# Patient Record
Sex: Female | Born: 1950
Health system: Southern US, Community
[De-identification: ages and names within clinical notes are randomized; demographics above are authoritative.]

## PROBLEM LIST (undated history)

## (undated) DIAGNOSIS — F419 Anxiety disorder, unspecified: Secondary | ICD-10-CM

## (undated) DIAGNOSIS — J309 Allergic rhinitis, unspecified: Secondary | ICD-10-CM

## (undated) DIAGNOSIS — J45909 Unspecified asthma, uncomplicated: Secondary | ICD-10-CM

## (undated) DIAGNOSIS — D219 Benign neoplasm of connective and other soft tissue, unspecified: Secondary | ICD-10-CM

## (undated) DIAGNOSIS — Z86010 Personal history of colonic polyps: Principal | ICD-10-CM

## (undated) DIAGNOSIS — J302 Other seasonal allergic rhinitis: Secondary | ICD-10-CM

## (undated) DIAGNOSIS — M1990 Unspecified osteoarthritis, unspecified site: Secondary | ICD-10-CM

## (undated) DIAGNOSIS — R7303 Prediabetes: Secondary | ICD-10-CM

## (undated) DIAGNOSIS — Z9889 Other specified postprocedural states: Secondary | ICD-10-CM

## (undated) DIAGNOSIS — T7840XA Allergy, unspecified, initial encounter: Secondary | ICD-10-CM

## (undated) DIAGNOSIS — I10 Essential (primary) hypertension: Secondary | ICD-10-CM

## (undated) HISTORY — DX: Other seasonal allergic rhinitis: J30.2

## (undated) HISTORY — DX: Personal history of colonic polyps: Z86.010

## (undated) HISTORY — DX: Allergic rhinitis, unspecified: J30.9

## (undated) HISTORY — DX: Essential (primary) hypertension: I10

## (undated) HISTORY — PX: BREAST SURGERY: SHX581

## (undated) HISTORY — PX: TOE SURGERY: SHX1073

## (undated) HISTORY — PX: JOINT REPLACEMENT: SHX530

## (undated) HISTORY — DX: Allergy, unspecified, initial encounter: T78.40XA

## (undated) HISTORY — DX: Benign neoplasm of connective and other soft tissue, unspecified: D21.9

## (undated) HISTORY — PX: CYSTECTOMY: SUR359

---

## 1981-11-14 HISTORY — PX: BREAST EXCISIONAL BIOPSY: SUR124

## 1985-11-14 HISTORY — PX: BREAST EXCISIONAL BIOPSY: SUR124

## 1994-11-14 HISTORY — PX: TUBAL LIGATION: SHX77

## 1994-11-14 HISTORY — PX: ABDOMINAL HYSTERECTOMY: SHX81

## 1999-01-14 ENCOUNTER — Other Ambulatory Visit: Admission: RE | Admit: 1999-01-14 | Discharge: 1999-01-14 | Payer: Self-pay | Admitting: Obstetrics and Gynecology

## 1999-01-28 ENCOUNTER — Encounter: Payer: Self-pay | Admitting: Obstetrics and Gynecology

## 1999-01-28 ENCOUNTER — Ambulatory Visit (HOSPITAL_COMMUNITY): Admission: RE | Admit: 1999-01-28 | Discharge: 1999-01-28 | Payer: Self-pay | Admitting: Obstetrics and Gynecology

## 1999-02-18 ENCOUNTER — Ambulatory Visit (HOSPITAL_COMMUNITY): Admission: RE | Admit: 1999-02-18 | Discharge: 1999-02-18 | Payer: Self-pay

## 2000-02-28 ENCOUNTER — Other Ambulatory Visit: Admission: RE | Admit: 2000-02-28 | Discharge: 2000-02-28 | Payer: Self-pay | Admitting: Obstetrics and Gynecology

## 2000-02-29 ENCOUNTER — Ambulatory Visit (HOSPITAL_COMMUNITY): Admission: RE | Admit: 2000-02-29 | Discharge: 2000-02-29 | Payer: Self-pay | Admitting: Obstetrics and Gynecology

## 2000-02-29 ENCOUNTER — Encounter: Payer: Self-pay | Admitting: Obstetrics and Gynecology

## 2001-02-28 ENCOUNTER — Other Ambulatory Visit: Admission: RE | Admit: 2001-02-28 | Discharge: 2001-02-28 | Payer: Self-pay | Admitting: Obstetrics and Gynecology

## 2001-03-06 ENCOUNTER — Ambulatory Visit (HOSPITAL_COMMUNITY): Admission: RE | Admit: 2001-03-06 | Discharge: 2001-03-06 | Payer: Self-pay | Admitting: Obstetrics and Gynecology

## 2001-03-06 ENCOUNTER — Encounter: Payer: Self-pay | Admitting: Obstetrics and Gynecology

## 2002-09-09 ENCOUNTER — Other Ambulatory Visit: Admission: RE | Admit: 2002-09-09 | Discharge: 2002-09-09 | Payer: Self-pay | Admitting: Obstetrics and Gynecology

## 2002-09-23 ENCOUNTER — Encounter: Payer: Self-pay | Admitting: Obstetrics and Gynecology

## 2002-09-23 ENCOUNTER — Ambulatory Visit (HOSPITAL_COMMUNITY): Admission: RE | Admit: 2002-09-23 | Discharge: 2002-09-23 | Payer: Self-pay | Admitting: *Deleted

## 2003-12-02 ENCOUNTER — Ambulatory Visit (HOSPITAL_COMMUNITY): Admission: RE | Admit: 2003-12-02 | Discharge: 2003-12-02 | Payer: Self-pay | Admitting: Obstetrics and Gynecology

## 2004-05-28 ENCOUNTER — Other Ambulatory Visit: Admission: RE | Admit: 2004-05-28 | Discharge: 2004-05-28 | Payer: Self-pay | Admitting: Obstetrics and Gynecology

## 2005-02-07 ENCOUNTER — Ambulatory Visit: Payer: Self-pay | Admitting: Internal Medicine

## 2005-02-14 ENCOUNTER — Ambulatory Visit: Payer: Self-pay | Admitting: Internal Medicine

## 2005-02-23 ENCOUNTER — Ambulatory Visit: Payer: Self-pay | Admitting: Internal Medicine

## 2005-03-04 ENCOUNTER — Ambulatory Visit (HOSPITAL_COMMUNITY): Admission: RE | Admit: 2005-03-04 | Discharge: 2005-03-04 | Payer: Self-pay | Admitting: Obstetrics and Gynecology

## 2005-03-16 ENCOUNTER — Ambulatory Visit: Payer: Self-pay | Admitting: Internal Medicine

## 2005-03-23 ENCOUNTER — Ambulatory Visit: Payer: Self-pay | Admitting: Internal Medicine

## 2005-03-30 ENCOUNTER — Encounter (INDEPENDENT_AMBULATORY_CARE_PROVIDER_SITE_OTHER): Payer: Self-pay | Admitting: Specialist

## 2005-03-30 ENCOUNTER — Ambulatory Visit: Payer: Self-pay | Admitting: Internal Medicine

## 2005-06-15 ENCOUNTER — Other Ambulatory Visit: Admission: RE | Admit: 2005-06-15 | Discharge: 2005-06-15 | Payer: Self-pay | Admitting: Addiction Medicine

## 2005-06-20 ENCOUNTER — Ambulatory Visit: Payer: Self-pay | Admitting: Internal Medicine

## 2005-07-14 ENCOUNTER — Ambulatory Visit: Payer: Self-pay | Admitting: Internal Medicine

## 2005-12-13 ENCOUNTER — Ambulatory Visit: Payer: Self-pay | Admitting: Internal Medicine

## 2006-01-26 ENCOUNTER — Ambulatory Visit: Payer: Self-pay | Admitting: Internal Medicine

## 2006-01-29 ENCOUNTER — Encounter: Admission: RE | Admit: 2006-01-29 | Discharge: 2006-01-29 | Payer: Self-pay | Admitting: Internal Medicine

## 2006-02-02 ENCOUNTER — Ambulatory Visit: Payer: Self-pay | Admitting: Internal Medicine

## 2006-02-16 ENCOUNTER — Encounter: Admission: RE | Admit: 2006-02-16 | Discharge: 2006-04-14 | Payer: Self-pay | Admitting: Internal Medicine

## 2006-03-06 ENCOUNTER — Ambulatory Visit (HOSPITAL_COMMUNITY): Admission: RE | Admit: 2006-03-06 | Discharge: 2006-03-06 | Payer: Self-pay | Admitting: Obstetrics and Gynecology

## 2006-03-27 ENCOUNTER — Ambulatory Visit: Payer: Self-pay | Admitting: Internal Medicine

## 2006-06-23 ENCOUNTER — Ambulatory Visit: Payer: Self-pay | Admitting: Internal Medicine

## 2006-07-06 ENCOUNTER — Other Ambulatory Visit: Admission: RE | Admit: 2006-07-06 | Discharge: 2006-07-06 | Payer: Self-pay | Admitting: Obstetrics and Gynecology

## 2006-09-14 ENCOUNTER — Ambulatory Visit: Payer: Self-pay | Admitting: Internal Medicine

## 2006-09-14 DIAGNOSIS — J309 Allergic rhinitis, unspecified: Secondary | ICD-10-CM | POA: Insufficient documentation

## 2006-09-14 HISTORY — DX: Allergic rhinitis, unspecified: J30.9

## 2006-10-20 ENCOUNTER — Ambulatory Visit: Payer: Self-pay | Admitting: Internal Medicine

## 2006-10-20 LAB — CONVERTED CEMR LAB
ALT: 20 units/L (ref 0–40)
AST: 22 units/L (ref 0–37)
Albumin: 3.7 g/dL (ref 3.5–5.2)
Alkaline Phosphatase: 91 units/L (ref 39–117)
BUN: 6 mg/dL (ref 6–23)
Basophils Absolute: 0 10*3/uL (ref 0.0–0.1)
Basophils Relative: 0.3 % (ref 0.0–1.0)
CO2: 27 meq/L (ref 19–32)
Calcium: 10.1 mg/dL (ref 8.4–10.5)
Chloride: 107 meq/L (ref 96–112)
Chol/HDL Ratio, serum: 2.3
Cholesterol: 147 mg/dL (ref 0–200)
Creatinine, Ser: 0.8 mg/dL (ref 0.4–1.2)
Eosinophil percent: 2.6 % (ref 0.0–5.0)
GFR calc non Af Amer: 79 mL/min
Glomerular Filtration Rate, Af Am: 96 mL/min/{1.73_m2}
Glucose, Bld: 113 mg/dL — ABNORMAL HIGH (ref 70–99)
HCT: 41.1 % (ref 36.0–46.0)
HDL: 63.5 mg/dL (ref >39.0)
Hemoglobin: 13.8 g/dL (ref 12.0–15.0)
Hgb A1c MFr Bld: 6.5 % — ABNORMAL HIGH (ref 4.6–6.0)
LDL Cholesterol: 68 mg/dL (ref 0–99)
Lymphocytes Relative: 43.2 % (ref 12.0–46.0)
MCHC: 33.5 g/dL (ref 30.0–36.0)
MCV: 91.2 fL (ref 78.0–100.0)
Monocytes Absolute: 0.5 10*3/uL (ref 0.2–0.7)
Monocytes Relative: 8.4 % (ref 3.0–11.0)
Neutro Abs: 2.6 10*3/uL (ref 1.4–7.7)
Neutrophils Relative %: 45.5 % (ref 43.0–77.0)
Platelets: 330 10*3/uL (ref 150–400)
Potassium: 3.8 meq/L (ref 3.5–5.1)
RBC: 4.51 M/uL (ref 3.87–5.11)
RDW: 12.6 % (ref 11.5–14.6)
Sodium: 142 meq/L (ref 135–145)
TSH: 2.15 microintl units/mL (ref 0.35–5.50)
Total Bilirubin: 0.7 mg/dL (ref 0.3–1.2)
Total Protein: 6.6 g/dL (ref 6.0–8.3)
Triglyceride fasting, serum: 76 mg/dL (ref 0–149)
VLDL: 15 mg/dL (ref 0–40)
WBC: 5.6 10*3/uL (ref 4.5–10.5)

## 2006-11-01 ENCOUNTER — Ambulatory Visit: Payer: Self-pay | Admitting: Internal Medicine

## 2006-11-10 ENCOUNTER — Ambulatory Visit: Payer: Self-pay | Admitting: Internal Medicine

## 2006-11-14 HISTORY — PX: CARPAL TUNNEL RELEASE: SHX101

## 2006-11-22 ENCOUNTER — Encounter: Admission: RE | Admit: 2006-11-22 | Discharge: 2007-02-20 | Payer: Self-pay | Admitting: Internal Medicine

## 2007-01-31 ENCOUNTER — Ambulatory Visit: Payer: Self-pay | Admitting: Internal Medicine

## 2007-01-31 LAB — CONVERTED CEMR LAB
ALT: 13 units/L (ref 0–40)
AST: 18 units/L (ref 0–37)
Albumin: 3.6 g/dL (ref 3.5–5.2)
Alkaline Phosphatase: 87 units/L (ref 39–117)
BUN: 13 mg/dL (ref 6–23)
Bilirubin, Direct: 0.1 mg/dL (ref 0.0–0.3)
CO2: 30 meq/L (ref 19–32)
Calcium: 9.6 mg/dL (ref 8.4–10.5)
Chloride: 114 meq/L — ABNORMAL HIGH (ref 96–112)
Cholesterol: 141 mg/dL (ref 0–200)
Creatinine, Ser: 0.8 mg/dL (ref 0.4–1.2)
GFR calc Af Amer: 96 mL/min
GFR calc non Af Amer: 79 mL/min
Glucose, Bld: 118 mg/dL — ABNORMAL HIGH (ref 70–99)
HDL: 65.2 mg/dL (ref >39.0)
Hgb A1c MFr Bld: 6.4 % — ABNORMAL HIGH (ref 4.6–6.0)
LDL Cholesterol: 58 mg/dL (ref 0–99)
Potassium: 3.9 meq/L (ref 3.5–5.1)
Sodium: 149 meq/L — ABNORMAL HIGH (ref 135–145)
Total Bilirubin: 0.5 mg/dL (ref 0.3–1.2)
Total CHOL/HDL Ratio: 2.2
Total Protein: 6.5 g/dL (ref 6.0–8.3)
Triglycerides: 90 mg/dL (ref 0–149)
VLDL: 18 mg/dL (ref 0–40)

## 2007-03-08 ENCOUNTER — Ambulatory Visit (HOSPITAL_COMMUNITY): Admission: RE | Admit: 2007-03-08 | Discharge: 2007-03-08 | Payer: Self-pay | Admitting: Obstetrics and Gynecology

## 2007-06-15 ENCOUNTER — Telehealth (INDEPENDENT_AMBULATORY_CARE_PROVIDER_SITE_OTHER): Payer: Self-pay | Admitting: *Deleted

## 2007-08-13 ENCOUNTER — Other Ambulatory Visit: Admission: RE | Admit: 2007-08-13 | Discharge: 2007-08-13 | Payer: Self-pay | Admitting: Obstetrics and Gynecology

## 2007-09-07 ENCOUNTER — Ambulatory Visit: Payer: Self-pay | Admitting: Internal Medicine

## 2007-09-10 ENCOUNTER — Ambulatory Visit: Payer: Self-pay | Admitting: Internal Medicine

## 2008-03-10 ENCOUNTER — Ambulatory Visit (HOSPITAL_COMMUNITY): Admission: RE | Admit: 2008-03-10 | Discharge: 2008-03-10 | Payer: Self-pay | Admitting: Obstetrics and Gynecology

## 2008-05-23 ENCOUNTER — Ambulatory Visit: Payer: Self-pay | Admitting: Internal Medicine

## 2008-05-26 ENCOUNTER — Ambulatory Visit: Payer: Self-pay | Admitting: Internal Medicine

## 2008-05-26 DIAGNOSIS — M25569 Pain in unspecified knee: Secondary | ICD-10-CM | POA: Insufficient documentation

## 2008-05-29 ENCOUNTER — Encounter: Payer: Self-pay | Admitting: Internal Medicine

## 2008-06-06 ENCOUNTER — Telehealth (INDEPENDENT_AMBULATORY_CARE_PROVIDER_SITE_OTHER): Payer: Self-pay | Admitting: *Deleted

## 2008-07-18 ENCOUNTER — Ambulatory Visit (HOSPITAL_BASED_OUTPATIENT_CLINIC_OR_DEPARTMENT_OTHER): Admission: RE | Admit: 2008-07-18 | Discharge: 2008-07-18 | Payer: Self-pay | Admitting: Specialist

## 2008-08-20 ENCOUNTER — Ambulatory Visit: Payer: Self-pay | Admitting: Obstetrics and Gynecology

## 2008-08-20 ENCOUNTER — Encounter: Payer: Self-pay | Admitting: Obstetrics and Gynecology

## 2008-08-20 ENCOUNTER — Other Ambulatory Visit: Admission: RE | Admit: 2008-08-20 | Discharge: 2008-08-20 | Payer: Self-pay | Admitting: Obstetrics and Gynecology

## 2008-08-20 ENCOUNTER — Encounter: Payer: Self-pay | Admitting: Internal Medicine

## 2008-08-26 ENCOUNTER — Encounter: Payer: Self-pay | Admitting: Internal Medicine

## 2008-08-27 ENCOUNTER — Ambulatory Visit: Payer: Self-pay | Admitting: Obstetrics and Gynecology

## 2008-09-03 ENCOUNTER — Telehealth: Payer: Self-pay | Admitting: Internal Medicine

## 2008-11-14 HISTORY — PX: KNEE ARTHROSCOPY: SUR90

## 2008-11-14 LAB — HM MAMMOGRAPHY: HM Mammogram: NORMAL

## 2008-12-17 ENCOUNTER — Encounter: Payer: Self-pay | Admitting: Internal Medicine

## 2009-03-11 ENCOUNTER — Ambulatory Visit (HOSPITAL_COMMUNITY): Admission: RE | Admit: 2009-03-11 | Discharge: 2009-03-11 | Payer: Self-pay | Admitting: Obstetrics and Gynecology

## 2009-03-13 ENCOUNTER — Ambulatory Visit: Payer: Self-pay | Admitting: Internal Medicine

## 2009-03-17 ENCOUNTER — Telehealth (INDEPENDENT_AMBULATORY_CARE_PROVIDER_SITE_OTHER): Payer: Self-pay | Admitting: *Deleted

## 2009-08-06 ENCOUNTER — Ambulatory Visit: Payer: Self-pay | Admitting: Obstetrics and Gynecology

## 2009-08-28 ENCOUNTER — Telehealth: Payer: Self-pay | Admitting: Internal Medicine

## 2009-08-28 ENCOUNTER — Other Ambulatory Visit: Admission: RE | Admit: 2009-08-28 | Discharge: 2009-08-28 | Payer: Self-pay | Admitting: Obstetrics and Gynecology

## 2009-08-28 ENCOUNTER — Ambulatory Visit: Payer: Self-pay | Admitting: Obstetrics and Gynecology

## 2009-08-28 ENCOUNTER — Encounter: Payer: Self-pay | Admitting: Obstetrics and Gynecology

## 2009-09-02 ENCOUNTER — Ambulatory Visit: Payer: Self-pay | Admitting: Obstetrics and Gynecology

## 2009-11-03 ENCOUNTER — Ambulatory Visit: Payer: Self-pay | Admitting: Internal Medicine

## 2009-11-14 HISTORY — PX: TOTAL KNEE ARTHROPLASTY: SHX125

## 2010-01-08 ENCOUNTER — Telehealth: Payer: Self-pay | Admitting: Internal Medicine

## 2010-03-12 ENCOUNTER — Ambulatory Visit (HOSPITAL_COMMUNITY): Admission: RE | Admit: 2010-03-12 | Discharge: 2010-03-12 | Payer: Self-pay | Admitting: Obstetrics and Gynecology

## 2010-04-01 ENCOUNTER — Encounter (INDEPENDENT_AMBULATORY_CARE_PROVIDER_SITE_OTHER): Payer: Self-pay | Admitting: *Deleted

## 2010-05-21 ENCOUNTER — Telehealth: Payer: Self-pay | Admitting: Internal Medicine

## 2010-05-22 ENCOUNTER — Ambulatory Visit: Payer: Self-pay | Admitting: Family Medicine

## 2010-06-28 ENCOUNTER — Telehealth (INDEPENDENT_AMBULATORY_CARE_PROVIDER_SITE_OTHER): Payer: Self-pay | Admitting: *Deleted

## 2010-08-06 ENCOUNTER — Ambulatory Visit: Payer: Self-pay | Admitting: Internal Medicine

## 2010-08-06 LAB — CONVERTED CEMR LAB
Albumin: 3.8 g/dL (ref 3.5–5.2)
Alkaline Phosphatase: 65 units/L (ref 39–117)
Basophils Absolute: 0 10*3/uL (ref 0.0–0.1)
Bilirubin Urine: NEGATIVE
CO2: 28 meq/L (ref 19–32)
Calcium: 9.5 mg/dL (ref 8.4–10.5)
Cholesterol: 161 mg/dL (ref 0–200)
Creatinine, Ser: 0.7 mg/dL (ref 0.4–1.2)
Eosinophils Absolute: 0.6 10*3/uL (ref 0.0–0.7)
Glucose, Bld: 83 mg/dL (ref 70–99)
HDL: 71.9 mg/dL (ref >39.00)
Hemoglobin: 13.5 g/dL (ref 12.0–15.0)
Ketones, ur: NEGATIVE mg/dL
Lymphocytes Relative: 37.2 % (ref 12.0–46.0)
MCHC: 34.3 g/dL (ref 30.0–36.0)
Monocytes Absolute: 0.4 10*3/uL (ref 0.1–1.0)
Neutro Abs: 3.2 10*3/uL (ref 1.4–7.7)
RDW: 14 % (ref 11.5–14.6)
TSH: 2.1 microintl units/mL (ref 0.35–5.50)
Total CHOL/HDL Ratio: 2
Total Protein, Urine: NEGATIVE mg/dL
Triglycerides: 45 mg/dL (ref 0.0–149.0)
Urine Glucose: NEGATIVE mg/dL
pH: 5.5 (ref 5.0–8.0)

## 2010-08-16 ENCOUNTER — Encounter: Payer: Self-pay | Admitting: Internal Medicine

## 2010-08-16 ENCOUNTER — Ambulatory Visit: Payer: Self-pay | Admitting: Internal Medicine

## 2010-09-02 ENCOUNTER — Ambulatory Visit: Payer: Self-pay | Admitting: Obstetrics and Gynecology

## 2010-09-02 ENCOUNTER — Other Ambulatory Visit: Admission: RE | Admit: 2010-09-02 | Discharge: 2010-09-02 | Payer: Self-pay | Admitting: Obstetrics and Gynecology

## 2010-09-14 ENCOUNTER — Inpatient Hospital Stay (HOSPITAL_COMMUNITY): Admission: RE | Admit: 2010-09-14 | Discharge: 2010-09-17 | Payer: Self-pay | Admitting: Orthopedic Surgery

## 2010-09-20 ENCOUNTER — Encounter: Payer: Self-pay | Admitting: Internal Medicine

## 2010-12-14 NOTE — Progress Notes (Signed)
  Phone Note Other Incoming   Request: Send information Summary of Call: Request for records received from [facility]. Request forwarded to [facility].     

## 2010-12-14 NOTE — Assessment & Plan Note (Signed)
Summary: CPX/CJR   Vital Signs:  Patient profile:   60 year old female Height:      44.75 inches Weight:      168 pounds BMI:     59.20 Temp:     99.1 degrees F oral BP sitting:   138 / 92  Vitals Entered By: Kathrynn Speed CMA (August 16, 2010 8:11 AM) CC: cpx, src Is Patient Diabetic? No   Primary Care Provider:  [facility]  CC:  cpx and src.  History of Present Illness: CPX  Preventive Screening-Counseling & Management  Alcohol-Tobacco     Smoking Status: current     Packs/Day: <0.25  Current Problems (verified): 1)  Knee Pain, Right  (ICD-719.46) 2)  Well Adult Exam  (ICD-V70.0) 3)  Allergic Rhinitis  (ICD-477.9)  Current Medications (verified): 1)  Colace 100 Mg  Caps (Docusate Sodium) .... Qd 2)  Womens Multivitamin Plus   Tabs (Multiple Vitamins-Minerals) .... Qd 3)  Mobic 15 Mg Tabs (Meloxicam) .... 1/2 -1 By Mouth Once Daily As Needed 4)  Claritin 10 Mg Tabs (Loratadine) .Marland Kitchen.. 1 By Mouth Once Daily 5)  Th Cod Liver Oil  Caps (Cod Liver Oil) .... Once Daily  Allergies (verified): 1)  ! * Refuses Prednisone  Past History:  Past Medical History: Last updated: 09/14/2006 Allergic rhinitis Viral hepatitis 1985 Smoker  Past Surgical History: Last updated: 03/13/2009 Hysterectomy knee arthroscopy breast biopsy Carpal tunnel release toe surgery  Social History: Last updated: 09/07/2007 Current Smoker Regular exercise-no  Risk Factors: Exercise: no (09/07/2007)  Risk Factors: Smoking Status: current (08/16/2010) Packs/Day: <0.25 (08/16/2010)   Orders Added: 1)  EKG w/ Interpretation [93000] 2)  Est. Patient 40-64 years [99396] 3)  Admin 1st Vaccine [90471] 4)  Flu Vaccine 35yrs + [[address]]   Impression & Recommendations:  Problem # 1:  WELL ADULT EXAM (ICD-V70.0)  health maint UTD advised daily exercise and modestweight loss.   Orders: EKG w/ Interpretation (93000)  Problem # 2:  KNEE PAIN, RIGHT (ICD-719.46)  secondary to endstage  OA ok for surgery ekg done today Her updated medication list for this problem includes:    Mobic 15 Mg Tabs (Meloxicam) .Marland Kitchen... 1/2 -1 by mouth once daily as needed  Orders: EKG w/ Interpretation (93000) Flu Vaccine Consent Questions     Do you have a history of severe allergic reactions to this vaccine? no    Any prior history of allergic reactions to egg and/or gelatin? no    Do you have a sensitivity to the preservative Thimersol? no    Do you have a past history of Guillan-Barre Syndrome? no    Do you currently have an acute febrile illness? no    Have you ever had a severe reaction to latex? no    Vaccine information given and explained to patient? yes    Are you currently pregnant? no    Lot Number:[id number]   Exp Date:05/14/2011   Site Given  Left Deltoid IM  Complete Medication List: 1)  Colace 100 Mg Caps (Docusate sodium) .... Qd 2)  Womens Multivitamin Plus Tabs (Multiple vitamins-minerals) .... Qd 3)  Mobic 15 Mg Tabs (Meloxicam) .... 1/2 -1 by mouth once daily as needed 4)  Claritin 10 Mg Tabs (Loratadine) .Marland Kitchen.. 1 by mouth once daily 5)  Th Cod Liver Oil Caps (Cod liver oil) .... Once daily  Other Orders: Admin 1st Vaccine (60454) Flu Vaccine 64yrs + (09811)    Physical Exam General Appearance: well developed, well nourished, no acute  distress Eyes: conjunctiva and lids normal, PERRL, EOMI,  Ears, Nose, Mouth, Throat: TM clear, nares clear, oral exam WNL Neck: supple, no lymphadenopathy, no thyromegaly, no JVD Respiratory: clear to auscultation and percussion, respiratory effort normal Cardiovascular: regular rate and rhythm, S1-S2, no murmur, rub or gallop, no bruits, peripheral pulses normal and symmetric, no cyanosis, clubbing, edema or varicosities Chest: no scars, masses, tenderness; no asymmetry, skin changes, nipple discharge   Gastrointestinal: soft, non-tender; no hepatosplenomegaly, masses; active bowel sounds all quadrants, Lymphatic: no cervical,  axillary or inguinal adenopathy Musculoskeletal: gait normal, muscle tone and strength WNL, no joint swelling, effusions, discoloration, crepitus  Skin: clear, good turgor, color WNL, no rashes, lesions, or ulcerations Neurologic: normal mental status, normal reflexes, normal strength, sensation, and motion Psychiatric: alert; oriented to person, place and time Other Exam:

## 2010-12-14 NOTE — Letter (Signed)
Summary: Colonoscopy Date Change Letter  [facility]  [address] [place], [place] [address]   Phone: [phone]  Fax: [phone]      Apr 01, 2010 MRN: [id number]   Debra Woodward [address] [place], [place]  [address]   Dear Debra Woodward,   Previously you were recommended to have a repeat colonoscopy around this time. Your chart was recently reviewed by Dr. Iva Boop of [facility]. Follow up colonoscopy is now recommended in May 2016. This revised recommendation is based on current, nationally recognized guidelines for colorectal cancer screening and polyp surveillance. These guidelines are endorsed by the American Cancer Society, [employer] on Colorectal Cancer as well as numerous other major medical organizations.  Please understand that our recommendation assumes that you do not have any new symptoms such as bleeding, a change in bowel habits, anemia, or significant abdominal discomfort. If you do have any concerning GI symptoms or want to discuss the guideline recommendations, please call to arrange an office visit at your earliest convenience. Otherwise we will keep you in our reminder system and contact you 1-2 months prior to the date listed above to schedule your next colonoscopy.  Thank you,  Iva Boop, M.D.  [facility] [phone]

## 2010-12-14 NOTE — Assessment & Plan Note (Signed)
Summary: rash/njr   Vital Signs:  Patient profile:   60 year old female Height:      64.5 inches Weight:      167 pounds BMI:     28.32 O2 Sat:      95 % on Room air Pulse rate:   91 / minute BP sitting:   136 / 90  (left arm) Cuff size:   regular  Vitals Entered By: Payton Spark CMA (May 22, 2010 9:15 AM)  O2 Flow:  Room air CC: R knee warm and painful. Also c/o itchy rash.   History of Present Illness: Pt c/o rash for 1 week that started on L arm and L shoulder and to R breast.  It was very itchy  It has almost totally resolved but R breast---is still itchy.  Pt with chronic R Knee pain and sees Dr Shelle Iron for it but it has been bothering her more over the last couple of weeks.  Pt states it did feel warmer to her.   Today is not as bad.     Current Medications (verified): 1)  Colace 100 Mg  Caps (Docusate Sodium) .... Qd 2)  Womens Multivitamin Plus   Tabs (Multiple Vitamins-Minerals) .... Qd  Allergies (verified): 1)  ! * Refuses Prednisone  Physical Exam  General:  Well-developed,well-nourished,in no acute distress; alert,appropriate and cooperative throughout examination Msk:  R knee --- + swelling, ? effusion no pain with palpation no signs infection Skin:  few papules -- healing under R breast other papules resolved per pt Cervical Nodes:  No lymphadenopathy noted Psych:  Oriented X3 and normally interactive.     Impression & Recommendations:  Problem # 1:  SKIN RASH (ICD-782.1) pt did not want cortisone cream she will con't with claritn and corn starch and call if no better Discussed medication use and symptom control.   Problem # 2:  KNEE PAIN, RIGHT (ICD-719.46)  Her updated medication list for this problem includes:    Mobic 15 Mg Tabs (Meloxicam) .Marland Kitchen... 1/2 -1 by mouth once daily as needed  Discussed strengthening exercises, use of ice or heat, and medications.   Complete Medication List: 1)  Colace 100 Mg Caps (Docusate sodium) .... Qd 2)   Womens Multivitamin Plus Tabs (Multiple vitamins-minerals) .... Qd 3)  Mobic 15 Mg Tabs (Meloxicam) .... 1/2 -1 by mouth once daily as needed 4)  Claritin 10 Mg Tabs (Loratadine) .Marland Kitchen.. 1 by mouth once daily  Patient Instructions: 1)  f/u with PCP if no better by Monday 2)  If knee gets red or hot or more painful go to ER or UC 3)  F/u with ortho for your knees                                                                                                                                          Prescriptions: MOBIC 15 MG TABS (  MELOXICAM) 1/2 -1 by mouth once daily as needed  #30 x 2   Entered and Authorized by:   Loreen Freud DO   Signed by:   Loreen Freud DO on 05/22/2010   Method used:   Electronically to        [facility] [address] (retail)       [phone]       [place], [place]  [address]       Ph: [phone]       Fax: [phone]   RxID:   [id number]

## 2010-12-14 NOTE — Progress Notes (Signed)
Summary: LMTCB  Phone Note Call from Patient   Caller: Patient Call For: Birdie Sons MD Summary of Call: Requesting call back. Voice mail.  No message. [phone] Initial call taken by: Lynann Beaver CMA,  May 21, 2010 3:00 PM  Follow-up for Phone Call        [facility]  [facility] again\par Lynann Beaver [facility]  May 24, 2010 3:14 PM  Follow-up by: Lynann Beaver CMA,  May 21, 2010 3:01 PM  Additional Follow-up for Phone Call Additional follow up Details #1::        Pt was seen Saturday, and does not need appt with [place] right now. Additional Follow-up by: Lynann Beaver CMA,  May 24, 2010 4:28 PM

## 2010-12-14 NOTE — Progress Notes (Signed)
Summary: permament sticker  [facility] 01/11/2010  Phone Note Call from Patient   Caller: Patient Call For: Birdie Sons MD Summary of Call: Pt wants to know if she brings in a permanent handicapped sticker if Dr. Cato Mulligan will approve it?  She has an temporary sticker now. Initial call taken by: Lynann Beaver CMA,  January 08, 2010 9:06 AM  Follow-up for Phone Call        what is the indication? Follow-up by: Birdie Sons MD,  January 11, 2010 6:58 AM  Additional Follow-up for Phone Call Additional follow up Details #1::        LMTCB Additional Follow-up by: Lynann Beaver CMA,  January 11, 2010 11:00 AM    Additional Follow-up for Phone Call Additional follow up Details #2::    pt returniong a call.......[phone] Follow-up by: Warnell Forester,  January 12, 2010 12:14 PM  Additional Follow-up for Phone Call Additional follow up Details #3:: Details for Additional Follow-up Action Taken: Left message on machine. Pt to call back. Pt to leave indication for this on my voice mail.Gladis Riffle, RN  January 12, 2010 4:58 PM   Pt states needs due to knee you took fluid off in Dec.  won't qualify Birdie Sons MD  January 13, 2010 2:28 PM  Additional Follow-up by: Gladis Riffle, RN,  January 13, 2010 9:35 AM    Patient notified.

## 2010-12-28 ENCOUNTER — Encounter: Payer: Self-pay | Admitting: Internal Medicine

## 2010-12-28 ENCOUNTER — Ambulatory Visit (INDEPENDENT_AMBULATORY_CARE_PROVIDER_SITE_OTHER): Payer: BC Managed Care – PPO | Admitting: Internal Medicine

## 2010-12-28 VITALS — BP 132/84 | HR 76 | Temp 98.6°F | Ht 64.5 in | Wt 170.0 lb

## 2010-12-28 DIAGNOSIS — R42 Dizziness and giddiness: Secondary | ICD-10-CM

## 2010-12-28 MED ORDER — MECLIZINE HCL 25 MG PO TABS: 25.0000 mg | ORAL_TABLET | Freq: Three times a day (TID) | ORAL | Status: DC | PRN

## 2010-12-28 NOTE — Progress Notes (Signed)
  Subjective:    Patient ID: Debra Woodward, female    DOB: [birth year], 60 y.o.   MRN: [id number]  HPI  Pt complains of dizziness. Only occurs when she changes head position; leaning over or turning head rapidly. Duration 3 days. No fever  Or other constitiutional sxs. No neurologic sxs. sxs can be severe, minimal nausea.  She did take her bp a few times after the dizziness, bp ranged from 150-168/85-104  Review of Systems  patient denies chest pain, shortness of breath, orthopnea. Denies lower extremity edema, abdominal pain, change in appetite, change in bowel movements. Patient denies rashes, musculoskeletal complaints. No other specific complaints in a complete review of systems.    Past Medical History  Diagnosis Date  . ALLERGIC RHINITIS 09/14/2006   Past Surgical History  Procedure Date  . Abdominal hysterectomy   . Knee arthroscopy   . Breast surgery   . Carpal tunnel release   . Toe surgery     reports that she quit smoking about 3 months ago. She does not have any smokeless tobacco history on file. She reports that she drinks about .6 ounces of alcohol per week. Her drug history not on file. family history includes Diabetes in her father; Hypertension in her sister; and Stroke in her mother.        Objective:   Physical Exam   Well-developed well-nourished female in no acute distress. HEENT exam atraumatic, normocephalic, extraocular muscles are intact, no nystagmus Neck is supple. No jugular venous distention no thyromegaly. Chest clear to auscultation without increased work of breathing. Cardiac exam S1 and S2 are regular. Abdominal exam active bowel sounds, soft, nontender. Extremities no edema. Neurologic exam she is alert without any motor sensory deficits. Gait is normal.          Assessment & Plan:  vertigo: she gives a good definition of vertigo.  I discussed this with her. Discussed treatment plans. We'll try meclizine. Side effects discussed. She will call  me if symptoms persist and may need to refer to physical therapy.

## 2011-01-06 ENCOUNTER — Telehealth: Payer: Self-pay | Admitting: *Deleted

## 2011-01-06 NOTE — Telephone Encounter (Signed)
Pt asked Dr Cato Mulligan for a permanent handicapp sticker and he told her to as [employer].  They told her they only do temporary and she needed to go to her PCP for permanent

## 2011-01-25 LAB — CBC
MCH: 32 pg (ref 26.0–34.0)
MCHC: 34.6 g/dL (ref 30.0–36.0)
MCV: 92.7 fL (ref 78.0–100.0)
Platelets: 217 10*3/uL (ref 150–400)
Platelets: 244 10*3/uL (ref 150–400)
RBC: 3.15 MIL/uL — ABNORMAL LOW (ref 3.87–5.11)
RBC: 3.38 MIL/uL — ABNORMAL LOW (ref 3.87–5.11)
RDW: 13.3 % (ref 11.5–15.5)
RDW: 13.4 % (ref 11.5–15.5)
WBC: 11.5 10*3/uL — ABNORMAL HIGH (ref 4.0–10.5)

## 2011-01-25 LAB — BASIC METABOLIC PANEL
BUN: 5 mg/dL — ABNORMAL LOW (ref 6–23)
BUN: 5 mg/dL — ABNORMAL LOW (ref 6–23)
CO2: 24 mEq/L (ref 19–32)
Calcium: 8.9 mg/dL (ref 8.4–10.5)
Calcium: 9 mg/dL (ref 8.4–10.5)
Chloride: 106 mEq/L (ref 96–112)
Creatinine, Ser: 0.66 mg/dL (ref 0.4–1.2)
Creatinine, Ser: 0.72 mg/dL (ref 0.4–1.2)
GFR calc Af Amer: >60 mL/min (ref >60)
GFR calc Af Amer: >60 mL/min (ref >60)
Glucose, Bld: 159 mg/dL — ABNORMAL HIGH (ref 70–99)

## 2011-01-25 LAB — TYPE AND SCREEN
ABO/RH(D): O POS
Antibody Screen: NEGATIVE

## 2011-01-25 LAB — ABO/RH: ABO/RH(D): O POS

## 2011-01-26 LAB — CBC
MCV: 93.7 fL (ref 78.0–100.0)
Platelets: 313 10*3/uL (ref 150–400)
RBC: 4.51 MIL/uL (ref 3.87–5.11)
RDW: 13.3 % (ref 11.5–15.5)
WBC: 6.7 10*3/uL (ref 4.0–10.5)

## 2011-01-26 LAB — BASIC METABOLIC PANEL
BUN: 10 mg/dL (ref 6–23)
Calcium: 9.6 mg/dL (ref 8.4–10.5)
Chloride: 107 mEq/L (ref 96–112)
Creatinine, Ser: 0.8 mg/dL (ref 0.4–1.2)
GFR calc Af Amer: >60 mL/min (ref >60)
GFR calc non Af Amer: >60 mL/min (ref >60)

## 2011-01-26 LAB — DIFFERENTIAL
Basophils Absolute: 0 10*3/uL (ref 0.0–0.1)
Eosinophils Relative: 8 % — ABNORMAL HIGH (ref 0–5)
Lymphocytes Relative: 39 % (ref 12–46)
Lymphs Abs: 2.6 10*3/uL (ref 0.7–4.0)
Neutrophils Relative %: 46 % (ref 43–77)

## 2011-01-26 LAB — URINALYSIS, ROUTINE W REFLEX MICROSCOPIC
Glucose, UA: NEGATIVE mg/dL
Hgb urine dipstick: NEGATIVE
Ketones, ur: NEGATIVE mg/dL
Protein, ur: NEGATIVE mg/dL
Urobilinogen, UA: 1 mg/dL (ref 0.0–1.0)

## 2011-01-26 LAB — SURGICAL PCR SCREEN: Staphylococcus aureus: NEGATIVE

## 2011-01-26 LAB — PROTIME-INR
INR: 0.94 (ref 0.00–1.49)
Prothrombin Time: 12.8 seconds (ref 11.6–15.2)

## 2011-01-26 LAB — URINE MICROSCOPIC-ADD ON

## 2011-02-24 ENCOUNTER — Other Ambulatory Visit: Payer: Self-pay | Admitting: Obstetrics and Gynecology

## 2011-02-24 DIAGNOSIS — Z1231 Encounter for screening mammogram for malignant neoplasm of breast: Secondary | ICD-10-CM

## 2011-03-16 ENCOUNTER — Ambulatory Visit (HOSPITAL_COMMUNITY)
Admission: RE | Admit: 2011-03-16 | Discharge: 2011-03-16 | Disposition: A | Discharge status: Home / Self Care | Payer: BC Managed Care – PPO | Source: Ambulatory Visit | Attending: Obstetrics and Gynecology | Admitting: Obstetrics and Gynecology

## 2011-03-16 DIAGNOSIS — Z1231 Encounter for screening mammogram for malignant neoplasm of breast: Secondary | ICD-10-CM | POA: Insufficient documentation

## 2011-03-17 ENCOUNTER — Other Ambulatory Visit: Payer: Self-pay | Admitting: Obstetrics and Gynecology

## 2011-03-17 DIAGNOSIS — R928 Other abnormal and inconclusive findings on diagnostic imaging of breast: Secondary | ICD-10-CM

## 2011-03-22 ENCOUNTER — Other Ambulatory Visit: Payer: Self-pay | Admitting: Obstetrics and Gynecology

## 2011-03-22 ENCOUNTER — Ambulatory Visit
Admission: RE | Admit: 2011-03-22 | Discharge: 2011-03-22 | Disposition: A | Discharge status: Home / Self Care | Payer: BC Managed Care – PPO | Source: Ambulatory Visit | Attending: Obstetrics and Gynecology | Admitting: Obstetrics and Gynecology

## 2011-03-22 DIAGNOSIS — R921 Mammographic calcification found on diagnostic imaging of breast: Secondary | ICD-10-CM

## 2011-03-22 DIAGNOSIS — R928 Other abnormal and inconclusive findings on diagnostic imaging of breast: Secondary | ICD-10-CM

## 2011-03-29 NOTE — Op Note (Signed)
Debra Woodward, Debra Woodward NO.:  [id number]   MEDICAL RECORD NO.:  [id number]          PATIENT TYPE:  AMB   LOCATION:  [facility]                         FACILITY:  [facility]   PHYSICIAN:  Jene Every, M.D.    DATE OF BIRTH:  17-May-1951   DATE OF PROCEDURE:  07/18/2008  DATE OF DISCHARGE:                               OPERATIVE REPORT   PREOPERATIVE DIAGNOSIS:  Lateral meniscus tear of the right knee.   POSTOPERATIVE DIAGNOSIS:  Lateral meniscus tear of the right knee,  medial meniscus tear, grade III chondromalacia of patella, grade III  chondromalacia of lateral compartment.   PROCEDURE:  Right knee arthroscopy, partial medial and lateral  meniscectomy, chondroplasty of the patella and lateral tibial plateau.   ANESTHESIA:  General.   BRIEF HISTORY AND INDICATION:  A 60 year old with knee pain and lateral  meniscus tear seen on MRI, indicated for arthroscopic debridement and  partial meniscectomy.  Risks and benefits discussed including bleeding,  infection, no change in symptoms, worsening symptoms, need for repeat  debridement, DVT, PE, anesthetic complications, etc.   PROCEDURE IN DETAIL:  With the patient in supine position after the  induction of adequate general anesthesia and 1 gram of Kefzol the right  lower extremity was prepped and draped in the usual sterile fashion.  A  lateral parapatellar portal and a superomedial parapatellar portal was  fashioned with a #11 blade.  The ingress cannula atraumatically placed.  Irrigant was utilized to insufflate the joint.  Under direct  visualization a medial parapatellar portal was fashioned with a #11  blade after localization with an 18 gauge needle sparing the medial  meniscus.   Noted was a posterior third radial tear of the medial meniscus and a  basket rongeurs was utilized to perform a partial medial meniscectomy  and a stable base further contoured with a 4.2 Cuda shaver.  Cartilage  was relatively spared in  that compartment.  Lateral compartment revealed  extensive grade III changes, extensive tearing of the lateral meniscus  from posterior to anterior.  Basket rongeurs was utilized to perform a  partial medial meniscectomy of the inner 50% and it was further  contoured with an ArthroWand and a shaver.  The remnant was then stable  to probe palpation.  There were some grade III-IV changes in the  posterior tibial plateau.  Chondroplasty was performed here.  Next,  patellofemoral joint was examined.  There was normal patellofemoral  tracking.  Grade III chondromalacia patella, chondroplasty performed  here.  The knee was copiously lavaged.  Gutters were examined.  They  were unremarkable.  Reexamined all compartments.  Probed the menisci and  no further pathology amenable to arthroscopic intervention.   Next, all instrumentation was removed.  Portals were closed with 4-0  nylon simple sutures and 0.25% Marcaine with epinephrine was infiltrated  in the joint and the wound was dressed sterilely.  She was awakened  without difficulty and transported to recovery in satisfactory  condition.  The patient tolerated the procedure.  No complications.      Jene Every, M.D.  Electronically Signed  JB/MEDQ  D:  07/18/2008  T:  07/19/2008  Job:  [id number]

## 2011-05-04 ENCOUNTER — Other Ambulatory Visit: Payer: Self-pay | Admitting: Internal Medicine

## 2011-08-15 LAB — HM PAP SMEAR

## 2011-08-17 ENCOUNTER — Other Ambulatory Visit: Payer: Self-pay | Admitting: *Deleted

## 2011-08-17 DIAGNOSIS — R921 Mammographic calcification found on diagnostic imaging of breast: Secondary | ICD-10-CM

## 2011-08-17 LAB — POCT HEMOGLOBIN-HEMACUE: Hemoglobin: 13.4

## 2011-09-28 ENCOUNTER — Ambulatory Visit
Admission: RE | Admit: 2011-09-28 | Discharge: 2011-09-28 | Disposition: A | Discharge status: Home / Self Care | Payer: Self-pay | Source: Ambulatory Visit | Attending: Obstetrics and Gynecology | Admitting: Obstetrics and Gynecology

## 2011-09-28 DIAGNOSIS — R921 Mammographic calcification found on diagnostic imaging of breast: Secondary | ICD-10-CM

## 2011-10-20 ENCOUNTER — Ambulatory Visit: Payer: PRIVATE HEALTH INSURANCE | Admitting: Internal Medicine

## 2011-10-29 ENCOUNTER — Emergency Department (HOSPITAL_COMMUNITY): Payer: PRIVATE HEALTH INSURANCE

## 2011-10-29 ENCOUNTER — Encounter (HOSPITAL_COMMUNITY): Payer: Self-pay | Admitting: *Deleted

## 2011-10-29 ENCOUNTER — Emergency Department (HOSPITAL_COMMUNITY)
Admission: EM | Admit: 2011-10-29 | Discharge: 2011-10-29 | Disposition: A | Discharge status: Home / Self Care | Payer: PRIVATE HEALTH INSURANCE | Attending: Emergency Medicine | Admitting: Emergency Medicine

## 2011-10-29 DIAGNOSIS — Z96659 Presence of unspecified artificial knee joint: Secondary | ICD-10-CM | POA: Insufficient documentation

## 2011-10-29 DIAGNOSIS — X500XXA Overexertion from strenuous movement or load, initial encounter: Secondary | ICD-10-CM | POA: Insufficient documentation

## 2011-10-29 DIAGNOSIS — Y9229 Other specified public building as the place of occurrence of the external cause: Secondary | ICD-10-CM | POA: Insufficient documentation

## 2011-10-29 DIAGNOSIS — S93609A Unspecified sprain of unspecified foot, initial encounter: Secondary | ICD-10-CM | POA: Insufficient documentation

## 2011-10-29 DIAGNOSIS — M25579 Pain in unspecified ankle and joints of unspecified foot: Secondary | ICD-10-CM | POA: Insufficient documentation

## 2011-10-29 DIAGNOSIS — R0609 Other forms of dyspnea: Secondary | ICD-10-CM | POA: Insufficient documentation

## 2011-10-29 DIAGNOSIS — R0989 Other specified symptoms and signs involving the circulatory and respiratory systems: Secondary | ICD-10-CM | POA: Insufficient documentation

## 2011-10-29 NOTE — ED Notes (Signed)
Ortho tech called to apply ankle splint

## 2011-10-29 NOTE — ED Notes (Signed)
Pt stepped wrong and fell today, rt ankle pain and knee pain

## 2011-10-29 NOTE — Progress Notes (Signed)
Orthopedic Tech Progress Note Patient Details:  Debra Woodward [birth year] [id number]             Type of Splint: Post (short) Splint Location: prefab fiberglass posterior splint applied to lower right extremity.       Patient ID: Debra Woodward, female   DOB: Oct 01, 1951, 60 y.o.   MRN: [id number]   Debra Woodward 10/29/2011, 10:36 PM

## 2011-10-29 NOTE — ED Notes (Signed)
Pt reports she was vacuuming around the pulpit tonight and when she stepped backwards there was no landing. Pt reports twisting her right ankle inward and now has pain to top of right foot and lateral right ankle. Pt also reports "rug burn" to right knee. No deformity. Pt able to put weight on right ankle and knee. Pt reports history of right knee replacement.

## 2011-10-29 NOTE — ED Notes (Signed)
Respirations are not 97 but are 20.

## 2011-10-29 NOTE — ED Provider Notes (Signed)
History     CSN: [id number] Arrival date & time: 10/29/2011  6:43 PM   First MD Initiated Contact with Patient 10/29/11 2157      Chief Complaint  Patient presents with  . Ankle Pain    fall    (Consider location/radiation/quality/duration/timing/severity/associated sxs/prior treatment) HPI Comments: Stepped off Pulpit step turning her R foot now with pain to lateral R foot and diatal lateral ankle Also is concerned for R knee as she landed on the knee and has a small superficial abrasion to anterior patella  Hz of R knee replacement able to walk and bend knee without difficulity but having pain in foot   Patient is a 60 y.o. female presenting with ankle pain. The history is provided by the patient.  Ankle Pain  The incident occurred 12 to 24 hours ago. The injury mechanism was a fall. The pain is present in the left ankle and left knee. The quality of the pain is described as aching. The pain is at a severity of 3/10. The pain is mild. The pain has been constant since onset. Pertinent negatives include no numbness, no inability to bear weight, no loss of motion and no muscle weakness. The symptoms are aggravated by bearing weight. She has tried ice for the symptoms.    Past Medical History  Diagnosis Date  . ALLERGIC RHINITIS 09/14/2006    Past Surgical History  Procedure Date  . Abdominal hysterectomy   . Knee arthroscopy   . Breast surgery   . Carpal tunnel release   . Toe surgery     Family History  Problem Relation Age of Onset  . Stroke Mother   . Diabetes Father   . Hypertension Sister     History  Substance Use Topics  . Smoking status: Former Smoker    Quit date: 09/27/2010  . Smokeless tobacco: Not on file  . Alcohol Use: 0.6 oz/week    1 Glasses of wine per week    OB History    Grav Para Term Preterm Abortions TAB SAB Ect Mult Living                  Review of Systems  Constitutional: Negative.   HENT: Negative.   Eyes: Negative.     Respiratory: Negative.   Cardiovascular: Negative.   Gastrointestinal: Negative.   Genitourinary: Negative.   Musculoskeletal: Negative.   Skin: Negative.   Neurological: Negative.  Negative for numbness.  Hematological: Negative.   Psychiatric/Behavioral: Negative.     Allergies  Prednisone  Home Medications   Current Outpatient Rx  Name Route Sig Dispense Refill  . DOCUSATE SODIUM 100 MG PO CAPS Oral Take 100 mg by mouth 2 (two) times daily.      Marland Kitchen LORATADINE 10 MG PO TABS Oral Take 10 mg by mouth daily.      . MECLIZINE HCL 25 MG PO TABS  TAKE ONE TABLET BY MOUTH THREE TIMES DAILY AS NEEDED FOR  DIZZINESS  OR  NAUSEA 30 tablet 1  . MELOXICAM 15 MG PO TABS Oral Take 15 mg by mouth daily as needed. PAIN/INFLAMMATION    . ONE-DAILY MULTI VITAMINS PO TABS Oral Take 1 tablet by mouth daily.      Marland Kitchen ECHINACEA ACZ PO Oral Take 1 tablet by mouth daily.        BP 143/90  Pulse 83  Temp(Src) 98.3 F (36.8 C) (Oral)  Resp 18  SpO2 98%  Physical Exam  Constitutional: She is oriented to  person, place, and time. She appears well-developed and well-nourished.  HENT:  Head: Normocephalic.  Eyes: Pupils are equal, round, and reactive to light.  Neck: Normal range of motion.  Cardiovascular: Normal rate.   Pulmonary/Chest: She is in respiratory distress.  Musculoskeletal:       Slight errhythmia lateral proximal foot minimal swelling  full ROM ankle toes   Neurological: She is oriented to person, place, and time.  Skin: Skin is warm.  Psychiatric: She has a normal mood and affect.    ED Course  Procedures (including critical care time)  Labs Reviewed - No data to display Dg Knee 2 Views Right  10/29/2011  *RADIOLOGY REPORT*  Clinical Data: Fall  RIGHT KNEE - 1-2 VIEW  Comparison: 09/17/2010  Findings: Right knee replacement in satisfactory position and alignment.  Negative for fracture or loosening.  IMPRESSION: No acute abnormality.  Original Report Authenticated By: Camelia Phenes, M.D.   Dg Ankle Complete Right  10/29/2011  *RADIOLOGY REPORT*  Clinical Data: Fall.  RIGHT ANKLE - COMPLETE 3+ VIEW  Comparison: None.  Findings: Negative for fracture.  Normal alignment.  Mild spurring of the medial malleolus compatible with prior injury.  Surgical fixation screw in the distal fifth metatarsal.  Calcification in the Achilles tendon.  IMPRESSION: Negative for acute fracture.  Original Report Authenticated By: Camelia Phenes, M.D.     1. Foot sprain      Discuss x-ray results, indicating a fracture.  Patient states she is very relieved.  She has states she has meloxicam naproxen Motrin and Vicodin at home and would prefer to stick with nonsteroidals versus the Vicodin as she doesn't like the side effects.  Will place a splint and ASO for comfort.  Have patient followup with her primary care doctor MDM  Will xray to evaluate for fracture vs sprain         Arman Filter, NP 10/29/11 2215  Arman Filter, NP 10/29/11 4540  Arman Filter, NP 10/29/11 2220

## 2011-10-30 NOTE — ED Provider Notes (Signed)
Medical screening examination/treatment/procedure(s) were performed by non-physician practitioner and as supervising physician I was immediately available for consultation/collaboration.  Reinhard Schack, MD 10/30/11 0220 

## 2011-12-19 ENCOUNTER — Other Ambulatory Visit (INDEPENDENT_AMBULATORY_CARE_PROVIDER_SITE_OTHER): Payer: PRIVATE HEALTH INSURANCE

## 2011-12-19 DIAGNOSIS — Z0000 Encounter for general adult medical examination without abnormal findings: Secondary | ICD-10-CM

## 2011-12-19 LAB — POCT URINALYSIS DIPSTICK
Protein, UA: NEGATIVE
Spec Grav, UA: 1.02
Urobilinogen, UA: 0.2
pH, UA: 6

## 2011-12-19 LAB — LIPID PANEL
Cholesterol: 158 mg/dL (ref 0–200)
HDL: 85.7 mg/dL (ref >39.00)
LDL Cholesterol: 63 mg/dL (ref 0–99)
VLDL: 9.4 mg/dL (ref 0.0–40.0)

## 2011-12-19 LAB — CBC WITH DIFFERENTIAL/PLATELET
Basophils Absolute: 0 10*3/uL (ref 0.0–0.1)
Basophils Relative: 0.4 % (ref 0.0–3.0)
Eosinophils Relative: 3.7 % (ref 0.0–5.0)
HCT: 41 % (ref 36.0–46.0)
Hemoglobin: 13.7 g/dL (ref 12.0–15.0)
Lymphocytes Relative: 33.6 % (ref 12.0–46.0)
Monocytes Relative: 9.1 % (ref 3.0–12.0)
Neutro Abs: 3.3 10*3/uL (ref 1.4–7.7)
RBC: 4.36 Mil/uL (ref 3.87–5.11)
WBC: 6.1 10*3/uL (ref 4.5–10.5)

## 2011-12-19 LAB — BASIC METABOLIC PANEL
Calcium: 9.7 mg/dL (ref 8.4–10.5)
GFR: 96.81 mL/min (ref >60.00)
Potassium: 4 mEq/L (ref 3.5–5.1)
Sodium: 142 mEq/L (ref 135–145)

## 2011-12-19 LAB — HEPATIC FUNCTION PANEL
ALT: 18 U/L (ref 0–35)
AST: 21 U/L (ref 0–37)
Albumin: 4.2 g/dL (ref 3.5–5.2)
Alkaline Phosphatase: 88 U/L (ref 39–117)
Total Protein: 6.8 g/dL (ref 6.0–8.3)

## 2011-12-28 ENCOUNTER — Encounter: Payer: Self-pay | Admitting: Internal Medicine

## 2011-12-28 ENCOUNTER — Ambulatory Visit (INDEPENDENT_AMBULATORY_CARE_PROVIDER_SITE_OTHER): Payer: PRIVATE HEALTH INSURANCE | Admitting: Internal Medicine

## 2011-12-28 VITALS — BP 130/88 | HR 92 | Temp 98.7°F | Ht 65.0 in | Wt 186.0 lb

## 2011-12-28 DIAGNOSIS — Z0000 Encounter for general adult medical examination without abnormal findings: Secondary | ICD-10-CM

## 2011-12-28 NOTE — Progress Notes (Signed)
Patient ID: Debra Woodward, female   DOB: 1951-07-09, 61 y.o.   MRN: [id number]  cpx  Past Medical History  Diagnosis Date  . ALLERGIC RHINITIS 09/14/2006    History   Social History  . Marital Status: Single    Spouse Name: N/A    Number of Children: N/A  . Years of Education: N/A   Occupational History  . Not on file.   Social History Main Topics  . Smoking status: Former Smoker    Quit date: 09/27/2010  . Smokeless tobacco: Not on file  . Alcohol Use: 0.6 oz/week    1 Glasses of wine per week  . Drug Use: No  . Sexually Active: Not on file   Other Topics Concern  . Not on file   Social History Narrative  . No narrative on file    Past Surgical History  Procedure Date  . Abdominal hysterectomy   . Knee arthroscopy   . Breast surgery   . Carpal tunnel release   . Toe surgery     Family History  Problem Relation Age of Onset  . Stroke Mother   . Diabetes Father   . Hypertension Sister     Allergies  Allergen Reactions  . Prednisone Other (See Comments)    hallucinations    Current Outpatient Prescriptions on File Prior to Visit  Medication Sig Dispense Refill  . docusate sodium (COLACE) 100 MG capsule Take 100 mg by mouth 3 (three) times a week.       . loratadine (CLARITIN) 10 MG tablet Take 10 mg by mouth daily.        . Multiple Vitamin (MULTIVITAMIN) tablet Take 1 tablet by mouth daily.           patient denies chest pain, shortness of breath, orthopnea. Denies lower extremity edema, abdominal pain, change in appetite, change in bowel movements. Patient denies rashes, musculoskeletal complaints. No other specific complaints in a complete review of systems.   BP 146/88  Pulse 92  Temp(Src) 98.7 F (37.1 C) (Oral)  Ht 5\' 5"  (1.651 m)  Wt 186 lb (84.369 kg)  BMI 30.95 kg/m2  Well-developed well-nourished female in no acute distress. HEENT exam atraumatic, normocephalic, extraocular muscles are intact. Neck is supple. No jugular venous distention  no thyromegaly. Chest clear to auscultation without increased work of breathing. Cardiac exam S1 and S2 are regular. Abdominal exam active bowel sounds, soft, nontender. Extremities no edema. Neurologic exam she is alert without any motor sensory deficits. Gait is normal.   Well Visit : health maint utd

## 2012-01-05 ENCOUNTER — Telehealth: Payer: Self-pay | Admitting: *Deleted

## 2012-01-05 NOTE — Telephone Encounter (Signed)
Pt needs pres for shingles faxed to [facility] at [phone]

## 2012-01-06 MED ORDER — ZOSTER VACCINE LIVE 19400 UNT/0.65ML ~~LOC~~ SOLR: 0.6500 mL | Freq: Once | SUBCUTANEOUS | Status: AC

## 2012-01-06 NOTE — Telephone Encounter (Signed)
Ok per Dr. Cato Mulligan to send rx to [facility].

## 2012-03-19 ENCOUNTER — Other Ambulatory Visit: Payer: Self-pay | Admitting: Obstetrics and Gynecology

## 2012-03-19 DIAGNOSIS — R921 Mammographic calcification found on diagnostic imaging of breast: Secondary | ICD-10-CM

## 2012-03-21 ENCOUNTER — Other Ambulatory Visit: Payer: Self-pay | Admitting: *Deleted

## 2012-03-21 DIAGNOSIS — R921 Mammographic calcification found on diagnostic imaging of breast: Secondary | ICD-10-CM

## 2012-03-30 ENCOUNTER — Ambulatory Visit
Admission: RE | Admit: 2012-03-30 | Discharge: 2012-03-30 | Disposition: A | Discharge status: Home / Self Care | Payer: PRIVATE HEALTH INSURANCE | Source: Ambulatory Visit | Attending: Obstetrics and Gynecology | Admitting: Obstetrics and Gynecology

## 2012-03-30 DIAGNOSIS — R921 Mammographic calcification found on diagnostic imaging of breast: Secondary | ICD-10-CM

## 2012-08-07 ENCOUNTER — Ambulatory Visit (INDEPENDENT_AMBULATORY_CARE_PROVIDER_SITE_OTHER): Payer: PRIVATE HEALTH INSURANCE

## 2012-08-07 DIAGNOSIS — Z23 Encounter for immunization: Secondary | ICD-10-CM

## 2012-08-21 ENCOUNTER — Encounter: Payer: Self-pay | Admitting: Gynecology

## 2012-08-21 DIAGNOSIS — D219 Benign neoplasm of connective and other soft tissue, unspecified: Secondary | ICD-10-CM | POA: Insufficient documentation

## 2012-08-22 ENCOUNTER — Ambulatory Visit (INDEPENDENT_AMBULATORY_CARE_PROVIDER_SITE_OTHER): Payer: PRIVATE HEALTH INSURANCE | Admitting: Obstetrics and Gynecology

## 2012-08-22 DIAGNOSIS — N6009 Solitary cyst of unspecified breast: Secondary | ICD-10-CM

## 2012-08-22 DIAGNOSIS — N898 Other specified noninflammatory disorders of vagina: Secondary | ICD-10-CM

## 2012-08-22 LAB — WET PREP FOR TRICH, YEAST, CLUE: Trich, Wet Prep: NONE SEEN

## 2012-08-22 MED ORDER — TERCONAZOLE 0.8 % VA CREA: 1.0000 | TOPICAL_CREAM | Freq: Every day | VAGINAL | Status: DC

## 2012-08-22 MED ORDER — SULFAMETHOXAZOLE-TRIMETHOPRIM 800-160 MG PO TABS: 1.0000 | ORAL_TABLET | Freq: Two times a day (BID) | ORAL | Status: DC

## 2012-08-22 NOTE — Progress Notes (Signed)
Patient came in today because she had noticed a small growth on the outside of her right nipple. I think she picked at it and it bled. She had her last mammogram in May, 2013 and was normal except for a sclerosing papilloma of left breast in addition she's had a vaginal discharge. She was on antibiotics. She took Diflucan without results. She is also having itching vaginally.  Exam: Kennon Portela present. Right breast: There is a small lesion on top of the nipple but it looks like it could be just a sebaceous cyst and that is bruised and is still oozing a little blood. There are no masses in the right breast. There are no skin changes. Left breast: No skin changes or masses.  External genitalia: Within normal limits. BUS: Within normal limits. Vaginal exam: Atrophic changes with yeast like discharge. Wet prep negative except for clue cells.  Assessment: #1. Growth of right nipple-probably benign #2. Yeast vaginitis  Plan: Warm soaks to nipple. Septra DS twice a day for 7 days. Call me in a week. If still present we'll referred to a surgeon. Terconazole 3 cream.

## 2012-08-22 NOTE — Patient Instructions (Addendum)
Call me in a week and report on breast lesion.

## 2012-08-24 ENCOUNTER — Telehealth: Payer: Self-pay | Admitting: Internal Medicine

## 2012-08-24 NOTE — Telephone Encounter (Signed)
Caller: [name]/Patient; Patient Name: Debra Woodward; PCP: ; Janith Lima Phone Number: [phone]; Reason for call: Other Patient is calling concerning her the inside of her  right ankle. Onset 08/10/12.  She denies any injury. Right ankle swollen worse on inside of the ankle.+ pain and discomfort along with   Numbness in the heel area and tingling of the foot. Emergent s/sx ruled out per Numbness and Tingling protocol  with the exception to Gradual Onset of numbness or tingling of an extremity". See provider in 72 hours. Appointment scheduled 08/25/12 at 09:45 with Dr. Thornell Mule MD at [facility]. Patient expressed undestanding. Home care advice reviewed. She will call back for questions, changes or concerns.

## 2012-08-25 ENCOUNTER — Other Ambulatory Visit: Payer: Self-pay | Admitting: Family Medicine

## 2012-08-25 ENCOUNTER — Ambulatory Visit (HOSPITAL_COMMUNITY)
Admission: RE | Admit: 2012-08-25 | Discharge: 2012-08-25 | Disposition: A | Discharge status: Home / Self Care | Payer: PRIVATE HEALTH INSURANCE | Source: Ambulatory Visit | Attending: Family Medicine | Admitting: Family Medicine

## 2012-08-25 ENCOUNTER — Encounter: Payer: Self-pay | Admitting: Family Medicine

## 2012-08-25 ENCOUNTER — Ambulatory Visit (HOSPITAL_COMMUNITY): Admission: RE | Admit: 2012-08-25 | Payer: PRIVATE HEALTH INSURANCE | Source: Ambulatory Visit

## 2012-08-25 ENCOUNTER — Ambulatory Visit (INDEPENDENT_AMBULATORY_CARE_PROVIDER_SITE_OTHER): Payer: PRIVATE HEALTH INSURANCE | Admitting: Family Medicine

## 2012-08-25 VITALS — BP 130/88 | Temp 98.3°F | Wt 177.0 lb

## 2012-08-25 DIAGNOSIS — M25579 Pain in unspecified ankle and joints of unspecified foot: Secondary | ICD-10-CM

## 2012-08-25 DIAGNOSIS — M25571 Pain in right ankle and joints of right foot: Secondary | ICD-10-CM | POA: Insufficient documentation

## 2012-08-25 DIAGNOSIS — M25473 Effusion, unspecified ankle: Secondary | ICD-10-CM | POA: Insufficient documentation

## 2012-08-25 DIAGNOSIS — M25476 Effusion, unspecified foot: Secondary | ICD-10-CM | POA: Insufficient documentation

## 2012-08-25 MED ORDER — TRAMADOL HCL 50 MG PO TABS: 50.0000 mg | ORAL_TABLET | Freq: Two times a day (BID) | ORAL | Status: DC | PRN

## 2012-08-25 NOTE — Patient Instructions (Signed)
I'd like to get xrays today of right foot.  They will tell you up front how to do this. I wonder if this is a tendonitis of the lateral foot - Treat this with naproxen scheduled twice daily with food for 3-5 days, and use tramadol for breaktrhough pain. Rest and elevate foot when you get home. Use orthopedic boot you have at home. Ice/heat to foot as well. Keep scheduled appointment with podiatry next week.

## 2012-08-25 NOTE — Assessment & Plan Note (Signed)
Marked swelling medial>lateral ankle on right.  No known trauma/injury. I would like to obtain xrays today to eval posterior foot anatomy (talus and calcaneus). Treat with ice/heat, naprosyn scheduled for 5 days while sees podiatrist, and tramadol for breakthrough pain. Keep appt with podiatry. Update [place] if worsening. Recommended go home and wear ortho boot - has at home. Rest foot.

## 2012-08-25 NOTE — Progress Notes (Signed)
  Subjective:    Patient ID: Debra Woodward, female    DOB: 19-Jun-1951, 61 y.o.   MRN: [id number]  HPI CC: right ankle pain/swelling  2-3wk h/o ankle pain that started medially, now some pain laterally as well.  Using heating pad.    No fevers/chills, rashes.  Never had anything like this in past.  H/o osteophyte surgery 1981 at same area.  Able to stretch foot, but dorsiflexion causes pain.  Only possible injury is when charlie horse woke her up from sleep and she ran to kitchen to get vinegar, but no specific trauma/injury.  Tried naprosyn x1 but didn't help.  Also tried advils.  Has appt with podiatry this coming week.  In last few days started on bactrim for nipple discharge per GYN Dianne Dun).  Past Medical History  Diagnosis Date  . ALLERGIC RHINITIS 09/14/2006  . Fibroid     Review of Systems Per HPI    Objective:   Physical Exam  Vitals reviewed. Constitutional: She appears well-developed and well-nourished. No distress.  Musculoskeletal:       L ankle FROM R ankle - limited ROM 2/2 pain.  + evident swelling medial > lateral ankles posterior to malleoli, and tenderness to palpation with point tenderness at lateral posterior talus  Pain with strength testing of dorsiflexion and inversion/eversion.  No ligament laxity appreciated. No pain with squeeze test  Skin: Skin is warm. No rash noted. No erythema.       Assessment & Plan:

## 2012-08-25 NOTE — Addendum Note (Signed)
Addended by: Eustaquio Boyden on: 08/25/2012 10:27 AM   Modules accepted: Orders

## 2012-09-04 ENCOUNTER — Telehealth: Payer: Self-pay | Admitting: *Deleted

## 2012-09-04 NOTE — Telephone Encounter (Signed)
Pt called to follow up OV 08/22/12 pt given Septra DS twice a day for 7 days, took medication for breast leaking and it has now cleared up. Pt is not having any problems with breast at this time

## 2012-10-24 ENCOUNTER — Encounter: Payer: Self-pay | Admitting: Family

## 2012-10-24 ENCOUNTER — Ambulatory Visit (INDEPENDENT_AMBULATORY_CARE_PROVIDER_SITE_OTHER): Payer: PRIVATE HEALTH INSURANCE | Admitting: Family

## 2012-10-24 ENCOUNTER — Ambulatory Visit (INDEPENDENT_AMBULATORY_CARE_PROVIDER_SITE_OTHER)
Admission: RE | Admit: 2012-10-24 | Discharge: 2012-10-24 | Disposition: A | Discharge status: Home / Self Care | Payer: PRIVATE HEALTH INSURANCE | Source: Ambulatory Visit | Attending: Family | Admitting: Family

## 2012-10-24 VITALS — BP 162/90 | HR 100 | Temp 98.3°F | Wt 181.0 lb

## 2012-10-24 DIAGNOSIS — M545 Low back pain, unspecified: Secondary | ICD-10-CM

## 2012-10-24 DIAGNOSIS — IMO0002 Reserved for concepts with insufficient information to code with codable children: Secondary | ICD-10-CM

## 2012-10-24 DIAGNOSIS — M5416 Radiculopathy, lumbar region: Secondary | ICD-10-CM

## 2012-10-24 MED ORDER — PREDNISONE 20 MG PO TABS: ORAL_TABLET | ORAL | Status: AC

## 2012-10-24 MED ORDER — METHYLPREDNISOLONE ACETATE 40 MG/ML IJ SUSP
80.0000 mg | Freq: Once | INTRAMUSCULAR | Status: AC
  Administered 2012-10-24: 80 mg via INTRAMUSCULAR

## 2012-10-24 NOTE — Progress Notes (Signed)
Subjective:    Patient ID: Debra Woodward, female    DOB: 10/17/1951, 61 y.o.   MRN: [id number]  HPI 61 year old African American female, nonsmoker, patient of Dr. Cato Mulligan is in today with complaints of low back pain and pain radiating down her right leg x2 months, that waxes and wanes. At its worse, to 10 minutes and, described as a dull, throbbing pain. She's taken Advil, naproxen, and Mobic in the past. The combination of Mobic and naproxen seemed to help temporarily. She has a history of a ruptured disc in 1994. In 2 years ago she had a fall reinjuring her lower back. Has not had any images of her back recently.   Review of Systems  HENT: Negative.   Eyes: Negative.   Respiratory: Negative.   Cardiovascular: Negative.   Gastrointestinal: Negative.   Musculoskeletal: Positive for back pain.  Skin: Negative.   Neurological: Negative.   Hematological: Negative.   Psychiatric/Behavioral: Negative.    Past Medical History  Diagnosis Date  . ALLERGIC RHINITIS 09/14/2006  . Fibroid     History   Social History  . Marital Status: Single    Spouse Name: N/A    Number of Children: N/A  . Years of Education: N/A   Occupational History  . Not on file.   Social History Main Topics  . Smoking status: Current Every Day Smoker -- 0.5 packs/day    Types: Cigarettes    Last Attempt to Quit: 09/27/2010  . Smokeless tobacco: Not on file  . Alcohol Use: 0.6 oz/week    1 Glasses of wine per week  . Drug Use: No  . Sexually Active: Not on file   Other Topics Concern  . Not on file   Social History Narrative  . No narrative on file    Past Surgical History  Procedure Date  . Abdominal hysterectomy   . Knee arthroscopy   . Carpal tunnel release   . Toe surgery   . Breast surgery     Biopsy    Family History  Problem Relation Age of Onset  . Stroke Mother   . Diabetes Mother   . Diabetes Father   . Hypertension Sister     Allergies  Allergen Reactions  . Prednisone  Other (See Comments)    hallucinations    Current Outpatient Prescriptions on File Prior to Visit  Medication Sig Dispense Refill  . docusate sodium (COLACE) 100 MG capsule Take 100 mg by mouth 3 (three) times a week.       . loratadine (CLARITIN) 10 MG tablet Take 10 mg by mouth daily.        . Multiple Vitamin (MULTIVITAMIN) tablet Take 1 tablet by mouth daily.        . naproxen (NAPROSYN) 500 MG tablet Take 500 mg by mouth 2 (two) times daily with a meal.      . NONFORMULARY OR COMPOUNDED ITEM Boric acid      . terconazole (TERAZOL 3) 0.8 % vaginal cream Place 1 applicator vaginally at bedtime.  20 g  2  . traMADol (ULTRAM) 50 MG tablet Take 1 tablet (50 mg total) by mouth 2 (two) times daily as needed for pain.  30 tablet  0  . sulfamethoxazole-trimethoprim (BACTRIM DS,SEPTRA DS) 800-160 MG per tablet Take 1 tablet by mouth 2 (two) times daily.  14 tablet  0   No current facility-administered medications on file prior to visit.    BP 162/90  Pulse 100  Temp 98.3 F (  36.8 C) (Oral)  Wt 181 lb (82.101 kg)  SpO2 96%chart    Objective:   Physical Exam  Constitutional: She appears well-developed and well-nourished.  Neck: Normal range of motion.  Cardiovascular: Normal rate, regular rhythm and normal heart sounds.   Pulmonary/Chest: Effort normal and breath sounds normal.  Abdominal: Bowel sounds are normal.  Musculoskeletal: Normal range of motion. She exhibits no edema and no tenderness.  Neurological: She is alert. She has normal reflexes. She displays normal reflexes. No cranial nerve deficit. She exhibits normal muscle tone. Coordination normal.  Skin: Skin is dry.  Psychiatric: She has a normal mood and affect.          Assessment & Plan:  Assessment: Low back pain, lumbar radiculopathy  Plan: Depo-Medrol 80 mg IM x1 given in the office today. Prednisone 60x3, 40x3, 20x3 given. X-ray of the L-spine. Patient call the office if symptoms worsen or persist. Recheck a  schedule, and when necessary.

## 2012-10-24 NOTE — Patient Instructions (Addendum)
Sciatica Sciatica is pain, weakness, numbness, or tingling along the path of the sciatic nerve. The nerve starts in the lower back and runs down the back of each leg. The nerve controls the muscles in the lower leg and in the back of the knee, while also providing sensation to the back of the thigh, lower leg, and the sole of your foot. Sciatica is a symptom of another medical condition. For instance, nerve damage or certain conditions, such as a herniated disk or bone spur on the spine, pinch or put pressure on the sciatic nerve. This causes the pain, weakness, or other sensations normally associated with sciatica. Generally, sciatica only affects one side of the body. CAUSES   Herniated or slipped disc.  Degenerative disk disease.  A pain disorder involving the narrow muscle in the buttocks (piriformis syndrome).  Pelvic injury or fracture.  Pregnancy.  Tumor (rare). SYMPTOMS  Symptoms can vary from mild to very severe. The symptoms usually travel from the low back to the buttocks and down the back of the leg. Symptoms can include:  Mild tingling or dull aches in the lower back, leg, or hip.  Numbness in the back of the calf or sole of the foot.  Burning sensations in the lower back, leg, or hip.  Sharp pains in the lower back, leg, or hip.  Leg weakness.  Severe back pain inhibiting movement. These symptoms may get worse with coughing, sneezing, laughing, or prolonged sitting or standing. Also, being overweight may worsen symptoms. DIAGNOSIS  Your caregiver will perform a physical exam to look for common symptoms of sciatica. He or she may ask you to do certain movements or activities that would trigger sciatic nerve pain. Other tests may be performed to find the cause of the sciatica. These may include:  Blood tests.  X-rays.  Imaging tests, such as an MRI or CT scan. TREATMENT  Treatment is directed at the cause of the sciatic pain. Sometimes, treatment is not necessary  and the pain and discomfort goes away on its own. If treatment is needed, your caregiver may suggest:  Over-the-counter medicines to relieve pain.  Prescription medicines, such as anti-inflammatory medicine, muscle relaxants, or narcotics.  Applying heat or ice to the painful area.  Steroid injections to lessen pain, irritation, and inflammation around the nerve.  Reducing activity during periods of pain.  Exercising and stretching to strengthen your abdomen and improve flexibility of your spine. Your caregiver may suggest losing weight if the extra weight makes the back pain worse.  Physical therapy.  Surgery to eliminate what is pressing or pinching the nerve, such as a bone spur or part of a herniated disk. HOME CARE INSTRUCTIONS   Only take over-the-counter or prescription medicines for pain or discomfort as directed by your caregiver.  Apply ice to the affected area for 20 minutes, 3 4 times a day for the first 48 72 hours. Then try heat in the same way.  Exercise, stretch, or perform your usual activities if these do not aggravate your pain.  Attend physical therapy sessions as directed by your caregiver.  Keep all follow-up appointments as directed by your caregiver.  Do not wear high heels or shoes that do not provide proper support.  Check your mattress to see if it is too soft. A firm mattress may lessen your pain and discomfort. SEEK IMMEDIATE MEDICAL CARE IF:   You lose control of your bowel or bladder (incontinence).  You have increasing weakness in the lower back,   pelvis, buttocks, or legs.  You have redness or swelling of your back.  You have a burning sensation when you urinate.  You have pain that gets worse when you lie down or awakens you at night.  Your pain is worse than you have experienced in the past.  Your pain is lasting longer than 4 weeks.  You are suddenly losing weight without reason. MAKE SURE YOU:  Understand these  instructions.  Will watch your condition.  Will get help right away if you are not doing well or get worse. Document Released: 10/25/2001 Document Revised: 05/01/2012 Document Reviewed: 03/11/2012 Exit[employer] Patient Information 2013 [employer].  

## 2012-11-13 ENCOUNTER — Encounter: Payer: Self-pay | Admitting: Internal Medicine

## 2012-11-13 ENCOUNTER — Ambulatory Visit (INDEPENDENT_AMBULATORY_CARE_PROVIDER_SITE_OTHER): Payer: PRIVATE HEALTH INSURANCE | Admitting: Internal Medicine

## 2012-11-13 ENCOUNTER — Telehealth: Payer: Self-pay | Admitting: Internal Medicine

## 2012-11-13 VITALS — BP 172/90 | HR 60 | Temp 98.9°F | Wt 180.0 lb

## 2012-11-13 DIAGNOSIS — M5136 Other intervertebral disc degeneration, lumbar region: Secondary | ICD-10-CM

## 2012-11-13 DIAGNOSIS — M51369 Other intervertebral disc degeneration, lumbar region without mention of lumbar back pain or lower extremity pain: Secondary | ICD-10-CM | POA: Insufficient documentation

## 2012-11-13 DIAGNOSIS — R030 Elevated blood-pressure reading, without diagnosis of hypertension: Secondary | ICD-10-CM

## 2012-11-13 DIAGNOSIS — M5137 Other intervertebral disc degeneration, lumbosacral region: Secondary | ICD-10-CM

## 2012-11-13 DIAGNOSIS — R609 Edema, unspecified: Secondary | ICD-10-CM

## 2012-11-13 DIAGNOSIS — R220 Localized swelling, mass and lump, head: Secondary | ICD-10-CM

## 2012-11-13 DIAGNOSIS — IMO0001 Reserved for inherently not codable concepts without codable children: Secondary | ICD-10-CM

## 2012-11-13 DIAGNOSIS — M7918 Myalgia, other site: Secondary | ICD-10-CM

## 2012-11-13 MED ORDER — CELECOXIB 200 MG PO CAPS: 200.0000 mg | ORAL_CAPSULE | Freq: Every day | ORAL | Status: DC

## 2012-11-13 NOTE — Progress Notes (Deleted)
  Subjective:    Patient ID: Debra Woodward, female    DOB: 07-26-1951, 61 y.o.   MRN: [id number]  HPI    Review of Systems     Objective:   Physical Exam        Assessment & Plan:

## 2012-11-13 NOTE — Patient Instructions (Signed)
Take the antibiotic as  Directed  .  Per Dr Ezzard Standing  I am uncertain why you have the bad buttock pain.      Advise referral   To orhto for back spine evaluation and you may need MRI.   Can try celebrex in the meantime one a day as an antiinflammatory .

## 2012-11-13 NOTE — Telephone Encounter (Signed)
Patient Information:  Caller Name: [name]  Phone: [phone]  Patient: Debra Woodward, Debra Woodward  Gender: Female  DOB: 02/10/1951  Age: 61 Years  PCP: Birdie Sons (Adults only)  Office Follow Up:  Does the office need to follow up with this patient?: No  Instructions For The Office: N/A   Symptoms  Reason For Call & Symptoms: L side of face and ear swollen, ear pain, still having pain in buttocks after taking prednisone, was dx with arthritis recently, afebrile, taking fluids, no diff breathing,  Reviewed Health History In EMR: Yes  Reviewed Medications In EMR: Yes  Reviewed Allergies In EMR: Yes  Reviewed Surgeries / Procedures: Yes  Date of Onset of Symptoms: 11/12/2012  Treatments Tried: heating pad  Treatments Tried Worked: No  Guideline(s) Used:  Face Swelling  Disposition Per Guideline:   See Today in Office  Reason For Disposition Reached:   Patient wants to be seen  Advice Given:  N/A  Appointment Scheduled:  11/13/2012 15:45:00 Appointment Scheduled Provider:  Berniece Andreas (Family Practice)

## 2012-11-13 NOTE — Progress Notes (Signed)
Chief Complaint  Patient presents with  . Facial Swelling    Started this morning.  Very painful to touch.    HPI: Patient comes in today for SDA for   problems evaluation. Patient has one day of the large swelling of her left face parotid gland she actually saw Dr. Ezzard Standing ear nose and throat today and was felt to have an obstructed parotid gland and given not only Keflex but Augmentin to begin along with salivary flow methods. She has no fever but it is painful. She may have had something like this long time ago. She is to followup after the holidays it is not getting better.  She also states that she is still having severe problems with buttocks upper thigh pain. This is been going on for about 2-3 months. She was seen by a Adline Mango a few weeks ago and was felt to have degenerative disease in her back causing this pain. She was given its high dose steroid injection and oral prednisone and she states it hasn't touched her pain. She continues to have difficulty sitting she bends over when she walks but no leg weakness bowel or bladder changes. She's tried every medicine she can think of that she has been nothing seems to work she is going back to school at [employer] when this semester starts and loss-of-resistance. Needs help or suggestion. Asks about ibuprofen. She denies any specific trauma and no history of the same. ROS: See pertinent positives and negatives per HPI.  Past Medical History  Diagnosis Date  . ALLERGIC RHINITIS 09/14/2006  . Fibroid     Family History  Problem Relation Age of Onset  . Stroke Mother   . Diabetes Mother   . Diabetes Father   . Hypertension Sister     History   Social History  . Marital Status: Single    Spouse Name: N/A    Number of Children: N/A  . Years of Education: N/A   Social History Main Topics  . Smoking status: Current Every Day Smoker -- 0.5 packs/day    Types: Cigarettes    Last Attempt to Quit: 09/27/2010  . Smokeless tobacco:  None  . Alcohol Use: 0.6 oz/week    1 Glasses of wine per week  . Drug Use: No  . Sexually Active: None   Other Topics Concern  . None   Social History Narrative  . None    Outpatient Encounter Prescriptions as of 11/13/2012  Medication Sig Dispense Refill  . docusate sodium (COLACE) 100 MG capsule Take 100 mg by mouth 3 (three) times a week.       . loratadine (CLARITIN) 10 MG tablet Take 10 mg by mouth daily.        . Multiple Vitamin (MULTIVITAMIN) tablet Take 1 tablet by mouth daily.        . traMADol (ULTRAM) 50 MG tablet Take 1 tablet (50 mg total) by mouth 2 (two) times daily as needed for pain.  30 tablet  0  . naproxen (NAPROSYN) 500 MG tablet Take 500 mg by mouth 2 (two) times daily with a meal.      . NONFORMULARY OR COMPOUNDED ITEM Boric acid      . [DISCONTINUED] sulfamethoxazole-trimethoprim (BACTRIM DS,SEPTRA DS) 800-160 MG per tablet Take 1 tablet by mouth 2 (two) times daily.  14 tablet  0  . [DISCONTINUED] terconazole (TERAZOL 3) 0.8 % vaginal cream Place 1 applicator vaginally at bedtime.  20 g  2  EXAM:  BP 172/90  Pulse 60  Temp 98.9 F (37.2 C) (Oral)  Wt 180 lb (81.647 kg)  There is no height on file to calculate BMI.  GENERAL: vitals reviewed and listed above, alert, oriented, appears well hydrated  with obvious large swelling on her left face parotid urea  HEENT: atraumatic, conjunctiva  clear, no obvious abnormalities on inspection of external nose and ears OP : no lesion edema or exudate  tenderness but no bogginess or redness to the left parotid swelling. No significant adenopathy.  NECK: no obvious masses on inspection palpation other than above CV: HRRR, no clubbing cyanosis or  MS: moves all extremities without noticeable focal  Abnormality  she's gets up bent over and walks somewhat that way localizes the pain to the buttocks area bilaterally without midline tenderness. No rashes noted no focal atrophy or weakness  PSYCH: Distress but  cooperative, no obvious depression or anxiety  ASSESSMENT AND PLAN:  Discussed the following assessment and plan:  1. Parotid swelling     felt to be  infection and obstuction per ent.   2. Buttock pain  Ambulatory referral to Orthopedic Surgery  3. Degenerative disc disease, lumbar  Ambulatory referral to Orthopedic Surgery   I agree with adding her Augmentin and follow up with ear nose and throat Dr. Her swelling is impressive. Regard to her buttocks pain is a bit atypical for lumbar disease but I suspect does is low sacral with radiation. She has no bowel or bladder dysfunction by history. Samples given of Celebrex one a day #9 given she can try this for pain in the meantime is an anti-inflammatory. We'll put in a referral. Hopefully this can be evaluated before she goes back to school. Would rather have the specialist order an MRI as appropriate because of this atypical pain that is quite severe  -Patient advised to return or notify health care team  immediately if symptoms worsen or persist or new concerns arise.  Patient Instructions  Take the antibiotic as  Directed  .  Per Dr Ezzard Standing  I am uncertain why you have the bad buttock pain.      Advise referral   To orhto for back spine evaluation and you may need MRI.   Can try celebrex in the meantime one a day as an antiinflammatory .    Neta Mends. Eufelia Veno M.D.

## 2012-11-16 ENCOUNTER — Telehealth: Payer: Self-pay | Admitting: Internal Medicine

## 2012-11-16 NOTE — Telephone Encounter (Signed)
Did you call the pt?

## 2012-11-16 NOTE — Telephone Encounter (Signed)
Caller: Debra Woodward/Patient; Phone: [phone]; Reason for Call: Patient returning a call from the office.  Unsure what the call was concerning.  Calling back to find out why she received call back.  Her Last office visit was 11/12/12 with Dr.  Fabian Sharp , diagnosed with Arthritis and was going to be given a referral to orthopedics  (1)  She states she was called by Tomasita Crumble today, given an appt 11/21/12 at 15:00 (2)  She states the Celebrex she was given by Dr. Fabian Sharp is worth  "A million bucks". It is working . (3)  She does not know what the office was calling her about today. Unable to locate note from the office. Unable to view referral tab

## 2012-11-16 NOTE — Telephone Encounter (Signed)
Seen Dr Fabian Sharp 11/13/12.  I did not call pt so forwarded note to [facility]

## 2012-11-17 NOTE — Progress Notes (Signed)
Have her come in for bp check with you

## 2012-11-19 NOTE — Telephone Encounter (Signed)
No, It was most likely Krystin from [facility].

## 2012-11-19 NOTE — Telephone Encounter (Signed)
Patient notified that we have not tried to contact her.  [facility] did make an appt with her.

## 2013-02-13 ENCOUNTER — Telehealth: Payer: Self-pay | Admitting: *Deleted

## 2013-02-13 MED ORDER — ESTRADIOL 10 MCG VA TABS: 10.0000 ug | ORAL_TABLET | VAGINAL | Status: DC

## 2013-02-13 NOTE — Telephone Encounter (Signed)
(  Dr. Reece Agar patient) pt will see you for annual on 03/06/13 she is requesting a Vagifem 25 mcg. I will place chart on your desk to review if needed.  Please advise

## 2013-02-13 NOTE — Telephone Encounter (Signed)
Has she used in the past and has just run out of Rx? If so you can prescribe Vagifem 10 mcg per vagina twice weekly.

## 2013-02-13 NOTE — Telephone Encounter (Signed)
Pt has used before, the Rx has expired because it was very old. rx will be sent, pt informed.

## 2013-02-20 ENCOUNTER — Other Ambulatory Visit: Payer: Self-pay | Admitting: *Deleted

## 2013-02-20 DIAGNOSIS — R921 Mammographic calcification found on diagnostic imaging of breast: Secondary | ICD-10-CM

## 2013-03-06 ENCOUNTER — Ambulatory Visit (INDEPENDENT_AMBULATORY_CARE_PROVIDER_SITE_OTHER): Payer: BC Managed Care – PPO | Admitting: Women's Health

## 2013-03-06 ENCOUNTER — Encounter: Payer: Self-pay | Admitting: Women's Health

## 2013-03-06 VITALS — BP 130/82 | Ht 65.0 in | Wt 170.0 lb

## 2013-03-06 DIAGNOSIS — D219 Benign neoplasm of connective and other soft tissue, unspecified: Secondary | ICD-10-CM

## 2013-03-06 DIAGNOSIS — Z113 Encounter for screening for infections with a predominantly sexual mode of transmission: Secondary | ICD-10-CM

## 2013-03-06 DIAGNOSIS — Z01419 Encounter for gynecological examination (general) (routine) without abnormal findings: Secondary | ICD-10-CM

## 2013-03-06 DIAGNOSIS — L293 Anogenital pruritus, unspecified: Secondary | ICD-10-CM

## 2013-03-06 DIAGNOSIS — D259 Leiomyoma of uterus, unspecified: Secondary | ICD-10-CM

## 2013-03-06 DIAGNOSIS — N898 Other specified noninflammatory disorders of vagina: Secondary | ICD-10-CM

## 2013-03-06 LAB — WET PREP FOR TRICH, YEAST, CLUE
Clue Cells Wet Prep HPF POC: NONE SEEN
Trich, Wet Prep: NONE SEEN
Yeast Wet Prep HPF POC: NONE SEEN

## 2013-03-06 MED ORDER — FLUCONAZOLE 150 MG PO TABS: 150.0000 mg | ORAL_TABLET | Freq: Once | ORAL | Status: DC

## 2013-03-06 NOTE — Progress Notes (Signed)
Debra Woodward [birth year] [id number]    History:    The patient presents for annual exam.  Postmenopausal on no HRT/TAH/fibroids-1996. Had quit smoking in 2011 and is now smoking 3-5 cigarettes daily. Negative colonoscopy 2006. Has fibrocystic breast, mammogram with ultrasound is scheduled for 04/01/2013. Has had both Zostavac and Pneumovax vaccines at primary care. Normal Pap history. New partner. Normal DEXA 08/2009, T score bilateral hip average -0.2.   Past medical history, past surgical history, family history and social history were all reviewed and documented in the EPIC chart. 2 grown children doing well. 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 Numerous family members with diabetes and hypertension.   ROS:  A  ROS was performed and pertinent positives and negatives are included in the history.  Exam:  Filed Vitals:   03/06/13 1446  BP: 130/82    General appearance:  Normal Head/Neck:  Normal, without cervical or supraclavicular adenopathy. Thyroid:  Symmetrical, normal in size, without palpable masses or nodularity. Respiratory  Effort:  Normal  Auscultation:  Clear without wheezing or rhonchi Cardiovascular  Auscultation:  Regular rate, without rubs, murmurs or gallops  Edema/varicosities:  Not grossly evident Abdominal  Soft,nontender, without masses, guarding or rebound.  Liver/spleen:  No organomegaly noted  Hernia:  None appreciated  Skin  Inspection:  Grossly normal  Palpation:  Grossly normal Neurologic/psychiatric  Orientation:  Normal with appropriate conversation.  Mood/affect:  Normal  Genitourinary    Breasts: Examined lying and sitting.     Right: Without masses, retractions, discharge or axillary adenopathy.     Left: Without masses, retractions, discharge or axillary adenopathy.   Inguinal/mons:  Normal without inguinal adenopathy  External genitalia:  Normal  BUS/Urethra/Skene's glands:  Normal  Bladder:  Normal  Vagina:   Normal  Cervix:  absent Uterus: absent  Adnexa/parametria:     Rt: Without masses or tenderness.   Lt: Without masses or tenderness.  Anus and perineum: Normal  Digital rectal exam: Normal sphincter tone without palpated masses or tenderness  Assessment/Plan:  62 y.o. SBF G3P2 for annual exam with complaint of occasional vaginal itching..    Labs primary care- history of normal metabolic panel, CBC, hepatic function, lipid panel  STD screen/vaginal dryness  Plan: Encouraged vaginal lubricants with intercourse. SBE's, continue annual mammogram, calcium rich diet, vitamin D 2000 daily. Repeat DEXA next year. Reviewed importance of regular exercise, home safety and fall prevention. HIV, hep B., C., RPR. Home Hemoccult card given with instructions. Condoms encouraged. Wet prep negative, prescription for Diflucan 150 to use if needed after antibiotics for scheduled dental work.   Harrington Challenger WHNP, 5:22 PM 03/06/2013

## 2013-03-06 NOTE — Patient Instructions (Signed)

## 2013-03-07 ENCOUNTER — Encounter: Payer: Self-pay | Admitting: Obstetrics and Gynecology

## 2013-03-07 LAB — URINALYSIS W MICROSCOPIC + REFLEX CULTURE
Bacteria, UA: NONE SEEN
Bilirubin Urine: NEGATIVE
Ketones, ur: NEGATIVE mg/dL
Nitrite: NEGATIVE
Protein, ur: NEGATIVE mg/dL
Urobilinogen, UA: 0.2 mg/dL (ref 0.0–1.0)

## 2013-03-18 ENCOUNTER — Other Ambulatory Visit (INDEPENDENT_AMBULATORY_CARE_PROVIDER_SITE_OTHER): Payer: BC Managed Care – PPO

## 2013-03-18 DIAGNOSIS — Z0000 Encounter for general adult medical examination without abnormal findings: Secondary | ICD-10-CM

## 2013-03-18 LAB — POCT URINALYSIS DIPSTICK
Glucose, UA: NEGATIVE
Nitrite, UA: NEGATIVE
Spec Grav, UA: 1.02
Urobilinogen, UA: 0.2

## 2013-03-18 LAB — CBC WITH DIFFERENTIAL/PLATELET
Basophils Relative: 0.3 % (ref 0.0–3.0)
Eosinophils Absolute: 0.1 10*3/uL (ref 0.0–0.7)
HCT: 40.1 % (ref 36.0–46.0)
Hemoglobin: 13.7 g/dL (ref 12.0–15.0)
Lymphocytes Relative: 33.8 % (ref 12.0–46.0)
Lymphs Abs: 2.2 10*3/uL (ref 0.7–4.0)
MCHC: 34.3 g/dL (ref 30.0–36.0)
MCV: 91.3 fl (ref 78.0–100.0)
Monocytes Absolute: 0.5 10*3/uL (ref 0.1–1.0)
Neutro Abs: 3.7 10*3/uL (ref 1.4–7.7)
RBC: 4.39 Mil/uL (ref 3.87–5.11)

## 2013-03-19 LAB — BASIC METABOLIC PANEL
BUN: 13 mg/dL (ref 6–23)
Calcium: 9.4 mg/dL (ref 8.4–10.5)
Chloride: 109 mEq/L (ref 96–112)
Creatinine, Ser: 0.8 mg/dL (ref 0.4–1.2)

## 2013-03-19 LAB — LIPID PANEL
Cholesterol: 156 mg/dL (ref 0–200)
LDL Cholesterol: 66 mg/dL (ref 0–99)

## 2013-03-19 LAB — HEPATIC FUNCTION PANEL
AST: 22 U/L (ref 0–37)
Total Bilirubin: 0.5 mg/dL (ref 0.3–1.2)

## 2013-03-19 LAB — TSH: TSH: 1.77 u[IU]/mL (ref 0.35–5.50)

## 2013-03-21 ENCOUNTER — Other Ambulatory Visit: Payer: Self-pay | Admitting: Anesthesiology

## 2013-03-21 DIAGNOSIS — Z1211 Encounter for screening for malignant neoplasm of colon: Secondary | ICD-10-CM

## 2013-03-25 ENCOUNTER — Ambulatory Visit (INDEPENDENT_AMBULATORY_CARE_PROVIDER_SITE_OTHER): Payer: BC Managed Care – PPO | Admitting: Internal Medicine

## 2013-03-25 ENCOUNTER — Encounter: Payer: Self-pay | Admitting: Internal Medicine

## 2013-03-25 VITALS — BP 126/84 | HR 76 | Temp 98.1°F | Ht 65.5 in | Wt 178.0 lb

## 2013-03-25 DIAGNOSIS — Z0000 Encounter for general adult medical examination without abnormal findings: Secondary | ICD-10-CM

## 2013-03-25 NOTE — Progress Notes (Signed)
Patient ID: Debra Woodward, female   DOB: 04-19-1951, 62 y.o.   MRN: [id number]  cpx  Past Medical History  Diagnosis Date  . ALLERGIC RHINITIS 09/14/2006  . Fibroid     History   Social History  . Marital Status: Single    Spouse Name: N/A    Number of Children: N/A  . Years of Education: N/A   Occupational History  . Not on file.   Social History Main Topics  . Smoking status: Current Every Day Smoker -- 0.25 packs/day    Types: Cigarettes    Last Attempt to Quit: 09/27/2010  . Smokeless tobacco: Not on file  . Alcohol Use: 0.0 oz/week     Comment: Rare  . Drug Use: No  . Sexually Active: Yes    Birth Control/ Protection: Surgical     Comment: HYST   Other Topics Concern  . Not on file   Social History Narrative   Going to [employer]     Past Surgical History  Procedure Laterality Date  . Abdominal hysterectomy    . Knee arthroscopy    . Carpal tunnel release    . Toe surgery    . Breast surgery      Biopsy    Family History  Problem Relation Age of Onset  . Stroke Mother   . Diabetes Mother   . Diabetes Father   . Hypertension Sister     Allergies  Allergen Reactions  . Prednisone Other (See Comments)    hallucinations    Current Outpatient Prescriptions on File Prior to Visit  Medication Sig Dispense Refill  . Cetirizine HCl (ZYRTEC ALLERGY PO) Take by mouth.      . docusate sodium (COLACE) 100 MG capsule Take 100 mg by mouth 3 (three) times a week.       . fluconazole (DIFLUCAN) 150 MG tablet Take 1 tablet (150 mg total) by mouth once.  1 tablet  1  . Multiple Vitamin (MULTIVITAMIN) tablet Take 1 tablet by mouth daily.        . NONFORMULARY OR COMPOUNDED ITEM Boric acid       No current facility-administered medications on file prior to visit.     patient denies chest pain, shortness of breath, orthopnea. Denies lower extremity edema, abdominal pain, change in appetite, change in bowel movements. Patient denies rashes, musculoskeletal complaints.  No other specific complaints in a complete review of systems.   There were no vitals taken for this visit.  Well-developed well-nourished female in no acute distress. HEENT exam atraumatic, normocephalic, extraocular muscles are intact. Neck is supple. No jugular venous distention no thyromegaly. Chest clear to auscultation without increased work of breathing. Cardiac exam S1 and S2 are regular. Abdominal exam active bowel sounds, soft, nontender. Extremities no edema. Neurologic exam she is alert without any motor sensory deficits. Gait is normal.  A/p- Well visit- health maint UTD

## 2013-04-01 ENCOUNTER — Ambulatory Visit
Admission: RE | Admit: 2013-04-01 | Discharge: 2013-04-01 | Disposition: A | Discharge status: Home / Self Care | Payer: BC Managed Care – PPO | Source: Ambulatory Visit | Attending: Women's Health | Admitting: Women's Health

## 2013-04-01 ENCOUNTER — Other Ambulatory Visit: Payer: Self-pay

## 2013-04-01 ENCOUNTER — Ambulatory Visit
Admission: RE | Admit: 2013-04-01 | Discharge: 2013-04-01 | Disposition: A | Discharge status: Home / Self Care | Payer: BC Managed Care – PPO | Source: Ambulatory Visit

## 2013-04-01 DIAGNOSIS — R921 Mammographic calcification found on diagnostic imaging of breast: Secondary | ICD-10-CM

## 2013-05-28 ENCOUNTER — Telehealth: Payer: Self-pay | Admitting: Internal Medicine

## 2013-05-28 MED ORDER — CELECOXIB 200 MG PO CAPS: 200.0000 mg | ORAL_CAPSULE | Freq: Two times a day (BID) | ORAL | Status: DC

## 2013-05-28 NOTE — Telephone Encounter (Signed)
Caller: Debra Woodward/Patient; Phone: [phone]; Reason for Call: Pt was given Celebrex 200mg  samples in December.  Pt takes 1 q 6-8 weeks .  She is out of her samples now and wants to know if MD would give her more samples.  She does not want a script/just samples.  Could office please call her back

## 2013-05-28 NOTE — Telephone Encounter (Signed)
Samples up front for p/u, pt aware 

## 2013-09-03 ENCOUNTER — Encounter: Payer: Self-pay | Admitting: Family

## 2013-09-03 ENCOUNTER — Ambulatory Visit (INDEPENDENT_AMBULATORY_CARE_PROVIDER_SITE_OTHER): Payer: BC Managed Care – PPO | Admitting: Family

## 2013-09-03 VITALS — BP 140/80 | HR 85 | Wt 185.0 lb

## 2013-09-03 DIAGNOSIS — R635 Abnormal weight gain: Secondary | ICD-10-CM

## 2013-09-03 DIAGNOSIS — F430 Acute stress reaction: Secondary | ICD-10-CM

## 2013-09-03 LAB — COMPREHENSIVE METABOLIC PANEL
ALT: 19 U/L (ref 0–35)
CO2: 27 mEq/L (ref 19–32)
Calcium: 9.9 mg/dL (ref 8.4–10.5)
Chloride: 106 mEq/L (ref 96–112)
GFR: 90.87 mL/min (ref >60.00)
Sodium: 142 mEq/L (ref 135–145)
Total Protein: 7 g/dL (ref 6.0–8.3)

## 2013-09-03 LAB — CBC WITH DIFFERENTIAL/PLATELET
Basophils Absolute: 0 10*3/uL (ref 0.0–0.1)
Basophils Relative: 0.3 % (ref 0.0–3.0)
Eosinophils Relative: 2.4 % (ref 0.0–5.0)
Lymphocytes Relative: 31.7 % (ref 12.0–46.0)
Lymphs Abs: 2 10*3/uL (ref 0.7–4.0)
Neutrophils Relative %: 57.8 % (ref 43.0–77.0)
RBC: 4.62 Mil/uL (ref 3.87–5.11)
RDW: 14.5 % (ref 11.5–14.6)
WBC: 6.3 10*3/uL (ref 4.5–10.5)

## 2013-09-03 LAB — TSH: TSH: 1.57 u[IU]/mL (ref 0.35–5.50)

## 2013-09-03 NOTE — Progress Notes (Signed)
Subjective:    Patient ID: Debra Woodward, female    DOB: [birth year], 62 y.o.   MRN: [id number]  HPI 62 year old female, patient of Dr. Abelina Bachelor is in today complaining of a pulsating sensation to the left parietal area of her head has been ongoing x1 year but worsening over the past 3-4 months. She reports the sensation lasting about 5-10 seconds occurring about 2-3 times a week at this point. Reports increased stress with school. Has a history of GERD ago and had some dizziness at times but denies any blurred vision, double vision, chest pain or palpitations. Patient also has concerns that she is gaining weight recently. Reports she is exercising but is not walking due to having a heel spur.  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 Reports not getting much sleep.  Review of Systems  Constitutional: Negative.   HENT: Negative.   Respiratory: Negative.   Cardiovascular: Negative.   Gastrointestinal: Negative.   Endocrine: Negative.   Genitourinary: Negative.   Musculoskeletal: Negative.   Skin: Negative.   Neurological: Negative.        Pulsations in her head  Psychiatric/Behavioral: Negative.    Past Medical History  Diagnosis Date  . ALLERGIC RHINITIS 09/14/2006  . Fibroid     History   Social History  . Marital Status: Single    Spouse Name: N/A    Number of Children: N/A  . Years of Education: N/A   Occupational History  . Not on file.   Social History Main Topics  . Smoking status: Current Every Day Smoker -- 0.25 packs/day    Types: Cigarettes  . Smokeless tobacco: Not on file  . Alcohol Use: 0.0 oz/week     Comment: Rare  . Drug Use: No  . Sexual Activity: Yes    Birth Control/ Protection: Surgical     Comment: HYST   Other Topics Concern  . Not on file   Social History Narrative   Going to [facility]     Past Surgical History  Procedure Laterality Date  . Abdominal hysterectomy    . Knee arthroscopy    . Carpal tunnel release    .  Toe surgery    . Breast surgery      Biopsy    Family History  Problem Relation Age of Onset  . Stroke Mother   . Diabetes Mother   . Diabetes Father   . Hypertension Sister     Allergies  Allergen Reactions  . Prednisone Other (See Comments)    hallucinations    Current Outpatient Prescriptions on File Prior to Visit  Medication Sig Dispense Refill  . carboxymethylcellulose 1 % ophthalmic solution Apply 1 drop to eye daily.      . celecoxib (CELEBREX) 200 MG capsule Take 1 capsule (200 mg total) by mouth 2 (two) times daily.  30 capsule  0  . Cetirizine HCl (ZYRTEC ALLERGY PO) Take by mouth.      Marland Kitchen Cod Liver Oil CAPS Take 1 capsule by mouth daily.      . diclofenac sodium (VOLTAREN) 1 % GEL Apply 4 g topically 2 (two) times daily.      Marland Kitchen docusate sodium (COLACE) 100 MG capsule Take 100 mg by mouth 3 (three) times a week.       . fexofenadine (ALLEGRA) 180 MG tablet Take 180 mg by mouth daily.      . Glucosamine-Chondroitin 1500-1200 MG/30ML LIQD Take 1 Bottle by mouth daily.      Marland Kitchen  loteprednol (LOTEMAX) 0.2 % SUSP Place 1 drop into both eyes daily.      . Multiple Vitamin (MULTIVITAMIN) tablet Take 1 tablet by mouth daily.        . Olopatadine HCl (PATADAY) 0.2 % SOLN Apply 1 drop to eye daily.       No current facility-administered medications on file prior to visit.    BP 140/80  Pulse 85  Wt 185 lb (83.915 kg)  BMI 30.31 kg/m2chart    Objective:   Physical Exam  Constitutional: She is oriented to person, place, and time. She appears well-developed and well-nourished.  HENT:  Right Ear: External ear normal.  Left Ear: External ear normal.  Nose: Nose normal.  Mouth/Throat: Oropharynx is clear and moist.  Neck: Normal range of motion. Neck supple. No thyromegaly present.  Cardiovascular: Normal rate, regular rhythm and normal heart sounds.   Pulmonary/Chest: Effort normal and breath sounds normal.  Abdominal: Soft. Bowel sounds are normal.  Musculoskeletal:  Normal range of motion.  Neurological: She is alert and oriented to person, place, and time.  Skin: Skin is warm and dry.  Psychiatric: She has a normal mood and affect.          Assessment & Plan:  Assessment: 1. Acute stress reaction 2. Numbness to the scalp 3. Weight gain  Plan: Labs and to include CBC, CMP and TSH we'll notify patient of results. Encouraged exercise to help reduce stress levels. Considering her anxiety medication if not better.

## 2013-09-03 NOTE — Patient Instructions (Signed)
1. BLACK GIRLS RUN, CHECK INTO THIS TO STAY PHYSICALLY ACTIVE.  Exercise to Lose Weight Exercise and a healthy diet may help you lose weight. Your doctor may suggest specific exercises. EXERCISE IDEAS AND TIPS  Choose low-cost things you enjoy doing, such as walking, bicycling, or exercising to workout videos.  Take stairs instead of the elevator.  Walk during your lunch break.  Park your car further away from work or school.  Go to a gym or an exercise class.  Start with 5 to 10 minutes of exercise each day. Build up to 30 minutes of exercise 4 to 6 days a week.  Wear shoes with good support and comfortable clothes.  Stretch before and after working out.  Work out until you breathe harder and your heart beats faster.  Drink extra water when you exercise.  Do not do so much that you hurt yourself, feel dizzy, or get very short of breath. Exercises that burn about 150 calories:  Running 1  miles in 15 minutes.  Playing volleyball for 45 to 60 minutes.  Washing and waxing a car for 45 to 60 minutes.  Playing touch football for 45 minutes.  Walking 1  miles in 35 minutes.  Pushing a stroller 1  miles in 30 minutes.  Playing basketball for 30 minutes.  Raking leaves for 30 minutes.  Bicycling 5 miles in 30 minutes.  Walking 2 miles in 30 minutes.  Dancing for 30 minutes.  Shoveling snow for 15 minutes.  Swimming laps for 20 minutes.  Walking up stairs for 15 minutes.  Bicycling 4 miles in 15 minutes.  Gardening for 30 to 45 minutes.  Jumping rope for 15 minutes.  Washing windows or floors for 45 to 60 minutes. Document Released: 12/03/2010 Document Revised: 01/23/2012 Document Reviewed: 12/03/2010 [facility] Patient Information 2014 [place], [place].

## 2013-09-16 ENCOUNTER — Encounter: Payer: Self-pay | Admitting: Gynecology

## 2013-09-16 ENCOUNTER — Ambulatory Visit (INDEPENDENT_AMBULATORY_CARE_PROVIDER_SITE_OTHER): Payer: BC Managed Care – PPO | Admitting: Gynecology

## 2013-09-16 ENCOUNTER — Other Ambulatory Visit (HOSPITAL_COMMUNITY)
Admission: RE | Admit: 2013-09-16 | Discharge: 2013-09-16 | Disposition: A | Discharge status: Home / Self Care | Payer: BC Managed Care – PPO | Source: Ambulatory Visit | Attending: Gynecology | Admitting: Gynecology

## 2013-09-16 VITALS — BP 140/90

## 2013-09-16 DIAGNOSIS — N6459 Other signs and symptoms in breast: Secondary | ICD-10-CM | POA: Insufficient documentation

## 2013-09-16 DIAGNOSIS — R350 Frequency of micturition: Secondary | ICD-10-CM

## 2013-09-16 DIAGNOSIS — N643 Galactorrhea not associated with childbirth: Secondary | ICD-10-CM

## 2013-09-16 LAB — URINALYSIS W MICROSCOPIC + REFLEX CULTURE
Bilirubin Urine: NEGATIVE
Glucose, UA: NEGATIVE mg/dL
Hgb urine dipstick: NEGATIVE
Specific Gravity, Urine: 1.025 (ref 1.005–1.030)

## 2013-09-17 ENCOUNTER — Telehealth: Payer: Self-pay | Admitting: *Deleted

## 2013-09-17 DIAGNOSIS — N643 Galactorrhea not associated with childbirth: Secondary | ICD-10-CM

## 2013-09-17 LAB — URINE CULTURE: Colony Count: NO GROWTH

## 2013-09-17 LAB — PROLACTIN: Prolactin: 4.4 ng/mL

## 2013-09-17 NOTE — Telephone Encounter (Signed)
Orders placed for the below note, breast center will contact pt with time and date.

## 2013-09-17 NOTE — Progress Notes (Signed)
The patient is a 62 year old who presented to the office complaining of a yellowish 2 red left nipple discharge. She stated that she noted her brought up blood stained. The patient denies any recent trauma or injury. She did not palpating masses per se. She had a normal mammogram in May of this year. Patient does have family history of breast cancer. Patient is also complaining that sometimes she doesn't feel like she empties completely when she voids although she denied any dysuria, frequency, back pain, fever, chills, nausea, or vomiting.  Review of patient's records indicated that she has had a history of sclerosing papilloma of the left breast. Patient has never been on hormone replacement therapy. Patient has a history of total, hysterectomy for fibroids in 1996. She does smoke approximately 5 cigarettes per day.  Exam: Both breasts are examined sitting supine position there was no skin discoloration or nipple inversion no palpable masses or tenderness no supraclavicular axillary lymphadenopathy. On pressure on the areolar region note she like material extruded from the left nipple. A slide was made and submitted for cytological evaluation.  Assessment/plan: Unilateral left breast bloody galactorrhea. A slide for cytology submitted result pending. Patient will be referred to the radiology service for diagnostic mammogram and ultrasound and possibly a ductogram. Will await the results of the recommendations. Her urinalysis was negative and was submitted for culture. A prolactin blood test was ordered.

## 2013-09-17 NOTE — Telephone Encounter (Signed)
Message copied by Aura Camps on Tue Sep 17, 2013  9:33 AM ------      Message from: Keenan Bachelor      Created: Mon Sep 16, 2013  4:02 PM                   ----- Message -----         From: Ok Edwards, MD         Sent: 09/16/2013   3:59 PM           To: Keenan Bachelor            Please schedule diagnostic mammogram/ultrasound of left breast with possible ductogram on this patient with unilateral/bloody galactorrhea. Last mammogram in May 2014. ------

## 2013-09-18 NOTE — Telephone Encounter (Signed)
appt 10/07/13 @ 1;00 pm

## 2013-10-01 ENCOUNTER — Ambulatory Visit (INDEPENDENT_AMBULATORY_CARE_PROVIDER_SITE_OTHER): Payer: BC Managed Care – PPO

## 2013-10-01 ENCOUNTER — Encounter: Payer: Self-pay | Admitting: Podiatry

## 2013-10-01 ENCOUNTER — Ambulatory Visit (INDEPENDENT_AMBULATORY_CARE_PROVIDER_SITE_OTHER): Payer: BC Managed Care – PPO | Admitting: Podiatry

## 2013-10-01 VITALS — BP 140/86 | HR 89 | Resp 16 | Ht 65.0 in | Wt 177.0 lb

## 2013-10-01 DIAGNOSIS — M79609 Pain in unspecified limb: Secondary | ICD-10-CM

## 2013-10-01 DIAGNOSIS — M79672 Pain in left foot: Secondary | ICD-10-CM

## 2013-10-01 DIAGNOSIS — M7660 Achilles tendinitis, unspecified leg: Secondary | ICD-10-CM

## 2013-10-01 NOTE — Progress Notes (Signed)
Raja presents today as a 62 year old black female with a chief complaint of pain with an osteophyte to the posterior medial aspect of her left heel. States has been present for quite some time is becoming more more painful as time goes on. She relates it to shoe gear. States that she's unable to wear regular shoes that rub the back of her by mouth. Her heel cups are wearing out.  Objective: I have reviewed her past medical history medications and allergies. Vital signs are stable and she is alert and oriented x3. Pulses are strongly palpable bilateral. Neurologic sensorium is intact bilateral. Deep tendon reflexes are intact bilateral. Muscle strength is normal bilateral. Orthopedic evaluation demonstrates pain on palpation to the posterior medial aspect of the left heel. There is no pain on medial lateral compression of the calcaneus. Radiographic evaluation does demonstrate a posterior proximally oriented calcaneal heel spur with tendo Achilles thickening of the left heel.  Assessment: Insertional tendo Achilles tendinitis with heel spur left.  Plan: Injection to the point of maximal tenderness of the left heel. I will followup with her in a few weeks. She was also dispensed a silicone sleeve.

## 2013-10-01 NOTE — Patient Instructions (Signed)

## 2013-10-07 ENCOUNTER — Ambulatory Visit
Admission: RE | Admit: 2013-10-07 | Discharge: 2013-10-07 | Disposition: A | Discharge status: Home / Self Care | Payer: BC Managed Care – PPO | Source: Ambulatory Visit | Attending: Gynecology | Admitting: Gynecology

## 2013-10-07 DIAGNOSIS — N643 Galactorrhea not associated with childbirth: Secondary | ICD-10-CM

## 2013-10-31 ENCOUNTER — Ambulatory Visit: Payer: BC Managed Care – PPO | Admitting: Podiatry

## 2013-12-23 ENCOUNTER — Other Ambulatory Visit: Payer: BC Managed Care – PPO

## 2013-12-30 ENCOUNTER — Encounter: Payer: BC Managed Care – PPO | Admitting: Internal Medicine

## 2014-01-29 ENCOUNTER — Other Ambulatory Visit (INDEPENDENT_AMBULATORY_CARE_PROVIDER_SITE_OTHER): Payer: BC Managed Care – PPO

## 2014-01-29 DIAGNOSIS — Z0000 Encounter for general adult medical examination without abnormal findings: Secondary | ICD-10-CM

## 2014-01-29 LAB — LIPID PANEL
CHOLESTEROL: 144 mg/dL (ref 0–200)
HDL: 70.7 mg/dL (ref >39.00)
LDL Cholesterol: 63 mg/dL (ref 0–99)
Total CHOL/HDL Ratio: 2
Triglycerides: 53 mg/dL (ref 0.0–149.0)
VLDL: 10.6 mg/dL (ref 0.0–40.0)

## 2014-01-29 LAB — CBC WITH DIFFERENTIAL/PLATELET
BASOS PCT: 0.3 % (ref 0.0–3.0)
Basophils Absolute: 0 10*3/uL (ref 0.0–0.1)
EOS ABS: 0.2 10*3/uL (ref 0.0–0.7)
EOS PCT: 3.4 % (ref 0.0–5.0)
HEMATOCRIT: 41.5 % (ref 36.0–46.0)
HEMOGLOBIN: 13.8 g/dL (ref 12.0–15.0)
LYMPHS ABS: 2.5 10*3/uL (ref 0.7–4.0)
Lymphocytes Relative: 39.7 % (ref 12.0–46.0)
MCHC: 33.3 g/dL (ref 30.0–36.0)
MCV: 92.8 fl (ref 78.0–100.0)
MONO ABS: 0.5 10*3/uL (ref 0.1–1.0)
Monocytes Relative: 7.2 % (ref 3.0–12.0)
NEUTROS ABS: 3.1 10*3/uL (ref 1.4–7.7)
Neutrophils Relative %: 49.4 % (ref 43.0–77.0)
Platelets: 321 10*3/uL (ref 150.0–400.0)
RBC: 4.47 Mil/uL (ref 3.87–5.11)
RDW: 14.1 % (ref 11.5–14.6)
WBC: 6.3 10*3/uL (ref 4.5–10.5)

## 2014-01-29 LAB — BASIC METABOLIC PANEL
BUN: 11 mg/dL (ref 6–23)
CALCIUM: 9.5 mg/dL (ref 8.4–10.5)
CO2: 28 mEq/L (ref 19–32)
CREATININE: 0.7 mg/dL (ref 0.4–1.2)
Chloride: 107 mEq/L (ref 96–112)
GFR: 103.78 mL/min (ref >60.00)
GLUCOSE: 89 mg/dL (ref 70–99)
Potassium: 3.9 mEq/L (ref 3.5–5.1)
Sodium: 141 mEq/L (ref 135–145)

## 2014-01-29 LAB — POCT URINALYSIS DIPSTICK
Bilirubin, UA: NEGATIVE
Blood, UA: NEGATIVE
GLUCOSE UA: NEGATIVE
KETONES UA: NEGATIVE
Nitrite, UA: NEGATIVE
SPEC GRAV UA: 1.025
Urobilinogen, UA: 0.2
pH, UA: 6

## 2014-01-29 LAB — HEPATIC FUNCTION PANEL
ALBUMIN: 4 g/dL (ref 3.5–5.2)
ALK PHOS: 81 U/L (ref 39–117)
ALT: 15 U/L (ref 0–35)
AST: 16 U/L (ref 0–37)
Bilirubin, Direct: 0 mg/dL (ref 0.0–0.3)
Total Bilirubin: 0.4 mg/dL (ref 0.3–1.2)
Total Protein: 6.5 g/dL (ref 6.0–8.3)

## 2014-01-29 LAB — TSH: TSH: 1.68 u[IU]/mL (ref 0.35–5.50)

## 2014-02-05 ENCOUNTER — Encounter: Payer: Self-pay | Admitting: Internal Medicine

## 2014-02-05 ENCOUNTER — Ambulatory Visit (INDEPENDENT_AMBULATORY_CARE_PROVIDER_SITE_OTHER): Payer: BC Managed Care – PPO | Admitting: Internal Medicine

## 2014-02-05 ENCOUNTER — Telehealth: Payer: Self-pay | Admitting: Internal Medicine

## 2014-02-05 VITALS — BP 122/80 | HR 80 | Temp 98.1°F | Ht 65.0 in | Wt 185.0 lb

## 2014-02-05 DIAGNOSIS — Z0000 Encounter for general adult medical examination without abnormal findings: Secondary | ICD-10-CM

## 2014-02-05 DIAGNOSIS — R030 Elevated blood-pressure reading, without diagnosis of hypertension: Secondary | ICD-10-CM

## 2014-02-05 NOTE — Progress Notes (Signed)
cpx Past Medical History  Diagnosis Date  . ALLERGIC RHINITIS 09/14/2006  . Fibroid     History   Social History  . Marital Status: Single    Spouse Name: N/A    Number of Children: N/A  . Years of Education: N/A   Occupational History  . Not on file.   Social History Main Topics  . Smoking status: Current Every Day Smoker -- 0.25 packs/day    Types: Cigarettes  . Smokeless tobacco: Not on file  . Alcohol Use: 0.0 oz/week     Comment: Rare  . Drug Use: No  . Sexual Activity: Yes    Birth Control/ Protection: Surgical     Comment: HYST   Other Topics Concern  . Not on file   Social History Narrative   Going to [employer]     Past Surgical History  Procedure Laterality Date  . Abdominal hysterectomy    . Knee arthroscopy    . Carpal tunnel release    . Toe surgery    . Breast surgery      Biopsy    Family History  Problem Relation Age of Onset  . Stroke Mother   . Diabetes Mother   . Diabetes Father   . Hypertension Sister     Allergies  Allergen Reactions  . Prednisone Other (See Comments)    hallucinations    Current Outpatient Prescriptions on File Prior to Visit  Medication Sig Dispense Refill  . amoxicillin-clavulanate (AUGMENTIN) 875-125 MG per tablet Take 1 tablet by mouth 2 (two) times daily.      . carboxymethylcellulose 1 % ophthalmic solution Apply 1 drop to eye daily.      . celecoxib (CELEBREX) 200 MG capsule Take 1 capsule (200 mg total) by mouth 2 (two) times daily.  30 capsule  0  . cephALEXin (KEFLEX) 500 MG capsule Take 500 mg by mouth 4 (four) times daily.      . Cetirizine HCl (ZYRTEC ALLERGY PO) Take by mouth.      Marland Kitchen Cod Liver Oil CAPS Take 1 capsule by mouth daily.      Marland Kitchen docusate sodium (COLACE) 100 MG capsule Take 100 mg by mouth 3 (three) times a week.       . fexofenadine (ALLEGRA) 180 MG tablet Take 180 mg by mouth daily.      Marland Kitchen loteprednol (LOTEMAX) 0.2 % SUSP Place 1 drop into both eyes daily.      . Multiple Vitamin  (MULTIVITAMIN) tablet Take 1 tablet by mouth daily.        . Olopatadine HCl (PATADAY) 0.2 % SOLN Apply 1 drop to eye daily.       No current facility-administered medications on file prior to visit.     patient denies chest pain, shortness of breath, orthopnea. Denies lower extremity edema, abdominal pain, change in appetite, change in bowel movements. Patient denies rashes, musculoskeletal complaints. No other specific complaints in a complete review of systems.   BP 122/80  Pulse 80  Temp(Src) 98.1 F (36.7 C) (Oral)  Ht 5\' 5"  (1.651 m)  Wt 185 lb (83.915 kg)  BMI 30.79 kg/m2  Well-developed well-nourished female in no acute distress. HEENT exam atraumatic, normocephalic, extraocular muscles are intact. Neck is supple. No jugular venous distention no thyromegaly. Chest clear to auscultation without increased work of breathing. Cardiac exam S1 and S2 are regular. Abdominal exam active bowel sounds, soft, nontender. Extremities no edema. Neurologic exam she is alert without any motor sensory deficits.  Gait is normal.  Well visit- health maint utd

## 2014-02-05 NOTE — Telephone Encounter (Signed)
Relevant patient education mailed to patient.  

## 2014-02-05 NOTE — Progress Notes (Signed)
Pre visit review using our clinic review tool, if applicable. No additional management support is needed unless otherwise documented below in the visit note. 

## 2014-03-03 ENCOUNTER — Other Ambulatory Visit: Payer: Self-pay

## 2014-03-03 DIAGNOSIS — Z1231 Encounter for screening mammogram for malignant neoplasm of breast: Secondary | ICD-10-CM

## 2014-03-07 ENCOUNTER — Ambulatory Visit (INDEPENDENT_AMBULATORY_CARE_PROVIDER_SITE_OTHER): Payer: BC Managed Care – PPO | Admitting: Women's Health

## 2014-03-07 ENCOUNTER — Encounter: Payer: Self-pay | Admitting: Women's Health

## 2014-03-07 VITALS — BP 120/78 | Ht 64.0 in | Wt 190.0 lb

## 2014-03-07 DIAGNOSIS — N898 Other specified noninflammatory disorders of vagina: Secondary | ICD-10-CM

## 2014-03-07 DIAGNOSIS — Z01419 Encounter for gynecological examination (general) (routine) without abnormal findings: Secondary | ICD-10-CM

## 2014-03-07 LAB — URINALYSIS W MICROSCOPIC + REFLEX CULTURE
Bilirubin Urine: NEGATIVE
CRYSTALS: NONE SEEN
Casts: NONE SEEN
Glucose, UA: NEGATIVE mg/dL
Hgb urine dipstick: NEGATIVE
Ketones, ur: NEGATIVE mg/dL
LEUKOCYTES UA: NEGATIVE
NITRITE: NEGATIVE
Protein, ur: NEGATIVE mg/dL
SPECIFIC GRAVITY, URINE: 1.022 (ref 1.005–1.030)
Urobilinogen, UA: 0.2 mg/dL (ref 0.0–1.0)
pH: 6.5 (ref 5.0–8.0)

## 2014-03-07 LAB — WET PREP FOR TRICH, YEAST, CLUE
Clue Cells Wet Prep HPF POC: NONE SEEN
Trich, Wet Prep: NONE SEEN
WBC WET PREP: NONE SEEN
Yeast Wet Prep HPF POC: NONE SEEN

## 2014-03-07 NOTE — Progress Notes (Signed)
Debra Woodward [birth year] [id number]    History:    Presents for annual exam. Postmenopausal on no HRT/TAH/fibroids-1996. Quit smoking in 2011 and is now smoking 3-5 cigarettes daily. Negative colonoscopy 2006. Has fibrocystic breast, mammogram with ultrasound 03/2013 for galactorrhea showed calcifications of milk ducts. Has had both Zostavac and Pneumovax vaccines at primary care. Normal Pap history. Normal DEXA 08/2009, T score bilateral hip average -0.2. Has had 20 lb weight gain in past year likely secondary to sedentary lifestyle.  Past medical history, past surgical history, family history and social history were all reviewed and documented in the EPIC chart. 2 grown children doing well. 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 Numerous family members with diabetes and hypertension.  ROS:  A  ROS was performed and pertinent positives and negatives are included.  Exam:  Filed Vitals:   03/07/14 0842  BP: 120/78    General appearance:  Normal Thyroid:  Symmetrical, normal in size, without palpable masses or nodularity. Respiratory  Auscultation:  Clear without wheezing or rhonchi Cardiovascular  Auscultation:  Regular rate, without rubs, murmurs or gallops  Edema/varicosities:  Not grossly evident Abdominal  Soft,nontender, without masses, guarding or rebound.  Liver/spleen:  No organomegaly noted  Hernia:  None appreciated  Skin  Inspection:  Grossly normal   Breasts: Examined lying and sitting.     Right: Without masses, retractions, discharge or axillary adenopathy. Biopsy scar on nipple.     Left: Without masses, retractions, discharge or axillary adenopathy. Gentitourinary   Inguinal/mons:  Normal without inguinal adenopathy  External genitalia:  Normal  BUS/Urethra/Skene's glands:  Normal  Vagina:  Atrophic. Wet prep negative.  Cervix:  absent  Uterus:  absent  Adnexa/parametria:     Rt: Without masses or tenderness.   Lt: Without masses or  tenderness.  Anus and perineum: Normal  Digital rectal exam: Normal sphincter tone without palpated masses or tenderness  Assessment/Plan:  63 y.o. SBF G3P2 for annual exam with complaint of occasional vaginal itching.  Labs primary care- history of normal metabolic panel, CBC, hepatic function, lipid panel, TSH  Vaginal dryness  TAH for fibroid/no HRT  Plan: Encouraged vaginal lubricants with intercourse. SBE's, continue annual mammogram, calcium rich diet, vitamin D 2000 daily.  DEXA scheduled. Reviewed importance of regular exercise and decreased calorie intake for weight loss, home safety and fall prevention. Colonoscopy next year. Counseled on importance of smoking cessation. UA pending.  [place], 9:21 AM 03/07/2014

## 2014-03-07 NOTE — Patient Instructions (Signed)
Health Recommendations for Postmenopausal Women Respected and ongoing research has looked at the most common causes of death, disability, and poor quality of life in postmenopausal women. The causes include heart disease, diseases of blood vessels, diabetes, depression, cancer, and bone loss (osteoporosis). Many things can be done to help lower the chances of developing these and other common problems: CARDIOVASCULAR DISEASE Heart Disease: A heart attack is a medical emergency. Know the signs and symptoms of a heart attack. Below are things women can do to reduce their risk for heart disease.   Do not smoke. If you smoke, quit.  Aim for a healthy weight. Being overweight causes many preventable deaths. Eat a healthy and balanced diet and drink an adequate amount of liquids.  Get moving. Make a commitment to be more physically active. Aim for 30 minutes of activity on most, if not all days of the week.  Eat for heart health. Choose a diet that is low in saturated fat and cholesterol and eliminate trans fat. Include whole grains, vegetables, and fruits. Read and understand the labels on food containers before buying.  Know your numbers. Ask your caregiver to check your blood pressure, cholesterol (total, HDL, LDL, triglycerides) and blood glucose. Work with your caregiver on improving your entire clinical picture.  High blood pressure. Limit or stop your table salt intake (try salt substitute and food seasonings). Avoid salty foods and drinks. Read labels on food containers before buying. Eating well and exercising can help control high blood pressure. STROKE  Stroke is a medical emergency. Stroke may be the result of a blood clot in a blood vessel in the brain or by a brain hemorrhage (bleeding). Know the signs and symptoms of a stroke. To lower the risk of developing a stroke:  Avoid fatty foods.  Quit smoking.  Control your diabetes, blood pressure, and irregular heart rate. THROMBOPHLEBITIS  (BLOOD CLOT) OF THE LEG  Becoming overweight and leading a stationary lifestyle may also contribute to developing blood clots. Controlling your diet and exercising will help lower the risk of developing blood clots. CANCER SCREENING  Breast Cancer: Take steps to reduce your risk of breast cancer.  You should practice "breast self-awareness." This means understanding the normal appearance and feel of your breasts and should include breast self-examination. Any changes detected, no matter how small, should be reported to your caregiver.  After age 40, you should have a clinical breast exam (CBE) every year.  Starting at age 40, you should consider having a mammogram (breast X-ray) every year.  If you have a family history of breast cancer, talk to your caregiver about genetic screening.  If you are at high risk for breast cancer, talk to your caregiver about having an MRI and a mammogram every year.  Intestinal or Stomach Cancer: Tests to consider are a rectal exam, fecal occult blood, sigmoidoscopy, and colonoscopy. Women who are high risk may need to be screened at an earlier age and more often.  Cervical Cancer:  Beginning at age 30, you should have a Pap test every 3 years as long as the past 3 Pap tests have been normal.  If you have had past treatment for cervical cancer or a condition that could lead to cancer, you need Pap tests and screening for cancer for at least 20 years after your treatment.  If you had a hysterectomy for a problem that was not cancer or a condition that could lead to cancer, then you no longer need Pap tests.    If you are between ages 65 and 70, and you have had normal Pap tests going back 10 years, you no longer need Pap tests.  If Pap tests have been discontinued, risk factors (such as a new sexual partner) need to be reassessed to determine if screening should be resumed.  Some medical problems can increase the chance of getting cervical cancer. In these  cases, your caregiver may recommend more frequent screening and Pap tests.  Uterine Cancer: If you have vaginal bleeding after reaching menopause, you should notify your caregiver.  Ovarian cancer: Other than yearly pelvic exams, there are no reliable tests available to screen for ovarian cancer at this time except for yearly pelvic exams.  Lung Cancer: Yearly chest X-rays can detect lung cancer and should be done on high risk women, such as cigarette smokers and women with chronic lung disease (emphysema).  Skin Cancer: A complete body skin exam should be done at your yearly examination. Avoid overexposure to the sun and ultraviolet light lamps. Use a strong sun block cream when in the sun. All of these things are important in lowering the risk of skin cancer. MENOPAUSE Menopause Symptoms: Hormone therapy products are effective for treating symptoms associated with menopause:  Moderate to severe hot flashes.  Night sweats.  Mood swings.  Headaches.  Tiredness.  Loss of sex drive.  Insomnia.  Other symptoms. Hormone replacement carries certain risks, especially in older women. Women who use or are thinking about using estrogen or estrogen with progestin treatments should discuss that with their caregiver. Your caregiver will help you understand the benefits and risks. The ideal dose of hormone replacement therapy is not known. The Food and Drug Administration (FDA) has concluded that hormone therapy should be used only at the lowest doses and for the shortest amount of time to reach treatment goals.  OSTEOPOROSIS Protecting Against Bone Loss and Preventing Fracture: If you use hormone therapy for prevention of bone loss (osteoporosis), the risks for bone loss must outweigh the risk of the therapy. Ask your caregiver about other medications known to be safe and effective for preventing bone loss and fractures. To guard against bone loss or fractures, the following is recommended:  If  you are less than age 50, take 1000 mg of calcium and at least 600 mg of Vitamin D per day.  If you are greater than age 50 but less than age 70, take 1200 mg of calcium and at least 600 mg of Vitamin D per day.  If you are greater than age 70, take 1200 mg of calcium and at least 800 mg of Vitamin D per day. Smoking and excessive alcohol intake increases the risk of osteoporosis. Eat foods rich in calcium and vitamin D and do weight bearing exercises several times a week as your caregiver suggests. DIABETES Diabetes Melitus: If you have Type I or Type 2 diabetes, you should keep your blood sugar under control with diet, exercise and recommended medication. Avoid too many sweets, starchy and fatty foods. Being overweight can make control more difficult. COGNITION AND MEMORY Cognition and Memory: Menopausal hormone therapy is not recommended for the prevention of cognitive disorders such as Alzheimer's disease or memory loss.  DEPRESSION  Depression may occur at any age, but is common in elderly women. The reasons may be because of physical, medical, social (loneliness), or financial problems and needs. If you are experiencing depression because of medical problems and control of symptoms, talk to your caregiver about this. Physical activity and   exercise may help with mood and sleep. Community and volunteer involvement may help your sense of value and worth. If you have depression and you feel that the problem is getting worse or becoming severe, talk to your caregiver about treatment options that are best for you. ACCIDENTS  Accidents are common and can be serious in the elderly woman. Prepare your house to prevent accidents. Eliminate throw rugs, place hand bars in the bath, shower and toilet areas. Avoid wearing high heeled shoes or walking on wet, snowy, and icy areas. Limit or stop driving if you have vision or hearing problems, or you feel you are unsteady with you movements and  reflexes. HEPATITIS C Hepatitis C is a type of viral infection affecting the liver. It is spread mainly through contact with blood from an infected person. It can be treated, but if left untreated, it can lead to severe liver damage over years. Many people who are infected do not know that the virus is in their blood. If you are a "baby-boomer", it is recommended that you have one screening test for Hepatitis C. IMMUNIZATIONS  Several immunizations are important to consider having during your senior years, including:   Tetanus, diptheria, and pertussis booster shot.  Influenza every year before the flu season begins.  Pneumonia vaccine.  Shingles vaccine.  Others as indicated based on your specific needs. Talk to your caregiver about these. Document Released: 12/23/2005 Document Revised: 10/17/2012 Document Reviewed: 08/18/2008 [employer] Patient Information 2014 [employer].  

## 2014-04-02 ENCOUNTER — Ambulatory Visit
Admission: RE | Admit: 2014-04-02 | Discharge: 2014-04-02 | Disposition: A | Discharge status: Home / Self Care | Payer: BC Managed Care – PPO | Source: Ambulatory Visit

## 2014-04-02 DIAGNOSIS — Z1231 Encounter for screening mammogram for malignant neoplasm of breast: Secondary | ICD-10-CM

## 2014-09-15 ENCOUNTER — Encounter: Payer: Self-pay | Admitting: Women's Health

## 2014-10-07 ENCOUNTER — Ambulatory Visit (INDEPENDENT_AMBULATORY_CARE_PROVIDER_SITE_OTHER): Payer: BC Managed Care – PPO | Admitting: Internal Medicine

## 2014-10-07 ENCOUNTER — Telehealth: Payer: Self-pay | Admitting: Internal Medicine

## 2014-10-07 ENCOUNTER — Encounter: Payer: Self-pay | Admitting: Internal Medicine

## 2014-10-07 ENCOUNTER — Ambulatory Visit: Payer: BC Managed Care – PPO | Admitting: Nurse Practitioner

## 2014-10-07 VITALS — BP 144/88 | Temp 98.0°F | Wt 192.9 lb

## 2014-10-07 DIAGNOSIS — R21 Rash and other nonspecific skin eruption: Secondary | ICD-10-CM

## 2014-10-07 DIAGNOSIS — H02846 Edema of left eye, unspecified eyelid: Secondary | ICD-10-CM

## 2014-10-07 MED ORDER — VALACYCLOVIR HCL 1 G PO TABS: 1000.0000 mg | ORAL_TABLET | Freq: Three times a day (TID) | ORAL | Status: DC

## 2014-10-07 MED ORDER — DESONIDE 0.05 % EX CREA: TOPICAL_CREAM | Freq: Two times a day (BID) | CUTANEOUS | Status: DC

## 2014-10-07 NOTE — Telephone Encounter (Signed)
Patient Information:  Caller Name: Shatia  Phone: [phone]  Patient: Debra Woodward, Debra Woodward  Gender: Female  DOB: [birth year]  Age: 63 Years  PCP: Phoebe Sharps (Adults only)  Office Follow Up:  Does the office need to follow up with this patient?: No  Instructions For The Office: N/A  RN Note:  Pt. was scheduled an appt. by the office staff at [facility] at 13:30 today(10/07/14).  Symptoms  Reason For Call & Symptoms: Has a row of bumps from the hairline on the forehead down to the Left eye.The eyelid is starting to swell. Pt. is using a cold compress. Has not felt ill. No fever. No other signs of illness. The bumps itch. Pt. had the Shingles vaccine 2 yrs. ago.  Reviewed Health History In EMR: Yes  Reviewed Medications In EMR: Yes  Reviewed Allergies In EMR: Yes  Reviewed Surgeries / Procedures: Yes  Date of Onset of Symptoms: 10/07/2014  Guideline(s) Used:  Rash or Redness - Localized  Disposition Per Guideline:   See Today in Office  Reason For Disposition Reached:   Painful rash and has multiple small blisters grouped together in one area of body (i.e., dermatomal distribution or "band" or "stripe")  Advice Given:  Call Back If:  You become worse.  Patient Will Follow Care Advice:  YES

## 2014-10-07 NOTE — Progress Notes (Signed)
Pre visit review using our clinic review tool, if applicable. No additional management support is needed unless otherwise documented below in the visit note.   Chief Complaint  Patient presents with  . Left Eyelid Swelling    HPI: Debra Woodward 63 y.o. comes in for an SDA acute appointment today she has a history of allergies but was not having any acute problems fever or upper respiratory infection and then awoke this am with very itchy and tight eye lid l on the left eft and itching and redness Onto forehead left only, no vision change or dc ? Crusty no fever  New meds  She called her eye doctor who fit her in at about 3:00 her eye was dilated retinal exam in IOP done but she left to calm to this visit as a work in. She is to return in 9:45 in the morning to finish the exam. No diagnosis although some question about using antibiotics. Has hx of allergies  Had itchy patch o nneck resolved  In recent past  ROS: See pertinent positives and negatives per HPI. No fever or chills eye pain change in any kind of topical products wonders about a food sensitivity no specific treatment otherwise has never had this before. Felt that she had a bump in the area wonder if something bit her. No systemic symptoms. Had the shingles vaccine no pain and no history of shingles  Past Medical History  Diagnosis Date  . ALLERGIC RHINITIS 09/14/2006  . Fibroid     Family History  Problem Relation Age of Onset  . Stroke Mother   . Diabetes Mother   . Diabetes Father   . Hypertension Sister     History   Social History  . Marital Status: Single    Spouse Name: N/A    Number of Children: N/A  . Years of Education: N/A   Social History Main Topics  . Smoking status: Current Every Day Smoker -- 0.25 packs/day    Types: Cigarettes  . Smokeless tobacco: Not on file  . Alcohol Use: 0.0 oz/week     Comment: Rare  . Drug Use: No  . Sexual Activity: Yes    Birth Control/ Protection: Surgical     Comment:  HYST   Other Topics Concern  . Not on file   Social History Narrative   Going to [employer]     Outpatient Encounter Prescriptions as of 10/07/2014  Medication Sig  . celecoxib (CELEBREX) 200 MG capsule Take 1 capsule (200 mg total) by mouth 2 (two) times daily.  . Cetirizine HCl (ZYRTEC ALLERGY PO) Take by mouth.  Marland Kitchen Cod Liver Oil CAPS Take 1 capsule by mouth daily.  Marland Kitchen docusate sodium (COLACE) 100 MG capsule Take 100 mg by mouth 3 (three) times a week.   . fexofenadine (ALLEGRA) 180 MG tablet Take 180 mg by mouth daily.  . Multiple Vitamin (MULTIVITAMIN) tablet Take 1 tablet by mouth daily.    . Olopatadine HCl (PATADAY) 0.2 % SOLN Apply 1 drop to eye daily.  . carboxymethylcellulose 1 % ophthalmic solution Apply 1 drop to eye daily.  Marland Kitchen desonide (DESOWEN) 0.05 % cream Apply topically 2 (two) times daily. To itchy area   On face not  In eye  . loteprednol (LOTEMAX) 0.2 % SUSP Place 1 drop into both eyes daily.  . valACYclovir (VALTREX) 1000 MG tablet Take 1 tablet (1,000 mg total) by mouth 3 (three) times daily.    EXAM:  BP 144/88 mmHg  Temp(Src)  98 F (36.7 C) (Oral)  Wt 192 lb 14.4 oz (87.499 kg)  Body mass index is 33.09 kg/(m^2).  GENERAL: vitals reviewed and listed above, alert, oriented, appears well hydrated and in no acute distress she has some obvious redness and swelling somewhat translucent upper left lid that radiates up into the forehead almost to the scalp line in a triangular fashion or linear fashion there are no pustules or vesicles but there is 1-2 tiny bumps on the forehead conjunctiva are not inflamed bulbar EOMs appear full HEENT: atraumatic, conjunctiva  clear, no obvious abnormalities on inspection of external nose and ears OP : no lesion edema or exudate  NECK: no obvious masses on inspection palpation  LPSYCH: pleasant and cooperative, no obvious depression or anxiety Skin no other acute rashes or hives.  ASSESSMENT AND PLAN:  Discussed the following  assessment and plan:  Swelling of left eyelid  Rash of face itchy - Left-sided associated with upper eyelid swelling and itchiness. Uncertain cause of the above but it sounds mostly allergic be on the outlook for early shingles although I would've expected to have more pain although she did get the immunization. She had a partial eye exam and will return tomorrow for the rest. It doesn't seem like eye infection to me without any tenderness or cellulitis or systemic symptoms. No URI. Her bulbar conjunctiva seems clear however agree with follow-up with eye doctor  Doesn't like to take prednisone because of side effects and I agree at this point with unknown diagnoses wouldn't use cool compresses antihistamine topical steroid but not around the eye a prescription for Valtrex was given in case it acts more like shingles tomorrow she will take a picture and follow if getting worse other intervention. -Patient advised to return or notify health care team  if symptoms worsen ,persist or new concerns arise.  Patient Instructions  Concern that this could  Be   Allergic  reaction left eye area  Contact  .   unkown cause  For  the Reaction.  Cool compresses.   Can use mild steroid  Not on the eye to see if this also helps  Take benadryl and or zyrtec .  For the itching   It is possible it could be  Early shingles   But usually this hurts more  But if begins in blisters and pain then can add  Valtrex to take .  Fu contact if  Increasing and or fever other concerns        [name]. Panosh M.D.

## 2014-10-07 NOTE — Patient Instructions (Addendum)
Concern that this could  Be   Allergic  reaction left eye area  Contact  .   unkown cause  For  the Reaction.  Cool compresses.   Can use mild steroid  Not on the eye to see if this also helps  Take benadryl and or zyrtec .  For the itching   It is possible it could be  Early shingles   But usually this hurts more  But if begins in blisters and pain then can add  Valtrex to take .  Fu contact if  Increasing and or fever other concerns

## 2014-10-07 NOTE — Telephone Encounter (Signed)
Patient scheduled with Dr Regis Bill today at 3:45pm.

## 2015-01-27 ENCOUNTER — Telehealth: Payer: Self-pay | Admitting: Internal Medicine

## 2015-01-27 NOTE — Telephone Encounter (Signed)
Pt was previous swords pt, saw dr Regis Bill in 11/15 for an acute issue one time.  Pt claims dr Regis Bill is now her pcp. Do you recall accepting this pt? If not , will you accept her?

## 2015-01-28 ENCOUNTER — Encounter: Payer: Self-pay | Admitting: Family Medicine

## 2015-01-28 ENCOUNTER — Ambulatory Visit: Payer: Self-pay | Admitting: Family Medicine

## 2015-01-28 ENCOUNTER — Ambulatory Visit (INDEPENDENT_AMBULATORY_CARE_PROVIDER_SITE_OTHER): Payer: 59 | Admitting: Family Medicine

## 2015-01-28 VITALS — BP 132/80 | HR 77 | Temp 98.6°F | Wt 186.0 lb

## 2015-01-28 DIAGNOSIS — R42 Dizziness and giddiness: Secondary | ICD-10-CM

## 2015-01-28 MED ORDER — MECLIZINE HCL 12.5 MG PO TABS: 12.5000 mg | ORAL_TABLET | Freq: Three times a day (TID) | ORAL | Status: DC | PRN

## 2015-01-28 NOTE — Progress Notes (Signed)
Pre visit review using our clinic review tool, if applicable. No additional management support is needed unless otherwise documented below in the visit note. 

## 2015-01-28 NOTE — Patient Instructions (Signed)
Vertigo Vertigo means you feel like you or your surroundings are moving when they are not. Vertigo can be dangerous if it occurs when you are at work, driving, or performing difficult activities.  CAUSES  Vertigo occurs when there is a conflict of signals sent to your brain from the visual and sensory systems in your body. There are many different causes of vertigo, including:  Infections, especially in the inner ear.  A bad reaction to a drug or misuse of alcohol and medicines.  Withdrawal from drugs or alcohol.  Rapidly changing positions, such as lying down or rolling over in bed.  A migraine headache.  Decreased blood flow to the brain.  Increased pressure in the brain from a head injury, infection, tumor, or bleeding. SYMPTOMS  You may feel as though the world is spinning around or you are falling to the ground. Because your balance is upset, vertigo can cause nausea and vomiting. You may have involuntary eye movements (nystagmus). DIAGNOSIS  Vertigo is usually diagnosed by physical exam. If the cause of your vertigo is unknown, your caregiver may perform imaging tests, such as an MRI scan (magnetic resonance imaging). TREATMENT  Most cases of vertigo resolve on their own, without treatment. Depending on the cause, your caregiver may prescribe certain medicines. If your vertigo is related to body position issues, your caregiver may recommend movements or procedures to correct the problem. In rare cases, if your vertigo is caused by certain inner ear problems, you may need surgery. HOME CARE INSTRUCTIONS   Follow your caregiver's instructions.  Avoid driving.  Avoid operating heavy machinery.  Avoid performing any tasks that would be dangerous to you or others during a vertigo episode.  Tell your caregiver if you notice that certain medicines seem to be causing your vertigo. Some of the medicines used to treat vertigo episodes can actually make them worse in some people. SEEK  IMMEDIATE MEDICAL CARE IF:   Your medicines do not relieve your vertigo or are making it worse.  You develop problems with talking, walking, weakness, or using your arms, hands, or legs.  You develop severe headaches.  Your nausea or vomiting continues or gets worse.  You develop visual changes.  A family member notices behavioral changes.  Your condition gets worse. MAKE SURE YOU:  Understand these instructions.  Will watch your condition.  Will get help right away if you are not doing well or get worse. Document Released: 08/10/2005 Document Revised: 01/23/2012 Document Reviewed: 05/19/2011 [facility] Patient Information 2015 [place], [place]. This information is not intended to replace advice given to you by your health care provider. Make sure you discuss any questions you have with your health care provider.  Let me know if willing to consider vestibular rehab for further treatment.

## 2015-01-28 NOTE — Progress Notes (Signed)
   Subjective:    Patient ID: Debra Woodward, female    DOB: 07-Feb-1951, 64 y.o.   MRN: [id number]  HPI Acute visit. Patient seen with vertigo about 8 days duration. She's had similar episodes in the past. She has some frequent nasal congestion and actually has appointment to see ENT later today. She describes vertigo which comes and goes. She is somewhat improved compared onset. She does not have any ringing in the ears. Possibly some chronic bilateral hearing loss. She has occasional mild nausea. No vomiting. No severe headache. No fevers or chills. Denies any swallowing difficulties or speech changes. No focal weakness. No ataxia. Previously has taken meclizine with mild improvement. Symptoms are triggered by head movement. She does not currently take any regular prescription medications  Past Medical History  Diagnosis Date  . ALLERGIC RHINITIS 09/14/2006  . Fibroid    Past Surgical History  Procedure Laterality Date  . Abdominal hysterectomy    . Knee arthroscopy    . Carpal tunnel release    . Toe surgery    . Breast surgery      Biopsy    reports that she has been smoking Cigarettes.  She has been smoking about 0.25 packs per day. She does not have any smokeless tobacco history on file. She reports that she drinks alcohol. She reports that she does not use illicit drugs. family history includes Diabetes in her father and mother; Hypertension in her sister; Stroke in her mother. Allergies  Allergen Reactions  . Prednisone Other (See Comments)    hallucinations      Review of Systems  Constitutional: Negative for fever, chills, appetite change and unexpected weight change.  Respiratory: Negative for shortness of breath.   Cardiovascular: Negative for chest pain.  Neurological: Positive for dizziness. Negative for seizures, syncope, weakness and headaches.  Hematological: Negative for adenopathy.  Psychiatric/Behavioral: Negative for confusion.       Objective:   Physical  Exam  Constitutional: She is oriented to person, place, and time. She appears well-developed and well-nourished.  Eyes: Pupils are equal, round, and reactive to light.  Neck: Neck supple.  Cardiovascular: Normal rate and regular rhythm.  Exam reveals no gallop.   Pulmonary/Chest: Effort normal and breath sounds normal. No respiratory distress. She has no wheezes. She has no rales.  Neurological: She is alert and oriented to person, place, and time. No cranial nerve deficit.  No focal weakness. Patient has vertigo which is reproduced with head turned to the right and lying back and sitting forward. Symptoms were not produced with head to the left side Normal finger to nose testing. No ataxia.  Psychiatric: She has a normal mood and affect. Her behavior is normal. Judgment and thought content normal.          Assessment & Plan:  Vertigo. Suspect benign positional vertigo. Nonfocal exam. Symptoms triggered by turning head to the right. We explained vestibular rehabilitation is most effective treatment. She declines at this time secondary to cost issues. She will try low-dose meclizine 12.5 mg every 8 hours as needed. Avoid driving is much as possible.

## 2015-02-02 NOTE — Telephone Encounter (Signed)
Dont remember  What I said .guess its possible    So if she said i accepted her  Ok to do this  Time .

## 2015-02-05 NOTE — Telephone Encounter (Signed)
Pt has been sch to come in as new pt

## 2015-02-23 ENCOUNTER — Ambulatory Visit (INDEPENDENT_AMBULATORY_CARE_PROVIDER_SITE_OTHER): Payer: 59 | Admitting: Adult Health

## 2015-02-23 ENCOUNTER — Encounter: Payer: Self-pay | Admitting: Adult Health

## 2015-02-23 VITALS — BP 120/84 | Temp 98.2°F | Wt 187.4 lb

## 2015-02-23 DIAGNOSIS — K090 Developmental odontogenic cysts: Secondary | ICD-10-CM

## 2015-02-23 NOTE — Progress Notes (Signed)
Pre visit review using our clinic review tool, if applicable. No additional management support is needed unless otherwise documented below in the visit note. 

## 2015-02-23 NOTE — Patient Instructions (Addendum)
Your Pilar cysts on the back of your neck appears to have erupted. The matter that came out does not appear to be infected, nor does the skin on the outside of your eruption. I would advise you to keep your appointment with dermatology in case that want to open it up more. In the mean time, if you notice any odorous drainage, warmth or redness around the site. Please follow-up as this is a sign of infection.

## 2015-02-23 NOTE — Progress Notes (Signed)
   Subjective:    Patient ID: Debra Woodward, female    DOB: Jun 21, 1951, 64 y.o.   MRN: [id number]  HPI  Debra Woodward presents to the office today to have her pilar cyst on the back of her neck inspected. On Thursday while at the hairdresser the cyst erupted with about a tablespoon of fibrous material ( which she saved in a plastic bag). She would like to know if she should keep her appointment with dermatology on Thursday. Complains of no pain or signs of infection.   Review of Systems  All other systems reviewed and are negative.      Objective:   Physical Exam  Constitutional: She appears well-developed and well-nourished.  Skin: Skin is warm and dry. No rash noted. No erythema.  Psychiatric: She has a normal mood and affect. Her behavior is normal.  Vitals reviewed.      Assessment & Plan:  Pilar Cyst - Keep appointment with dermatology on Thursday.  - Monitor for signs and symptoms of infection - Follow up in office if she notices any signs or symptoms of infection or if she has any other concerns.

## 2015-02-26 ENCOUNTER — Other Ambulatory Visit: Payer: Self-pay

## 2015-02-26 ENCOUNTER — Other Ambulatory Visit: Payer: Self-pay | Admitting: Dermatology

## 2015-02-26 DIAGNOSIS — Z1231 Encounter for screening mammogram for malignant neoplasm of breast: Secondary | ICD-10-CM

## 2015-03-10 ENCOUNTER — Encounter: Payer: Self-pay | Admitting: Women's Health

## 2015-03-10 ENCOUNTER — Ambulatory Visit (INDEPENDENT_AMBULATORY_CARE_PROVIDER_SITE_OTHER): Payer: 59 | Admitting: Women's Health

## 2015-03-10 VITALS — BP 140/84 | Ht 64.0 in | Wt 187.0 lb

## 2015-03-10 DIAGNOSIS — Z01419 Encounter for gynecological examination (general) (routine) without abnormal findings: Secondary | ICD-10-CM | POA: Diagnosis not present

## 2015-03-10 DIAGNOSIS — Z13820 Encounter for screening for osteoporosis: Secondary | ICD-10-CM | POA: Diagnosis not present

## 2015-03-10 DIAGNOSIS — Z13220 Encounter for screening for lipoid disorders: Secondary | ICD-10-CM | POA: Diagnosis not present

## 2015-03-10 LAB — COMPREHENSIVE METABOLIC PANEL
ALBUMIN: 4 g/dL (ref 3.5–5.2)
ALK PHOS: 79 U/L (ref 39–117)
ALT: 15 U/L (ref 0–35)
AST: 18 U/L (ref 0–37)
BILIRUBIN TOTAL: 0.3 mg/dL (ref 0.2–1.2)
BUN: 16 mg/dL (ref 6–23)
CALCIUM: 9.9 mg/dL (ref 8.4–10.5)
CO2: 28 meq/L (ref 19–32)
CREATININE: 0.88 mg/dL (ref 0.50–1.10)
Chloride: 107 mEq/L (ref 96–112)
Glucose, Bld: 94 mg/dL (ref 70–99)
POTASSIUM: 4.3 meq/L (ref 3.5–5.3)
SODIUM: 143 meq/L (ref 135–145)
TOTAL PROTEIN: 6.8 g/dL (ref 6.0–8.3)

## 2015-03-10 LAB — LIPID PANEL
CHOLESTEROL: 145 mg/dL (ref 0–200)
HDL: 72 mg/dL (ref >=46)
LDL Cholesterol: 53 mg/dL (ref 0–99)
TRIGLYCERIDES: 101 mg/dL (ref <150)
Total CHOL/HDL Ratio: 2 Ratio
VLDL: 20 mg/dL (ref 0–40)

## 2015-03-10 LAB — CBC WITH DIFFERENTIAL/PLATELET
BASOS ABS: 0 10*3/uL (ref 0.0–0.1)
Basophils Relative: 0 % (ref 0–1)
EOS PCT: 3 % (ref 0–5)
Eosinophils Absolute: 0.2 10*3/uL (ref 0.0–0.7)
HEMATOCRIT: 40.9 % (ref 36.0–46.0)
Hemoglobin: 13.8 g/dL (ref 12.0–15.0)
LYMPHS PCT: 31 % (ref 12–46)
Lymphs Abs: 2.1 10*3/uL (ref 0.7–4.0)
MCH: 30.8 pg (ref 26.0–34.0)
MCHC: 33.7 g/dL (ref 30.0–36.0)
MCV: 91.3 fL (ref 78.0–100.0)
MONO ABS: 0.5 10*3/uL (ref 0.1–1.0)
MPV: 9.7 fL (ref 8.6–12.4)
Monocytes Relative: 7 % (ref 3–12)
NEUTROS ABS: 4.1 10*3/uL (ref 1.7–7.7)
Neutrophils Relative %: 59 % (ref 43–77)
Platelets: 419 10*3/uL — ABNORMAL HIGH (ref 150–400)
RBC: 4.48 MIL/uL (ref 3.87–5.11)
RDW: 14 % (ref 11.5–15.5)
WBC: 6.9 10*3/uL (ref 4.0–10.5)

## 2015-03-10 NOTE — Patient Instructions (Signed)

## 2015-03-10 NOTE — Progress Notes (Signed)
Jermiya Reichl [birth year] [id number]    History:    Presents for annual exam.  1996 TAH for fibroids on no HRT. Normal Pap and mammogram history. 2010 T score -0.2 at hip. Had quit smoking 2011 but has gained weight, now smoking several cigarettes daily. 2006 negative colonoscopy. Had Zostavax and Pneumovax at primary care. Not sexually active.  Past medical history, past surgical history, family history and social history were all reviewed and documented in the EPIC chart. Substitute elementary school teacher. 2 children both doing well. Parents diabetes both deceased.  ROS:  A ROS was performed and pertinent positives and negatives are included.  Exam:  Filed Vitals:   03/10/15 1528  BP: 140/84    General appearance:  Normal Thyroid:  Symmetrical, normal in size, without palpable masses or nodularity. Respiratory  Auscultation:  Clear without wheezing or rhonchi Cardiovascular  Auscultation:  Regular rate, without rubs, murmurs or gallops  Edema/varicosities:  Not grossly evident Abdominal  Soft,nontender, without masses, guarding or rebound.  Liver/spleen:  No organomegaly noted  Hernia:  None appreciated  Skin  Inspection:  Grossly normal   Breasts: Examined lying and sitting.     Right: Without masses, retractions, discharge or axillary adenopathy.     Left: Without masses, retractions, discharge or axillary adenopathy. Gentitourinary   Inguinal/mons:  Normal without inguinal adenopathy  External genitalia:  Normal  BUS/Urethra/Skene's glands:  Normal  Vagina:  Normal  Cervix:  Absent  Uterus:  Absent  Adnexa/parametria:     Rt: Without masses or tenderness.   Lt: Without masses or tenderness.  Anus and perineum: Normal  Digital rectal exam: Normal sphincter tone without palpated masses or tenderness  Assessment/Plan:  64 y.o. SBF G3P2 for annual exam with no complaints other than bad news of needing to replace heating air conditioning system at her home, which she  attributes to elevated blood pressure.  TAH for fibroids no HRT Smoker Borderline blood pressure today  Plan: Reviewed at pressure elevated 140/862. Reviewed importance of follow-up if continues greater than 130/80. Reviewed hazards of hypertension, smoking, aware, mother had a stroke. SBE's, continue annual screening mammogram has scheduled. Increase regular exercise, decrease calories for weight loss, calcium rich foods, vitamin D 2000 daily encouraged. Repeat DEXA, instructed to schedule home safety, fall prevention discussed. Repeat colonoscopy this year, instructed to schedule had first one at [place]. CBC, lipid panel, CMP, vitamin [facility], 4:37 PM 03/10/2015

## 2015-03-11 LAB — URINALYSIS W MICROSCOPIC + REFLEX CULTURE
BILIRUBIN URINE: NEGATIVE
Bacteria, UA: NONE SEEN
CRYSTALS: NONE SEEN
Casts: NONE SEEN
GLUCOSE, UA: NEGATIVE mg/dL
Hgb urine dipstick: NEGATIVE
KETONES UR: NEGATIVE mg/dL
Leukocytes, UA: NEGATIVE
Nitrite: NEGATIVE
Protein, ur: NEGATIVE mg/dL
SPECIFIC GRAVITY, URINE: 1.026 (ref 1.005–1.030)
Squamous Epithelial / LPF: NONE SEEN
UROBILINOGEN UA: 0.2 mg/dL (ref 0.0–1.0)
pH: 5.5 (ref 5.0–8.0)

## 2015-03-11 LAB — VITAMIN D 25 HYDROXY (VIT D DEFICIENCY, FRACTURES): Vit D, 25-Hydroxy: 23 ng/mL — ABNORMAL LOW (ref 30–100)

## 2015-03-23 ENCOUNTER — Other Ambulatory Visit: Payer: Self-pay | Admitting: Women's Health

## 2015-03-23 ENCOUNTER — Other Ambulatory Visit: Payer: Self-pay | Admitting: Gynecology

## 2015-03-23 MED ORDER — VITAMIN D (ERGOCALCIFEROL) 1.25 MG (50000 UNIT) PO CAPS: 50000.0000 [IU] | ORAL_CAPSULE | ORAL | Status: DC

## 2015-04-06 ENCOUNTER — Ambulatory Visit
Admission: RE | Admit: 2015-04-06 | Discharge: 2015-04-06 | Disposition: A | Discharge status: Home / Self Care | Payer: 59 | Source: Ambulatory Visit

## 2015-04-06 DIAGNOSIS — Z1231 Encounter for screening mammogram for malignant neoplasm of breast: Secondary | ICD-10-CM

## 2015-05-11 ENCOUNTER — Telehealth: Payer: Self-pay | Admitting: Internal Medicine

## 2015-05-11 NOTE — Telephone Encounter (Signed)
Home Care Advise given by [facility].

## 2015-05-11 NOTE — Telephone Encounter (Signed)
Pt is a transfer from Dr. Leanne Chang.

## 2015-05-11 NOTE — Telephone Encounter (Signed)
Patient Name: Debra Woodward  DOB: [birth year]    Initial Comment Caller states she got attacked by yellow jackets late Saturday. She is still having some swelling, heat, and swelling.    Nurse Assessment  Nurse: Raphael Gibney, RN, Vanita Ingles Date/Time (Eastern Time): 05/11/2015 9:16:02 AM  Confirm and document reason for call. If symptomatic, describe symptoms. ---Caller states she was attacked by yellow jackets on Saturday on her ankle, at the crease of her buttocks; calf; back of hand and underneath finger. She got the stingers out. She has used calamine and hydrocortisone cream. She is using cold compresses. Areas are swollen. Areas feel warm where she got stung.  Has the patient traveled out of the country within the last 30 days? ---Not Applicable  Does the patient require triage? ---Yes  Related visit to physician within the last 2 weeks? ---No  Does the PT have any chronic conditions? (i.e. diabetes, asthma, etc.) ---No     Guidelines    Guideline Title Affirmed Question Affirmed Notes  Bee or Yellow Jacket Sting Normal local reaction to bee, wasp, or yellow jacket sting (all triage questions negative)    Final Disposition User   [place], RN, Vanita Ingles

## 2015-05-20 ENCOUNTER — Ambulatory Visit (INDEPENDENT_AMBULATORY_CARE_PROVIDER_SITE_OTHER): Payer: 59 | Admitting: Internal Medicine

## 2015-05-20 ENCOUNTER — Encounter: Payer: Self-pay | Admitting: Internal Medicine

## 2015-05-20 VITALS — BP 138/84 | Temp 98.9°F | Ht 63.75 in | Wt 187.5 lb

## 2015-05-20 DIAGNOSIS — R635 Abnormal weight gain: Secondary | ICD-10-CM | POA: Diagnosis not present

## 2015-05-20 DIAGNOSIS — J309 Allergic rhinitis, unspecified: Secondary | ICD-10-CM

## 2015-05-20 DIAGNOSIS — Z1211 Encounter for screening for malignant neoplasm of colon: Secondary | ICD-10-CM

## 2015-05-20 DIAGNOSIS — Z720 Tobacco use: Secondary | ICD-10-CM

## 2015-05-20 DIAGNOSIS — F17200 Nicotine dependence, unspecified, uncomplicated: Secondary | ICD-10-CM

## 2015-05-20 DIAGNOSIS — T148 Other injury of unspecified body region: Secondary | ICD-10-CM

## 2015-05-20 DIAGNOSIS — W57XXXA Bitten or stung by nonvenomous insect and other nonvenomous arthropods, initial encounter: Secondary | ICD-10-CM

## 2015-05-20 NOTE — Patient Instructions (Addendum)
We'll do a colonoscopy referral as discussed. In use cool compresses or Benadryl for the [place]queda bites. Contact us if needed for referrals for I and feet. Tracking intake and activity is the best way to control weight. Her laboratory tests from April were fine Plan check up CPX next May or thereabouts         Why follow it? Research shows. . Those who follow the Mediterranean diet have a reduced risk of heart disease  . The diet is associated with a reduced incidence of Parkinson's and Alzheimer's diseases . People following the diet may have longer life expectancies and lower rates of chronic diseases  . The Dietary Guidelines for Americans recommends the Mediterranean diet as an eating plan to promote health and prevent disease  What Is the Mediterranean Diet?  . Healthy eating plan based on typical foods and recipes of Mediterranean-style cooking . The diet is primarily a plant based diet; these foods should make up a majority of meals   Starches - Plant based foods should make up a majority of meals - They are an important sources of vitamins, minerals, energy, antioxidants, and fiber - Choose whole grains, foods high in fiber and minimally processed items  - Typical grain sources include wheat, oats, barley, corn, brown rice, bulgar, farro, millet, polenta, couscous  - Various types of beans include chickpeas, lentils, fava beans, black beans, white beans   Fruits  Veggies - Large quantities of antioxidant rich fruits & veggies; 6 or more servings  - Vegetables can be eaten raw or lightly drizzled with oil and cooked  - Vegetables common to the traditional Mediterranean Diet include: artichokes, arugula, beets, broccoli, brussel sprouts, cabbage, carrots, celery, collard greens, cucumbers, eggplant, kale, leeks, lemons, lettuce, mushrooms, okra, onions, peas, peppers, potatoes, pumpkin, radishes, rutabaga, shallots, spinach, sweet potatoes, turnips, zucchini - Fruits common to the  Mediterranean Diet include: apples, apricots, avocados, cherries, clementines, dates, figs, grapefruits, grapes, melons, nectarines, oranges, peaches, pears, pomegranates, strawberries, tangerines  Fats - Replace butter and margarine with healthy oils, such as olive oil, canola oil, and tahini  - Limit nuts to no more than a handful a day  - Nuts include walnuts, almonds, pecans, pistachios, pine nuts  - Limit or avoid candied, honey roasted or heavily salted nuts - Olives are central to the [employer] - can be eaten whole or used in a variety of dishes   Meats Protein - Limiting red meat: no more than a few times a month - When eating red meat: choose lean cuts and keep the portion to the size of deck of cards - Eggs: approx. 0 to 4 times a week  - Fish and lean poultry: at least 2 a week  - Healthy protein sources include, chicken, [place], lean beef, lamb - Increase intake of seafood such as tuna, salmon, trout, mackerel, shrimp, scallops - Avoid or limit high fat processed meats such as sausage and bacon  Dairy - Include moderate amounts of low fat dairy products  - Focus on healthy dairy such as fat free yogurt, skim milk, low or reduced fat cheese - Limit dairy products higher in fat such as whole or 2% milk, cheese, ice cream  Alcohol - Moderate amounts of red wine is ok  - No more than 5 oz daily for women (all ages) and men older than age 69  - No more than 10 oz of wine daily for men younger than 32  Other - Limit sweets and other  desserts  - Use herbs and spices instead of salt to flavor foods  - Herbs and spices common to the traditional Mediterranean Diet include: basil, bay leaves, chives, cloves, cumin, fennel, garlic, lavender, marjoram, mint, oregano, parsley, pepper, rosemary, sage, savory, sumac, tarragon, thyme   It's not just a diet, it's a lifestyle:  . The Mediterranean diet includes lifestyle factors typical of those in the region  . Foods, drinks and meals are  best eaten with others and savored . Daily physical activity is important for overall good health . This could be strenuous exercise like running and aerobics . This could also be more leisurely activities such as walking, housework, yard-work, or taking the stairs . Moderation is the key; a balanced and healthy diet accommodates most foods and drinks . Consider portion sizes and frequency of consumption of certain foods   Meal Ideas & Options:  . Breakfast:  o Whole wheat toast or whole wheat English muffins with peanut butter & hard boiled egg o Steel cut oats topped with apples & cinnamon and skim milk  o Fresh fruit: banana, strawberries, melon, berries, peaches  o Smoothies: strawberries, bananas, greek yogurt, peanut butter o Low fat greek yogurt with blueberries and granola  o Egg white omelet with spinach and mushrooms o Breakfast couscous: whole wheat couscous, apricots, skim milk, cranberries  . Sandwiches:  o Hummus and grilled vegetables (peppers, zucchini, squash) on whole wheat bread   o Grilled chicken on whole wheat pita with lettuce, tomatoes, cucumbers or tzatziki  o Tuna salad on whole wheat bread: tuna salad made with greek yogurt, olives, red peppers, capers, green onions o Garlic rosemary lamb pita: lamb sauted with garlic, rosemary, salt & pepper; add lettuce, cucumber, greek yogurt to pita - flavor with lemon juice and black pepper  . Seafood:  o Mediterranean grilled salmon, seasoned with garlic, basil, parsley, lemon juice and black pepper o Shrimp, lemon, and spinach whole-grain pasta salad made with low fat greek yogurt  o Seared scallops with lemon orzo  o Seared tuna steaks seasoned salt, pepper, coriander topped with tomato mixture of olives, tomatoes, olive oil, minced garlic, parsley, green onions and cappers  . Meats:  o Herbed greek chicken salad with kalamata olives, cucumber, feta  o Red bell peppers stuffed with spinach, bulgur, lean ground beef (or  lentils) & topped with feta   o Kebabs: skewers of chicken, tomatoes, onions, zucchini, squash  o [place] burgers: made with red onions, mint, dill, lemon juice, feta cheese topped with roasted red peppers . Vegetarian o Cucumber salad: cucumbers, artichoke hearts, celery, red onion, feta cheese, tossed in olive oil & lemon juice  o Hummus and whole grain pita points with a greek salad (lettuce, tomato, feta, olives, cucumbers, red onion) o Lentil soup with celery, carrots made with vegetable broth, garlic, salt and pepper  o Tabouli salad: parsley, bulgur, mint, scallions, cucumbers, tomato, radishes, lemon juice, olive oil, salt and pepper.

## 2015-05-20 NOTE — Progress Notes (Signed)
Pre visit review using our clinic review tool, if applicable. No additional management support is needed unless otherwise documented below in the visit note.  Chief Complaint  Patient presents with  . Establish Care    HPI: Patient  Debra Woodward  64 y.o. comes in today to further establish  Transfer dr Dennie Bible  To have a bone denisty on july14th  Had pap  And due for colonscopy   Concern about weight gain  Had labs in April  What to do   Stress  In    Broth erl in law ung transplant   icu at [facility] .  Tobacco still trying . Has toenail fungus decreasing  And may need referral to podiatry in future as well as eye check .  allergies no asthma. Had multiple bee stings jun 24 and had at least 5 stingers removed no fever resp sx  But feels tired now. No cp sob  Now has itchy bites? On right forearm . No pain   Health Maintenance  Topic Date Due  . PAP SMEAR  08/14/2014  . COLONOSCOPY  03/31/2015  . INFLUENZA VACCINE  06/15/2015  . MAMMOGRAM  04/05/2017  . TETANUS/TDAP  03/14/2019  . ZOSTAVAX  Completed  . HIV Screening  Completed   Health Maintenance Review LIFESTYLE:  Exercise:   Tobacco/ETS: still working  1pp 8 days . Alcohol:  One in a blue moon Sugar beverages: not reg  Sleep: 4 hours   caretaking   . Drug use: no Bone density: getting this  Colonoscopy:  Due  PAP: utd  MAMMO:utd    ROS:  GEN/ HEENT: No fever, significant weight changes sweats headaches vision problems hearing changes, CV/ PULM; No chest pain shortness of breath cough, syncope,edema  change in exercise tolerance. GI /GU: No adominal pain, vomiting, change in bowel habits. No blood in the stool. No significant GU symptoms. SKIN/HEME: ,no acute skin rashes suspicious lesions or bleeding. No lymphadenopathy, nodules, masses.  NEURO/ PSYCH:  No neurologic signs such as weakness numbness. No depression anxiety. IMM/ Allergy: No unusual infections.  Allergy .   REST of 12 system review negative  except as per HPI   Past Medical History  Diagnosis Date  . ALLERGIC RHINITIS 09/14/2006  . Fibroid     Past Surgical History  Procedure Laterality Date  . Abdominal hysterectomy    . Knee arthroscopy    . Carpal tunnel release    . Toe surgery    . Breast surgery      Biopsy  . Cystectomy      Family History  Problem Relation Age of Onset  . Stroke Mother   . Diabetes Mother   . Diabetes Father   . Hypertension Sister     History   Social History  . Marital Status: Single    Spouse Name: N/A  . Number of Children: N/A  . Years of Education: N/A   Social History Main Topics  . Smoking status: Current Every Day Smoker -- 0.25 packs/day    Types: Cigarettes  . Smokeless tobacco: Not on file  . Alcohol Use: 0.0 oz/week     Comment: Rare  . Drug Use: No  . Sexual Activity: Yes    Birth Control/ Protection: Surgical     Comment: HYST   Other Topics Concern  . 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     Outpatient Prescriptions Prior to Visit  Medication Sig Dispense Refill  . carboxymethylcellulose 1 % ophthalmic solution Apply 1 drop to eye daily.    . celecoxib (CELEBREX) 200 MG capsule Take 1 capsule (200 mg total) by mouth 2 (two) times daily. (Patient taking differently: Take 200 mg by mouth as needed. ) 30 capsule 0  . Cetirizine HCl (ZYRTEC ALLERGY PO) Take by mouth as needed.     Marland Kitchen Cod Liver Oil CAPS Take 1 capsule by mouth daily.    Marland Kitchen desonide (DESOWEN) 0.05 % cream Apply topically 2 (two) times daily. To itchy area   On face not  In eye 30 g 0  . docusate sodium (COLACE) 100 MG capsule Take 100 mg by mouth 3 (three) times a week.     . fexofenadine (ALLEGRA) 180 MG tablet Take 180 mg by mouth daily.    Marland Kitchen loteprednol (LOTEMAX) 0.2 % SUSP Place 1 drop into both eyes daily.    . meclizine (ANTIVERT) 12.5 MG tablet Take 1 tablet (12.5 mg total) by mouth 3 (three) times  daily as needed for dizziness. 30 tablet 0  . Multiple Vitamin (MULTIVITAMIN) tablet Take 1 tablet by mouth daily.      . Olopatadine HCl (PATADAY) 0.2 % SOLN Apply 1 drop to eye daily.    . Vitamin D, Ergocalciferol, (DRISDOL) 50000 UNITS CAPS capsule Take 1 capsule (50,000 Units total) by mouth every 7 (seven) days. 12 capsule 0   No facility-administered medications prior to visit.     EXAM:  BP 138/84 mmHg  Temp(Src) 98.9 F (37.2 C) (Oral)  Ht 5' 3.75" (1.619 m)  Wt 187 lb 8 oz (85.049 kg)  BMI 32.45 kg/m2  Body mass index is 32.45 kg/(m^2).  Physical Exam: Vital signs reviewed IRS:WNIO is a well-developed well-nourished alert cooperative    who appearsr stated age in no acute distress.  HEENT: normocephalic atraumatic , Eyes: PERRL EOM's full, conjunctiva clear, Nares: paten,t no deformity discharge or tenderness., Ears: no deformity EAC's clear TMs with normal landmarks. Mouth: clear OP, no lesions, edema.  Moist mucous membranes.  NECK: supple without masses, thyromegaly or bruits. CHEST/PULM:  Clear to auscultation and percussion breath sounds equal no wheeze , rales or rhonchi. No chest wall deformities or tenderness. CV: PMI is nondisplaced, S1 S2 no gallops, murmurs, rubs. Peripheral pulses are full without delay.No JVD .  ABDOMEN: Bowel sounds normal nontender  No guard or rebound, no hepato splenomegal no CVA tenderness.   Extremtities:  No clubbing cyanosis or edema, no acute joint swelling or redness no focal atrophy NEURO:  Oriented x3, cranial nerves 3-12 appear to be intact, no obvious focal weakness,gait within normal limits no abnormal reflexes or asymmetrical SKIN: normal turgor, color, no bruising or petechiae. Right arm with large whelpy bug bites? In a row forearm  No v esicles .  Faded area of bee stings leg and hands  Toenails thickened  No weeping min redness  PSYCH: Oriented, good eye contact, no obvious depression anxiety, cognition and judgment appear  normal. LN: no cervical adenopathy  Lab Results  Component Value Date   WBC 6.9 03/10/2015   HGB 13.8 03/10/2015   HCT 40.9 03/10/2015   PLT 419* 03/10/2015   GLUCOSE 94 03/10/2015   CHOL 145 03/10/2015   TRIG 101 03/10/2015   HDL 72 03/10/2015   LDLCALC 53 03/10/2015   ALT 15 03/10/2015   AST 18  03/10/2015   NA 143 03/10/2015   K 4.3 03/10/2015   CL 107 03/10/2015   CREATININE 0.88 03/10/2015   BUN 16 03/10/2015   CO2 28 03/10/2015   TSH 1.68 01/29/2014   INR 0.94 09/07/2010   HGBA1C 6.4* 01/31/2007   BP Readings from Last 3 Encounters:  05/20/15 138/84  03/10/15 140/84  02/23/15 120/84   Wt Readings from Last 3 Encounters:  05/20/15 187 lb 8 oz (85.049 kg)  03/10/15 187 lb (84.823 kg)  02/23/15 187 lb 6.4 oz (85.004 kg)    ASSESSMENT AND PLAN:  Discussed the following assessment and plan:  Weight gain  Insect bite - bee stings better  arm look like mosquito bites   Colon cancer screening - refer - Plan: Ambulatory referral to Gastroenterology  Tobacco use disorder - counseled and encourgaged  warning about weight gain potential   Allergic rhinitis, unspecified allergic rhinitis type - stable   Patient Care Team: Burnis Medin, MD as PCP - General (Internal Medicine) Huel Cote, NP as Nurse Practitioner (Obstetrics and Gynecology) Patient Instructions    We'll do a colonoscopy referral as discussed. In use cool compresses or Benadryl for the Esqueda bites. Contact us if needed for referrals for I and feet. Tracking intake and activity is the best way to control weight. Her laboratory tests from April were fine Plan check up CPX next May or thereabouts         Why follow it? Research shows. . Those who follow the Mediterranean diet have a reduced risk of heart disease  . The diet is associated with a reduced incidence of Parkinson's and Alzheimer's diseases . People following the diet may have longer life expectancies and lower rates of  chronic diseases  . The Dietary Guidelines for Americans recommends the Mediterranean diet as an eating plan to promote health and prevent disease  What Is the Mediterranean Diet?  . Healthy eating plan based on typical foods and recipes of Mediterranean-style cooking . The diet is primarily a plant based diet; these foods should make up a majority of meals   Starches - Plant based foods should make up a majority of meals - They are an important sources of vitamins, minerals, energy, antioxidants, and fiber - Choose whole grains, foods high in fiber and minimally processed items  - Typical grain sources include wheat, oats, barley, corn, brown rice, bulgar, farro, millet, polenta, couscous  - Various types of beans include chickpeas, lentils, fava beans, black beans, white beans   Fruits  Veggies - Large quantities of antioxidant rich fruits & veggies; 6 or more servings  - Vegetables can be eaten raw or lightly drizzled with oil and cooked  - Vegetables common to the traditional Mediterranean Diet include: artichokes, arugula, beets, broccoli, brussel sprouts, cabbage, carrots, celery, collard greens, cucumbers, eggplant, kale, leeks, lemons, lettuce, mushrooms, okra, onions, peas, peppers, potatoes, pumpkin, radishes, rutabaga, shallots, spinach, sweet potatoes, turnips, zucchini - Fruits common to the Mediterranean Diet include: apples, apricots, avocados, cherries, clementines, dates, figs, grapefruits, grapes, melons, nectarines, oranges, peaches, pears, pomegranates, strawberries, tangerines  Fats - Replace butter and margarine with healthy oils, such as olive oil, canola oil, and tahini  - Limit nuts to no more than a handful a day  - Nuts include walnuts, almonds, pecans, pistachios, pine nuts  - Limit or avoid candied, honey roasted or heavily salted nuts - Olives are central to the Mediterranean diet - can be eaten whole or used in a variety  of dishes   Meats Protein - Limiting red  meat: no more than a few times a month - When eating red meat: choose lean cuts and keep the portion to the size of deck of cards - Eggs: approx. 0 to 4 times a week  - Fish and lean poultry: at least 2 a week  - Healthy protein sources include, chicken, [place], lean beef, lamb - Increase intake of seafood such as tuna, salmon, trout, mackerel, shrimp, scallops - Avoid or limit high fat processed meats such as sausage and bacon  Dairy - Include moderate amounts of low fat dairy products  - Focus on healthy dairy such as fat free yogurt, skim milk, low or reduced fat cheese - Limit dairy products higher in fat such as whole or 2% milk, cheese, ice cream  Alcohol - Moderate amounts of red wine is ok  - No more than 5 oz daily for women (all ages) and men older than age 21  - No more than 10 oz of wine daily for men younger than 53  Other - Limit sweets and other desserts  - Use herbs and spices instead of salt to flavor foods  - Herbs and spices common to the traditional Mediterranean Diet include: basil, bay leaves, chives, cloves, cumin, fennel, garlic, lavender, marjoram, mint, oregano, parsley, pepper, rosemary, sage, savory, sumac, tarragon, thyme   It's not just a diet, it's a lifestyle:  . The Mediterranean diet includes lifestyle factors typical of those in the region  . Foods, drinks and meals are best eaten with others and savored . Daily physical activity is important for overall good health . This could be strenuous exercise like running and aerobics . This could also be more leisurely activities such as walking, housework, yard-work, or taking the stairs . Moderation is the key; a balanced and healthy diet accommodates most foods and drinks . Consider portion sizes and frequency of consumption of certain foods   Meal Ideas & Options:  . Breakfast:  o Whole wheat toast or whole wheat English muffins with peanut butter & hard boiled egg o Steel cut oats topped with apples &  cinnamon and skim milk  o Fresh fruit: banana, strawberries, melon, berries, peaches  o Smoothies: strawberries, bananas, greek yogurt, peanut butter o Low fat greek yogurt with blueberries and granola  o Egg white omelet with spinach and mushrooms o Breakfast couscous: whole wheat couscous, apricots, skim milk, cranberries  . Sandwiches:  o Hummus and grilled vegetables (peppers, zucchini, squash) on whole wheat bread   o Grilled chicken on whole wheat pita with lettuce, tomatoes, cucumbers or tzatziki  o Tuna salad on whole wheat bread: tuna salad made with greek yogurt, olives, red peppers, capers, green onions o Garlic rosemary lamb pita: lamb sauted with garlic, rosemary, salt & pepper; add lettuce, cucumber, greek yogurt to pita - flavor with lemon juice and black pepper  . Seafood:  o Mediterranean grilled salmon, seasoned with garlic, basil, parsley, lemon juice and black pepper o Shrimp, lemon, and spinach whole-grain pasta salad made with low fat greek yogurt  o Seared scallops with lemon orzo  o Seared tuna steaks seasoned salt, pepper, coriander topped with tomato mixture of olives, tomatoes, olive oil, minced garlic, parsley, green onions and cappers  . Meats:  o Herbed greek chicken salad with kalamata olives, cucumber, feta  o Red bell peppers stuffed with spinach, bulgur, lean ground beef (or lentils) & topped with feta   o Kebabs: skewers  of chicken, tomatoes, onions, zucchini, squash  o [place] burgers: made with red onions, mint, dill, lemon juice, feta cheese topped with roasted red peppers . Vegetarian o Cucumber salad: cucumbers, artichoke hearts, celery, red onion, feta cheese, tossed in olive oil & lemon juice  o Hummus and whole grain pita points with a greek salad (lettuce, tomato, feta, olives, cucumbers, red onion) o Lentil soup with celery, carrots made with vegetable broth, garlic, salt and pepper  o Tabouli salad: parsley, bulgur, mint, scallions, cucumbers,  tomato, radishes, lemon juice, olive oil, salt and pepper.         [name]. Aubria Vanecek M.D.

## 2015-05-21 ENCOUNTER — Encounter: Payer: Self-pay | Admitting: Internal Medicine

## 2015-05-25 ENCOUNTER — Encounter: Payer: Self-pay | Admitting: Internal Medicine

## 2015-05-28 ENCOUNTER — Other Ambulatory Visit: Payer: Self-pay | Admitting: Women's Health

## 2015-05-28 ENCOUNTER — Other Ambulatory Visit: Payer: Self-pay | Admitting: Gynecology

## 2015-05-28 ENCOUNTER — Ambulatory Visit (INDEPENDENT_AMBULATORY_CARE_PROVIDER_SITE_OTHER): Payer: 59

## 2015-05-28 DIAGNOSIS — Z13820 Encounter for screening for osteoporosis: Secondary | ICD-10-CM

## 2015-05-28 DIAGNOSIS — Z780 Asymptomatic menopausal state: Secondary | ICD-10-CM

## 2015-07-09 ENCOUNTER — Ambulatory Visit (AMBULATORY_SURGERY_CENTER): Payer: Self-pay | Admitting: *Deleted

## 2015-07-09 VITALS — Ht 65.0 in | Wt 188.4 lb

## 2015-07-09 DIAGNOSIS — Z1211 Encounter for screening for malignant neoplasm of colon: Secondary | ICD-10-CM

## 2015-07-09 NOTE — Progress Notes (Signed)
No allergies to eggs or soy. No problems with anesthesia.  Pt given Emmi instructions for colonoscopy  No oxygen use  No diet drug use  

## 2015-07-23 ENCOUNTER — Encounter: Payer: Self-pay | Admitting: Internal Medicine

## 2015-07-23 ENCOUNTER — Ambulatory Visit (AMBULATORY_SURGERY_CENTER): Payer: 59 | Admitting: Internal Medicine

## 2015-07-23 VITALS — BP 132/83 | HR 70 | Temp 96.6°F | Resp 26 | Ht 65.0 in | Wt 188.0 lb

## 2015-07-23 DIAGNOSIS — Z1211 Encounter for screening for malignant neoplasm of colon: Secondary | ICD-10-CM | POA: Diagnosis present

## 2015-07-23 DIAGNOSIS — D124 Benign neoplasm of descending colon: Secondary | ICD-10-CM | POA: Diagnosis not present

## 2015-07-23 DIAGNOSIS — D122 Benign neoplasm of ascending colon: Secondary | ICD-10-CM

## 2015-07-23 DIAGNOSIS — D123 Benign neoplasm of transverse colon: Secondary | ICD-10-CM

## 2015-07-23 HISTORY — PX: COLONOSCOPY: SHX174

## 2015-07-23 MED ORDER — SODIUM CHLORIDE 0.9 % IV SOLN: 500.0000 mL | INTRAVENOUS | Status: DC

## 2015-07-23 NOTE — Progress Notes (Signed)
Called to room to assist during endoscopic procedure.  Patient ID and intended procedure confirmed with present staff. Received instructions for my participation in the procedure from the performing physician.  

## 2015-07-23 NOTE — Progress Notes (Signed)
  Ault Anesthesia Post-op Note  Patient: Debra Woodward  Procedure(s) Performed: colonoscopy  Patient Location: [facility] - Recovery Area  Anesthesia Type: Deep Sedation/Propofol  Level of Consciousness: awake, oriented and patient cooperative  Airway and Oxygen Therapy: Patient Spontanous Breathing  Post-op Pain: none  Post-op Assessment:  Post-op Vital signs reviewed, Patient's Cardiovascular Status Stable, Respiratory Function Stable, Patent Airway, No signs of Nausea or vomiting and Pain level controlled  Post-op Vital Signs: Reviewed and stable  Complications: No apparent anesthesia complications  Dejan Angert E 11:20 AM

## 2015-07-23 NOTE — Op Note (Signed)
[place]  [address] [place], [address]   COLONOSCOPY PROCEDURE REPORT  PATIENT: Debra Woodward, Debra Woodward  MR#: [id number] BIRTHDATE: 1951-02-02 , 7  yrs. old GENDER: female ENDOSCOPIST: Gatha Mayer, MD, [facility] PROCEDURE DATE:  07/23/2015 PROCEDURE:   Colonoscopy, screening, Colonoscopy with biopsy, and Colonoscopy with snare polypectomy First Screening Colonoscopy - Avg.  risk and is 50 yrs.  old or older - No.  Prior Negative Screening - Now for repeat screening. 10 or more years since last screening  History of Adenoma - Now for follow-up colonoscopy & has been > or = to 3 yrs.  N/A  Polyps removed today? Yes ASA CLASS:   Class II INDICATIONS:Screening for colonic neoplasia and Colorectal Neoplasm Risk Assessment for this procedure is average risk. MEDICATIONS: Propofol 200 mg IV and Monitored anesthesia care  DESCRIPTION OF PROCEDURE:   After the risks benefits and alternatives of the procedure were thoroughly explained, informed consent was obtained.  The digital rectal exam revealed no abnormalities of the rectum.   The LB [id number]  endoscope was introduced through the anus and advanced to the cecum, which was identified by both the appendix and ileocecal valve. No adverse events experienced.   The quality of the prep was excellent. (MiraLax was used)  The instrument was then slowly withdrawn as the colon was fully examined. Estimated blood loss is zero unless otherwise noted in this procedure report.      COLON FINDINGS: Four sessile polyps ranging from 2 to 62mm in size were found in the ascending colon (2), transverse colon, and descending colon.  Polypectomies were performed with cold forceps (2) and with a cold snare. (2)  The resection was complete, the polyp tissue was completely retrieved and sent to histology.   The examination was otherwise normal.  Retroflexed views revealed no abnormalities. The time to cecum = 3.1 Withdrawal time =  14.3   The scope was withdrawn and the procedure completed. COMPLICATIONS: There were no immediate complications.  ENDOSCOPIC IMPRESSION: 1.   Four sessile polyps ranging from 2 to 70mm in size were found in the ascending colon, transverse colon, and descending colon; polypectomies were performed with cold forceps and with a cold snare 2.   The examination was otherwise normal - excellent prep - second screening  RECOMMENDATIONS: Timing of repeat colonoscopy will be determined by pathology findings.  eSigned:  Gatha Mayer, MD, [facility] 07/23/2015 11:21 AM   cc: The Patient

## 2015-07-23 NOTE — Patient Instructions (Signed)
YOU HAD AN ENDOSCOPIC PROCEDURE TODAY AT THE [facility]:   Refer to the procedure report that was given to you for any specific questions about what was found during the examination.  If the procedure report does not answer your questions, please call your gastroenterologist to clarify.  If you requested that your care partner not be given the details of your procedure findings, then the procedure report has been included in a sealed envelope for you to review at your convenience later.  YOU SHOULD EXPECT: Some feelings of bloating in the abdomen. Passage of more gas than usual.  Walking can help get rid of the air that was put into your GI tract during the procedure and reduce the bloating. If you had a lower endoscopy (such as a colonoscopy or flexible sigmoidoscopy) you may notice spotting of blood in your stool or on the toilet paper. If you underwent a bowel prep for your procedure, you may not have a normal bowel movement for a few days.  Please Note:  You might notice some irritation and congestion in your nose or some drainage.  This is from the oxygen used during your procedure.  There is no need for concern and it should clear up in a day or so.  SYMPTOMS TO REPORT IMMEDIATELY:   Following lower endoscopy (colonoscopy or flexible sigmoidoscopy):  Excessive amounts of blood in the stool  Significant tenderness or worsening of abdominal pains  Swelling of the abdomen that is new, acute  Fever of 100F or higher   For urgent or emergent issues, a gastroenterologist can be reached at any hour by calling [phone].   DIET: Your first meal following the procedure should be a small meal and then it is ok to progress to your normal diet. Heavy or fried foods are harder to digest and may make you feel nauseous or bloated.  Likewise, meals heavy in dairy and vegetables can increase bloating.  Drink plenty of fluids but you should avoid alcoholic beverages for 24  hours.  ACTIVITY:  You should plan to take it easy for the rest of today and you should NOT DRIVE or use heavy machinery until tomorrow (because of the sedation medicines used during the test).    FOLLOW UP: Our staff will call the number listed on your records the next business day following your procedure to check on you and address any questions or concerns that you may have regarding the information given to you following your procedure. If we do not reach you, we will leave a message.  However, if you are feeling well and you are not experiencing any problems, there is no need to return our call.  We will assume that you have returned to your regular daily activities without incident.  If any biopsies were taken you will be contacted by phone or by letter within the next 1-3 weeks.  Please call us at [phone] if you have not heard about the biopsies in 3 weeks.    SIGNATURES/CONFIDENTIALITY: You and/or your care partner have signed paperwork which will be entered into your electronic medical record.  These signatures attest to the fact that that the information above on your After Visit Summary has been reviewed and is understood.  Full responsibility of the confidentiality of this discharge information lies with you and/or your care-partner. 

## 2015-07-24 ENCOUNTER — Telehealth: Payer: Self-pay | Admitting: *Deleted

## 2015-07-24 NOTE — Telephone Encounter (Signed)
  Follow up Call-  Call back number 07/23/2015  Post procedure Call Back phone  # [phone]  Permission to leave phone message Yes     Patient questions:  Do you have a fever, pain , or abdominal swelling? No. Pain Score  0 *  Have you tolerated food without any problems? Yes.    Have you been able to return to your normal activities? Yes.    Do you have any questions about your discharge instructions: Diet   No. Medications  No. Follow up visit  No.  Do you have questions or concerns about your Care? No.  Actions: * If pain score is 4 or above: No action needed, pain <4.

## 2015-07-28 ENCOUNTER — Encounter: Payer: Self-pay | Admitting: Internal Medicine

## 2015-07-28 DIAGNOSIS — Z86010 Personal history of colonic polyps: Secondary | ICD-10-CM

## 2015-07-28 DIAGNOSIS — Z860101 Personal history of adenomatous and serrated colon polyps: Secondary | ICD-10-CM

## 2015-07-28 HISTORY — DX: Personal history of colonic polyps: Z86.010

## 2015-07-28 HISTORY — DX: Personal history of adenomatous and serrated colon polyps: Z86.0101

## 2015-07-28 NOTE — Progress Notes (Signed)
Quick Note:  4 diminutive adenomas - repeat colonoscopy 2019 ______ 

## 2015-09-01 ENCOUNTER — Encounter: Payer: Self-pay | Admitting: Women's Health

## 2015-09-01 ENCOUNTER — Ambulatory Visit (INDEPENDENT_AMBULATORY_CARE_PROVIDER_SITE_OTHER): Payer: 59 | Admitting: Women's Health

## 2015-09-01 VITALS — BP 124/80 | Ht 63.0 in | Wt 187.0 lb

## 2015-09-01 DIAGNOSIS — N898 Other specified noninflammatory disorders of vagina: Secondary | ICD-10-CM | POA: Diagnosis not present

## 2015-09-01 DIAGNOSIS — A499 Bacterial infection, unspecified: Secondary | ICD-10-CM | POA: Diagnosis not present

## 2015-09-01 DIAGNOSIS — N760 Acute vaginitis: Secondary | ICD-10-CM | POA: Diagnosis not present

## 2015-09-01 DIAGNOSIS — R350 Frequency of micturition: Secondary | ICD-10-CM

## 2015-09-01 DIAGNOSIS — B9689 Other specified bacterial agents as the cause of diseases classified elsewhere: Secondary | ICD-10-CM

## 2015-09-01 LAB — URINALYSIS W MICROSCOPIC + REFLEX CULTURE
Bilirubin Urine: NEGATIVE
Casts: NONE SEEN [LPF]
Crystals: NONE SEEN [HPF]
GLUCOSE, UA: NEGATIVE
HGB URINE DIPSTICK: NEGATIVE
Ketones, ur: NEGATIVE
LEUKOCYTES UA: NEGATIVE
NITRITE: NEGATIVE
PH: 7 (ref 5.0–8.0)
Protein, ur: NEGATIVE
RBC / HPF: NONE SEEN RBC/HPF (ref <=2)
SPECIFIC GRAVITY, URINE: 1.02 (ref 1.001–1.035)
WBC UA: NONE SEEN WBC/HPF (ref <=5)
YEAST: NONE SEEN [HPF]

## 2015-09-01 LAB — WET PREP FOR TRICH, YEAST, CLUE
TRICH WET PREP: NONE SEEN
WBC WET PREP: NONE SEEN
YEAST WET PREP: NONE SEEN

## 2015-09-01 MED ORDER — METRONIDAZOLE 0.75 % VA GEL
VAGINAL | Status: DC
Start: 2015-09-01 — End: 2015-12-14

## 2015-09-01 MED ORDER — FLUCONAZOLE 100 MG PO TABS: 100.0000 mg | ORAL_TABLET | Freq: Every day | ORAL | Status: DC

## 2015-09-01 NOTE — Progress Notes (Addendum)
Patient ID: Debra Woodward, female   DOB: July 02, 1951, 64 y.o.   MRN: [id number] Presents with complaint of questionable vaginal infection. Has noted a increased odor, vaginal itching, mild urinary frequency without pain or burning. Denies abdominal pain or fever. TAH for fibroids on no HRT. Completed antibiotic 1 week ago.  Exam: Appears well. External genitalia within normal limits, speculum exam scant discharge with odor noted, wet prep positive for amines, clues, TNTC bacteria. Bimanual no adnexal tenderness. UA: Negative.  Bacteria vaginosis  Plan: MetroGel vaginal cream 1 applicator at bedtime 5, alcohol precautions reviewed. Diflucan 150 by mouth 1 dose if vaginal itching. Instructed to call if no relief of symptoms.

## 2015-09-01 NOTE — Patient Instructions (Signed)

## 2015-09-02 ENCOUNTER — Telehealth: Payer: Self-pay

## 2015-09-02 LAB — URINE CULTURE
COLONY COUNT: NO GROWTH
Organism ID, Bacteria: NO GROWTH

## 2015-09-02 NOTE — Telephone Encounter (Signed)
Patient called to review instructions for 2 Rx's NY gave her at visit yesterday. Instructions reviewed per NY's note in the chart.

## 2015-12-14 ENCOUNTER — Ambulatory Visit (INDEPENDENT_AMBULATORY_CARE_PROVIDER_SITE_OTHER): Payer: BLUE CROSS/BLUE SHIELD | Admitting: Internal Medicine

## 2015-12-14 ENCOUNTER — Encounter: Payer: Self-pay | Admitting: Internal Medicine

## 2015-12-14 VITALS — BP 140/90 | Temp 98.4°F | Wt 193.0 lb

## 2015-12-14 DIAGNOSIS — R51 Headache: Secondary | ICD-10-CM | POA: Diagnosis not present

## 2015-12-14 DIAGNOSIS — H9192 Unspecified hearing loss, left ear: Secondary | ICD-10-CM

## 2015-12-14 DIAGNOSIS — R519 Headache, unspecified: Secondary | ICD-10-CM

## 2015-12-14 DIAGNOSIS — R42 Dizziness and giddiness: Secondary | ICD-10-CM

## 2015-12-14 DIAGNOSIS — R030 Elevated blood-pressure reading, without diagnosis of hypertension: Secondary | ICD-10-CM

## 2015-12-14 DIAGNOSIS — IMO0001 Reserved for inherently not codable concepts without codable children: Secondary | ICD-10-CM

## 2015-12-14 NOTE — Patient Instructions (Signed)
Will send in referral for 10 am  Wednesday  Dr Lucia Gaskins .   For vertigo with hx of headache and left ear clogged fleeing.  Caution with decongestants  Can increase blood pressure.  As up some today .   Contact us or dr Lucia Gaskins if getting worse.  Repeat bp 140/90  Not causing the  Sx today .  Repeat when  Resting   And better .    Vertigo Vertigo means you feel like you or your surroundings are moving when they are not. Vertigo can be dangerous if it occurs when you are at work, driving, or performing difficult activities.  CAUSES  Vertigo occurs when there is a conflict of signals sent to your brain from the visual and sensory systems in your body. There are many different causes of vertigo, including:  Infections, especially in the inner ear.  A bad reaction to a drug or misuse of alcohol and medicines.  Withdrawal from drugs or alcohol.  Rapidly changing positions, such as lying down or rolling over in bed.  A migraine headache.  Decreased blood flow to the brain.  Increased pressure in the brain from a head injury, infection, tumor, or bleeding. SYMPTOMS  You may feel as though the world is spinning around or you are falling to the ground. Because your balance is upset, vertigo can cause nausea and vomiting. You may have involuntary eye movements (nystagmus). DIAGNOSIS  Vertigo is usually diagnosed by physical exam. If the cause of your vertigo is unknown, your caregiver may perform imaging tests, such as an MRI scan (magnetic resonance imaging). TREATMENT  Most cases of vertigo resolve on their own, without treatment. Depending on the cause, your caregiver may prescribe certain medicines. If your vertigo is related to body position issues, your caregiver may recommend movements or procedures to correct the problem. In rare cases, if your vertigo is caused by certain inner ear problems, you may need surgery. HOME CARE INSTRUCTIONS   Follow your caregiver's instructions.  Avoid  driving.  Avoid operating heavy machinery.  Avoid performing any tasks that would be dangerous to you or others during a vertigo episode.  Tell your caregiver if you notice that certain medicines seem to be causing your vertigo. Some of the medicines used to treat vertigo episodes can actually make them worse in some people. SEEK IMMEDIATE MEDICAL CARE IF:   Your medicines do not relieve your vertigo or are making it worse.  You develop problems with talking, walking, weakness, or using your arms, hands, or legs.  You develop severe headaches.  Your nausea or vomiting continues or gets worse.  You develop visual changes.  A family member notices behavioral changes.  Your condition gets worse. MAKE SURE YOU:  Understand these instructions.  Will watch your condition.  Will get help right away if you are not doing well or get worse.   This information is not intended to replace advice given to you by your health care provider. Make sure you discuss any questions you have with your health care provider.   Document Released: 08/10/2005 Document Revised: 01/23/2012 Document Reviewed: 02/23/2015 Elsevier Interactive Patient Education [employer].

## 2015-12-14 NOTE — Progress Notes (Signed)
Pre visit review using our clinic review tool, if applicable. No additional management support is needed unless otherwise documented below in the visit note.  Chief Complaint  Patient presents with  . Dizziness    Started on Jan 17th.  Has made an appt to see ENT on Wednesday morning.    HPI: Patient Debra Woodward  comes in today for SDA for  new problem evaluation.  Awoke  Am jan 17th  And  Had  Severe dizziness and  Left sided headache with clogged feeling left ear  Smidgen better .currently   Can drive now but   Has ppt  On Wednesday.  Took sinus pill at onset  ( sudafed )And   Nasal spray  flonase  The took the keflex she was prescribed for her hx of parotitis  And took for 5 days  If moves  Certain way then off balance  Looking down  Laying down  Had ha and dizzy left side throbbing  .  This was scarier  Than past  Episodes  .   antivert and other maneurvers didn't help in past  Seen for positional vertigo  March 2016 and  Given concservative therapy   Has appt dr Lucia Gaskins  Wed am 10 am  Needs referral  ROS: See pertinent positives and negatives per HPI.  Past Medical History  Diagnosis Date  . ALLERGIC RHINITIS 09/14/2006  . Fibroid   . Seasonal allergies   . Hx of adenomatous colonic polyps 07/28/2015    Family History  Problem Relation Age of Onset  . Stroke Mother   . Diabetes Mother   . Diabetes Father   . Hypertension Sister   . Esophageal cancer Paternal Grandfather 76  . Colon cancer Neg Hx   . Stomach cancer Neg Hx   . Rectal cancer Neg Hx     Social History   Social History  . Marital Status: Single    Spouse Name: N/A  . Number of Children: N/A  . Years of Education: N/A   Social History Main Topics  . Smoking status: Current Every Day Smoker -- 0.25 packs/day    Types: Cigarettes  . Smokeless tobacco: Never Used  . Alcohol Use: 0.0 oz/week    0 Standard drinks or equivalent per week     Comment: Rare  . Drug Use: No  . Sexual Activity: Yes   Birth Control/ Protection: Surgical     Comment: HYST   Other Topics Concern  . 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     Outpatient Prescriptions Prior to Visit  Medication Sig Dispense Refill  . carboxymethylcellulose 1 % ophthalmic solution Apply 1 drop to eye daily.    . celecoxib (CELEBREX) 200 MG capsule Take 1 capsule (200 mg total) by mouth 2 (two) times daily. 30 capsule 0  . Cholecalciferol (VITAMIN D3) 2000 UNITS capsule Take 2,000 Units by mouth daily.    Marland Kitchen Cod Liver Oil CAPS Take 1 capsule by mouth daily.    Marland Kitchen docusate sodium (COLACE) 100 MG capsule Take 100 mg by mouth 3 (three) times a week.     Marland Kitchen GINKGO BILOBA PO Take 2 capsules by mouth daily.    Marland Kitchen loteprednol (LOTEMAX) 0.2 % SUSP Place 1 drop into both eyes daily.    . Multiple Vitamin (MULTIVITAMIN)  tablet Take 1 tablet by mouth daily.      . Olopatadine HCl (PATADAY) 0.2 % SOLN Apply 1 drop to eye daily.    . TURMERIC PO Take 2 tablets by mouth daily.    . Cetirizine HCl (ZYRTEC ALLERGY PO) Take by mouth as needed. Reported on 12/14/2015    . fexofenadine (ALLEGRA) 180 MG tablet Take 180 mg by mouth daily. Reported on 12/14/2015    . meclizine (ANTIVERT) 12.5 MG tablet Take 1 tablet (12.5 mg total) by mouth 3 (three) times daily as needed for dizziness. (Patient not taking: Reported on 12/14/2015) 30 tablet 0  . fluconazole (DIFLUCAN) 100 MG tablet Take 1 tablet (100 mg total) by mouth daily. 30 tablet 0  . metroNIDAZOLE (METROGEL VAGINAL) 0.75 % vaginal gel 1 applicator per vagina at HS x 5 70 g 0   No facility-administered medications prior to visit.     EXAM:  BP 140/90 mmHg  Temp(Src) 98.4 F (36.9 C) (Oral)  Wt 193 lb (87.544 kg)  Body mass index is 34.2 kg/(m^2).  GENERAL: vitals reviewed and listed above, alert, oriented, appears well hydrated and in no acute distress  Keeping head still HEENT: atraumatic,  conjunctiva  clear, no obvious abnormalities on inspection of external nose and ears no faxce tendernss eoms nl  Tm intact min wax left ear  OP : no lesion edema or exudate  Tongue midline  NECK: no obvious masses on inspection palpation  No bruits  LUNGS: clear to auscultation bilaterally, no wheezes, rales or rhonchi,  CV: HRRR, no clubbing cyanosis or  peripheral edema nl cap refill  MS: moves all extremities without noticeable focal  abnormality PSYCH: pleasant and cooperative, no obvious depression or anxiety Neuro non focal  Cr n3-12 hearing not tested   Gait steady but gingerly  Slow   Slight drift back on rhomonberg no asymmetrical drift .   ASSESSMENT AND PLAN:  Discussed the following assessment and plan:  Vertigo - [persistent with hx of ha and esar sx  exam reassuing otherwise  agree with ent eval in wednesday Feb 1 reviewed alarm sx stay in flonase   Left-sided headache - at [facility] resolved but left ear fells clogged took 4 days of keflex  on own better now atypical for vestibular vertigo?  Elevated BP - borderline    -Patient advised to return or notify health care team  if symptoms worsen ,persist or new concerns arise.  Patient Instructions  Will send in referral for 10 am  Wednesday  Dr Lucia Gaskins .   For vertigo with hx of headache and left ear clogged fleeing.  Caution with decongestants  Can increase blood pressure.  As up some today .   Contact us or dr Lucia Gaskins if getting worse.  Repeat bp 140/90  Not causing the  Sx today .  Repeat when  Resting   And better .    Vertigo Vertigo means you feel like you or your surroundings are moving when they are not. Vertigo can be dangerous if it occurs when you are at work, driving, or performing difficult activities.  CAUSES  Vertigo occurs when there is a conflict of signals sent to your brain from the visual and sensory systems in your body. There are many different causes of vertigo, including:  Infections, especially in  the inner ear.  A bad reaction to a drug or misuse of alcohol and medicines.  Withdrawal from drugs or alcohol.  Rapidly changing positions, such as lying down  or rolling over in bed.  A migraine headache.  Decreased blood flow to the brain.  Increased pressure in the brain from a head injury, infection, tumor, or bleeding. SYMPTOMS  You may feel as though the world is spinning around or you are falling to the ground. Because your balance is upset, vertigo can cause nausea and vomiting. You may have involuntary eye movements (nystagmus). DIAGNOSIS  Vertigo is usually diagnosed by physical exam. If the cause of your vertigo is unknown, your caregiver may perform imaging tests, such as an MRI scan (magnetic resonance imaging). TREATMENT  Most cases of vertigo resolve on their own, without treatment. Depending on the cause, your caregiver may prescribe certain medicines. If your vertigo is related to body position issues, your caregiver may recommend movements or procedures to correct the problem. In rare cases, if your vertigo is caused by certain inner ear problems, you may need surgery. HOME CARE INSTRUCTIONS   Follow your caregiver's instructions.  Avoid driving.  Avoid operating heavy machinery.  Avoid performing any tasks that would be dangerous to you or others during a vertigo episode.  Tell your caregiver if you notice that certain medicines seem to be causing your vertigo. Some of the medicines used to treat vertigo episodes can actually make them worse in some people. SEEK IMMEDIATE MEDICAL CARE IF:   Your medicines do not relieve your vertigo or are making it worse.  You develop problems with talking, walking, weakness, or using your arms, hands, or legs.  You develop severe headaches.  Your nausea or vomiting continues or gets worse.  You develop visual changes.  A family member notices behavioral changes.  Your condition gets worse. MAKE SURE YOU:  Understand  these instructions.  Will watch your condition.  Will get help right away if you are not doing well or get worse.   This information is not intended to replace advice given to you by your health care provider. Make sure you discuss any questions you have with your health care provider.   Document Released: 08/10/2005 Document Revised: 01/23/2012 Document Reviewed: 02/23/2015 Elsevier Interactive Patient Education 2016 [place]. Panosh M.D.

## 2016-02-29 ENCOUNTER — Other Ambulatory Visit: Payer: Self-pay

## 2016-02-29 DIAGNOSIS — Z1231 Encounter for screening mammogram for malignant neoplasm of breast: Secondary | ICD-10-CM

## 2016-03-10 ENCOUNTER — Ambulatory Visit (INDEPENDENT_AMBULATORY_CARE_PROVIDER_SITE_OTHER): Payer: BLUE CROSS/BLUE SHIELD | Admitting: Women's Health

## 2016-03-10 ENCOUNTER — Encounter: Payer: Self-pay | Admitting: Women's Health

## 2016-03-10 VITALS — BP 130/80 | Ht 63.0 in | Wt 191.0 lb

## 2016-03-10 DIAGNOSIS — Z01419 Encounter for gynecological examination (general) (routine) without abnormal findings: Secondary | ICD-10-CM

## 2016-03-10 NOTE — Progress Notes (Signed)
Debra Woodward [birth year] [id number]    History:    Presents for annual exam.  1996 TAH for fibroids on no HRT. Normal Pap and mammogram history. 2016 T score -0.5 at hip, stable. Minimal smoker. Has had both Zostavax and Pneumovax at primary care. Hypertension managed primary care. 2006 negative colonoscopy, 2016 benign colon polyps follow-up 3 years.   Past medical history, past surgical history, family history and social history were all reviewed and documented in the EPIC chart. From [place]. Substitute elementary school teacher. Parents diabetes. 2 children, 2 grandchildren.  ROS:  A ROS was performed and pertinent positives and negatives are included.  Exam:  Filed Vitals:   03/10/16 0824  BP: 130/80    General appearance:  Normal Thyroid:  Symmetrical, normal in size, without palpable masses or nodularity. Respiratory  Auscultation:  Clear without wheezing or rhonchi Cardiovascular  Auscultation:  Regular rate, without rubs, murmurs or gallops  Edema/varicosities:  Not grossly evident Abdominal  Soft,nontender, without masses, guarding or rebound.  Liver/spleen:  No organomegaly noted  Hernia:  None appreciated  Skin  Inspection:  Grossly normal   Breasts: Examined lying and sitting.     Right: Without masses, retractions, discharge or axillary adenopathy.     Left: Without masses, retractions, discharge or axillary adenopathy. Gentitourinary   Inguinal/mons:  Normal without inguinal adenopathy  External genitalia:  Normal  BUS/Urethra/Skene's glands:  Normal  Vagina:  Normal  Cervix:  Aud uterus absent Adnexa/parametria:     Rt: Without masses or tenderness.   Lt: Without masses or tenderness.  Anus and perineum: Normal  Digital rectal exam: Normal sphincter tone without palpated masses or tenderness  Assessment/Plan:  65 y.o. SBF G3 P2 for annual exam with no complaints.  29 TAH for fibroids on no HRT Smoker-less than half pack daily Hypertension-primary  care manages labs and meds  Plan: SBE's, continue annual screening mammogram, calcium rich diet, vitamin D 2000 daily encouraged. Condoms encouraged if sexually active. Home safety, fall prevention and importance of weightbearing exercise reviewed. Aware of hazards of smoking process of quitting again. Encouraged decrease carbs and simple sugars in diet for weight loss. UAHuel Cote WHNP, 9:06 AM 03/10/2016

## 2016-03-10 NOTE — Patient Instructions (Addendum)
Will notify you  of labs when available. Continue to try to be tobacco free .  BP goal is below 140/90 .     Suggest  Take blood pressure readings twice a day for 7- 10 days and then periodically .To ensure below 140/90   .    Health Maintenance, Female Adopting a healthy lifestyle and getting preventive care can go a long way to promote health and wellness. Talk with your health care provider about what schedule of regular examinations is right for you. This is a good chance for you to check in with your provider about disease prevention and staying healthy. In between checkups, there are plenty of things you can do on your own. Experts have done a lot of research about which lifestyle changes and preventive measures are most likely to keep you healthy. Ask your health care provider for more information. WEIGHT AND DIET  Eat a healthy diet  Be sure to include plenty of vegetables, fruits, low-fat dairy products, and lean protein.  Do not eat a lot of foods high in solid fats, added sugars, or salt.  Get regular exercise. This is one of the most important things you can do for your health.  Most adults should exercise for at least 150 minutes each week. The exercise should increase your heart rate and make you sweat (moderate-intensity exercise).  Most adults should also do strengthening exercises at least twice a week. This is in addition to the moderate-intensity exercise.  Maintain a healthy weight  Body mass index (BMI) is a measurement that can be used to identify possible weight problems. It estimates body fat based on height and weight. Your health care provider can help determine your BMI and help you achieve or maintain a healthy weight.  For females 71 years of age and older:   A BMI below 18.5 is considered underweight.  A BMI of 18.5 to 24.9 is normal.  A BMI of 25 to 29.9 is considered overweight.  A BMI of 30 and above is considered obese.  Watch levels of  cholesterol and blood lipids  You should start having your blood tested for lipids and cholesterol at 65 years of age, then have this test every 5 years.  You may need to have your cholesterol levels checked more often if:  Your lipid or cholesterol levels are high.  You are older than 65 years of age.  You are at high risk for heart disease.  CANCER SCREENING   Lung Cancer  Lung cancer screening is recommended for adults 10-68 years old who are at high risk for lung cancer because of a history of smoking.  A yearly low-dose CT scan of the lungs is recommended for people who:  Currently smoke.  Have quit within the past 15 years.  Have at least a 30-pack-year history of smoking. A pack year is smoking an average of one pack of cigarettes a day for 1 year.  Yearly screening should continue until it has been 15 years since you quit.  Yearly screening should stop if you develop a health problem that would prevent you from having lung cancer treatment.  Breast Cancer  Practice breast self-awareness. This means understanding how your breasts normally appear and feel.  It also means doing regular breast self-exams. Let your health care provider know about any changes, no matter how small.  If you are in your 20s or 30s, you should have a clinical breast exam (CBE) by a health care provider  every 1-3 years as part of a regular health exam.  If you are 25 or older, have a CBE every year. Also consider having a breast X-ray (mammogram) every year.  If you have a family history of breast cancer, talk to your health care provider about genetic screening.  If you are at high risk for breast cancer, talk to your health care provider about having an MRI and a mammogram every year.  Breast cancer gene (BRCA) assessment is recommended for women who have family members with BRCA-related cancers. BRCA-related cancers include:  Breast.  Ovarian.  Tubal.  Peritoneal  cancers.  Results of the assessment will determine the need for genetic counseling and BRCA1 and BRCA2 testing. Cervical Cancer Your health care provider may recommend that you be screened regularly for cancer of the pelvic organs (ovaries, uterus, and vagina). This screening involves a pelvic examination, including checking for microscopic changes to the surface of your cervix (Pap test). You may be encouraged to have this screening done every 3 years, beginning at age 74.  For women ages 54-65, health care providers may recommend pelvic exams and Pap testing every 3 years, or they may recommend the Pap and pelvic exam, combined with testing for human papilloma virus (HPV), every 5 years. Some types of HPV increase your risk of cervical cancer. Testing for HPV may also be done on women of any age with unclear Pap test results.  Other health care providers may not recommend any screening for nonpregnant women who are considered low risk for pelvic cancer and who do not have symptoms. Ask your health care provider if a screening pelvic exam is right for you.  If you have had past treatment for cervical cancer or a condition that could lead to cancer, you need Pap tests and screening for cancer for at least 20 years after your treatment. If Pap tests have been discontinued, your risk factors (such as having a new sexual partner) need to be reassessed to determine if screening should resume. Some women have medical problems that increase the chance of getting cervical cancer. In these cases, your health care provider may recommend more frequent screening and Pap tests. Colorectal Cancer  This type of cancer can be detected and often prevented.  Routine colorectal cancer screening usually begins at 65 years of age and continues through 66 years of age.  Your health care provider may recommend screening at an earlier age if you have risk factors for colon cancer.  Your health care provider may also  recommend using home test kits to check for hidden blood in the stool.  A small camera at the end of a tube can be used to examine your colon directly (sigmoidoscopy or colonoscopy). This is done to check for the earliest forms of colorectal cancer.  Routine screening usually begins at age 48.  Direct examination of the colon should be repeated every 5-10 years through 65 years of age. However, you may need to be screened more often if early forms of precancerous polyps or small growths are found. Skin Cancer  Check your skin from head to toe regularly.  Tell your health care provider about any new moles or changes in moles, especially if there is a change in a mole's shape or color.  Also tell your health care provider if you have a mole that is larger than the size of a pencil eraser.  Always use sunscreen. Apply sunscreen liberally and repeatedly throughout the day.  Protect yourself by  wearing long sleeves, pants, a wide-brimmed hat, and sunglasses whenever you are outside. HEART DISEASE, DIABETES, AND HIGH BLOOD PRESSURE   High blood pressure causes heart disease and increases the risk of stroke. High blood pressure is more likely to develop in:  People who have blood pressure in the high end of the normal range (130-139/85-89 mm Hg).  People who are overweight or obese.  People who are African American.  If you are 24-43 years of age, have your blood pressure checked every 3-5 years. If you are 12 years of age or older, have your blood pressure checked every year. You should have your blood pressure measured twice--once when you are at a hospital or clinic, and once when you are not at a hospital or clinic. Record the average of the two measurements. To check your blood pressure when you are not at a hospital or clinic, you can use:  An automated blood pressure machine at a pharmacy.  A home blood pressure monitor.  If you are between 64 years and 9 years old, ask your health  care provider if you should take aspirin to prevent strokes.  Have regular diabetes screenings. This involves taking a blood sample to check your fasting blood sugar level.  If you are at a normal weight and have a low risk for diabetes, have this test once every three years after 65 years of age.  If you are overweight and have a high risk for diabetes, consider being tested at a younger age or more often. PREVENTING INFECTION  Hepatitis B  If you have a higher risk for hepatitis B, you should be screened for this virus. You are considered at high risk for hepatitis B if:  You were born in a country where hepatitis B is common. Ask your health care provider which countries are considered high risk.  Your parents were born in a high-risk country, and you have not been immunized against hepatitis B (hepatitis B vaccine).  You have HIV or AIDS.  You use needles to inject street drugs.  You live with someone who has hepatitis B.  You have had sex with someone who has hepatitis B.  You get hemodialysis treatment.  You take certain medicines for conditions, including cancer, organ transplantation, and autoimmune conditions. Hepatitis C  Blood testing is recommended for:  Everyone born from [birth year] through 1965.  Anyone with known risk factors for hepatitis C. Sexually transmitted infections (STIs)  You should be screened for sexually transmitted infections (STIs) including gonorrhea and chlamydia if:  You are sexually active and are younger than 65 years of age.  You are older than 65 years of age and your health care provider tells you that you are at risk for this type of infection.  Your sexual activity has changed since you were last screened and you are at an increased risk for chlamydia or gonorrhea. Ask your health care provider if you are at risk.  If you do not have HIV, but are at risk, it may be recommended that you take a prescription medicine daily to prevent HIV  infection. This is called pre-exposure prophylaxis (PrEP). You are considered at risk if:  You are sexually active and do not regularly use condoms or know the HIV status of your partner(s).  You take drugs by injection.  You are sexually active with a partner who has HIV. Talk with your health care provider about whether you are at high risk of being infected with HIV. If  you choose to begin PrEP, you should first be tested for HIV. You should then be tested every 3 months for as long as you are taking PrEP.  PREGNANCY   If you are premenopausal and you may become pregnant, ask your health care provider about preconception counseling.  If you may become pregnant, take 400 to 800 micrograms (mcg) of folic acid every day.  If you want to prevent pregnancy, talk to your health care provider about birth control (contraception). OSTEOPOROSIS AND MENOPAUSE   Osteoporosis is a disease in which the bones lose minerals and strength with aging. This can result in serious bone fractures. Your risk for osteoporosis can be identified using a bone density scan.  If you are 76 years of age or older, or if you are at risk for osteoporosis and fractures, ask your health care provider if you should be screened.  Ask your health care provider whether you should take a calcium or vitamin D supplement to lower your risk for osteoporosis.  Menopause may have certain physical symptoms and risks.  Hormone replacement therapy may reduce some of these symptoms and risks. Talk to your health care provider about whether hormone replacement therapy is right for you.  HOME CARE INSTRUCTIONS   Schedule regular health, dental, and eye exams.  Stay current with your immunizations.   Do not use any tobacco products including cigarettes, chewing tobacco, or electronic cigarettes.  If you are pregnant, do not drink alcohol.  If you are breastfeeding, limit how much and how often you drink alcohol.  Limit  alcohol intake to no more than 1 drink per day for nonpregnant women. One drink equals 12 ounces of beer, 5 ounces of wine, or 1 ounces of hard liquor.  Do not use street drugs.  Do not share needles.  Ask your health care provider for help if you need support or information about quitting drugs.  Tell your health care provider if you often feel depressed.  Tell your health care provider if you have ever been abused or do not feel safe at home.   This information is not intended to replace advice given to you by your health care provider. Make sure you discuss any questions you have with your health care provider.   Document Released: 05/16/2011 Document Revised: 11/21/2014 Document Reviewed: 10/02/2013 Elsevier Interactive Patient Education 2016 [employer]   Benign Positional Vertigo Vertigo is the feeling that you or your surroundings are moving when they are not. Benign positional vertigo is the most common form of vertigo. The cause of this condition is not serious (is benign). This condition is triggered by certain movements and positions (is positional). This condition can be dangerous if it occurs while you are doing something that could endanger you or others, such as driving.  CAUSES In many cases, the cause of this condition is not known. It may be caused by a disturbance in an area of the inner ear that helps your brain to sense movement and balance. This disturbance can be caused by a viral infection (labyrinthitis), head injury, or repetitive motion. RISK FACTORS This condition is more likely to develop in:  Women.  People who are 73 years of age or older. SYMPTOMS Symptoms of this condition usually happen when you move your head or your eyes in different directions. Symptoms may start suddenly, and they usually last for less than a minute. Symptoms may include:  Loss of balance and falling.  Feeling like you are spinning or moving.  Feeling like your surroundings  are spinning or moving.  Nausea and vomiting.  Blurred vision.  Dizziness.  Involuntary eye movement (nystagmus). Symptoms can be mild and cause only slight annoyance, or they can be severe and interfere with daily life. Episodes of benign positional vertigo may return (recur) over time, and they may be triggered by certain movements. Symptoms may improve over time. DIAGNOSIS This condition is usually diagnosed by medical history and a physical exam of the head, neck, and ears. You may be referred to a health care provider who specializes in ear, nose, and throat (ENT) problems (otolaryngologist) or a provider who specializes in disorders of the nervous system (neurologist). You may have additional testing, including:  MRI.  A CT scan.  Eye movement tests. Your health care provider may ask you to change positions quickly while he or she watches you for symptoms of benign positional vertigo, such as nystagmus. Eye movement may be tested with an electronystagmogram (ENG), caloric stimulation, the Dix-Hallpike test, or the roll test.  An electroencephalogram (EEG). This records electrical activity in your brain.  Hearing tests. TREATMENT Usually, your health care provider will treat this by moving your head in specific positions to adjust your inner ear back to normal. Surgery may be needed in severe cases, but this is rare. In some cases, benign positional vertigo may resolve on its own in 2-4 weeks. HOME CARE INSTRUCTIONS Safety  Move slowly.Avoid sudden body or head movements.  Avoid driving.  Avoid operating heavy machinery.  Avoid doing any tasks that would be dangerous to you or others if a vertigo episode would occur.  If you have trouble walking or keeping your balance, try using a cane for stability. If you feel dizzy or unstable, sit down right away.  Return to your normal activities as told by your health care provider. Ask your health care provider what activities are  safe for you. General Instructions  Take over-the-counter and prescription medicines only as told by your health care provider.  Avoid certain positions or movements as told by your health care provider.  Drink enough fluid to keep your urine clear or pale yellow.  Keep all follow-up visits as told by your health care provider. This is important. SEEK MEDICAL CARE IF:  You have a fever.  Your condition gets worse or you develop new symptoms.  Your family or friends notice any behavioral changes.  Your nausea or vomiting gets worse.  You have numbness or a "pins and needles" sensation. SEEK IMMEDIATE MEDICAL CARE IF:  You have difficulty speaking or moving.  You are always dizzy.  You faint.  You develop severe headaches.  You have weakness in your legs or arms.  You have changes in your hearing or vision.  You develop a stiff neck.  You develop sensitivity to light.   This information is not intended to replace advice given to you by your health care provider. Make sure you discuss any questions you have with your health care provider.   Document Released: 08/08/2006 Document Revised: 07/22/2015 Document Reviewed: 02/23/2015 [employer] Interactive Patient Education [employer].

## 2016-03-10 NOTE — Progress Notes (Signed)
Pre visit review using our clinic review tool, if applicable. No additional management support is needed unless otherwise documented below in the visit note.  Chief Complaint  Patient presents with  . Annual Exam    HPI: Patient  Debra Woodward  64 y.o. comes in today for Preventive Health Care visit  Colon every 5  Years for polyp.   tryin tto stop tobacco  Feels pretty healthy except recurrent vertigo like sx when lays back  intermittentey not all the time  Poss inc in thea llergy season  Has see dr Lucia Gaskins for this and to gp back if  persistent or progressive  For more evaluation.  To get mammo in may Just had gyne check   Health Maintenance  Topic Date Due  . PAP SMEAR  08/14/2014  . INFLUENZA VACCINE  06/14/2016  . MAMMOGRAM  04/05/2017  . COLONOSCOPY  07/22/2018  . TETANUS/TDAP  03/14/2019  . ZOSTAVAX  Completed  . Hepatitis C Screening  Completed  . HIV Screening  Completed   Health Maintenance Review LIFESTYLE:  Exercise:   Walk tries daily  Tobacco/ETS: in a blu moon  Carton lasts  3 weeks a month .  Alcohol:     Wine cooler every 2 weeks  Sugar beverages:  No sodas. Tea once a week.  Sleep:  6 +  Drug use: no hh of 1     ROS:  GEN/ HEENT: No fever, significant weight changes sweats headaches vision problems hearing changes, CV/ PULM; No chest pain shortness of breath cough, syncope,edema  change in exercise tolerance. GI /GU: No adominal pain, vomiting, change in bowel habits. No blood in the stool. No significant GU symptoms. SKIN/HEME: ,no acute skin rashes suspicious lesions or bleeding. No lymphadenopathy, nodules, masses.  NEURO/ PSYCH:  No neurologic signs such as weakness numbness. No depression anxiety. IMM/ Allergy: No unusual infections.  Allergy .   REST of 12 system review negative except as per HPI   Past Medical History  Diagnosis Date  . ALLERGIC RHINITIS 09/14/2006  . Fibroid   . Seasonal allergies   . Hx of adenomatous colonic polyps  07/28/2015    Past Surgical History  Procedure Laterality Date  . Abdominal hysterectomy  1996  . Knee arthroscopy Right 2010  . Carpal tunnel release Right 2008  . Toe surgery Bilateral   . Breast surgery Bilateral 1983, 1996    benign  . Cystectomy Bilateral 1983, 1988    breast; benign  . Total knee arthroplasty Right 2011  . Colonoscopy      Family History  Problem Relation Age of Onset  . Stroke Mother   . Diabetes Mother   . Diabetes Father   . Hypertension Sister   . Esophageal cancer Paternal Grandfather 40  . Colon cancer Neg Hx   . Stomach cancer Neg Hx   . Rectal cancer Neg Hx     Social History   Social History  . Marital Status: Single    Spouse Name: N/A  . Number of Children: N/A  . Years of Education: N/A   Social History Main Topics  . Smoking status: Current Every Day Smoker -- 0.25 packs/day    Types: Cigarettes  . Smokeless tobacco: Never Used  . Alcohol Use: 0.0 oz/week    0 Standard drinks or equivalent per week     Comment: Rare  . Drug Use: No  . Sexual Activity: Yes    Birth Control/ Protection: Surgical     Comment:  HYST   Other Topics Concern  . 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     Outpatient Prescriptions Prior to Visit  Medication Sig Dispense Refill  . carboxymethylcellulose 1 % ophthalmic solution Apply 1 drop to eye daily.    . celecoxib (CELEBREX) 200 MG capsule Take 1 capsule (200 mg total) by mouth 2 (two) times daily. 30 capsule 0  . Cetirizine HCl (ZYRTEC ALLERGY PO) Take by mouth as needed. Reported on 12/14/2015    . Cholecalciferol (VITAMIN D3) 2000 UNITS capsule Take 2,000 Units by mouth daily.    Marland Kitchen Cod Liver Oil CAPS Take 1 capsule by mouth daily.    Marland Kitchen docusate sodium (COLACE) 100 MG capsule Take 100 mg by mouth 3 (three) times a week.     . fexofenadine (ALLEGRA) 180 MG tablet Take 180 mg by mouth daily. Reported on  12/14/2015    . GINKGO BILOBA PO Take 2 capsules by mouth daily.    Marland Kitchen loteprednol (LOTEMAX) 0.2 % SUSP Place 1 drop into both eyes daily.    . meclizine (ANTIVERT) 12.5 MG tablet Take 1 tablet (12.5 mg total) by mouth 3 (three) times daily as needed for dizziness. 30 tablet 0  . Multiple Vitamin (MULTIVITAMIN) tablet Take 1 tablet by mouth daily.      . Olopatadine HCl (PATADAY) 0.2 % SOLN Apply 1 drop to eye daily.    . TURMERIC PO Take 2 tablets by mouth daily.     No facility-administered medications prior to visit.     EXAM:  BP 138/90 mmHg  Temp(Src) 98.2 F (36.8 C) (Oral)  Ht _0  (1.626 m)  Wt 192 lb 3.2 oz (87.181 kg)  BMI 32.97 kg/m2  Body mass index is 32.97 kg/(m^2).  Physical Exam: Vital signs reviewed KKO:ECXF is a well-developed well-nourished alert cooperative    who appearsr stated age in no acute distress.  HEENT: normocephalic atraumatic , Eyes: PERRL EOM's full, conjunctiva clear, Nares: paten,t no deformity discharge or tenderness., Ears: no deformity EAC's clear TMs with normal landmarks. Mouth: clear OP, no lesions, edema.  Moist mucous membranes. Dentition in adequate repair. NECK: supple without masses, thyromegaly or bruits. CHEST/PULM:  Clear to auscultation and percussion breath sounds equal no wheeze , rales or rhonchi. No chest wall deformities or tenderness.Breast: normal by inspection . No dimpling, discharge, masses, tenderness or discharge . CV: PMI is nondisplaced, S1 S2 no gallops, murmurs, rubs. Peripheral pulses are full without delay.No JVD .  ABDOMEN: Bowel sounds normal nontender  No guard or rebound, no hepato splenomegal no CVA tenderness.  No hernia. Extremtities:  No clubbing cyanosis or edema, no acute joint swelling or redness no focal atrophy NEURO:  Oriented x3, cranial nerves 3-12 appear to be intact, no obvious focal weakness,gait within normal limits no abnormal reflexes or asymmetrical SKIN: No acute rashes normal turgor, color, no  bruising or petechiae. PSYCH: Oriented, good eye contact, no obvious depression anxiety, cognition and judgment appear normal. LN: no cervical axillary inguinal adenopathy    ASSESSMENT AND PLAN:  Discussed the following assessment and plan:  Visit for preventive health examination - Plan: Basic metabolic panel, CBC with Differential/Platelet, Lipid panel, TSH, Hepatic function panel, VITAMIN D 25 Hydroxy (Vit-D Deficiency, Fractures)  Hx of adenomatous colonic polyps - on q 5 year recall  Vitamin D deficiency - Plan: VITAMIN D 25 Hydroxy (Vit-D Deficiency, Fractures)  Elevated BP - recent caertaker for dying person will check and fu if elevated persiste  Allergic rhinitis, unspecified allergic rhinitis type  Vertigo - sounds bpvv intermittent sx  can fu ent expectant managment  Reviewed monitoring BP  and dash diet and mediterranean diet  .   To help heart healthy  Advised  Continue to stop tobacco ( doesn't smoke some days) cont exercise  Patient Care Team: Burnis Medin, MD as PCP - General (Internal Medicine) Huel Cote, NP as Nurse Practitioner (Obstetrics and Gynecology) Patient Instructions  Will notify you  of labs when available. Continue to try to be tobacco free .  BP goal is below 140/90 .     Suggest  Take blood pressure readings twice a day for 7- 10 days and then periodically .To ensure below 140/90   .    Health Maintenance, Female Adopting a healthy lifestyle and getting preventive care can go a long way to promote health and wellness. Talk with your health care provider about what schedule of regular examinations is right for you. This is a good chance for you to check in with your provider about disease prevention and staying healthy. In between checkups, there are plenty of things you can do on your own. Experts have done a lot of research about which lifestyle changes and preventive measures are most likely to keep you healthy. Ask your health care provider  for more information. WEIGHT AND DIET  Eat a healthy diet  Be sure to include plenty of vegetables, fruits, low-fat dairy products, and lean protein.  Do not eat a lot of foods high in solid fats, added sugars, or salt.  Get regular exercise. This is one of the most important things you can do for your health.  Most adults should exercise for at least 150 minutes each week. The exercise should increase your heart rate and make you sweat (moderate-intensity exercise).  Most adults should also do strengthening exercises at least twice a week. This is in addition to the moderate-intensity exercise.  Maintain a healthy weight  Body mass index (BMI) is a measurement that can be used to identify possible weight problems. It estimates body fat based on height and weight. Your health care provider can help determine your BMI and help you achieve or maintain a healthy weight.  For females 32 years of age and older:   A BMI below 18.5 is considered underweight.  A BMI of 18.5 to 24.9 is normal.  A BMI of 25 to 29.9 is considered overweight.  A BMI of 30 and above is considered obese.  Watch levels of cholesterol and blood lipids  You should start having your blood tested for lipids and cholesterol at 65 years of age, then have this test every 5 years.  You may need to have your cholesterol levels checked more often if:  Your lipid or cholesterol levels are high.  You are older than 65 years of age.  You are at high risk for heart disease.  CANCER SCREENING   Lung Cancer  Lung cancer screening is recommended for adults 25-31 years old who are at high risk for lung cancer because of a history of smoking.  A yearly low-dose CT scan of the lungs is recommended for people who:  Currently smoke.  Have quit within the past 15 years.  Have at least a 30-pack-year history of smoking. A pack year  is smoking an average of one pack of cigarettes a day for 1 year.  Yearly screening  should continue until it has been 15 years since you quit.  Yearly screening should stop if you develop a health problem that would prevent you from having lung cancer treatment.  Breast Cancer  Practice breast self-awareness. This means understanding how your breasts normally appear and feel.  It also means doing regular breast self-exams. Let your health care provider know about any changes, no matter how small.  If you are in your 20s or 30s, you should have a clinical breast exam (CBE) by a health care provider every 1-3 years as part of a regular health exam.  If you are 15 or older, have a CBE every year. Also consider having a breast X-ray (mammogram) every year.  If you have a family history of breast cancer, talk to your health care provider about genetic screening.  If you are at high risk for breast cancer, talk to your health care provider about having an MRI and a mammogram every year.  Breast cancer gene (BRCA) assessment is recommended for women who have family members with BRCA-related cancers. BRCA-related cancers include:  Breast.  Ovarian.  Tubal.  Peritoneal cancers.  Results of the assessment will determine the need for genetic counseling and BRCA1 and BRCA2 testing. Cervical Cancer Your health care provider may recommend that you be screened regularly for cancer of the pelvic organs (ovaries, uterus, and vagina). This screening involves a pelvic examination, including checking for microscopic changes to the surface of your cervix (Pap test). You may be encouraged to have this screening done every 3 years, beginning at age 67.  For women ages 30-65, health care providers may recommend pelvic exams and Pap testing every 3 years, or they may recommend the Pap and pelvic exam, combined with testing for human papilloma virus (HPV), every 5 years. Some types of HPV increase your risk of cervical cancer. Testing for HPV may also be done on women of any age with unclear  Pap test results.  Other health care providers may not recommend any screening for nonpregnant women who are considered low risk for pelvic cancer and who do not have symptoms. Ask your health care provider if a screening pelvic exam is right for you.  If you have had past treatment for cervical cancer or a condition that could lead to cancer, you need Pap tests and screening for cancer for at least 20 years after your treatment. If Pap tests have been discontinued, your risk factors (such as having a new sexual partner) need to be reassessed to determine if screening should resume. Some women have medical problems that increase the chance of getting cervical cancer. In these cases, your health care provider may recommend more frequent screening and Pap tests. Colorectal Cancer  This type of cancer can be detected and often prevented.  Routine colorectal cancer screening usually begins at 65 years of age and continues through 65 years of age.  Your health care provider may recommend screening at an earlier age if you have risk factors for colon cancer.  Your health care provider may also recommend using home test kits to check for hidden blood in the stool.  A small camera at the end of a tube can be used to examine your colon directly (sigmoidoscopy or colonoscopy). This is done to check for the earliest forms of colorectal cancer.  Routine screening usually begins at age 68.  Direct examination of  the colon should be repeated every 5-10 years through 65 years of age. However, you may need to be screened more often if early forms of precancerous polyps or small growths are found. Skin Cancer  Check your skin from head to toe regularly.  Tell your health care provider about any new moles or changes in moles, especially if there is a change in a mole's shape or color.  Also tell your health care provider if you have a mole that is larger than the size of a pencil eraser.  Always use  sunscreen. Apply sunscreen liberally and repeatedly throughout the day.  Protect yourself by wearing long sleeves, pants, a wide-brimmed hat, and sunglasses whenever you are outside. HEART DISEASE, DIABETES, AND HIGH BLOOD PRESSURE   High blood pressure causes heart disease and increases the risk of stroke. High blood pressure is more likely to develop in:  People who have blood pressure in the high end of the normal range (130-139/85-89 mm Hg).  People who are overweight or obese.  People who are African American.  If you are 44-68 years of age, have your blood pressure checked every 3-5 years. If you are 73 years of age or older, have your blood pressure checked every year. You should have your blood pressure measured twice--once when you are at a hospital or clinic, and once when you are not at a hospital or clinic. Record the average of the two measurements. To check your blood pressure when you are not at a hospital or clinic, you can use:  An automated blood pressure machine at a pharmacy.  A home blood pressure monitor.  If you are between 68 years and 62 years old, ask your health care provider if you should take aspirin to prevent strokes.  Have regular diabetes screenings. This involves taking a blood sample to check your fasting blood sugar level.  If you are at a normal weight and have a low risk for diabetes, have this test once every three years after 65 years of age.  If you are overweight and have a high risk for diabetes, consider being tested at a younger age or more often. PREVENTING INFECTION  Hepatitis B  If you have a higher risk for hepatitis B, you should be screened for this virus. You are considered at high risk for hepatitis B if:  You were born in a country where hepatitis B is common. Ask your health care provider which countries are considered high risk.  Your parents were born in a high-risk country, and you have not been immunized against hepatitis B  (hepatitis B vaccine).  You have HIV or AIDS.  You use needles to inject street drugs.  You live with someone who has hepatitis B.  You have had sex with someone who has hepatitis B.  You get hemodialysis treatment.  You take certain medicines for conditions, including cancer, organ transplantation, and autoimmune conditions. Hepatitis C  Blood testing is recommended for:  Everyone born from [birth year] through 1965.  Anyone with known risk factors for hepatitis C. Sexually transmitted infections (STIs)  You should be screened for sexually transmitted infections (STIs) including gonorrhea and chlamydia if:  You are sexually active and are younger than 65 years of age.  You are older than 65 years of age and your health care provider tells you that you are at risk for this type of infection.  Your sexual activity has changed since you were last screened and you are at an increased  risk for chlamydia or gonorrhea. Ask your health care provider if you are at risk.  If you do not have HIV, but are at risk, it may be recommended that you take a prescription medicine daily to prevent HIV infection. This is called pre-exposure prophylaxis (PrEP). You are considered at risk if:  You are sexually active and do not regularly use condoms or know the HIV status of your partner(s).  You take drugs by injection.  You are sexually active with a partner who has HIV. Talk with your health care provider about whether you are at high risk of being infected with HIV. If you choose to begin PrEP, you should first be tested for HIV. You should then be tested every 3 months for as long as you are taking PrEP.  PREGNANCY   If you are premenopausal and you may become pregnant, ask your health care provider about preconception counseling.  If you may become pregnant, take 400 to 800 micrograms (mcg) of folic acid every day.  If you want to prevent pregnancy, talk to your health care provider about birth  control (contraception). OSTEOPOROSIS AND MENOPAUSE   Osteoporosis is a disease in which the bones lose minerals and strength with aging. This can result in serious bone fractures. Your risk for osteoporosis can be identified using a bone density scan.  If you are 32 years of age or older, or if you are at risk for osteoporosis and fractures, ask your health care provider if you should be screened.  Ask your health care provider whether you should take a calcium or vitamin D supplement to lower your risk for osteoporosis.  Menopause may have certain physical symptoms and risks.  Hormone replacement therapy may reduce some of these symptoms and risks. Talk to your health care provider about whether hormone replacement therapy is right for you.  HOME CARE INSTRUCTIONS   Schedule regular health, dental, and eye exams.  Stay current with your immunizations.   Do not use any tobacco products including cigarettes, chewing tobacco, or electronic cigarettes.  If you are pregnant, do not drink alcohol.  If you are breastfeeding, limit how much and how often you drink alcohol.  Limit alcohol intake to no more than 1 drink per day for nonpregnant women. One drink equals 12 ounces of beer, 5 ounces of wine, or 1 ounces of hard liquor.  Do not use street drugs.  Do not share needles.  Ask your health care provider for help if you need support or information about quitting drugs.  Tell your health care provider if you often feel depressed.  Tell your health care provider if you have ever been abused or do not feel safe at home.   This information is not intended to replace advice given to you by your health care provider. Make sure you discuss any questions you have with your health care provider.   Document Released: 05/16/2011 Document Revised: 11/21/2014 Document Reviewed: 10/02/2013 Elsevier Interactive Patient Education 2016 Else[employer] Inc[employer]   Benign Positional Vertigo Vertigo is  the feeling that you or your surroundings are moving when they are not. Benign positional vertigo is the most common form of vertigo. The cause of this condition is not serious (is benign). This condition is triggered by certain movements and positions (is positional). This condition can be dangerous if it occurs while you are doing something that could endanger you or others, such as driving.  CAUSES In many cases, the cause of this condition is not  known. It may be caused by a disturbance in an area of the inner ear that helps your brain to sense movement and balance. This disturbance can be caused by a viral infection (labyrinthitis), head injury, or repetitive motion. RISK FACTORS This condition is more likely to develop in:  Women.  People who are 50 years of age or older. SYMPTOMS Symptoms of this condition usually happen when you move your head or your eyes in different directions. Symptoms may start suddenly, and they usually last for less than a minute. Symptoms may include:  Loss of balance and falling.  Feeling like you are spinning or moving.  Feeling like your surroundings are spinning or moving.  Nausea and vomiting.  Blurred vision.  Dizziness.  Involuntary eye movement (nystagmus). Symptoms can be mild and cause only slight annoyance, or they can be severe and interfere with daily life. Episodes of benign positional vertigo may return (recur) over time, and they may be triggered by certain movements. Symptoms may improve over time. DIAGNOSIS This condition is usually diagnosed by medical history and a physical exam of the head, neck, and ears. You may be referred to a health care provider who specializes in ear, nose, and throat (ENT) problems (otolaryngologist) or a provider who specializes in disorders of the nervous system (neurologist). You may have additional testing, including:  MRI.  A CT scan.  Eye movement tests. Your health care provider may ask you to  change positions quickly while he or she watches you for symptoms of benign positional vertigo, such as nystagmus. Eye movement may be tested with an electronystagmogram (ENG), caloric stimulation, the Dix-Hallpike test, or the roll test.  An electroencephalogram (EEG). This records electrical activity in your brain.  Hearing tests. TREATMENT Usually, your health care provider will treat this by moving your head in specific positions to adjust your inner ear back to normal. Surgery may be needed in severe cases, but this is rare. In some cases, benign positional vertigo may resolve on its own in 2-4 weeks. HOME CARE INSTRUCTIONS Safety  Move slowly.Avoid sudden body or head movements.  Avoid driving.  Avoid operating heavy machinery.  Avoid doing any tasks that would be dangerous to you or others if a vertigo episode would occur.  If you have trouble walking or keeping your balance, try using a cane for stability. If you feel dizzy or unstable, sit down right away.  Return to your normal activities as told by your health care provider. Ask your health care provider what activities are safe for you. General Instructions  Take over-the-counter and prescription medicines only as told by your health care provider.  Avoid certain positions or movements as told by your health care provider.  Drink enough fluid to keep your urine clear or pale yellow.  Keep all follow-up visits as told by your health care provider. This is important. SEEK MEDICAL CARE IF:  You have a fever.  Your condition gets worse or you develop new symptoms.  Your family or friends notice any behavioral changes.  Your nausea or vomiting gets worse.  You have numbness or a "pins and needles" sensation. SEEK IMMEDIATE MEDICAL CARE IF:  You have difficulty speaking or moving.  You are always dizzy.  You faint.  You develop severe headaches.  You have weakness in your legs or arms.  You have changes in  your hearing or vision.  You develop a stiff neck.  You develop sensitivity to light.   This information is not  intended to replace advice given to you by your health care provider. Make sure you discuss any questions you have with your health care provider.   Document Released: 08/08/2006 Document Revised: 07/22/2015 Document Reviewed: 02/23/2015 Elsevier Interactive Patient Education 2016 [place]. Glorie Dowlen M.D.     Chief Complaint  Patient presents with  . Annual Exam    HPI: Patient  Debra Woodward  65 y.o. comes in today for Preventive Health Care visit   Health Maintenance  Topic Date Due  . PAP SMEAR  08/14/2014  . INFLUENZA VACCINE  06/14/2016  . MAMMOGRAM  04/05/2017  . COLONOSCOPY  07/22/2018  . TETANUS/TDAP  03/14/2019  . ZOSTAVAX  Completed  . Hepatitis C Screening  Completed  . HIV Screening  Completed   Health Maintenance Review LIFESTYLE:  Exercise:   Tobacco/ETS: Alcohol: per day  Sugar beverages: Sleep: Drug use: no    ROS:  GEN/ HEENT: No fever, significant weight changes sweats headaches vision problems hearing changes, CV/ PULM; No chest pain shortness of breath cough, syncope,edema  change in exercise tolerance. GI /GU: No adominal pain, vomiting, change in bowel habits. No blood in the stool. No significant GU symptoms. SKIN/HEME: ,no acute skin rashes suspicious lesions or bleeding. No lymphadenopathy, nodules, masses.  NEURO/ PSYCH:  No neurologic signs such as weakness numbness. No depression anxiety. IMM/ Allergy: No unusual infections.  Allergy .   REST of 12 system review negative except as per HPI   Past Medical History  Diagnosis Date  . ALLERGIC RHINITIS 09/14/2006  . Fibroid   . Seasonal allergies   . Hx of adenomatous colonic polyps 07/28/2015    Past Surgical History  Procedure Laterality Date  . Abdominal hysterectomy  1996  . Knee arthroscopy Right 2010  . Carpal tunnel release Right 2008  . Toe  surgery Bilateral   . Breast surgery Bilateral 1983, 1996    benign  . Cystectomy Bilateral 1983, 1988    breast; benign  . Total knee arthroplasty Right 2011  . Colonoscopy      Family History  Problem Relation Age of Onset  . Stroke Mother   . Diabetes Mother   . Diabetes Father   . Hypertension Sister   . Esophageal cancer Paternal Grandfather 39  . Colon cancer Neg Hx   . Stomach cancer Neg Hx   . Rectal cancer Neg Hx     Social History   Social History  . Marital Status: Single    Spouse Name: N/A  . Number of Children: N/A  . Years of Education: N/A   Social History Main Topics  . Smoking status: Current Every Day Smoker -- 0.25 packs/day    Types: Cigarettes  . Smokeless tobacco: Never Used  . Alcohol Use: 0.0 oz/week    0 Standard drinks or equivalent per week     Comment: Rare  . Drug Use: No  . Sexual Activity: Yes    Birth Control/ Protection: Surgical     Comment: HYST   Other Topics Concern  . 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     Outpatient Prescriptions Prior to Visit  Medication Sig Dispense Refill  . carboxymethylcellulose 1 % ophthalmic solution Apply 1 drop to eye daily.    . celecoxib (CELEBREX) 200  MG capsule Take 1 capsule (200 mg total) by mouth 2 (two) times daily. 30 capsule 0  . Cetirizine HCl (ZYRTEC ALLERGY PO) Take by mouth as needed. Reported on 12/14/2015    . Cholecalciferol (VITAMIN D3) 2000 UNITS capsule Take 2,000 Units by mouth daily.    Marland Kitchen Cod Liver Oil CAPS Take 1 capsule by mouth daily.    Marland Kitchen docusate sodium (COLACE) 100 MG capsule Take 100 mg by mouth 3 (three) times a week.     . fexofenadine (ALLEGRA) 180 MG tablet Take 180 mg by mouth daily. Reported on 12/14/2015    . GINKGO BILOBA PO Take 2 capsules by mouth daily.    Marland Kitchen loteprednol (LOTEMAX) 0.2 % SUSP Place 1 drop into both eyes daily.    . meclizine (ANTIVERT) 12.5 MG tablet  Take 1 tablet (12.5 mg total) by mouth 3 (three) times daily as needed for dizziness. 30 tablet 0  . Multiple Vitamin (MULTIVITAMIN) tablet Take 1 tablet by mouth daily.      . Olopatadine HCl (PATADAY) 0.2 % SOLN Apply 1 drop to eye daily.    . TURMERIC PO Take 2 tablets by mouth daily.     No facility-administered medications prior to visit.     EXAM:  BP 138/90 mmHg  Temp(Src) 98.2 F (36.8 C) (Oral)  Ht _0  (1.626 m)  Wt 192 lb 3.2 oz (87.181 kg)  BMI 32.97 kg/m2  Body mass index is 32.97 kg/(m^2).  Physical Exam: Vital signs reviewed YVO:PFYT is a well-developed well-nourished alert cooperative    who appearsr stated age in no acute distress.  HEENT: normocephalic atraumatic , Eyes: PERRL EOM's full, conjunctiva clear, Nares: paten,t no deformity discharge or tenderness., Ears: no deformity EAC's clear TMs with normal landmarks. Mouth: clear OP, no lesions, edema.  Moist mucous membranes. Dentition in adequate repair. NECK: supple without masses, thyromegaly or bruits. CHEST/PULM:  Clear to auscultation and percussion breath sounds equal no wheeze , rales or rhonchi. No chest wall deformities or tenderness.Breast: normal by inspection . No dimpling, discharge, masses, tenderness or discharge . CV: PMI is nondisplaced, S1 S2 no gallops, murmurs, rubs. Peripheral pulses are full without delay.No JVD .  ABDOMEN: Bowel sounds normal nontender  No guard or rebound, no hepato splenomegal no CVA tenderness.  No hernia. Extremtities:  No clubbing cyanosis or edema, no acute joint swelling or redness no focal atrophy NEURO:  Oriented x3, cranial nerves 3-12 appear to be intact, no obvious focal weakness,gait within normal limits no abnormal reflexes or asymmetrical SKIN: No acute rashes normal turgor, color, no bruising or petechiae. PSYCH: Oriented, good eye contact, no obvious depression anxiety, cognition and judgment appear normal. LN: no cervical axillary inguinal adenopathy  Lab  Results  Component Value Date   WBC 6.9 03/10/2015   HGB 13.8 03/10/2015   HCT 40.9 03/10/2015   PLT 419* 03/10/2015   GLUCOSE 94 03/10/2015   CHOL 145 03/10/2015   TRIG 101 03/10/2015   HDL 72 03/10/2015   LDLCALC 53 03/10/2015   ALT 15 03/10/2015   AST 18 03/10/2015   NA 143 03/10/2015   K 4.3 03/10/2015   CL 107 03/10/2015   CREATININE 0.88 03/10/2015   BUN 16 03/10/2015   CO2 28 03/10/2015   TSH 1.68 01/29/2014   INR 0.94 09/07/2010   HGBA1C 6.4* 01/31/2007   BP Readings from Last 3 Encounters:  03/11/16 138/90  03/10/16 130/80  12/14/15 140/90    ASSESSMENT AND PLAN:  Discussed the  following assessment and plan:  Visit for preventive health examination - Plan: Basic metabolic panel, CBC with Differential/Platelet, Lipid panel, TSH, Hepatic function panel, VITAMIN D 25 Hydroxy (Vit-D Deficiency, Fractures)  Hx of adenomatous colonic polyps - on q 5 year recall  Vitamin D deficiency - Plan: VITAMIN D 25 Hydroxy (Vit-D Deficiency, Fractures)  Elevated BP - recent caertaker for dying person will check and fu if elevated persiste  Allergic rhinitis, unspecified allergic rhinitis type  Vertigo - sounds bpvv intermittent sx  can fu ent expectant managment   Patient Care Team: Burnis Medin, MD as PCP - General (Internal Medicine) Huel Cote, NP as Nurse Practitioner (Obstetrics and Gynecology) Patient Instructions  Will notify you  of labs when available. Continue to try to be tobacco free .  BP goal is below 140/90 .     Suggest  Take blood pressure readings twice a day for 7- 10 days and then periodically .To ensure below 140/90   .    Health Maintenance, Female Adopting a healthy lifestyle and getting preventive care can go a long way to promote health and wellness. Talk with your health care provider about what schedule of regular examinations is right for you. This is a good chance for you to check in with your provider about disease prevention and  staying healthy. In between checkups, there are plenty of things you can do on your own. Experts have done a lot of research about which lifestyle changes and preventive measures are most likely to keep you healthy. Ask your health care provider for more information. WEIGHT AND DIET  Eat a healthy diet  Be sure to include plenty of vegetables, fruits, low-fat dairy products, and lean protein.  Do not eat a lot of foods high in solid fats, added sugars, or salt.  Get regular exercise. This is one of the most important things you can do for your health.  Most adults should exercise for at least 150 minutes each week. The exercise should increase your heart rate and make you sweat (moderate-intensity exercise).  Most adults should also do strengthening exercises at least twice a week. This is in addition to the moderate-intensity exercise.  Maintain a healthy weight  Body mass index (BMI) is a measurement that can be used to identify possible weight problems. It estimates body fat based on height and weight. Your health care provider can help determine your BMI and help you achieve or maintain a healthy weight.  For females 108 years of age and older:   A BMI below 18.5 is considered underweight.  A BMI of 18.5 to 24.9 is normal.  A BMI of 25 to 29.9 is considered overweight.  A BMI of 30 and above is considered obese.  Watch levels of cholesterol and blood lipids  You should start having your blood tested for lipids and cholesterol at 65 years of age, then have this test every 5 years.  You may need to have your cholesterol levels checked more often if:  Your lipid or cholesterol levels are high.  You are older than 65 years of age.  You are at high risk for heart disease.  CANCER SCREENING   Lung Cancer  Lung cancer screening is recommended for adults 30-39 years old who are at high risk for lung cancer because of a history of smoking.  A yearly low-dose CT scan of the  lungs is recommended for people who:  Currently smoke.  Have quit within the past 15  years.  Have at least a 30-pack-year history of smoking. A pack year is smoking an average of one pack of cigarettes a day for 1 year.  Yearly screening should continue until it has been 15 years since you quit.  Yearly screening should stop if you develop a health problem that would prevent you from having lung cancer treatment.  Breast Cancer  Practice breast self-awareness. This means understanding how your breasts normally appear and feel.  It also means doing regular breast self-exams. Let your health care provider know about any changes, no matter how small.  If you are in your 20s or 30s, you should have a clinical breast exam (CBE) by a health care provider every 1-3 years as part of a regular health exam.  If you are 10 or older, have a CBE every year. Also consider having a breast X-ray (mammogram) every year.  If you have a family history of breast cancer, talk to your health care provider about genetic screening.  If you are at high risk for breast cancer, talk to your health care provider about having an MRI and a mammogram every year.  Breast cancer gene (BRCA) assessment is recommended for women who have family members with BRCA-related cancers. BRCA-related cancers include:  Breast.  Ovarian.  Tubal.  Peritoneal cancers.  Results of the assessment will determine the need for genetic counseling and BRCA1 and BRCA2 testing. Cervical Cancer Your health care provider may recommend that you be screened regularly for cancer of the pelvic organs (ovaries, uterus, and vagina). This screening involves a pelvic examination, including checking for microscopic changes to the surface of your cervix (Pap test). You may be encouraged to have this screening done every 3 years, beginning at age 73.  For women ages 56-65, health care providers may recommend pelvic exams and Pap testing every 3  years, or they may recommend the Pap and pelvic exam, combined with testing for human papilloma virus (HPV), every 5 years. Some types of HPV increase your risk of cervical cancer. Testing for HPV may also be done on women of any age with unclear Pap test results.  Other health care providers may not recommend any screening for nonpregnant women who are considered low risk for pelvic cancer and who do not have symptoms. Ask your health care provider if a screening pelvic exam is right for you.  If you have had past treatment for cervical cancer or a condition that could lead to cancer, you need Pap tests and screening for cancer for at least 20 years after your treatment. If Pap tests have been discontinued, your risk factors (such as having a new sexual partner) need to be reassessed to determine if screening should resume. Some women have medical problems that increase the chance of getting cervical cancer. In these cases, your health care provider may recommend more frequent screening and Pap tests. Colorectal Cancer  This type of cancer can be detected and often prevented.  Routine colorectal cancer screening usually begins at 65 years of age and continues through 65 years of age.  Your health care provider may recommend screening at an earlier age if you have risk factors for colon cancer.  Your health care provider may also recommend using home test kits to check for hidden blood in the stool.  A small camera at the end of a tube can be used to examine your colon directly (sigmoidoscopy or colonoscopy). This is done to check for the earliest forms of colorectal cancer.  Routine screening usually begins at age 20.  Direct examination of the colon should be repeated every 5-10 years through 65 years of age. However, you may need to be screened more often if early forms of precancerous polyps or small growths are found. Skin Cancer  Check your skin from head to toe regularly.  Tell your  health care provider about any new moles or changes in moles, especially if there is a change in a mole's shape or color.  Also tell your health care provider if you have a mole that is larger than the size of a pencil eraser.  Always use sunscreen. Apply sunscreen liberally and repeatedly throughout the day.  Protect yourself by wearing long sleeves, pants, a wide-brimmed hat, and sunglasses whenever you are outside. HEART DISEASE, DIABETES, AND HIGH BLOOD PRESSURE   High blood pressure causes heart disease and increases the risk of stroke. High blood pressure is more likely to develop in:  People who have blood pressure in the high end of the normal range (130-139/85-89 mm Hg).  People who are overweight or obese.  People who are African American.  If you are 26-29 years of age, have your blood pressure checked every 3-5 years. If you are 10 years of age or older, have your blood pressure checked every year. You should have your blood pressure measured twice--once when you are at a hospital or clinic, and once when you are not at a hospital or clinic. Record the average of the two measurements. To check your blood pressure when you are not at a hospital or clinic, you can use:  An automated blood pressure machine at a pharmacy.  A home blood pressure monitor.  If you are between 22 years and 70 years old, ask your health care provider if you should take aspirin to prevent strokes.  Have regular diabetes screenings. This involves taking a blood sample to check your fasting blood sugar level.  If you are at a normal weight and have a low risk for diabetes, have this test once every three years after 65 years of age.  If you are overweight and have a high risk for diabetes, consider being tested at a younger age or more often. PREVENTING INFECTION  Hepatitis B  If you have a higher risk for hepatitis B, you should be screened for this virus. You are considered at high risk for  hepatitis B if:  You were born in a country where hepatitis B is common. Ask your health care provider which countries are considered high risk.  Your parents were born in a high-risk country, and you have not been immunized against hepatitis B (hepatitis B vaccine).  You have HIV or AIDS.  You use needles to inject street drugs.  You live with someone who has hepatitis B.  You have had sex with someone who has hepatitis B.  You get hemodialysis treatment.  You take certain medicines for conditions, including cancer, organ transplantation, and autoimmune conditions. Hepatitis C  Blood testing is recommended for:  Everyone born from [birth year] through 1965.  Anyone with known risk factors for hepatitis C. Sexually transmitted infections (STIs)  You should be screened for sexually transmitted infections (STIs) including gonorrhea and chlamydia if:  You are sexually active and are younger than 65 years of age.  You are older than 65 years of age and your health care provider tells you that you are at risk for this type of infection.  Your sexual activity has changed  since you were last screened and you are at an increased risk for chlamydia or gonorrhea. Ask your health care provider if you are at risk.  If you do not have HIV, but are at risk, it may be recommended that you take a prescription medicine daily to prevent HIV infection. This is called pre-exposure prophylaxis (PrEP). You are considered at risk if:  You are sexually active and do not regularly use condoms or know the HIV status of your partner(s).  You take drugs by injection.  You are sexually active with a partner who has HIV. Talk with your health care provider about whether you are at high risk of being infected with HIV. If you choose to begin PrEP, you should first be tested for HIV. You should then be tested every 3 months for as long as you are taking PrEP.  PREGNANCY   If you are premenopausal and you may  become pregnant, ask your health care provider about preconception counseling.  If you may become pregnant, take 400 to 800 micrograms (mcg) of folic acid every day.  If you want to prevent pregnancy, talk to your health care provider about birth control (contraception). OSTEOPOROSIS AND MENOPAUSE   Osteoporosis is a disease in which the bones lose minerals and strength with aging. This can result in serious bone fractures. Your risk for osteoporosis can be identified using a bone density scan.  If you are 72 years of age or older, or if you are at risk for osteoporosis and fractures, ask your health care provider if you should be screened.  Ask your health care provider whether you should take a calcium or vitamin D supplement to lower your risk for osteoporosis.  Menopause may have certain physical symptoms and risks.  Hormone replacement therapy may reduce some of these symptoms and risks. Talk to your health care provider about whether hormone replacement therapy is right for you.  HOME CARE INSTRUCTIONS   Schedule regular health, dental, and eye exams.  Stay current with your immunizations.   Do not use any tobacco products including cigarettes, chewing tobacco, or electronic cigarettes.  If you are pregnant, do not drink alcohol.  If you are breastfeeding, limit how much and how often you drink alcohol.  Limit alcohol intake to no more than 1 drink per day for nonpregnant women. One drink equals 12 ounces of beer, 5 ounces of wine, or 1 ounces of hard liquor.  Do not use street drugs.  Do not share needles.  Ask your health care provider for help if you need support or information about quitting drugs.  Tell your health care provider if you often feel depressed.  Tell your health care provider if you have ever been abused or do not feel safe at home.   This information is not intended to replace advice given to you by your health care provider. Make sure you discuss  any questions you have with your health care provider.   Document Released: 05/16/2011 Document Revised: 11/21/2014 Document Reviewed: 10/02/2013 Elsevier Interactive Patient Education 2016 [employer]   Benign Positional Vertigo Vertigo is the feeling that you or your surroundings are moving when they are not. Benign positional vertigo is the most common form of vertigo. The cause of this condition is not serious (is benign). This condition is triggered by certain movements and positions (is positional). This condition can be dangerous if it occurs while you are doing something that could endanger you or others, such as driving.  CAUSES In many cases, the cause of this condition is not known. It may be caused by a disturbance in an area of the inner ear that helps your brain to sense movement and balance. This disturbance can be caused by a viral infection (labyrinthitis), head injury, or repetitive motion. RISK FACTORS This condition is more likely to develop in:  Women.  People who are 74 years of age or older. SYMPTOMS Symptoms of this condition usually happen when you move your head or your eyes in different directions. Symptoms may start suddenly, and they usually last for less than a minute. Symptoms may include:  Loss of balance and falling.  Feeling like you are spinning or moving.  Feeling like your surroundings are spinning or moving.  Nausea and vomiting.  Blurred vision.  Dizziness.  Involuntary eye movement (nystagmus). Symptoms can be mild and cause only slight annoyance, or they can be severe and interfere with daily life. Episodes of benign positional vertigo may return (recur) over time, and they may be triggered by certain movements. Symptoms may improve over time. DIAGNOSIS This condition is usually diagnosed by medical history and a physical exam of the head, neck, and ears. You may be referred to a health care provider who specializes in ear, nose, and  throat (ENT) problems (otolaryngologist) or a provider who specializes in disorders of the nervous system (neurologist). You may have additional testing, including:  MRI.  A CT scan.  Eye movement tests. Your health care provider may ask you to change positions quickly while he or she watches you for symptoms of benign positional vertigo, such as nystagmus. Eye movement may be tested with an electronystagmogram (ENG), caloric stimulation, the Dix-Hallpike test, or the roll test.  An electroencephalogram (EEG). This records electrical activity in your brain.  Hearing tests. TREATMENT Usually, your health care provider will treat this by moving your head in specific positions to adjust your inner ear back to normal. Surgery may be needed in severe cases, but this is rare. In some cases, benign positional vertigo may resolve on its own in 2-4 weeks. HOME CARE INSTRUCTIONS Safety  Move slowly.Avoid sudden body or head movements.  Avoid driving.  Avoid operating heavy machinery.  Avoid doing any tasks that would be dangerous to you or others if a vertigo episode would occur.  If you have trouble walking or keeping your balance, try using a cane for stability. If you feel dizzy or unstable, sit down right away.  Return to your normal activities as told by your health care provider. Ask your health care provider what activities are safe for you. General Instructions  Take over-the-counter and prescription medicines only as told by your health care provider.  Avoid certain positions or movements as told by your health care provider.  Drink enough fluid to keep your urine clear or pale yellow.  Keep all follow-up visits as told by your health care provider. This is important. SEEK MEDICAL CARE IF:  You have a fever.  Your condition gets worse or you develop new symptoms.  Your family or friends notice any behavioral changes.  Your nausea or vomiting gets worse.  You have  numbness or a "pins and needles" sensation. SEEK IMMEDIATE MEDICAL CARE IF:  You have difficulty speaking or moving.  You are always dizzy.  You faint.  You develop severe headaches.  You have weakness in your legs or arms.  You have changes in your hearing or vision.  You develop a stiff neck.  You develop sensitivity to light.   This information is not intended to replace advice given to you by your health care provider. Make sure you discuss any questions you have with your health care provider.   Document Released: 08/08/2006 Document Revised: 07/22/2015 Document Reviewed: 02/23/2015 Elsevier Interactive Patient Education 2016 [place]. Gwyndolyn Guilford M.D.

## 2016-03-10 NOTE — Patient Instructions (Signed)
Basic Carbohydrate Counting for Diabetes Mellitus Carbohydrate counting is a method for keeping track of the amount of carbohydrates you eat. Eating carbohydrates naturally increases the level of sugar (glucose) in your blood, so it is important for you to know the amount that is okay for you to have in every meal. Carbohydrate counting helps keep the level of glucose in your blood within normal limits. The amount of carbohydrates allowed is different for every person. A dietitian can help you calculate the amount that is right for you. Once you know the amount of carbohydrates you can have, you can count the carbohydrates in the foods you want to eat. Carbohydrates are found in the following foods:  Grains, such as breads and cereals.  Dried beans and soy products.  Starchy vegetables, such as potatoes, peas, and corn.  Fruit and fruit juices.  Milk and yogurt.  Sweets and snack foods, such as cake, cookies, candy, chips, soft drinks, and fruit drinks. CARBOHYDRATE COUNTING There are two ways to count the carbohydrates in your food. You can use either of the methods or a combination of both. Reading the "Nutrition Facts" on [place] The "Nutrition Facts" is an area that is included on the labels of almost all packaged food and beverages in the [place]. It includes the serving size of that food or beverage and information about the nutrients in each serving of the food, including the grams (g) of carbohydrate per serving.  Decide the number of servings of this food or beverage that you will be able to eat or drink. Multiply that number of servings by the number of grams of carbohydrate that is listed on the label for that serving. The total will be the amount of carbohydrates you will be having when you eat or drink this food or beverage. Learning Standard Serving Sizes of Food When you eat food that is not packaged or does not include "Nutrition Facts" on the label, you need to  measure the servings in order to count the amount of carbohydrates.A serving of most carbohydrate-rich foods contains about 15 g of carbohydrates. The following list includes serving sizes of carbohydrate-rich foods that provide 15 g ofcarbohydrate per serving:   1 slice of bread (1 oz) or 1 six-inch tortilla.    of a hamburger bun or English muffin.  4-6 crackers.   cup unsweetened dry cereal.    cup hot cereal.   cup rice or pasta.    cup mashed potatoes or  of a large baked potato.  1 cup fresh fruit or one small piece of fruit.    cup canned or frozen fruit or fruit juice.  1 cup milk.   cup plain fat-free yogurt or yogurt sweetened with artificial sweeteners.   cup cooked dried beans or starchy vegetable, such as peas, corn, or potatoes.  Decide the number of standard-size servings that you will eat. Multiply that number of servings by 15 (the grams of carbohydrates in that serving). For example, if you eat 2 cups of strawberries, you will have eaten 2 servings and 30 g of carbohydrates (2 servings x 15 g = 30 g). For foods such as soups and casseroles, in which more than one food is mixed in, you will need to count the carbohydrates in each food that is included. EXAMPLE OF CARBOHYDRATE COUNTING Sample Dinner  3 oz chicken breast.   cup of brown rice.   cup of corn.  1 cup milk.   1 cup strawberries with  sugar-free whipped topping.  Carbohydrate Calculation Step 1: Identify the foods that contain carbohydrates:   Rice.   Corn.   Milk.   Strawberries. Step 2:Calculate the number of servings eaten of each:   2 servings of rice.   1 serving of corn.   1 serving of milk.   1 serving of strawberries. Step 3: Multiply each of those number of servings by 15 g:   2 servings of rice x 15 g = 30 g.   1 serving of corn x 15 g = 15 g.   1 serving of milk x 15 g = 15 g.   1 serving of strawberries x 15 g = 15 g. Step 4: Add  together all of the amounts to find the total grams of carbohydrates eaten: 30 g + 15 g + 15 g + 15 g = 75 g.   This information is not intended to replace advice given to you by your health care provider. Make sure you discuss any questions you have with your health care provider.   Document Released: 10/31/2005 Document Revised: 11/21/2014 Document Reviewed: 09/27/2013 Elsevier Interactive Patient Education [employer]. Menopause is a normal process in which your reproductive ability comes to an end. This process happens gradually over a span of months to years, usually between the ages of 64 and 37. Menopause is complete when you have missed 12 consecutive menstrual periods. It is important to talk with your health care provider about some of the most common conditions that affect postmenopausal women, such as heart disease, cancer, and bone loss (osteoporosis). Adopting a healthy lifestyle and getting preventive care can help to promote your health and wellness. Those actions can also lower your chances of developing some of these common conditions. WHAT SHOULD I KNOW ABOUT MENOPAUSE? During menopause, you may experience a number of symptoms, such as:  Moderate-to-severe hot flashes.  Night sweats.  Decrease in sex drive.  Mood swings.  Headaches.  Tiredness.  Irritability.  Memory problems.  Insomnia. Choosing to treat or not to treat menopausal changes is an individual decision that you make with your health care provider. WHAT SHOULD I KNOW ABOUT HORMONE REPLACEMENT THERAPY AND SUPPLEMENTS? Hormone therapy products are effective for treating symptoms that are associated with menopause, such as hot flashes and night sweats. Hormone replacement carries certain risks, especially as you become older. If you are thinking about using estrogen or estrogen with progestin treatments, discuss the benefits and risks with your health care provider. WHAT SHOULD I KNOW ABOUT HEART  DISEASE AND STROKE? Heart disease, heart attack, and stroke become more likely as you age. This may be due, in part, to the hormonal changes that your body experiences during menopause. These can affect how your body processes dietary fats, triglycerides, and cholesterol. Heart attack and stroke are both medical emergencies. There are many things that you can do to help prevent heart disease and stroke:  Have your blood pressure checked at least every 1-2 years. High blood pressure causes heart disease and increases the risk of stroke.  If you are 18-23 years old, ask your health care provider if you should take aspirin to prevent a heart attack or a stroke.  Do not use any tobacco products, including cigarettes, chewing tobacco, or electronic cigarettes. If you need help quitting, ask your health care provider.  It is important to eat a healthy diet and maintain a healthy weight.  Be sure to include plenty of vegetables, fruits, low-fat dairy products,  and lean protein.  Avoid eating foods that are high in solid fats, added sugars, or salt (sodium).  Get regular exercise. This is one of the most important things that you can do for your health.  Try to exercise for at least 150 minutes each week. The type of exercise that you do should increase your heart rate and make you sweat. This is known as moderate-intensity exercise.  Try to do strengthening exercises at least twice each week. Do these in addition to the moderate-intensity exercise.  Know your numbers.Ask your health care provider to check your cholesterol and your blood glucose. Continue to have your blood tested as directed by your health care provider. WHAT SHOULD I KNOW ABOUT CANCER SCREENING? There are several types of cancer. Take the following steps to reduce your risk and to catch any cancer development as early as possible. Breast Cancer  Practice breast self-awareness.  This means understanding how your breasts  normally appear and feel.  It also means doing regular breast self-exams. Let your health care provider know about any changes, no matter how small.  If you are 6 or older, have a clinician do a breast exam (clinical breast exam or CBE) every year. Depending on your age, family history, and medical history, it may be recommended that you also have a yearly breast X-ray (mammogram).  If you have a family history of breast cancer, talk with your health care provider about genetic screening.  If you are at high risk for breast cancer, talk with your health care provider about having an MRI and a mammogram every year.  Breast cancer (BRCA) gene test is recommended for women who have family members with BRCA-related cancers. Results of the assessment will determine the need for genetic counseling and BRCA1 and for BRCA2 testing. BRCA-related cancers include these types:  Breast. This occurs in males or females.  Ovarian.  Tubal. This may also be called fallopian tube cancer.  Cancer of the abdominal or pelvic lining (peritoneal cancer).  Prostate.  Pancreatic. Cervical, Uterine, and Ovarian Cancer Your health care provider may recommend that you be screened regularly for cancer of the pelvic organs. These include your ovaries, uterus, and vagina. This screening involves a pelvic exam, which includes checking for microscopic changes to the surface of your cervix (Pap test).  For women ages 21-65, health care providers may recommend a pelvic exam and a Pap test every three years. For women ages 23-65, they may recommend the Pap test and pelvic exam, combined with testing for human papilloma virus (HPV), every five years. Some types of HPV increase your risk of cervical cancer. Testing for HPV may also be done on women of any age who have unclear Pap test results.  Other health care providers may not recommend any screening for nonpregnant women who are considered low risk for pelvic cancer and  have no symptoms. Ask your health care provider if a screening pelvic exam is right for you.  If you have had past treatment for cervical cancer or a condition that could lead to cancer, you need Pap tests and screening for cancer for at least 20 years after your treatment. If Pap tests have been discontinued for you, your risk factors (such as having a new sexual partner) need to be reassessed to determine if you should start having screenings again. Some women have medical problems that increase the chance of getting cervical cancer. In these cases, your health care provider may recommend that you have screening  and Pap tests more often.  If you have a family history of uterine cancer or ovarian cancer, talk with your health care provider about genetic screening.  If you have vaginal bleeding after reaching menopause, tell your health care provider.  There are currently no reliable tests available to screen for ovarian cancer. Lung Cancer Lung cancer screening is recommended for adults 16-68 years old who are at high risk for lung cancer because of a history of smoking. A yearly low-dose CT scan of the lungs is recommended if you:  Currently smoke.  Have a history of at least 30 pack-years of smoking and you currently smoke or have quit within the past 15 years. A pack-year is smoking an average of one pack of cigarettes per day for one year. Yearly screening should:  Continue until it has been 15 years since you quit.  Stop if you develop a health problem that would prevent you from having lung cancer treatment. Colorectal Cancer  This type of cancer can be detected and can often be prevented.  Routine colorectal cancer screening usually begins at age 39 and continues through age 67.  If you have risk factors for colon cancer, your health care provider may recommend that you be screened at an earlier age.  If you have a family history of colorectal cancer, talk with your health care  provider about genetic screening.  Your health care provider may also recommend using home test kits to check for hidden blood in your stool.  A small camera at the end of a tube can be used to examine your colon directly (sigmoidoscopy or colonoscopy). This is done to check for the earliest forms of colorectal cancer.  Direct examination of the colon should be repeated every 5-10 years until age 25. However, if early forms of precancerous polyps or small growths are found or if you have a family history or genetic risk for colorectal cancer, you may need to be screened more often. Skin Cancer  Check your skin from head to toe regularly.  Monitor any moles. Be sure to tell your health care provider:  About any new moles or changes in moles, especially if there is a change in a mole's shape or color.  If you have a mole that is larger than the size of a pencil eraser.  If any of your family members has a history of skin cancer, especially at a young age, talk with your health care provider about genetic screening.  Always use sunscreen. Apply sunscreen liberally and repeatedly throughout the day.  Whenever you are outside, protect yourself by wearing long sleeves, pants, a wide-brimmed hat, and sunglasses. WHAT SHOULD I KNOW ABOUT OSTEOPOROSIS? Osteoporosis is a condition in which bone destruction happens more quickly than new bone creation. After menopause, you may be at an increased risk for osteoporosis. To help prevent osteoporosis or the bone fractures that can happen because of osteoporosis, the following is recommended:  If you are 82-28 years old, get at least 1,000 mg of calcium and at least 600 mg of vitamin D per day.  If you are older than age 4 but younger than age 64, get at least 1,200 mg of calcium and at least 600 mg of vitamin D per day.  If you are older than age 73, get at least 1,200 mg of calcium and at least 800 mg of vitamin D per day. Smoking and excessive  alcohol intake increase the risk of osteoporosis. Eat foods that are rich  in calcium and vitamin D, and do weight-bearing exercises several times each week as directed by your health care provider. WHAT SHOULD I KNOW ABOUT HOW MENOPAUSE AFFECTS [place]? Depression may occur at any age, but it is more common as you become older. Common symptoms of depression include:  Low or sad mood.  Changes in sleep patterns.  Changes in appetite or eating patterns.  Feeling an overall lack of motivation or enjoyment of activities that you previously enjoyed.  Frequent crying spells. Talk with your health care provider if you think that you are experiencing depression. WHAT SHOULD I KNOW ABOUT IMMUNIZATIONS? It is important that you get and maintain your immunizations. These include:  Tetanus, diphtheria, and pertussis (Tdap) booster vaccine.  Influenza every year before the flu season begins.  Pneumonia vaccine.  Shingles vaccine. Your health care provider may also recommend other immunizations.   This information is not intended to replace advice given to you by your health care provider. Make sure you discuss any questions you have with your health care provider.   Document Released: 12/23/2005 Document Revised: 11/21/2014 Document Reviewed: 07/03/2014 Elsevier Interactive Patient Education [employer].

## 2016-03-11 ENCOUNTER — Ambulatory Visit (INDEPENDENT_AMBULATORY_CARE_PROVIDER_SITE_OTHER): Payer: BLUE CROSS/BLUE SHIELD | Admitting: Internal Medicine

## 2016-03-11 ENCOUNTER — Encounter: Payer: Self-pay | Admitting: Internal Medicine

## 2016-03-11 VITALS — BP 138/90 | Temp 98.2°F | Ht 64.0 in | Wt 192.2 lb

## 2016-03-11 DIAGNOSIS — Z0001 Encounter for general adult medical examination with abnormal findings: Secondary | ICD-10-CM

## 2016-03-11 DIAGNOSIS — Z86010 Personal history of colonic polyps: Secondary | ICD-10-CM

## 2016-03-11 DIAGNOSIS — R030 Elevated blood-pressure reading, without diagnosis of hypertension: Secondary | ICD-10-CM | POA: Diagnosis not present

## 2016-03-11 DIAGNOSIS — E559 Vitamin D deficiency, unspecified: Secondary | ICD-10-CM | POA: Diagnosis not present

## 2016-03-11 DIAGNOSIS — R42 Dizziness and giddiness: Secondary | ICD-10-CM

## 2016-03-11 DIAGNOSIS — Z0000 Encounter for general adult medical examination without abnormal findings: Secondary | ICD-10-CM | POA: Diagnosis not present

## 2016-03-11 DIAGNOSIS — IMO0001 Reserved for inherently not codable concepts without codable children: Secondary | ICD-10-CM

## 2016-03-11 DIAGNOSIS — J309 Allergic rhinitis, unspecified: Secondary | ICD-10-CM

## 2016-03-11 LAB — CBC WITH DIFFERENTIAL/PLATELET
BASOS PCT: 0.3 % (ref 0.0–3.0)
Basophils Absolute: 0 10*3/uL (ref 0.0–0.1)
EOS PCT: 5 % (ref 0.0–5.0)
Eosinophils Absolute: 0.3 10*3/uL (ref 0.0–0.7)
HCT: 42.2 % (ref 36.0–46.0)
HEMOGLOBIN: 14.3 g/dL (ref 12.0–15.0)
Lymphocytes Relative: 34.4 % (ref 12.0–46.0)
Lymphs Abs: 2.4 10*3/uL (ref 0.7–4.0)
MCHC: 33.8 g/dL (ref 30.0–36.0)
MCV: 92.5 fl (ref 78.0–100.0)
MONOS PCT: 7.8 % (ref 3.0–12.0)
Monocytes Absolute: 0.5 10*3/uL (ref 0.1–1.0)
Neutro Abs: 3.7 10*3/uL (ref 1.4–7.7)
Neutrophils Relative %: 52.5 % (ref 43.0–77.0)
Platelets: 303 10*3/uL (ref 150.0–400.0)
RBC: 4.56 Mil/uL (ref 3.87–5.11)
RDW: 14.1 % (ref 11.5–15.5)
WBC: 7 10*3/uL (ref 4.0–10.5)

## 2016-03-11 LAB — BASIC METABOLIC PANEL
BUN: 15 mg/dL (ref 6–23)
CHLORIDE: 110 meq/L (ref 96–112)
CO2: 27 mEq/L (ref 19–32)
Calcium: 9.7 mg/dL (ref 8.4–10.5)
Creatinine, Ser: 0.75 mg/dL (ref 0.40–1.20)
GFR: 99.91 mL/min (ref >60.00)
GLUCOSE: 107 mg/dL — AB (ref 70–99)
POTASSIUM: 3.8 meq/L (ref 3.5–5.1)
SODIUM: 143 meq/L (ref 135–145)

## 2016-03-11 LAB — HEPATIC FUNCTION PANEL
ALT: 18 U/L (ref 0–35)
AST: 19 U/L (ref 0–37)
Albumin: 4 g/dL (ref 3.5–5.2)
Alkaline Phosphatase: 73 U/L (ref 39–117)
Bilirubin, Direct: 0 mg/dL (ref 0.0–0.3)
Total Bilirubin: 0.2 mg/dL (ref 0.2–1.2)
Total Protein: 6.6 g/dL (ref 6.0–8.3)

## 2016-03-11 LAB — URINALYSIS W MICROSCOPIC + REFLEX CULTURE
Bacteria, UA: NONE SEEN [HPF]
Bilirubin Urine: NEGATIVE
CASTS: NONE SEEN [LPF]
CRYSTALS: NONE SEEN [HPF]
Glucose, UA: NEGATIVE
HGB URINE DIPSTICK: NEGATIVE
KETONES UR: NEGATIVE
Leukocytes, UA: NEGATIVE
NITRITE: NEGATIVE
PH: 5.5 (ref 5.0–8.0)
Protein, ur: NEGATIVE
SPECIFIC GRAVITY, URINE: 1.023 (ref 1.001–1.035)
Squamous Epithelial / LPF: NONE SEEN [HPF] (ref <=5)
YEAST: NONE SEEN [HPF]

## 2016-03-11 LAB — LIPID PANEL
CHOL/HDL RATIO: 2
Cholesterol: 161 mg/dL (ref 0–200)
HDL: 69.6 mg/dL (ref >39.00)
LDL CALC: 77 mg/dL (ref 0–99)
NONHDL: 90.99
Triglycerides: 70 mg/dL (ref 0.0–149.0)
VLDL: 14 mg/dL (ref 0.0–40.0)

## 2016-03-11 LAB — TSH: TSH: 1.43 u[IU]/mL (ref 0.35–4.50)

## 2016-03-11 LAB — VITAMIN D 25 HYDROXY (VIT D DEFICIENCY, FRACTURES): VITD: 22.44 ng/mL — ABNORMAL LOW (ref 30.00–100.00)

## 2016-03-14 LAB — URINE CULTURE: Colony Count: 80000

## 2016-03-17 ENCOUNTER — Other Ambulatory Visit: Payer: Self-pay | Admitting: Gynecology

## 2016-03-17 MED ORDER — SULFAMETHOXAZOLE-TRIMETHOPRIM 800-160 MG PO TABS: 1.0000 | ORAL_TABLET | Freq: Two times a day (BID) | ORAL | Status: DC

## 2016-03-22 ENCOUNTER — Telehealth: Payer: Self-pay | Admitting: Family Medicine

## 2016-03-22 MED ORDER — VITAMIN D (ERGOCALCIFEROL) 1.25 MG (50000 UNIT) PO CAPS: 50000.0000 [IU] | ORAL_CAPSULE | ORAL | Status: DC

## 2016-03-22 NOTE — Telephone Encounter (Signed)
Spoke to the pt and informed her that I sent in rx.  Directed her to take 1 weekly for 8 weeks.  Not to take her regular vitamin d during that time.  Start regular (3000 iu daily) after completion of prescription. Pt agrees with plan and will pick up new rx.

## 2016-03-22 NOTE — Telephone Encounter (Signed)
-----   Message from Burnis Medin, MD sent at 03/21/2016  4:55 PM EDT ----- Level i s22 and not that low    Can rx  Instead 50000 iu once per week for 8 weeksdisp 8 no refills  and then go back down to 3000Iu per day   Please document this in the record   Not staff message as wont be in patient record.   WP ----- Message -----    From: Hulda Humphrey, CMA    Sent: 03/21/2016   3:13 PM      To: Burnis Medin, MD  Pt informed of lab results.  She is concerned about her vitamin d.  She is taking 2000 ui of VitD3 and a multiple vitamin that has 1000 iu.  3000 iu total daily.  Does she need to do anything different?

## 2016-04-06 ENCOUNTER — Ambulatory Visit
Admission: RE | Admit: 2016-04-06 | Discharge: 2016-04-06 | Disposition: A | Discharge status: Home / Self Care | Payer: BLUE CROSS/BLUE SHIELD | Source: Ambulatory Visit

## 2016-04-06 DIAGNOSIS — Z1231 Encounter for screening mammogram for malignant neoplasm of breast: Secondary | ICD-10-CM

## 2016-04-14 ENCOUNTER — Telehealth: Payer: Self-pay | Admitting: Family Medicine

## 2016-04-14 NOTE — Telephone Encounter (Signed)
-----   Message from Burnis Medin, MD sent at 04/14/2016  4:25 AM EDT ----- Please make this into a phone note for the record. WP ----- Message -----    From: Hulda Humphrey, CMA    Sent: 03/21/2016   3:13 PM      To: Burnis Medin, MD  Pt informed of lab results.  She is concerned about her vitamin d.  She is taking 2000 ui of VitD3 and a multiple vitamin that has 1000 iu.  3000 iu total daily.  Does she need to do anything different?

## 2016-04-15 NOTE — Telephone Encounter (Signed)
She could go up to 4000 iu per day   (Would not go over 5000 iu per day)

## 2016-08-15 ENCOUNTER — Ambulatory Visit (INDEPENDENT_AMBULATORY_CARE_PROVIDER_SITE_OTHER): Payer: BLUE CROSS/BLUE SHIELD | Admitting: Gynecology

## 2016-08-15 ENCOUNTER — Encounter: Payer: Self-pay | Admitting: Gynecology

## 2016-08-15 VITALS — BP 138/76

## 2016-08-15 DIAGNOSIS — B9689 Other specified bacterial agents as the cause of diseases classified elsewhere: Secondary | ICD-10-CM

## 2016-08-15 DIAGNOSIS — N898 Other specified noninflammatory disorders of vagina: Secondary | ICD-10-CM

## 2016-08-15 DIAGNOSIS — Z113 Encounter for screening for infections with a predominantly sexual mode of transmission: Secondary | ICD-10-CM

## 2016-08-15 DIAGNOSIS — N760 Acute vaginitis: Secondary | ICD-10-CM

## 2016-08-15 LAB — WET PREP FOR TRICH, YEAST, CLUE
Trich, Wet Prep: NONE SEEN
WBC, Wet Prep HPF POC: NONE SEEN
Yeast Wet Prep HPF POC: NONE SEEN

## 2016-08-15 MED ORDER — METRONIDAZOLE 500 MG PO TABS: 500.0000 mg | ORAL_TABLET | Freq: Two times a day (BID) | ORAL | 0 refills | Status: DC

## 2016-08-15 MED ORDER — FLUCONAZOLE 150 MG PO TABS: 150.0000 mg | ORAL_TABLET | Freq: Once | ORAL | 0 refills | Status: AC

## 2016-08-15 NOTE — Progress Notes (Signed)
   Patient is a 65 year old that presented to the office today complaining of a vaginal discharge with odor. Patient with history of enlarged left parotid gland and is in the process of having surgery in the next few weeks. She has been placed on cephalexin and she believes she Collene Mares developed a yeast infection as a result of that. Patient denies any dysuria, frequency, fever, chills, nausea, vomiting or any back pain. Patient with past history of total abdominal hysterectomy. Patient with no change in sexual partners.  Pelvic exam: Bartholin urethra Skene was within normal limits Vagina: No gross lesions or discharge noted vaginal cuff intact Bimanual exam not done Rectal exam: Not done  Wet prep few clue cells and too numerous to count bacteria  GC Chlamydia culture obtained today result pending at time of this dictation  Assessment/plan: Patient with clinical evidence of bacterial vaginosis will be treated with Flagyl 500 mg twice a day for 7 days. I've also given her a prescription for Diflucan 150 mg to take in the near future she begins having any vulvar pruritus especially near the time of her surgery since she is on antibiotic.

## 2016-08-15 NOTE — Patient Instructions (Signed)
Fluconazole tablets What is this medicine? FLUCONAZOLE (floo KON na zole) is an antifungal medicine. It is used to treat certain kinds of fungal or yeast infections. This medicine may be used for other purposes; ask your health care provider or pharmacist if you have questions. What should I tell my health care provider before I take this medicine? They need to know if you have any of these conditions: -electrolyte abnormalities -history of irregular heart beat -kidney disease -an unusual or allergic reaction to fluconazole, other azole antifungals, medicines, foods, dyes, or preservatives -pregnant or trying to get pregnant -breast-feeding How should I use this medicine? Take this medicine by mouth. Follow the directions on the prescription label. Do not take your medicine more often than directed. Talk to your pediatrician regarding the use of this medicine in children. Special care may be needed. This medicine has been used in children as young as 58 months of age. Overdosage: If you think you have taken too much of this medicine contact a poison control center or emergency room at once. NOTE: This medicine is only for you. Do not share this medicine with others. What if I miss a dose? If you miss a dose, take it as soon as you can. If it is almost time for your next dose, take only that dose. Do not take double or extra doses. What may interact with this medicine? Do not take this medicine with any of the following medications: -astemizole -certain medicines for irregular heart beat like dofetilide, dronedarone, quinidine -cisapride -erythromycin -lomitapide -other medicines that prolong the QT interval (cause an abnormal heart rhythm) -pimozide -terfenadine -thioridazine -tolvaptan -ziprasidone This medicine may also interact with the following medications: -antiviral medicines for HIV or AIDS -birth control pills -certain antibiotics like rifabutin, rifampin -certain  medicines for blood pressure like amlodipine, isradipine, felodipine, hydrochlorothiazide, losartan, nifedipine -certain medicines for cancer like cyclophosphamide, vinblastine, vincristine -certain medicines for cholesterol like atorvastatin, lovastatin, fluvastatin, simvastatin -certain medicines for depression, anxiety, or psychotic disturbances like amitriptyline, midazolam, nortriptyline, triazolam -certain medicines for diabetes like glipizide, glyburide, tolbutamide -certain medicines for pain like alfentanil, fentanyl, methadone -certain medicines for seizures like carbamazepine, phenytoin -certain medicines that treat or prevent blood clots like warfarin -halofantrine -medicines that lower your chance of fighting infection like cyclosporine, prednisone, tacrolimus -NSAIDS, medicines for pain and inflammation, like celecoxib, diclofenac, flurbiprofen, ibuprofen, meloxicam, naproxen -other medicines for fungal infections -sirolimus -theophylline -tofacitinib This list may not describe all possible interactions. Give your health care provider a list of all the medicines, herbs, non-prescription drugs, or dietary supplements you use. Also tell them if you smoke, drink alcohol, or use illegal drugs. Some items may interact with your medicine. What should I watch for while using this medicine? Visit your doctor or health care professional for regular checkups. If you are taking this medicine for a long time you may need blood work. Tell your doctor if your symptoms do not improve. Some fungal infections need many weeks or months of treatment to cure. Alcohol can increase possible damage to your liver. Avoid alcoholic drinks. If you have a vaginal infection, do not have sex until you have finished your treatment. You can wear a sanitary napkin. Do not use tampons. Wear freshly washed cotton, not synthetic, panties. What side effects may I notice from receiving this medicine? Side effects that  you should report to your doctor or health care professional as soon as possible: -allergic reactions like skin rash or itching, hives, swelling of the lips, mouth,  tongue, or throat -dark urine -feeling dizzy or faint -irregular heartbeat or chest pain -redness, blistering, peeling or loosening of the skin, including inside the mouth -trouble breathing -unusual bruising or bleeding -vomiting -yellowing of the eyes or skin Side effects that usually do not require medical attention (report to your doctor or health care professional if they continue or are bothersome): -changes in how food tastes -diarrhea -headache -stomach upset or nausea This list may not describe all possible side effects. Call your doctor for medical advice about side effects. You may report side effects to FDA at 1-800-FDA-1088. Where should I keep my medicine? Keep out of the reach of children. Store at room temperature below 30 degrees C (86 degrees F). Throw away any medicine after the expiration date. NOTE: This sheet is a summary. It may not cover all possible information. If you have questions about this medicine, talk to your doctor, pharmacist, or health care provider.    2016, Elsevier/Gold Standard. (2013-06-08 16:13:04) Metronidazole tablets or capsules What is this medicine? METRONIDAZOLE (me troe NI da zole) is an antiinfective. It is used to treat certain kinds of bacterial and protozoal infections. It will not work for colds, flu, or other viral infections. This medicine may be used for other purposes; ask your health care provider or pharmacist if you have questions. What should I tell my health care provider before I take this medicine? They need to know if you have any of these conditions: -anemia or other blood disorders -disease of the nervous system -fungal or yeast infection -if you drink alcohol containing drinks -liver disease -seizures -an unusual or allergic reaction to metronidazole,  or other medicines, foods, dyes, or preservatives -pregnant or trying to get pregnant -breast-feeding How should I use this medicine? Take this medicine by mouth with a full glass of water. Follow the directions on the prescription label. Take your medicine at regular intervals. Do not take your medicine more often than directed. Take all of your medicine as directed even if you think you are better. Do not skip doses or stop your medicine early. Talk to your pediatrician regarding the use of this medicine in children. Special care may be needed. Overdosage: If you think you have taken too much of this medicine contact a poison control center or emergency room at once. NOTE: This medicine is only for you. Do not share this medicine with others. What if I miss a dose? If you miss a dose, take it as soon as you can. If it is almost time for your next dose, take only that dose. Do not take double or extra doses. What may interact with this medicine? Do not take this medicine with any of the following medications: -alcohol or any product that contains alcohol -amprenavir oral solution -cisapride -disulfiram -dofetilide -dronedarone -paclitaxel injection -pimozide -ritonavir oral solution -sertraline oral solution -sulfamethoxazole-trimethoprim injection -thioridazine -ziprasidone This medicine may also interact with the following medications: -birth control pills -cimetidine -lithium -other medicines that prolong the QT interval (cause an abnormal heart rhythm) -phenobarbital -phenytoin -warfarin This list may not describe all possible interactions. Give your health care provider a list of all the medicines, herbs, non-prescription drugs, or dietary supplements you use. Also tell them if you smoke, drink alcohol, or use illegal drugs. Some items may interact with your medicine. What should I watch for while using this medicine? Tell your doctor or health care professional if your  symptoms do not improve or if they get worse. You may get  drowsy or dizzy. Do not drive, use machinery, or do anything that needs mental alertness until you know how this medicine affects you. Do not stand or sit up quickly, especially if you are an older patient. This reduces the risk of dizzy or fainting spells. Avoid alcoholic drinks while you are taking this medicine and for three days afterward. Alcohol may make you feel dizzy, sick, or flushed. If you are being treated for a sexually transmitted disease, avoid sexual contact until you have finished your treatment. Your sexual partner may also need treatment. What side effects may I notice from receiving this medicine? Side effects that you should report to your doctor or health care professional as soon as possible: -allergic reactions like skin rash or hives, swelling of the face, lips, or tongue -confusion, clumsiness -difficulty speaking -discolored or sore mouth -dizziness -fever, infection -numbness, tingling, pain or weakness in the hands or feet -trouble passing urine or change in the amount of urine -redness, blistering, peeling or loosening of the skin, including inside the mouth -seizures -unusually weak or tired -vaginal irritation, dryness, or discharge Side effects that usually do not require medical attention (report to your doctor or health care professional if they continue or are bothersome): -diarrhea -headache -irritability -metallic taste -nausea -stomach pain or cramps -trouble sleeping This list may not describe all possible side effects. Call your doctor for medical advice about side effects. You may report side effects to FDA at 1-800-FDA-1088. Where should I keep my medicine? Keep out of the reach of children. Store at room temperature below 25 degrees C (77 degrees F). Protect from light. Keep container tightly closed. Throw away any unused medicine after the expiration date. NOTE: This sheet is a  summary. It may not cover all possible information. If you have questions about this medicine, talk to your doctor, pharmacist, or health care provider.    2016, Elsevier/Gold Standard. (2013-06-07 14:08:39) Bacterial Vaginosis Bacterial vaginosis is a vaginal infection that occurs when the normal balance of bacteria in the vagina is disrupted. It results from an overgrowth of certain bacteria. This is the most common vaginal infection in women of childbearing age. Treatment is important to prevent complications, especially in pregnant women, as it can cause a premature delivery. CAUSES  Bacterial vaginosis is caused by an increase in harmful bacteria that are normally present in smaller amounts in the vagina. Several different kinds of bacteria can cause bacterial vaginosis. However, the reason that the condition develops is not fully understood. RISK FACTORS Certain activities or behaviors can put you at an increased risk of developing bacterial vaginosis, including:  Having a new sex partner or multiple sex partners.  Douching.  Using an intrauterine device (IUD) for contraception. Women do not get bacterial vaginosis from toilet seats, bedding, swimming pools, or contact with objects around them. SIGNS AND SYMPTOMS  Some women with bacterial vaginosis have no signs or symptoms. Common symptoms include:  Grey vaginal discharge.  A fishlike odor with discharge, especially after sexual intercourse.  Itching or burning of the vagina and vulva.  Burning or pain with urination. DIAGNOSIS  Your health care provider will take a medical history and examine the vagina for signs of bacterial vaginosis. A sample of vaginal fluid may be taken. Your health care provider will look at this sample under a microscope to check for bacteria and abnormal cells. A vaginal pH test may also be done.  TREATMENT  Bacterial vaginosis may be treated with antibiotic medicines. These may be  given in the form of  a pill or a vaginal cream. A second round of antibiotics may be prescribed if the condition comes back after treatment. Because bacterial vaginosis increases your risk for sexually transmitted diseases, getting treated can help reduce your risk for chlamydia, gonorrhea, HIV, and herpes. HOME CARE INSTRUCTIONS   Only take over-the-counter or prescription medicines as directed by your health care provider.  If antibiotic medicine was prescribed, take it as directed. Make sure you finish it even if you start to feel better.  Tell all sexual partners that you have a vaginal infection. They should see their health care provider and be treated if they have problems, such as a mild rash or itching.  During treatment, it is important that you follow these instructions:  Avoid sexual activity or use condoms correctly.  Do not douche.  Avoid alcohol as directed by your health care provider.  Avoid breastfeeding as directed by your health care provider. SEEK MEDICAL CARE IF:   Your symptoms are not improving after 3 days of treatment.  You have increased discharge or pain.  You have a fever. MAKE SURE YOU:   Understand these instructions.  Will watch your condition.  Will get help right away if you are not doing well or get worse. FOR MORE INFORMATION  Centers for Disease Control and Prevention, Division of STD Prevention: AppraiserFraud.fi American Sexual Health Association (ASHA): www.ashastd.org    This information is not intended to replace advice given to you by your health care provider. Make sure you discuss any questions you have with your health care provider.   Document Released: 10/31/2005 Document Revised: 11/21/2014 Document Reviewed: 06/12/2013 Elsevier Interactive Patient Education [employer].

## 2016-08-15 NOTE — Addendum Note (Signed)
Addended by: Thurnell Garbe A on: 08/15/2016 02:59 PM   Modules accepted: Orders

## 2016-08-16 ENCOUNTER — Ambulatory Visit (INDEPENDENT_AMBULATORY_CARE_PROVIDER_SITE_OTHER): Payer: BLUE CROSS/BLUE SHIELD | Admitting: Family Medicine

## 2016-08-16 ENCOUNTER — Telehealth: Payer: Self-pay | Admitting: Family Medicine

## 2016-08-16 DIAGNOSIS — Z23 Encounter for immunization: Secondary | ICD-10-CM | POA: Diagnosis not present

## 2016-08-16 LAB — GC/CHLAMYDIA PROBE AMP
CT Probe RNA: NOT DETECTED
GC PROBE AMP APTIMA: NOT DETECTED

## 2016-08-16 NOTE — Telephone Encounter (Signed)
Spoke to the pt and informed her that [facility] does not need to see her for medical clearance.

## 2016-08-16 NOTE — Telephone Encounter (Signed)
No need for  Ov  Saw her in April and no  Contraindications about surgery at that time

## 2016-08-16 NOTE — Telephone Encounter (Signed)
Pt is having surgery on 09/15/16 for a removal of salivary gland through ENT.  ENT has left it up to the pt as to whether she wants a surgical clearance visit.  Pt would like to know if [facility] would like to see her.  Please advise.

## 2016-09-09 ENCOUNTER — Ambulatory Visit (INDEPENDENT_AMBULATORY_CARE_PROVIDER_SITE_OTHER): Payer: BLUE CROSS/BLUE SHIELD | Admitting: Urgent Care

## 2016-09-09 ENCOUNTER — Telehealth: Payer: Self-pay | Admitting: Internal Medicine

## 2016-09-09 VITALS — BP 100/70 | HR 97 | Temp 97.6°F

## 2016-09-09 DIAGNOSIS — T7840XA Allergy, unspecified, initial encounter: Secondary | ICD-10-CM | POA: Diagnosis not present

## 2016-09-09 DIAGNOSIS — L500 Allergic urticaria: Secondary | ICD-10-CM

## 2016-09-09 DIAGNOSIS — T63481A Toxic effect of venom of other arthropod, accidental (unintentional), initial encounter: Secondary | ICD-10-CM | POA: Diagnosis not present

## 2016-09-09 MED ORDER — CETIRIZINE HCL 10 MG PO TABS: 10.0000 mg | ORAL_TABLET | Freq: Every day | ORAL | 11 refills | Status: DC

## 2016-09-09 MED ORDER — RANITIDINE HCL 150 MG PO TABS: 150.0000 mg | ORAL_TABLET | Freq: Two times a day (BID) | ORAL | 0 refills | Status: DC

## 2016-09-09 MED ORDER — EPINEPHRINE 0.3 MG/0.3ML IJ SOAJ: 0.3000 mg | Freq: Once | INTRAMUSCULAR | 1 refills | Status: AC

## 2016-09-09 NOTE — Addendum Note (Signed)
Addended by: Jaynee Eagles on: 09/09/2016 05:24 PM   Modules accepted: Orders

## 2016-09-09 NOTE — Telephone Encounter (Signed)
[facility] - Client [facility] Patient Name: Debra Woodward DOB: 14-Aug-1951 Initial Comment Caller states was cutting some shrubbery. Thinks was stung by a yellow jacket. Stung in the throat area. Area is red. Wants to know where to go to have it looked at. Nurse Assessment Nurse: Dimas Chyle, RN, Dellis Filbert Date/Time Eilene Ghazi Time): 09/09/2016 3:38:39 PM Confirm and document reason for call. If symptomatic, describe symptoms. You must click the next button to save text entered. ---Caller states was cutting some shrubbery. Thinks was stung by a yellow jacket. Stung in the throat area. Area is red. Wants to know where to go to have it looked at. No difficulty breathing. Has the patient traveled out of the country within the last 30 days? ---No Does the patient have any new or worsening symptoms? ---Yes Will a triage be completed? ---Yes Related visit to physician within the last 2 weeks? ---No Does the PT have any chronic conditions? (i.e. diabetes, asthma, etc.) ---No Is this a behavioral health or substance abuse call? ---No Guidelines Guideline Title Affirmed Question Affirmed Notes Bee or Yellow Jacket Sting [1] Red streak or red line AND [2] length > 2 inches (5 cm) Final Disposition User See Physician within 4 Hours (or PCP triage) Dimas Chyle, RN, Dellis Filbert Referrals [facility] Disagree/Comply: Comply

## 2016-09-09 NOTE — Addendum Note (Signed)
Addended by: Jaynee Eagles on: 09/09/2016 06:00 PM   Modules accepted: Orders

## 2016-09-09 NOTE — Patient Instructions (Addendum)
Please take benadryl 25m tonight and tomorrow at least 6 hours apart. If your hives resolve, then switch to Zyrtec and Zantac as written in your visit summary.   Anaphylactic Reaction An anaphylactic reaction is a sudden, severe allergic reaction that involves the whole body. It can be life threatening. A hospital stay is often required. People with asthma, eczema, or hay fever are slightly more likely to have an anaphylactic reaction. CAUSES  An anaphylactic reaction may be caused by anything to which you are allergic. After being exposed to the allergic substance, your immune system becomes sensitized to it. When you are exposed to that allergic substance again, an allergic reaction can occur. Common causes of an anaphylactic reaction include:  Medicines.  Foods, especially peanuts, wheat, shellfish, milk, and eggs.  Insect bites or stings.  Blood products.  Chemicals, such as dyes, latex, and contrast material used for imaging tests. SYMPTOMS  When an allergic reaction occurs, the body releases histamine and other substances. These substances cause symptoms such as tightening of the airway. Symptoms often develop within seconds or minutes of exposure. Symptoms may include:  Skin rash or hives.  Itching.  Chest tightness.  Swelling of the eyes, tongue, or lips.  Trouble breathing or swallowing.  Lightheadedness or fainting.  Anxiety or confusion.  Stomach pains, vomiting, or diarrhea.  Nasal congestion.  A fast or irregular heartbeat (palpitations). DIAGNOSIS  Diagnosis is based on your history of recent exposure to allergic substances, your symptoms, and a physical exam. Your caregiver may also perform blood or urine tests to confirm the diagnosis. TREATMENT  Epinephrine medicine is the main treatment for an anaphylactic reaction. Other medicines that may be used for treatment include antihistamines, steroids, and albuterol. In severe cases, fluids and medicine to  support blood pressure may be given through an intravenous line (IV). Even if you improve after treatment, you need to be observed to make sure your condition does not get worse. This may require a stay in the hospital. [place] medical alert bracelet or necklace stating your allergy.  You and your family must learn how to use an anaphylaxis kit or give an epinephrine injection to temporarily treat an emergency allergic reaction. Always carry your epinephrine injection or anaphylaxis kit with you. This can be lifesaving if you have a severe reaction.  Do not drive or perform tasks after treatment until the medicines used to treat your reaction have worn off, or until your caregiver says it is okay.  If you have hives or a rash:  Take medicines as directed by your caregiver.  You may use an over-the-counter antihistamine (diphenhydramine) as needed.  Apply cold compresses to the skin or take baths in cool water. Avoid hot baths or showers. SEEK MEDICAL CARE IF:   You develop symptoms of an allergic reaction to a new substance. Symptoms may start right away or minutes later.  You develop a rash, hives, or itching.  You develop new symptoms. SEEK IMMEDIATE MEDICAL CARE IF:   You have swelling of the mouth, difficulty breathing, or wheezing.  You have a tight feeling in your chest or throat.  You develop hives, swelling, or itching all over your body.  You develop severe vomiting or diarrhea.  You feel faint or pass out. This is an emergency. Use your epinephrine injection or anaphylaxis kit as you have been instructed. Call your local emergency services (911 in [place] Even if you improve after the injection, you need to  be examined at a hospital emergency department. MAKE SURE YOU:   Understand these instructions.  Will watch your condition.  Will get help right away if you are not doing well or get worse.   This information is not intended to replace  advice given to you by your health care provider. Make sure you discuss any questions you have with your health care provider.   Document Released: 10/31/2005 Document Revised: 11/05/2013 Document Reviewed: 05/13/2015 Elsevier Interactive Patient Education 2016 Elsevier Inc.     IF you received an x-ray today, you will receive an invoice from [facility]. Please contact [facility] at [phone] with questions or concerns regarding your invoice.   IF you received labwork today, you will receive an invoice from [facility]. Please contact [facility] at [phone] with questions or concerns regarding your invoice.   Our billing staff will not be able to assist you with questions regarding bills from these companies.  You will be contacted with the lab results as soon as they are available. The fastest way to get your results is to activate your My Chart account. Instructions are located on the last page of this paperwork. If you have not heard from us regarding the results in 2 weeks, please contact this office.      

## 2016-09-09 NOTE — Telephone Encounter (Signed)
Spoke with pt and she has taken a Benadryl. She denies any facial or throat swelling or difficulty breathing. She was walking in to [facility] [place]. Nothing further needed.

## 2016-09-09 NOTE — Progress Notes (Addendum)
By signing my name below, I, Mesha Guinyard, attest that this documentation has been prepared under the direction and in the presence of Jaynee Eagles, [place].  Electronically Signed: Verlee Monte, Medical Scribe. 09/09/16. 4:10 PM.  MRN: [id number] DOB: [birth year]  Subjective:   Debra Woodward is a 64 y.o. female presenting for chief complaint of bee sting onset 30 mins ago. Reports current nausea, emesis as she's being triaged, diaphoretic, some throat swelling, burning sensation in her hands. Took 2 benadryl 30 mins ago without relief to her symptoms. Concerned about the surgery she's scheduled to have in 6 days and takes 500mg  cephalexin for her surgery. Had bee sting in her hands last year and caused her hands to swell with hives and felt burning sensation. Regularly takes womens 50+ daily vitamin. Denies chest pain, chest tightness, SOB, and abdominal pain, tongue swelling.  Current Meds  Medication Sig   celecoxib (CELEBREX) 200 MG capsule Take 1 capsule (200 mg total) by mouth 2 (two) times daily.   cephALEXin (KEFLEX) 500 MG capsule Take 500 mg by mouth 4 (four) times daily.   Cetirizine HCl (ZYRTEC ALLERGY PO) Take by mouth as needed. Reported on 12/14/2015   Cholecalciferol (VITAMIN D3) 2000 UNITS capsule Take 2,000 Units by mouth daily.   Cod Liver Oil CAPS Take 1 capsule by mouth daily.   docusate sodium (COLACE) 100 MG capsule Take 100 mg by mouth 3 (three) times a week.    fexofenadine (ALLEGRA) 180 MG tablet Take 180 mg by mouth daily. Reported on 12/14/2015   loteprednol (LOTEMAX) 0.2 % SUSP Place 1 drop into both eyes daily.   metroNIDAZOLE (FLAGYL) 500 MG tablet Take 1 tablet (500 mg total) by mouth 2 (two) times daily.   Multiple Vitamin (MULTIVITAMIN) tablet Take 1 tablet by mouth daily.     Olopatadine HCl (PATADAY) 0.2 % SOLN Apply 1 drop to eye daily.   Debra Woodward is allergic to prednisone.  Debra Woodward  has a past medical history of ALLERGIC RHINITIS (09/14/2006);  Fibroid; adenomatous colonic polyps (07/28/2015); and Seasonal allergies. Also  has a past surgical history that includes Abdominal hysterectomy (1996); Knee arthroscopy (Right, 2010); Carpal tunnel release (Right, 2008); Toe Surgery (Bilateral); Breast surgery (Bilateral, 1983, 1996); Cystectomy (Bilateral, 1983, 1988); Total knee arthroplasty (Right, 2011); and Colonoscopy.  Objective:   Vitals: BP 100/70 (BP Location: Right Arm, Patient Position: Sitting, Cuff Size: Large)    Pulse 97    Temp 97.6 F (36.4 C) (Oral)   Physical Exam  Constitutional: She is oriented to person, place, and time. She appears well-developed and well-nourished.  HENT:  Mouth/Throat: Oropharynx is clear and moist.  Cardiovascular: Normal rate, regular rhythm and intact distal pulses.  Exam reveals no gallop and no friction rub.   No murmur heard. Pulmonary/Chest: Effort normal and breath sounds normal. No respiratory distress. She has no wheezes. She has no rales.  Musculoskeletal: She exhibits edema (right hand).  Neurological: She is alert and oriented to person, place, and time.  Skin: Rash (scattered across her back) noted. Rash is urticarial (upper arm and rt chest). She is diaphoretic. There is erythema (large patch over right inner thigh).   Assessment and Plan :   1. Allergic reaction, initial encounter 2. Insect stings, accidental or unintentional, initial encounter 3. Allergic urticaria - Unwilling to take steroids because of adverse reactions. Patient took benadryl at home, also took Zyrtec and Zantac in clinic. Due to her history of previous reaction to bee sting, current symptoms and presentation,  unwillingness to take prednisone, I decided to call EMS for transport to ED for further evaluation, management.   Jaynee Eagles, PA-C [facility] [phone] 09/09/2016 4:28 PM    UPDATE: Patient refused EMS transport to ED. I discussed potential for serious  allergic reaction given second exposure to at least 2 bee stings today. Patient removed and demonstrated the stingers. She has improved significantly with her urticaria s/p benadryl, zyrtec and zantac. Patient verbalized understanding and agreed to antihistamine regimen since she is refusing steroid course or injection. She reviewed precautions and will report to ED via EMS if she worsens.

## 2016-09-15 ENCOUNTER — Other Ambulatory Visit: Payer: Self-pay

## 2016-09-15 ENCOUNTER — Other Ambulatory Visit: Payer: Self-pay | Admitting: Otolaryngology

## 2016-09-15 DIAGNOSIS — K1123 Chronic sialoadenitis: Secondary | ICD-10-CM | POA: Diagnosis not present

## 2016-09-15 DIAGNOSIS — K116 Mucocele of salivary gland: Secondary | ICD-10-CM | POA: Diagnosis not present

## 2016-09-16 ENCOUNTER — Telehealth: Payer: Self-pay | Admitting: *Deleted

## 2016-09-16 NOTE — Telephone Encounter (Signed)
Pt called c/o unable to empty bladder completely x 2 days, pt said she would urinate some, no burning or pain with urination. Pt asked what to do, I advised patient to schedule office visit with provider. Pt said she will call back to schedule office visit.

## 2016-09-28 ENCOUNTER — Ambulatory Visit: Payer: PPO

## 2016-09-30 ENCOUNTER — Ambulatory Visit (INDEPENDENT_AMBULATORY_CARE_PROVIDER_SITE_OTHER): Payer: PPO

## 2016-09-30 DIAGNOSIS — Z23 Encounter for immunization: Secondary | ICD-10-CM | POA: Diagnosis not present

## 2016-10-17 DIAGNOSIS — H40023 Open angle with borderline findings, high risk, bilateral: Secondary | ICD-10-CM | POA: Diagnosis not present

## 2016-10-17 DIAGNOSIS — H2513 Age-related nuclear cataract, bilateral: Secondary | ICD-10-CM | POA: Diagnosis not present

## 2016-10-17 DIAGNOSIS — H40033 Anatomical narrow angle, bilateral: Secondary | ICD-10-CM | POA: Diagnosis not present

## 2016-10-17 DIAGNOSIS — H1013 Acute atopic conjunctivitis, bilateral: Secondary | ICD-10-CM | POA: Diagnosis not present

## 2016-10-18 ENCOUNTER — Encounter: Payer: Self-pay | Admitting: Women's Health

## 2016-10-18 ENCOUNTER — Ambulatory Visit (INDEPENDENT_AMBULATORY_CARE_PROVIDER_SITE_OTHER): Payer: PPO | Admitting: Women's Health

## 2016-10-18 VITALS — BP 130/78 | Ht 64.0 in | Wt 192.0 lb

## 2016-10-18 DIAGNOSIS — R350 Frequency of micturition: Secondary | ICD-10-CM | POA: Diagnosis not present

## 2016-10-18 DIAGNOSIS — B9689 Other specified bacterial agents as the cause of diseases classified elsewhere: Secondary | ICD-10-CM | POA: Diagnosis not present

## 2016-10-18 DIAGNOSIS — N760 Acute vaginitis: Secondary | ICD-10-CM

## 2016-10-18 LAB — URINALYSIS W MICROSCOPIC + REFLEX CULTURE
BACTERIA UA: NONE SEEN [HPF]
BILIRUBIN URINE: NEGATIVE
CASTS: NONE SEEN [LPF]
CRYSTALS: NONE SEEN [HPF]
Glucose, UA: NEGATIVE
Hgb urine dipstick: NEGATIVE
KETONES UR: NEGATIVE
Leukocytes, UA: NEGATIVE
Nitrite: NEGATIVE
PROTEIN: NEGATIVE
RBC / HPF: NONE SEEN RBC/HPF (ref <=2)
Specific Gravity, Urine: 1.02 (ref 1.001–1.035)
WBC UA: NONE SEEN WBC/HPF (ref <=5)
Yeast: NONE SEEN [HPF]
pH: 6.5 (ref 5.0–8.0)

## 2016-10-18 LAB — WET PREP FOR TRICH, YEAST, CLUE
TRICH WET PREP: NONE SEEN
YEAST WET PREP: NONE SEEN

## 2016-10-18 MED ORDER — METRONIDAZOLE 500 MG PO TABS: 500.0000 mg | ORAL_TABLET | Freq: Two times a day (BID) | ORAL | 0 refills | Status: DC

## 2016-10-18 MED ORDER — FLUCONAZOLE 150 MG PO TABS: 150.0000 mg | ORAL_TABLET | Freq: Once | ORAL | 1 refills | Status: AC

## 2016-10-18 NOTE — Patient Instructions (Signed)

## 2016-10-18 NOTE — Progress Notes (Signed)
Presents with complaint of hesitancy with urination but able to empty bladder after sitting without pain or burning. Scant vaginal discharge, mild vaginal itching with occassional odor. Denies abdominal pain or fever. TAH for fibroids on no HRT. Was treated for BV 08/2016. Recent parotid gland surgery has follow-up scheduled this week. Was on antibiotic for 2 weeks.  Exam: Appears well. External genitalia mild erythema, speculum exam scant discharge without odor noted, wet prep positive for moderate clues, TNTC bacteria. UA: Negative, 6-10 squamous epithelials  Bacteria vaginosis  Plan: Urine culture pending. Flagyl 500 twice daily for 7 days, alcohol precautions reviewed. Diflucan 150 by mouth 1 dose. Instructed to call if no relief of symptoms.

## 2016-10-20 DIAGNOSIS — K1123 Chronic sialoadenitis: Secondary | ICD-10-CM | POA: Diagnosis not present

## 2017-01-23 DIAGNOSIS — H1013 Acute atopic conjunctivitis, bilateral: Secondary | ICD-10-CM | POA: Diagnosis not present

## 2017-01-23 DIAGNOSIS — H04203 Unspecified epiphora, bilateral lacrimal glands: Secondary | ICD-10-CM | POA: Diagnosis not present

## 2017-01-23 DIAGNOSIS — H04123 Dry eye syndrome of bilateral lacrimal glands: Secondary | ICD-10-CM | POA: Diagnosis not present

## 2017-01-23 DIAGNOSIS — H0289 Other specified disorders of eyelid: Secondary | ICD-10-CM | POA: Diagnosis not present

## 2017-02-24 ENCOUNTER — Other Ambulatory Visit: Payer: Self-pay | Admitting: Internal Medicine

## 2017-02-24 DIAGNOSIS — Z1231 Encounter for screening mammogram for malignant neoplasm of breast: Secondary | ICD-10-CM

## 2017-03-07 ENCOUNTER — Other Ambulatory Visit: Payer: PPO

## 2017-03-08 ENCOUNTER — Other Ambulatory Visit (INDEPENDENT_AMBULATORY_CARE_PROVIDER_SITE_OTHER): Payer: PPO

## 2017-03-08 DIAGNOSIS — Z0000 Encounter for general adult medical examination without abnormal findings: Secondary | ICD-10-CM

## 2017-03-08 LAB — CBC WITH DIFFERENTIAL/PLATELET
BASOS ABS: 0 10*3/uL (ref 0.0–0.1)
Basophils Relative: 0.5 % (ref 0.0–3.0)
Eosinophils Absolute: 0.2 10*3/uL (ref 0.0–0.7)
Eosinophils Relative: 3.4 % (ref 0.0–5.0)
HCT: 42.1 % (ref 36.0–46.0)
HEMOGLOBIN: 14.3 g/dL (ref 12.0–15.0)
LYMPHS ABS: 2.1 10*3/uL (ref 0.7–4.0)
Lymphocytes Relative: 38.4 % (ref 12.0–46.0)
MCHC: 33.9 g/dL (ref 30.0–36.0)
MCV: 94.7 fl (ref 78.0–100.0)
MONO ABS: 0.5 10*3/uL (ref 0.1–1.0)
MONOS PCT: 9.4 % (ref 3.0–12.0)
NEUTROS PCT: 48.3 % (ref 43.0–77.0)
Neutro Abs: 2.7 10*3/uL (ref 1.4–7.7)
Platelets: 369 10*3/uL (ref 150.0–400.0)
RBC: 4.45 Mil/uL (ref 3.87–5.11)
RDW: 14.1 % (ref 11.5–15.5)
WBC: 5.6 10*3/uL (ref 4.0–10.5)

## 2017-03-08 LAB — HEPATIC FUNCTION PANEL
ALK PHOS: 78 U/L (ref 39–117)
ALT: 13 U/L (ref 0–35)
AST: 14 U/L (ref 0–37)
Albumin: 4.1 g/dL (ref 3.5–5.2)
BILIRUBIN DIRECT: 0.1 mg/dL (ref 0.0–0.3)
Total Bilirubin: 0.5 mg/dL (ref 0.2–1.2)
Total Protein: 6.5 g/dL (ref 6.0–8.3)

## 2017-03-08 LAB — BASIC METABOLIC PANEL
BUN: 12 mg/dL (ref 6–23)
CALCIUM: 9.8 mg/dL (ref 8.4–10.5)
CO2: 30 mEq/L (ref 19–32)
Chloride: 108 mEq/L (ref 96–112)
Creatinine, Ser: 0.79 mg/dL (ref 0.40–1.20)
GFR: 93.8 mL/min (ref >60.00)
Glucose, Bld: 120 mg/dL — ABNORMAL HIGH (ref 70–99)
POTASSIUM: 4.1 meq/L (ref 3.5–5.1)
SODIUM: 144 meq/L (ref 135–145)

## 2017-03-08 LAB — LIPID PANEL
CHOL/HDL RATIO: 2
Cholesterol: 162 mg/dL (ref 0–200)
HDL: 70 mg/dL (ref >39.00)
LDL CALC: 67 mg/dL (ref 0–99)
NONHDL: 91.84
Triglycerides: 124 mg/dL (ref 0.0–149.0)
VLDL: 24.8 mg/dL (ref 0.0–40.0)

## 2017-03-08 LAB — TSH: TSH: 1.74 u[IU]/mL (ref 0.35–4.50)

## 2017-03-14 ENCOUNTER — Encounter: Payer: BLUE CROSS/BLUE SHIELD | Admitting: Internal Medicine

## 2017-03-14 ENCOUNTER — Ambulatory Visit (INDEPENDENT_AMBULATORY_CARE_PROVIDER_SITE_OTHER): Payer: PPO | Admitting: Women's Health

## 2017-03-14 ENCOUNTER — Encounter: Payer: Self-pay | Admitting: Women's Health

## 2017-03-14 VITALS — BP 134/80 | Ht 64.0 in | Wt 194.0 lb

## 2017-03-14 DIAGNOSIS — N898 Other specified noninflammatory disorders of vagina: Secondary | ICD-10-CM | POA: Diagnosis not present

## 2017-03-14 DIAGNOSIS — N760 Acute vaginitis: Secondary | ICD-10-CM

## 2017-03-14 DIAGNOSIS — Z01419 Encounter for gynecological examination (general) (routine) without abnormal findings: Secondary | ICD-10-CM

## 2017-03-14 DIAGNOSIS — B9689 Other specified bacterial agents as the cause of diseases classified elsewhere: Secondary | ICD-10-CM

## 2017-03-14 LAB — WET PREP FOR TRICH, YEAST, CLUE
TRICH WET PREP: NONE SEEN
YEAST WET PREP: NONE SEEN

## 2017-03-14 MED ORDER — METRONIDAZOLE 500 MG PO TABS: 500.0000 mg | ORAL_TABLET | Freq: Two times a day (BID) | ORAL | 0 refills | Status: DC

## 2017-03-14 NOTE — Progress Notes (Signed)
Debra Woodward 17-Jan-1951 [id number]    History:    Presents for breast and pelvic exam with complaint of increased vaginal discharge with an odor. Not sexually active in years. 1996 TAH for fibroids on no HRT. Normal Pap and mammogram history. 05/2015 T score -0.5 at hips stable. Has had both Zostavax and Pneumovax. Hypertension managed by primary care. 2016 benign colon polyp 3 year follow-up.   Past medical history, past surgical history, family history and social history were all reviewed and documented in the EPIC chart. Originally from [place]. Substitute teacher. Parents diabetes. 2 children and 2 grands all doing well.  ROS:  A ROS was performed and pertinent positives and negatives are included.  Exam:  Vitals:   03/14/17 0758  BP: 134/80  Weight: 194 lb (88 kg)  Height: 5\' 4"  (1.626 m)   Body mass index is 33.3 kg/m.   General appearance:  Normal Thyroid:  Symmetrical, normal in size, without palpable masses or nodularity. Respiratory  Auscultation:  Clear without wheezing or rhonchi Cardiovascular  Auscultation:  Regular rate, without rubs, murmurs or gallops  Edema/varicosities:  Not grossly evident Abdominal  Soft,nontender, without masses, guarding or rebound.  Liver/spleen:  No organomegaly noted  Hernia:  None appreciated  Skin  Inspection:  Grossly normal   Breasts: Examined lying and sitting.     Right: Without masses, retractions, discharge or axillary adenopathy.     Left: Without masses, retractions, discharge or axillary adenopathy. Gentitourinary   Inguinal/mons:  Normal without inguinal adenopathy  External genitalia:  Normal  BUS/Urethra/Skene's glands:  Normal  Vagina:  Scant discharge with odor, wet prep positive for moderate clues, TNTC bacteria  Cervix:  And uterus absent  Adnexa/parametria:     Rt: Without masses or tenderness.   Lt: Without masses or tenderness.  Anus and perineum: Normal  Digital rectal exam: Normal sphincter tone without  palpated masses or tenderness  Assessment/Plan:  66 y.o. SBF G2 3 P2 for breast and pelvic exam with complaint of vaginal discharge.  1996 TAH for fibroids on no HRT Bacteria vaginosis Hypertension-primary care manages labs and meds Smoker less than 5 cigarettes daily  Plan: Flagyl 500 twice daily for 7 days alcohol precautions reviewed. Instructed to call if no relief of discharge. Aware of hazards of smoking is trying to cut down. SBE's, continue annual screening mammogram has scheduled. Continue regular daily exercise of walking, calcium rich diet, vitamin D 2000 daily encouraged. Continue active lifestyle, home safety reviewed. Pap screenings reviewed.    [place], 8:47 AM 03/14/2017

## 2017-03-14 NOTE — Patient Instructions (Signed)
Carbohydrate Counting for Diabetes Mellitus, Adult Carbohydrate counting is a method for keeping track of how many carbohydrates you eat. Eating carbohydrates naturally increases the amount of sugar (glucose) in the blood. Counting how many carbohydrates you eat helps keep your blood glucose within normal limits, which helps you manage your diabetes (diabetes mellitus). It is important to know how many carbohydrates you can safely have in each meal. This is different for every person. A diet and nutrition specialist (registered dietitian) can help you make a meal plan and calculate how many carbohydrates you should have at each meal and snack. Carbohydrates are found in the following foods:  Grains, such as breads and cereals.  Dried beans and soy products.  Starchy vegetables, such as potatoes, peas, and corn.  Fruit and fruit juices.  Milk and yogurt.  Sweets and snack foods, such as cake, cookies, candy, chips, and soft drinks. How do I count carbohydrates? There are two ways to count carbohydrates in food. You can use either of the methods or a combination of both. Reading "Nutrition Facts" on packaged food  The "Nutrition Facts" list is included on the labels of almost all packaged foods and beverages in the [place] It includes:  The serving size.  Information about nutrients in each serving, including the grams (g) of carbohydrate per serving. To use the "Nutrition Facts":  Decide how many servings you will have.  Multiply the number of servings by the number of carbohydrates per serving.  The resulting number is the total amount of carbohydrates that you will be having. Learning standard serving sizes of other foods  When you eat foods containing carbohydrates that are not packaged or do not include "Nutrition Facts" on the label, you need to measure the servings in order to count the amount of carbohydrates:  Measure the foods that you will eat with a food scale or measuring  cup, if needed.  Decide how many standard-size servings you will eat.  Multiply the number of servings by 15. Most carbohydrate-rich foods have about 15 g of carbohydrates per serving.  For example, if you eat 8 oz (170 g) of strawberries, you will have eaten 2 servings and 30 g of carbohydrates (2 servings x 15 g = 30 g).  For foods that have more than one food mixed, such as soups and casseroles, you must count the carbohydrates in each food that is included. The following list contains standard serving sizes of common carbohydrate-rich foods. Each of these servings has about 15 g of carbohydrates:   hamburger bun or  English muffin.   oz (15 mL) syrup.   oz (14 g) jelly.  1 slice of bread.  1 six-inch tortilla.  3 oz (85 g) cooked rice or pasta.  4 oz (113 g) cooked dried beans.  4 oz (113 g) starchy vegetable, such as peas, corn, or potatoes.  4 oz (113 g) hot cereal.  4 oz (113 g) mashed potatoes or  of a large baked potato.  4 oz (113 g) canned or frozen fruit.  4 oz (120 mL) fruit juice.  4-6 crackers.  6 chicken nuggets.  6 oz (170 g) unsweetened dry cereal.  6 oz (170 g) plain fat-free yogurt or yogurt sweetened with artificial sweeteners.  8 oz (240 mL) milk.  8 oz (170 g) fresh fruit or one small piece of fruit.  24 oz (680 g) popped popcorn. Example of carbohydrate counting Sample meal  3 oz (85 g) chicken breast.  6 oz (  170 g) brown rice.  4 oz (113 g) corn.  8 oz (240 mL) milk.  8 oz (170 g) strawberries with sugar-free whipped topping. Carbohydrate calculation 1. Identify the foods that contain carbohydrates:  Rice.  Corn.  Milk.  Strawberries. 2. Calculate how many servings you have of each food:  2 servings rice.  1 serving corn.  1 serving milk.  1 serving strawberries. 3. Multiply each number of servings by 15 g:  2 servings rice x 15 g = 30 g.  1 serving corn x 15 g = 15 g.  1 serving milk x 15 g = 15  g.  1 serving strawberries x 15 g = 15 g. 4. Add together all of the amounts to find the total grams of carbohydrates eaten:  30 g + 15 g + 15 g + 15 g = 75 g of carbohydrates total. This information is not intended to replace advice given to you by your health care provider. Make sure you discuss any questions you have with your health care provider. Document Released: 10/31/2005 Document Revised: 05/20/2016 Document Reviewed: 04/13/2016 Elsevier Interactive Patient Education  2017 [employer]  

## 2017-03-20 NOTE — Progress Notes (Signed)
Chief Complaint  Patient presents with  . Medicare Wellness    HPI: Debra Woodward 66 y.o. comes in today for Preventive Medicare welcome cpx visit .Since last visit. Had  Removal of  Parotid gland   To be seen  Dr Lucia Gaskins .  In 6 mos fu.  mammof and gyne check  Feels that bp is up cause of  Flagyl    Trying to eat right and exercise .  Still tobacco  A few a day  working on it .  Very skeptical of bp meds cause  She feels they  Are mostly se and dangerous    Health Maintenance  Topic Date Due  . PAP SMEAR  08/14/2014  . INFLUENZA VACCINE  06/14/2017  . PNA vac Low Risk Adult (2 of 2 - PPSV23) 09/30/2017  . MAMMOGRAM  04/06/2018  . COLONOSCOPY  07/22/2018  . TETANUS/TDAP  03/14/2019  . DEXA SCAN  Completed  . Hepatitis C Screening  Completed  . HIV Screening  Completed   Health Maintenance Review LIFESTYLE:  TAD few cig  No d etoh  Sugar beverages: Sleep:ok 7    Hearing: ok   Vision:  No limitations at present . Last eye check UTD   Dr Herbert Deaner .    Safety:  Has smoke detector and wears seat belts.  No firearms. No excess sun exposure. Sees dentist regularly.  Falls: no  Advance directive :  Reviewed  Has one.  Memory: Felt to be good  , no concern from her or her family.  Depression: No anhedonia unusual crying or depressive symptoms  Nutrition: Eats well balanced diet; adequate calcium and vitamin D. No swallowing chewing problems.  Injury: no major injuries in the last six months.  Other healthcare providers:  Reviewed today . [employer]   Social:  Lives alone  . No pets.   Preventive parameters: up-to-date  Reviewed   ADLS:   There are no problems or need for assistance  driving, feeding, obtaining food, dressing, toileting and bathing, managing money using phone. She is independent.  EXERCISE/ HABITS  Per week   No tobacco 1 pack per week    etoh no blu moon.  d   suger [employer]   SLEEP: about 7    ROS:  GEN/ HEENT: No fever, significant weight  changes sweats headaches vision problems hearing changes, CV/ PULM; No chest pain shortness of breath cough, syncope,edema  change in exercise tolerance. GI /GU: No adominal pain, vomiting, change in bowel habits. No blood in the stool. No significant GU symptoms. SKIN/HEME: ,no acute skin rashes suspicious lesions or bleeding. No lymphadenopathy, nodules, masses.  NEURO/ PSYCH:  No neurologic signs such as weakness numbness. No depression anxiety. IMM/ Allergy: No unusual infections.  Allergy .   REST of 12 system review negative except as per HPI   Past Medical History:  Diagnosis Date  . ALLERGIC RHINITIS 09/14/2006  . Fibroid   . Hx of adenomatous colonic polyps 07/28/2015  . Seasonal allergies     Family History  Problem Relation Age of Onset  . Stroke Mother   . Diabetes Mother   . Diabetes Father   . Hypertension Sister   . Esophageal cancer Paternal Grandfather 44  . Colon cancer Neg Hx   . Stomach cancer Neg Hx   . Rectal cancer Neg Hx     Social History   Social History  . Marital status: Single    Spouse name: N/A  . Number of  children: N/A  . Years of education: N/A   Social History Main Topics  . Smoking status: Current Every Day Smoker    Packs/day: 0.25    Types: Cigarettes  . Smokeless tobacco: Never Used  . Alcohol use 0.0 oz/week     Comment: Rare  . Drug use: No  . Sexual activity: Yes    Birth control/ protection: Surgical     Comment: HYST   Other Topics Concern  . 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     Outpatient Encounter Prescriptions as of 03/22/2017  Medication Sig  . carboxymethylcellulose 1 % ophthalmic solution Apply 1 drop to eye daily.  . Cetirizine HCl (ZYRTEC ALLERGY PO) Take by mouth as needed. Reported on 12/14/2015  . Cholecalciferol (VITAMIN D3) 2000 UNITS capsule Take 2,000 Units by mouth daily.  Marland Kitchen Cod Liver Oil CAPS Take 1 capsule by  mouth daily.  Marland Kitchen docusate sodium (COLACE) 100 MG capsule Take 100 mg by mouth 3 (three) times a week.   . fexofenadine (ALLEGRA) 180 MG tablet Take 180 mg by mouth daily. Reported on 12/14/2015  . GINKGO BILOBA PO Take 2 capsules by mouth daily.  Marland Kitchen loteprednol (LOTEMAX) 0.2 % SUSP Place 1 drop into both eyes daily.  . meclizine (ANTIVERT) 12.5 MG tablet Take 1 tablet (12.5 mg total) by mouth 3 (three) times daily as needed for dizziness.  . metroNIDAZOLE (FLAGYL) 500 MG tablet Take 1 tablet (500 mg total) by mouth 2 (two) times daily.  . Multiple Vitamin (MULTIVITAMIN) tablet Take 1 tablet by mouth daily.    . Olopatadine HCl (PATADAY) 0.2 % SOLN Apply 1 drop to eye daily.  . ranitidine (ZANTAC) 150 MG tablet Take 1 tablet (150 mg total) by mouth 2 (two) times daily.   No facility-administered encounter medications on file as of 03/22/2017.     EXAM:  BP (!) 140/94 (BP Location: Left Arm, Patient Position: Sitting, Cuff Size: Normal)   Pulse 90   Temp 98.4 F (36.9 C) (Oral)   Ht 5\' 4"  (1.626 m)   Wt 188 lb 11.2 oz (85.6 kg)   SpO2 96%   BMI 32.39 kg/m   Body mass index is 32.39 kg/m.  Physical Exam: Vital signs reviewed XTK:[id number] is a well-developed well-nourished alert cooperative   who appears stated age in no acute distress.  HEENT: normocephalic atraumatic , Eyes: PERRL EOM's full, conjunctiva clear, Nares: paten,t no deformity discharge or tenderness., Ears: no deformity EAC's clear TMs with normal landmarks. Mouth: clear OP, no lesions, edema.  Moist mucous membranes. Dentition in adequate repair. NECK: supple without masses, thyromegaly or bruits. Well healed parotid area scar  CHEST/PULM:  Clear to auscultation and percussion breath sounds equal no wheeze , rales or rhonchi. No chest wall deformities or tenderness. CV: PMI is nondisplaced, S1 S2 no gallops, murmurs, rubs. Peripheral pulses are full without delay.No JVD . Breast: normal by inspection . No dimpling, discharge,  masses, tenderness or discharge . ABDOMEN: Bowel sounds normal nontender  No guard or rebound, no hepato splenomegal no CVA tenderness.   Extremtities:  No clubbing cyanosis or edema, no acute joint swelling or redness no focal atrophy NEURO:  Oriented x3, cranial nerves 3-12 appear to be intact, no obvious focal weakness,gait within normal limits no abnormal reflexes or asymmetrical SKIN: No  acute rashes normal turgor, color, no bruising or petechiae. PSYCH: Oriented, good eye contact, no obvious depression anxiety, cognition and judgment appear normal. LN: no cervical axillary inguinal adenopathy No noted deficits in memory, attention, and speech.   Lab Results  Component Value Date   WBC 5.6 03/08/2017   HGB 14.3 03/08/2017   HCT 42.1 03/08/2017   PLT 369.0 03/08/2017   GLUCOSE 120 (H) 03/08/2017   CHOL 162 03/08/2017   TRIG 124.0 03/08/2017   HDL 70.00 03/08/2017   LDLCALC 67 03/08/2017   ALT 13 03/08/2017   AST 14 03/08/2017   NA 144 03/08/2017   K 4.1 03/08/2017   CL 108 03/08/2017   CREATININE 0.79 03/08/2017   BUN 12 03/08/2017   CO2 30 03/08/2017   TSH 1.74 03/08/2017   INR 0.94 09/07/2010   HGBA1C 6.3 03/22/2017   Labs reviewed with patient  ASSESSMENT AND PLAN:  Discussed the following assessment and plan:  Visit for preventive health examination - continued  efforts to dc all tobacco rx shingrix  today and counseled - Plan: POCT glycosylated hemoglobin (Hb A1C)  Medication management  Fasting hyperglycemia  Elevated blood pressure reading - disc goal  fu pt declines med intervnetions  will ilsi and fu Follow up   suger   6 months  Disc using med for  BP  elevated and  Even if no med  lsi and needs to monitor readings and advise getting own monitor  For home.  Patient Care Team: Nathalee Smarr, [name], MD as PCP - General (Internal Medicine) Huel Cote, NP as Nurse Practitioner (Obstetrics and Gynecology)  Patient Instructions  Continue lifestyle  intervention healthy eating and exercise . Blood sugar  Is in prediabetic range   Stopping tobacco  Is improtant to  Good health and reduction of  Risk for many diseases.   New blood pressure guidelines our safest to be 130/80. This is for heart health to prevent stroke heart attack and heart failure.  Check blood pressure readings at home [place]. RSV in about 2 months check blood pressure readings strongly considering getting her and monitor bringing it into the visit so we can correlate readings.    He will be due for a booster pneumococcal vaccine in a year Can get shingles vaccine at your pharmacy. Advise 3-D mammogram if you have dense breast.    DASH Eating Plan DASH stands for "Dietary Approaches to Stop Hypertension." The DASH eating plan is a healthy eating plan that has been shown to reduce high blood pressure (hypertension). It may also reduce your risk for type 2 diabetes, heart disease, and stroke. The DASH eating plan may also help with weight loss. What are tips for following this plan? General guidelines   Avoid eating more than 2,300 mg (milligrams) of salt (sodium) a day. If you have hypertension, you may need to reduce your sodium intake to 1,500 mg a day.  Limit alcohol intake to no more than 1 drink a day for nonpregnant women and 2 drinks a day for men. One drink equals 12 oz of beer, 5 oz of wine, or 1 oz of hard liquor.  Work with your health care provider to maintain a healthy body weight or to lose weight. Ask what an ideal weight is for you.  Get at least 30 minutes of exercise that causes your heart to beat faster (aerobic exercise) most days of the week. Activities may include walking, swimming, or biking.  Work with your health care  provider or diet and nutrition specialist (dietitian) to adjust your eating plan to your individual calorie needs. Reading food labels   Check food labels for the amount of sodium per serving. Choose foods  with less than 5 percent of the Daily Value of sodium. Generally, foods with less than 300 mg of sodium per serving fit into this eating plan.  To find whole grains, look for the word "whole" as the first word in the ingredient list. Shopping   Buy products labeled as "low-sodium" or "no salt added."  Buy fresh foods. Avoid canned foods and premade or frozen meals. Cooking   Avoid adding salt when cooking. Use salt-free seasonings or herbs instead of table salt or sea salt. Check with your health care provider or pharmacist before using salt substitutes.  Do not fry foods. Cook foods using healthy methods such as baking, boiling, grilling, and broiling instead.  Cook with heart-healthy oils, such as olive, canola, soybean, or sunflower oil. Meal planning    Eat a balanced diet that includes:  5 or more servings of fruits and vegetables each day. At each meal, try to fill half of your plate with fruits and vegetables.  Up to 6-8 servings of whole grains each day.  Less than 6 oz of lean meat, poultry, or fish each day. A 3-oz serving of meat is about the same size as a deck of cards. One egg equals 1 oz.  2 servings of low-fat dairy each day.  A serving of nuts, seeds, or beans 5 times each week.  Heart-healthy fats. Healthy fats called Omega-3 fatty acids are found in foods such as flaxseeds and coldwater fish, like sardines, salmon, and mackerel.  Limit how much you eat of the following:  Canned or prepackaged foods.  Food that is high in trans fat, such as fried foods.  Food that is high in saturated fat, such as fatty meat.  Sweets, desserts, sugary drinks, and other foods with added sugar.  Full-fat dairy products.  Do not salt foods before eating.  Try to eat at least 2 vegetarian meals each week.  Eat more home-cooked food and less restaurant, buffet, and fast food.  When eating at a restaurant, ask that your food be prepared with less salt or no salt, if  possible. What foods are recommended? The items listed may not be a complete list. Talk with your dietitian about what dietary choices are best for you. Grains  Whole-grain or whole-wheat bread. Whole-grain or whole-wheat pasta. Brown rice. [place]row. Bulgur. Whole-grain and low-sodium cereals. Pita bread. Low-fat, low-sodium crackers. Whole-wheat flour tortillas. Vegetables  Fresh or frozen vegetables (raw, steamed, roasted, or grilled). Low-sodium or reduced-sodium tomato and vegetable juice. Low-sodium or reduced-sodium tomato sauce and tomato paste. Low-sodium or reduced-sodium canned vegetables. Fruits  All fresh, dried, or frozen fruit. Canned fruit in natural juice (without added sugar). Meat and other protein foods  Skinless chicken or [place]. Ground chicken or [place]. Pork with fat trimmed off. Fish and seafood. Egg whites. Dried beans, peas, or lentils. Unsalted nuts, nut butters, and seeds. Unsalted canned beans. Lean cuts of beef with fat trimmed off. Low-sodium, lean deli meat. Dairy  Low-fat (1%) or fat-free (skim) milk. Fat-free, low-fat, or reduced-fat cheeses. Nonfat, low-sodium ricotta or cottage cheese. Low-fat or nonfat yogurt. Low-fat, low-sodium cheese. Fats and oils  Soft margarine without trans fats. Vegetable oil. Low-fat, reduced-fat, or light mayonnaise and salad dressings (reduced-sodium). Canola, safflower, olive, soybean, and sunflower oils. Avocado. Seasoning and other  foods  Herbs. Spices. Seasoning mixes without salt. Unsalted popcorn and pretzels. Fat-free sweets. What foods are not recommended? The items listed may not be a complete list. Talk with your dietitian about what dietary choices are best for you. Grains  Baked goods made with fat, such as croissants, muffins, or some breads. Dry pasta or rice meal packs. Vegetables  Creamed or fried vegetables. Vegetables in a cheese sauce. Regular canned vegetables (not low-sodium or reduced-sodium). Regular  canned tomato sauce and paste (not low-sodium or reduced-sodium). Regular tomato and vegetable juice (not low-sodium or reduced-sodium). Angie Fava. Olives. Fruits  Canned fruit in a light or heavy syrup. Fried fruit. Fruit in cream or butter sauce. Meat and other protein foods  Fatty cuts of meat. Ribs. Fried meat. Berniece Salines. Sausage. Bologna and other processed lunch meats. Salami. Fatback. Hotdogs. Bratwurst. Salted nuts and seeds. Canned beans with added salt. Canned or smoked fish. Whole eggs or egg yolks. Chicken or [place] with skin. Dairy  Whole or 2% milk, cream, and half-and-half. Whole or full-fat cream cheese. Whole-fat or sweetened yogurt. Full-fat cheese. Nondairy creamers. Whipped toppings. Processed cheese and cheese spreads. Fats and oils  Butter. Stick margarine. Lard. Shortening. Ghee. Bacon fat. Tropical oils, such as coconut, palm kernel, or palm oil. Seasoning and other foods  Salted popcorn and pretzels. Onion salt, garlic salt, seasoned salt, table salt, and sea salt. Worcestershire sauce. Tartar sauce. Barbecue sauce. Teriyaki sauce. Soy sauce, including reduced-sodium. Steak sauce. Canned and packaged gravies. Fish sauce. Oyster sauce. Cocktail sauce. Horseradish that you find on the shelf. Ketchup. Mustard. Meat flavorings and tenderizers. Bouillon cubes. Hot sauce and Tabasco sauce. Premade or packaged marinades. Premade or packaged taco seasonings. Relishes. Regular salad dressings. Where to find more information:  National Heart, Lung, and [place]: https://wilson-eaton.com/  American Heart Association: www.heart.org Summary  The DASH eating plan is a healthy eating plan that has been shown to reduce high blood pressure (hypertension). It may also reduce your risk for type 2 diabetes, heart disease, and stroke.  With the DASH eating plan, you should limit salt (sodium) intake to 2,300 mg a day. If you have hypertension, you may need to reduce your sodium intake to 1,500 mg a  day.  When on the DASH eating plan, aim to eat more fresh fruits and vegetables, whole grains, lean proteins, low-fat dairy, and heart-healthy fats.  Work with your health care provider or diet and nutrition specialist (dietitian) to adjust your eating plan to your individual calorie needs. This information is not intended to replace advice given to you by your health care provider. Make sure you discuss any questions you have with your health care provider. Document Released: 10/20/2011 Document Revised: 10/24/2016 Document Reviewed: 10/24/2016 Elsevier Interactive Patient Education  2017 [place]. Hafiz Irion M.D.

## 2017-03-22 ENCOUNTER — Encounter: Payer: Self-pay | Admitting: Internal Medicine

## 2017-03-22 ENCOUNTER — Ambulatory Visit (INDEPENDENT_AMBULATORY_CARE_PROVIDER_SITE_OTHER): Payer: PPO | Admitting: Internal Medicine

## 2017-03-22 VITALS — BP 140/94 | HR 90 | Temp 98.4°F | Ht 64.0 in | Wt 188.7 lb

## 2017-03-22 DIAGNOSIS — Z79899 Other long term (current) drug therapy: Secondary | ICD-10-CM

## 2017-03-22 DIAGNOSIS — R030 Elevated blood-pressure reading, without diagnosis of hypertension: Secondary | ICD-10-CM | POA: Diagnosis not present

## 2017-03-22 DIAGNOSIS — Z0000 Encounter for general adult medical examination without abnormal findings: Secondary | ICD-10-CM

## 2017-03-22 DIAGNOSIS — R7301 Impaired fasting glucose: Secondary | ICD-10-CM | POA: Diagnosis not present

## 2017-03-22 LAB — POCT GLYCOSYLATED HEMOGLOBIN (HGB A1C): Hemoglobin A1C: 6.3

## 2017-03-22 NOTE — Patient Instructions (Addendum)
Continue lifestyle intervention healthy eating and exercise . Blood sugar  Is in prediabetic range   Stopping tobacco  Is improtant to  Good health and reduction of  Risk for many diseases.   New blood pressure guidelines our safest to be 130/80. This is for heart health to prevent stroke heart attack and heart failure.  Check blood pressure readings at home [place]. RSV in about 2 months check blood pressure readings strongly considering getting her and monitor bringing it into the visit so we can correlate readings.    He will be due for a booster pneumococcal vaccine in a year Can get shingles vaccine at your pharmacy. Advise 3-D mammogram if you have dense breast.    DASH Eating Plan DASH stands for "Dietary Approaches to Stop Hypertension." The DASH eating plan is a healthy eating plan that has been shown to reduce high blood pressure (hypertension). It may also reduce your risk for type 2 diabetes, heart disease, and stroke. The DASH eating plan may also help with weight loss. What are tips for following this plan? General guidelines   Avoid eating more than 2,300 mg (milligrams) of salt (sodium) a day. If you have hypertension, you may need to reduce your sodium intake to 1,500 mg a day.  Limit alcohol intake to no more than 1 drink a day for nonpregnant women and 2 drinks a day for men. One drink equals 12 oz of beer, 5 oz of wine, or 1 oz of hard liquor.  Work with your health care provider to maintain a healthy body weight or to lose weight. Ask what an ideal weight is for you.  Get at least 30 minutes of exercise that causes your heart to beat faster (aerobic exercise) most days of the week. Activities may include walking, swimming, or biking.  Work with your health care provider or diet and nutrition specialist (dietitian) to adjust your eating plan to your individual calorie needs. Reading food labels   Check food labels for the amount of sodium per  serving. Choose foods with less than 5 percent of the Daily Value of sodium. Generally, foods with less than 300 mg of sodium per serving fit into this eating plan.  To find whole grains, look for the word "whole" as the first word in the ingredient list. Shopping   Buy products labeled as "low-sodium" or "no salt added."  Buy fresh foods. Avoid canned foods and premade or frozen meals. Cooking   Avoid adding salt when cooking. Use salt-free seasonings or herbs instead of table salt or sea salt. Check with your health care provider or pharmacist before using salt substitutes.  Do not fry foods. Cook foods using healthy methods such as baking, boiling, grilling, and broiling instead.  Cook with heart-healthy oils, such as olive, canola, soybean, or sunflower oil. Meal planning    Eat a balanced diet that includes:  5 or more servings of fruits and vegetables each day. At each meal, try to fill half of your plate with fruits and vegetables.  Up to 6-8 servings of whole grains each day.  Less than 6 oz of lean meat, poultry, or fish each day. A 3-oz serving of meat is about the same size as a deck of cards. One egg equals 1 oz.  2 servings of low-fat dairy each day.  A serving of nuts, seeds, or beans 5 times each week.  Heart-healthy fats. Healthy fats called Omega-3 fatty acids are found in foods such as flaxseeds and  coldwater fish, like sardines, salmon, and mackerel.  Limit how much you eat of the following:  Canned or prepackaged foods.  Food that is high in trans fat, such as fried foods.  Food that is high in saturated fat, such as fatty meat.  Sweets, desserts, sugary drinks, and other foods with added sugar.  Full-fat dairy products.  Do not salt foods before eating.  Try to eat at least 2 vegetarian meals each week.  Eat more home-cooked food and less restaurant, buffet, and fast food.  When eating at a restaurant, ask that your food be prepared with less  salt or no salt, if possible. What foods are recommended? The items listed may not be a complete list. Talk with your dietitian about what dietary choices are best for you. Grains  Whole-grain or whole-wheat bread. Whole-grain or whole-wheat pasta. Brown rice. [place]. Bulgur. Whole-grain and low-sodium cereals. Pita bread. Low-fat, low-sodium crackers. Whole-wheat flour tortillas. Vegetables  Fresh or frozen vegetables (raw, steamed, roasted, or grilled). Low-sodium or reduced-sodium tomato and vegetable juice. Low-sodium or reduced-sodium tomato sauce and tomato paste. Low-sodium or reduced-sodium canned vegetables. Fruits  All fresh, dried, or frozen fruit. Canned fruit in natural juice (without added sugar). Meat and other protein foods  Skinless chicken or [place]. Ground chicken or [place]. Pork with fat trimmed off. Fish and seafood. Egg whites. Dried beans, peas, or lentils. Unsalted nuts, nut butters, and seeds. Unsalted canned beans. Lean cuts of beef with fat trimmed off. Low-sodium, lean deli meat. Dairy  Low-fat (1%) or fat-free (skim) milk. Fat-free, low-fat, or reduced-fat cheeses. Nonfat, low-sodium ricotta or cottage cheese. Low-fat or nonfat yogurt. Low-fat, low-sodium cheese. Fats and oils  Soft margarine without trans fats. Vegetable oil. Low-fat, reduced-fat, or light mayonnaise and salad dressings (reduced-sodium). Canola, safflower, olive, soybean, and sunflower oils. Avocado. Seasoning and other foods  Herbs. Spices. Seasoning mixes without salt. Unsalted popcorn and pretzels. Fat-free sweets. What foods are not recommended? The items listed may not be a complete list. Talk with your dietitian about what dietary choices are best for you. Grains  Baked goods made with fat, such as croissants, muffins, or some breads. Dry pasta or rice meal packs. Vegetables  Creamed or fried vegetables. Vegetables in a cheese sauce. Regular canned vegetables (not low-sodium or  reduced-sodium). Regular canned tomato sauce and paste (not low-sodium or reduced-sodium). Regular tomato and vegetable juice (not low-sodium or reduced-sodium). Angie Fava. Olives. Fruits  Canned fruit in a light or heavy syrup. Fried fruit. Fruit in cream or butter sauce. Meat and other protein foods  Fatty cuts of meat. Ribs. Fried meat. Berniece Salines. Sausage. Bologna and other processed lunch meats. Salami. Fatback. Hotdogs. Bratwurst. Salted nuts and seeds. Canned beans with added salt. Canned or smoked fish. Whole eggs or egg yolks. Chicken or [place] with skin. Dairy  Whole or 2% milk, cream, and half-and-half. Whole or full-fat cream cheese. Whole-fat or sweetened yogurt. Full-fat cheese. Nondairy creamers. Whipped toppings. Processed cheese and cheese spreads. Fats and oils  Butter. Stick margarine. Lard. Shortening. Ghee. Bacon fat. Tropical oils, such as coconut, palm kernel, or palm oil. Seasoning and other foods  Salted popcorn and pretzels. Onion salt, garlic salt, seasoned salt, table salt, and sea salt. Worcestershire sauce. Tartar sauce. Barbecue sauce. Teriyaki sauce. Soy sauce, including reduced-sodium. Steak sauce. Canned and packaged gravies. Fish sauce. Oyster sauce. Cocktail sauce. Horseradish that you find on the shelf. Ketchup. Mustard. Meat flavorings and tenderizers. Bouillon cubes. Hot sauce and Tabasco sauce. Premade or  packaged marinades. Premade or packaged taco seasonings. Relishes. Regular salad dressings. Where to find more information:  National Heart, Lung, and [place]: https://wilson-eaton.com/  American Heart Association: www.heart.org Summary  The DASH eating plan is a healthy eating plan that has been shown to reduce high blood pressure (hypertension). It may also reduce your risk for type 2 diabetes, heart disease, and stroke.  With the DASH eating plan, you should limit salt (sodium) intake to 2,300 mg a day. If you have hypertension, you may need to reduce your  sodium intake to 1,500 mg a day.  When on the DASH eating plan, aim to eat more fresh fruits and vegetables, whole grains, lean proteins, low-fat dairy, and heart-healthy fats.  Work with your health care provider or diet and nutrition specialist (dietitian) to adjust your eating plan to your individual calorie needs. This information is not intended to replace advice given to you by your health care provider. Make sure you discuss any questions you have with your health care provider. Document Released: 10/20/2011 Document Revised: 10/24/2016 Document Reviewed: 10/24/2016 Elsevier Interactive Patient Education  2017 [employer].

## 2017-03-29 ENCOUNTER — Encounter: Payer: Self-pay | Admitting: Gynecology

## 2017-04-07 ENCOUNTER — Ambulatory Visit
Admission: RE | Admit: 2017-04-07 | Discharge: 2017-04-07 | Disposition: A | Discharge status: Home / Self Care | Payer: PPO | Source: Ambulatory Visit | Attending: Internal Medicine | Admitting: Internal Medicine

## 2017-04-07 DIAGNOSIS — Z1231 Encounter for screening mammogram for malignant neoplasm of breast: Secondary | ICD-10-CM | POA: Diagnosis not present

## 2017-04-08 ENCOUNTER — Encounter: Payer: Self-pay | Admitting: Family Medicine

## 2017-04-08 ENCOUNTER — Ambulatory Visit (INDEPENDENT_AMBULATORY_CARE_PROVIDER_SITE_OTHER): Payer: PPO | Admitting: Family Medicine

## 2017-04-08 VITALS — BP 128/80 | HR 82 | Temp 98.8°F | Resp 16 | Ht 64.0 in | Wt 192.0 lb

## 2017-04-08 DIAGNOSIS — R8290 Unspecified abnormal findings in urine: Secondary | ICD-10-CM

## 2017-04-08 LAB — POC URINALSYSI DIPSTICK (AUTOMATED)
BILIRUBIN UA: NEGATIVE
Blood, UA: NEGATIVE
GLUCOSE UA: NEGATIVE
Ketones, UA: NEGATIVE
LEUKOCYTES UA: NEGATIVE
NITRITE UA: NEGATIVE
Protein, UA: NEGATIVE
Spec Grav, UA: 1.015 (ref 1.010–1.025)
Urobilinogen, UA: 0.2 E.U./dL
pH, UA: 6 (ref 5.0–8.0)

## 2017-04-08 NOTE — Patient Instructions (Signed)
  [name] I have seen you today for an acute visit.  A few things to remember from today's visit:   Abnormal urine odor    Urine normal, no symptoms of UTI, so for now monitor.  Adequate fluid intake, caution with juices and follow with your pcp if needed.   In general please monitor for signs of worsening symptoms and seek immediate medical attention if any concerning.

## 2017-04-08 NOTE — Progress Notes (Signed)
HPI:   [name] is a 66 y.o. female, who is here today complaining of 1 day of "ammoniated" urine smell.  She has not identified exacerbating or alleviating factors.  Dysuria: Denies Urinary frequency: Denies Urinary urgency: Denies Incontinence: Denies Gross hematuria: Denies  Abdominal pain: No  Nausea or vomiting: No  Abnormal vaginal bleeding or discharge: No  ZYS:AYTK menopausal.  Hx of UTI: No  OTC medications for this problem: Cranberry juice.  She recently completed 7 days of Metronidazole to treat BV.   Review of Systems  Constitutional: Negative for activity change, appetite change, fatigue and fever.  Respiratory: Negative for shortness of breath.   Cardiovascular: Negative for leg swelling.  Gastrointestinal: Negative for abdominal pain, nausea and vomiting.       No changes in bowel habits.  Genitourinary: Negative for decreased urine volume, dysuria, flank pain, frequency, hematuria, urgency, vaginal bleeding and vaginal discharge.  Musculoskeletal: Negative for back pain and myalgias.  Neurological: Negative for syncope, weakness and headaches.  Psychiatric/Behavioral: Negative for confusion and sleep disturbance. The patient is nervous/anxious.     Current Outpatient Prescriptions on File Prior to Visit  Medication Sig Dispense Refill  . carboxymethylcellulose 1 % ophthalmic solution Apply 1 drop to eye daily.    . Cetirizine HCl (ZYRTEC ALLERGY PO) Take by mouth as needed. Reported on 12/14/2015    . Cholecalciferol (VITAMIN D3) 2000 UNITS capsule Take 2,000 Units by mouth daily.    Marland Kitchen Cod Liver Oil CAPS Take 1 capsule by mouth daily.    Marland Kitchen docusate sodium (COLACE) 100 MG capsule Take 100 mg by mouth 3 (three) times a week.     . fexofenadine (ALLEGRA) 180 MG tablet Take 180 mg by mouth daily. Reported on 12/14/2015    . GINKGO BILOBA PO Take 2 capsules by mouth daily.    Marland Kitchen loteprednol (LOTEMAX) 0.2 % SUSP Place 1 drop into both eyes  daily.    . meclizine (ANTIVERT) 12.5 MG tablet Take 1 tablet (12.5 mg total) by mouth 3 (three) times daily as needed for dizziness. 30 tablet 0  . Multiple Vitamin (MULTIVITAMIN) tablet Take 1 tablet by mouth daily.      . Olopatadine HCl (PATADAY) 0.2 % SOLN Apply 1 drop to eye daily.    . ranitidine (ZANTAC) 150 MG tablet Take 1 tablet (150 mg total) by mouth 2 (two) times daily. 60 tablet 0  . metroNIDAZOLE (FLAGYL) 500 MG tablet Take 1 tablet (500 mg total) by mouth 2 (two) times daily. (Patient not taking: Reported on 04/08/2017) 14 tablet 0   No current facility-administered medications on file prior to visit.      Past Medical History:  Diagnosis Date  . ALLERGIC RHINITIS 09/14/2006  . Fibroid   . Hx of adenomatous colonic polyps 07/28/2015  . Seasonal allergies    Allergies  Allergen Reactions  . Bee Venom Hives  . Prednisone Other (See Comments)    hallucinations    Social History   Social History  . Marital status: Single    Spouse name: N/A  . Number of children: N/A  . Years of education: N/A   Social History Main Topics  . Smoking status: Current Every Day Smoker    Packs/day: 0.25    Types: Cigarettes  . Smokeless tobacco: Never Used  . Alcohol use 0.0 oz/week     Comment: Rare  . Drug use: No  . Sexual activity: Yes    Birth control/ protection: Surgical  Comment: HYST   Other Topics Concern  . 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     Vitals:   04/08/17 1025  BP: 128/80  Pulse: 82  Resp: 16  Temp: 98.8 F (37.1 C)  O2 sat at RA 98% Body mass index is 32.96 kg/m.   Physical Exam  Nursing note and vitals reviewed. Constitutional: She is oriented to person, place, and time. She appears well-developed. No distress.  HENT:  Head: Atraumatic.  Mouth/Throat: Oropharynx is clear and moist and mucous membranes are normal.  Eyes: Conjunctivae and EOM are  normal.  Cardiovascular: Normal rate and regular rhythm.   Respiratory: Effort normal and breath sounds normal. No respiratory distress.  GI: Soft. She exhibits no mass. There is no tenderness. There is no CVA tenderness.  Musculoskeletal: She exhibits no edema or tenderness.  Lymphadenopathy:    She has no cervical adenopathy.  Neurological: She is alert and oriented to person, place, and time. She has normal strength. Gait normal.  Skin: Skin is warm. No erythema.  Psychiatric: Her mood appears anxious.  Well groomed, good eye contact.     ASSESSMENT AND PLAN:   Debra Woodward was seen today for dysuria.  Diagnoses and all orders for this visit:  Abnormal urine odor -     POCT Urinalysis Dipstick (Automated)   She does not have any other urinary symptom. Metronidazole could cause some urine color changes, uncommon to cause odor changes.  Urine dipstick today otherwise normal. Ucx is not indicated.  Monitor for new symptoms.   17 min face to face OV. > 50% was dedicated to discussion of differential Dx and some side effects of Metronidazole. She is very anxious about taking Flagyl and the possible long term effects, great deal of reassuring needed today. Encouraged to quit smoking, adverse effects of tobacco discussed. Recommended to follow if a new symptom present.     Drayven Marchena G. [place], MD  [facility]. [place] office.

## 2017-04-08 NOTE — Progress Notes (Signed)
Pre visit review using our clinic review tool, if applicable. No additional management support is needed unless otherwise documented below in the visit note. 

## 2017-04-20 DIAGNOSIS — M3501 Sicca syndrome with keratoconjunctivitis: Secondary | ICD-10-CM | POA: Diagnosis not present

## 2017-04-20 DIAGNOSIS — H40023 Open angle with borderline findings, high risk, bilateral: Secondary | ICD-10-CM | POA: Diagnosis not present

## 2017-04-20 DIAGNOSIS — H40033 Anatomical narrow angle, bilateral: Secondary | ICD-10-CM | POA: Diagnosis not present

## 2017-04-20 DIAGNOSIS — H1013 Acute atopic conjunctivitis, bilateral: Secondary | ICD-10-CM | POA: Diagnosis not present

## 2017-05-08 DIAGNOSIS — K1123 Chronic sialoadenitis: Secondary | ICD-10-CM | POA: Diagnosis not present

## 2017-05-15 NOTE — Progress Notes (Signed)
Chief Complaint  Patient presents with  . Follow-up    HPI: Debra Woodward 66 y.o. come in for  Fu o felevated bp readings  And working on [place] for control 133/81   May 31...     138/81      Seems to be ok  Otherwise     Has had vaginal odor and urine of    and [facility] and has begun probiotic   BP   readings  When checked have been "normal"          No fever   Walking some   Close friend having mh problems .   Stress but ok  Left inguinal pain without obv  Injury  Can walk ok no uti sx .  Some vaginal odor and on probiotics   womens to try.   ROS: See pertinent positives and negatives per HPI. No cp sob   Past Medical History:  Diagnosis Date  . ALLERGIC RHINITIS 09/14/2006  . Fibroid   . Hx of adenomatous colonic polyps 07/28/2015  . Seasonal allergies     Family History  Problem Relation Age of Onset  . Stroke Mother   . Diabetes Mother   . Diabetes Father   . Hypertension Sister   . Esophageal cancer Paternal Grandfather 64  . Colon cancer Neg Hx   . Stomach cancer Neg Hx   . Rectal cancer Neg Hx   . Breast cancer Neg Hx     Social History   Social History  . Marital status: Single    Spouse name: N/A  . Number of children: N/A  . Years of education: N/A   Social History Main Topics  . Smoking status: Current Every Day Smoker    Packs/day: 0.25    Types: Cigarettes  . Smokeless tobacco: Never Used  . Alcohol use 0.0 oz/week     Comment: Rare  . Drug use: No  . Sexual activity: Yes    Birth control/ protection: Surgical     Comment: HYST   Other Topics Concern  . 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     Outpatient Medications Prior to Visit  Medication Sig Dispense Refill  . carboxymethylcellulose 1 % ophthalmic solution Apply 1 drop to eye daily.    . Cetirizine HCl (ZYRTEC ALLERGY PO) Take by mouth as needed. Reported on 12/14/2015    .  Cholecalciferol (VITAMIN D3) 2000 UNITS capsule Take 2,000 Units by mouth daily.    Marland Kitchen Cod Liver Oil CAPS Take 1 capsule by mouth daily.    Marland Kitchen docusate sodium (COLACE) 100 MG capsule Take 100 mg by mouth 3 (three) times a week.     . fexofenadine (ALLEGRA) 180 MG tablet Take 180 mg by mouth daily. Reported on 12/14/2015    . GINKGO BILOBA PO Take 2 capsules by mouth daily.    Marland Kitchen loteprednol (LOTEMAX) 0.2 % SUSP Place 1 drop into both eyes daily.    . meclizine (ANTIVERT) 12.5 MG tablet Take 1 tablet (12.5 mg total) by mouth 3 (three) times daily as needed for dizziness. 30 tablet 0  . Multiple Vitamin (MULTIVITAMIN) tablet Take 1 tablet by mouth daily.      . Olopatadine HCl (PATADAY) 0.2 % SOLN Apply 1 drop to eye daily.    Marland Kitchen  ranitidine (ZANTAC) 150 MG tablet Take 1 tablet (150 mg total) by mouth 2 (two) times daily. 60 tablet 0  . metroNIDAZOLE (FLAGYL) 500 MG tablet Take 1 tablet (500 mg total) by mouth 2 (two) times daily. 14 tablet 0   No facility-administered medications prior to visit.      EXAM:  BP 140/80   Pulse 65   Temp 98 F (36.7 C) (Oral)   Wt 188 lb 3.2 oz (85.4 kg)   BMI 32.30 kg/m   Body mass index is 32.3 kg/m. Wt Readings from Last 3 Encounters:  05/16/17 188 lb 3.2 oz (85.4 kg)  04/08/17 192 lb (87.1 kg)  03/22/17 188 lb 11.2 oz (85.6 kg)    GENERAL: vitals reviewed and listed above, alert, oriented, appears well hydrated and in no acute distress HEENT: atraumatic, conjunctiva  clear, no obvious abnormalities on inspection of external nose and ears NECK: no obvious masses on inspection palpation  abd soft no g r masses  No groiun tnderness  rom nl hip.  Tender at medial  Tendon attachment  Nl gait.  CV: HRRR, no clubbing cyanosis or  peripheral edema nl cap refill  MS: moves all extremities without noticeable focal  abnormality PSYCH: pleasant and cooperative, no obvious depression or anxiety Lab Results  Component Value Date   WBC 5.6 03/08/2017   HGB  14.3 03/08/2017   HCT 42.1 03/08/2017   PLT 369.0 03/08/2017   GLUCOSE 120 (H) 03/08/2017   CHOL 162 03/08/2017   TRIG 124.0 03/08/2017   HDL 70.00 03/08/2017   LDLCALC 67 03/08/2017   ALT 13 03/08/2017   AST 14 03/08/2017   NA 144 03/08/2017   K 4.1 03/08/2017   CL 108 03/08/2017   CREATININE 0.79 03/08/2017   BUN 12 03/08/2017   CO2 30 03/08/2017   TSH 1.74 03/08/2017   INR 0.94 09/07/2010   HGBA1C 6.3 03/22/2017   BP Readings from Last 3 Encounters:  05/16/17 140/80  04/08/17 128/80  03/22/17 (!) 140/94    ASSESSMENT AND PLAN:  Discussed the following assessment and plan:  Essential hypertension - stage 1  pt prefers lsi at this time  Elevated blood pressure reading - early ht borderline  w new goals  dx counseled   Fasting hyperglycemia  Tobacco use disorder  Left inguinal pain - ua clear   seems ms groin strain  expectant managment  - Plan: POCT Urinalysis Dipstick (Automated)  -Patient advised to return or notify health care team  if  new concerns arise.  Patient Instructions  Work on getting blood pressure to the 120/80 range .   But can do lifestyle  For 130s over 80 range ...   If 140/90 range   Medication would be more helpful than harmful for you long term heart  Vascular health .    Plan Continue lifestyle intervention healthy eating and exercise .      Dash eating.   .     And monitor blood pressure as you are doing .   ROV   in 4-6 months to recheck bp and  Blood sugar issues.       [name]. [name]

## 2017-05-16 ENCOUNTER — Ambulatory Visit (INDEPENDENT_AMBULATORY_CARE_PROVIDER_SITE_OTHER): Payer: PPO | Admitting: Internal Medicine

## 2017-05-16 ENCOUNTER — Encounter: Payer: Self-pay | Admitting: Internal Medicine

## 2017-05-16 VITALS — BP 140/80 | HR 65 | Temp 98.0°F | Wt 188.2 lb

## 2017-05-16 DIAGNOSIS — R1032 Left lower quadrant pain: Secondary | ICD-10-CM

## 2017-05-16 DIAGNOSIS — R7301 Impaired fasting glucose: Secondary | ICD-10-CM

## 2017-05-16 DIAGNOSIS — R030 Elevated blood-pressure reading, without diagnosis of hypertension: Secondary | ICD-10-CM | POA: Diagnosis not present

## 2017-05-16 DIAGNOSIS — I10 Essential (primary) hypertension: Secondary | ICD-10-CM

## 2017-05-16 DIAGNOSIS — F17200 Nicotine dependence, unspecified, uncomplicated: Secondary | ICD-10-CM

## 2017-05-16 LAB — POC URINALSYSI DIPSTICK (AUTOMATED)
BILIRUBIN UA: NEGATIVE
GLUCOSE UA: NEGATIVE
Ketones, UA: NEGATIVE
Leukocytes, UA: NEGATIVE
Nitrite, UA: NEGATIVE
Protein, UA: NEGATIVE
RBC UA: NEGATIVE
Spec Grav, UA: >=1.03 — AB (ref 1.010–1.025)
UROBILINOGEN UA: 0.2 U/dL
pH, UA: 6 (ref 5.0–8.0)

## 2017-05-16 NOTE — Patient Instructions (Addendum)
Work on getting blood pressure to the 120/80 range .   But can do lifestyle  For 130s over 80 range ...   If 140/90 range   Medication would be more helpful than harmful for you long term heart  Vascular health .    Plan Continue lifestyle intervention healthy eating and exercise .      Dash eating.   .     And monitor blood pressure as you are doing .   ROV   in 4-6 months to recheck bp and  Blood sugar issues.

## 2017-07-27 ENCOUNTER — Ambulatory Visit (INDEPENDENT_AMBULATORY_CARE_PROVIDER_SITE_OTHER): Payer: PPO | Admitting: Podiatry

## 2017-07-27 ENCOUNTER — Ambulatory Visit: Payer: PPO | Admitting: Podiatry

## 2017-07-27 ENCOUNTER — Encounter: Payer: Self-pay | Admitting: Podiatry

## 2017-07-27 VITALS — BP 152/87 | HR 74 | Resp 16

## 2017-07-27 DIAGNOSIS — M79676 Pain in unspecified toe(s): Secondary | ICD-10-CM | POA: Diagnosis not present

## 2017-07-27 DIAGNOSIS — B351 Tinea unguium: Secondary | ICD-10-CM

## 2017-07-27 NOTE — Progress Notes (Signed)
Subjective:  Patient ID: Debra Woodward, female    DOB: 1951-03-13,  MRN: [id number] HPI Chief Complaint  Patient presents with  . Debridement    Patient is requesting nail care-unable to cut herself    66 y.o. female presents with the above complaint.   She presents today chief complaint of painful elongated toenails. States that she is currently using tea tree oil for the thickness.  Past Medical History:  Diagnosis Date  . ALLERGIC RHINITIS 09/14/2006  . Fibroid   . Hx of adenomatous colonic polyps 07/28/2015  . Seasonal allergies    Past Surgical History:  Procedure Laterality Date  . ABDOMINAL HYSTERECTOMY  1996  . BREAST EXCISIONAL BIOPSY Left 1983   No scar seen  . BREAST EXCISIONAL BIOPSY Right 1987   NO scar seen  . BREAST SURGERY Bilateral 1983, 1996   benign  . CARPAL TUNNEL RELEASE Right 2008  . COLONOSCOPY    . CYSTECTOMY Bilateral 1983, 1988   breast; benign  . KNEE ARTHROSCOPY Right 2010  . TOE SURGERY Bilateral   . TOTAL KNEE ARTHROPLASTY Right 2011    Current Outpatient Prescriptions:  .  carboxymethylcellulose 1 % ophthalmic solution, Apply 1 drop to eye daily., Disp: , Rfl:  .  Cetirizine HCl (ZYRTEC ALLERGY PO), Take by mouth as needed. Reported on 12/14/2015, Disp: , Rfl:  .  Cholecalciferol (VITAMIN D3) 2000 UNITS capsule, Take 2,000 Units by mouth daily., Disp: , Rfl:  .  Cod Liver Oil CAPS, Take 1 capsule by mouth daily., Disp: , Rfl:  .  docusate sodium (COLACE) 100 MG capsule, Take 100 mg by mouth 3 (three) times a week. , Disp: , Rfl:  .  fexofenadine (ALLEGRA) 180 MG tablet, Take 180 mg by mouth daily. Reported on 12/14/2015, Disp: , Rfl:  .  GINKGO BILOBA PO, Take 2 capsules by mouth daily., Disp: , Rfl:  .  loteprednol (LOTEMAX) 0.2 % SUSP, Place 1 drop into both eyes daily., Disp: , Rfl:  .  meclizine (ANTIVERT) 12.5 MG tablet, Take 1 tablet (12.5 mg total) by mouth 3 (three) times daily as needed for dizziness., Disp: 30 tablet, Rfl: 0 .   Multiple Vitamin (MULTIVITAMIN) tablet, Take 1 tablet by mouth daily.  , Disp: , Rfl:  .  Olopatadine HCl (PATADAY) 0.2 % SOLN, Apply 1 drop to eye daily., Disp: , Rfl:  .  Probiotic Product (PROBIOTIC PO), Take by mouth., Disp: , Rfl:  .  ranitidine (ZANTAC) 150 MG tablet, Take 1 tablet (150 mg total) by mouth 2 (two) times daily., Disp: 60 tablet, Rfl: 0 .  SHINGRIX injection, TO BE ADMINISTERED BY PHARMACIST FOR IMMUNIZATION, Disp: , Rfl: 1  Allergies  Allergen Reactions  . Bee Venom Hives  . Prednisone Other (See Comments)    hallucinations   Review of Systems  HENT: Positive for sinus pressure.   All other systems reviewed and are negative.  Objective:  There were no vitals filed for this visit. General AA&O x3. Patient is alert and oriented.  Vascular Dorsalis pedis and posterior tibial pulses 2/4 bilat. Brisk capillary refill to all digits. Pedal hair present.  Neurologic Epicritic sensation grossly intact.  Dermatologic No open lesions. Interspaces clear of maceration. Nails well groomed and normal in appearance.Toenails are thick yellow dystrophic onychomycotic.   Orthopedic: MMT 5/5 in dorsiflexion, plantarflexion, inversion, and eversion. Normal joint ROM without pain or crepitus.   Radiographs:  None taken today.  Assessment & Plan:   Assessment: Pain limb secondary to  onychomycosis 1 through 5 bilateral.  Plan: Debridement of toenails 1 through 5 bilateral.     Max T. Moody, [place]

## 2017-08-03 ENCOUNTER — Ambulatory Visit: Payer: PPO | Admitting: Podiatry

## 2017-08-23 DIAGNOSIS — M2392 Unspecified internal derangement of left knee: Secondary | ICD-10-CM | POA: Diagnosis not present

## 2017-08-23 DIAGNOSIS — M25562 Pain in left knee: Secondary | ICD-10-CM | POA: Diagnosis not present

## 2017-08-23 DIAGNOSIS — M1712 Unilateral primary osteoarthritis, left knee: Secondary | ICD-10-CM | POA: Diagnosis not present

## 2017-08-23 DIAGNOSIS — Z96651 Presence of right artificial knee joint: Secondary | ICD-10-CM | POA: Diagnosis not present

## 2017-09-13 DIAGNOSIS — H43391 Other vitreous opacities, right eye: Secondary | ICD-10-CM | POA: Diagnosis not present

## 2017-09-13 DIAGNOSIS — H43811 Vitreous degeneration, right eye: Secondary | ICD-10-CM | POA: Diagnosis not present

## 2017-09-13 DIAGNOSIS — H5319 Other subjective visual disturbances: Secondary | ICD-10-CM | POA: Diagnosis not present

## 2017-09-25 NOTE — Progress Notes (Signed)
Chief Complaint  Patient presents with  . Follow-up    No compaints with BP and blood sugars.     HPI: Debra Woodward 66 y.o. come in for Chronic disease management   Fu bp and blood sugar  Working hard on lsi and liosing weight  BP has been     120 range   lower than 130  vegges  And seafood no fried.  No biscuit   Now working in [employer] office and walking  And  Sleep at least 7 hours  Feels well  ROS: See pertinent positives and negatives per HPI.  Past Medical History:  Diagnosis Date  . ALLERGIC RHINITIS 09/14/2006  . Fibroid   . Hx of adenomatous colonic polyps 07/28/2015  . Seasonal allergies     Family History  Problem Relation Age of Onset  . Stroke Mother   . Diabetes Mother   . Diabetes Father   . Hypertension Sister   . Esophageal cancer Paternal Grandfather 23  . Colon cancer Neg Hx   . Stomach cancer Neg Hx   . Rectal cancer Neg Hx   . Breast cancer Neg Hx     Social History   Socioeconomic History  . Marital status: Single    Spouse name: None  . Number of children: None  . Years of education: None  . Highest education level: None  Social Needs  . Financial resource strain: None  . Food insecurity - worry: None  . Food insecurity - inability: None  . Transportation needs - medical: None  . Transportation needs - non-medical: None  Occupational History  . None  Tobacco Use  . Smoking status: Current Every Day Smoker    Packs/day: 0.25    Types: Cigarettes  . Smokeless tobacco: Never Used  . Tobacco comment: 4 cigs a day (09/28/17)  Substance and Sexual Activity  . Alcohol use: Yes    Alcohol/week: 0.0 oz    Comment: Rare  . Drug use: No  . Sexual activity: Yes    Birth control/protection: Surgical    Comment: HYST  Other Topics Concern  . 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     Outpatient Medications Prior to Visit  Medication Sig Dispense Refill   . carboxymethylcellulose 1 % ophthalmic solution Apply 1 drop to eye daily.    . Cetirizine HCl (ZYRTEC ALLERGY PO) Take by mouth as needed. Reported on 12/14/2015    . Cholecalciferol (VITAMIN D3) 2000 UNITS capsule Take 2,000 Units by mouth daily.    Marland Kitchen Cod Liver Oil CAPS Take 1 capsule by mouth daily.    Marland Kitchen docusate sodium (COLACE) 100 MG capsule Take 100 mg by mouth 3 (three) times a week.     . fexofenadine (ALLEGRA) 180 MG tablet Take 180 mg by mouth daily. Reported on 12/14/2015    . GINKGO BILOBA PO Take 2 capsules by mouth daily.    Marland Kitchen loteprednol (LOTEMAX) 0.2 % SUSP Place 1 drop into both eyes daily.    . meclizine (ANTIVERT) 12.5 MG tablet Take 1 tablet (12.5 mg total) by mouth 3 (three) times daily as needed for dizziness. 30 tablet 0  . Multiple Vitamin (MULTIVITAMIN) tablet Take 1 tablet by mouth daily.      . Olopatadine HCl (PATADAY) 0.2 % SOLN Apply 1 drop to  eye daily.    . Probiotic Product (PROBIOTIC PO) Take by mouth.    . ranitidine (ZANTAC) 150 MG tablet Take 1 tablet (150 mg total) by mouth 2 (two) times daily. 60 tablet 0  . SHINGRIX injection TO BE ADMINISTERED BY PHARMACIST FOR IMMUNIZATION  1   No facility-administered medications prior to visit.      EXAM:  BP 126/78 (BP Location: Right Arm, Patient Position: Sitting, Cuff Size: Normal)   Pulse 68   Temp 97.9 F (36.6 C) (Oral)   Wt 184 lb 12.8 oz (83.8 kg)   BMI 31.72 kg/m   Body mass index is 31.72 kg/m.  GENERAL: vitals reviewed and listed above, alert, oriented, appears well hydrated and in no acute distress HEENT: atraumatic, conjunctiva  clear, no obvious abnormalities on inspection of external nose and ears  NECK: no obvious masses on inspection palpation  LUNGS: clear to auscultation bilaterally, no wheezes, rales or rhonchi,  CV: HRRR, no clubbing cyanosis or  peripheral edema nl cap refill  MS: moves all extremities without noticeable focal  abnormality PSYCH: pleasant and cooperative, no  obvious depression or anxiety Lab Results  Component Value Date   WBC 5.6 03/08/2017   HGB 14.3 03/08/2017   HCT 42.1 03/08/2017   PLT 369.0 03/08/2017   GLUCOSE 120 (H) 03/08/2017   CHOL 162 03/08/2017   TRIG 124.0 03/08/2017   HDL 70.00 03/08/2017   LDLCALC 67 03/08/2017   ALT 13 03/08/2017   AST 14 03/08/2017   NA 144 03/08/2017   K 4.1 03/08/2017   CL 108 03/08/2017   CREATININE 0.79 03/08/2017   BUN 12 03/08/2017   CO2 30 03/08/2017   TSH 1.74 03/08/2017   INR 0.94 09/07/2010   HGBA1C 6.2 09/28/2017   BP Readings from Last 3 Encounters:  09/28/17 126/78  07/27/17 (!) 152/87  05/16/17 140/80   Wt Readings from Last 3 Encounters:  09/28/17 184 lb 12.8 oz (83.8 kg)  05/16/17 188 lb 3.2 oz (85.4 kg)  04/08/17 192 lb (87.1 kg)    ASSESSMENT AND PLAN:  Discussed the following assessment and plan:  Fasting hyperglycemia - stable improved continue  - Plan: POCT glucose (manual entry), POC HgB A1c  Elevated blood pressure reading - improved with lsi to continue  Happy Birth day  -Patient advised to return or notify health care team  if  new concerns arise.  Patient Instructions  Fasting blood sugar is normal now  Hg a1c still up but a bit better  Than last time  And stable   BP is better .  Continue lifestyle intervention healthy eating and exercise .  cpx and  Yearly visit   In May 2019         [name]. Jose Corvin M.D.

## 2017-09-28 ENCOUNTER — Encounter: Payer: Self-pay | Admitting: Internal Medicine

## 2017-09-28 ENCOUNTER — Ambulatory Visit: Payer: PPO | Admitting: Internal Medicine

## 2017-09-28 VITALS — BP 126/78 | HR 68 | Temp 97.9°F | Wt 184.8 lb

## 2017-09-28 DIAGNOSIS — R030 Elevated blood-pressure reading, without diagnosis of hypertension: Secondary | ICD-10-CM | POA: Diagnosis not present

## 2017-09-28 DIAGNOSIS — R7301 Impaired fasting glucose: Secondary | ICD-10-CM

## 2017-09-28 LAB — GLUCOSE, POCT (MANUAL RESULT ENTRY): POC GLUCOSE: 94 mg/dL (ref 70–99)

## 2017-09-28 LAB — POCT GLYCOSYLATED HEMOGLOBIN (HGB A1C): HEMOGLOBIN A1C: 6.2

## 2017-09-28 NOTE — Patient Instructions (Addendum)
Fasting blood sugar is normal now  Hg a1c still up but a bit better  Than last time  And stable   BP is better .  Continue lifestyle intervention healthy eating and exercise .  cpx and  Yearly visit   In May 2019

## 2017-10-06 ENCOUNTER — Ambulatory Visit: Payer: Self-pay | Admitting: *Deleted

## 2017-10-06 NOTE — Telephone Encounter (Signed)
   Reason for Disposition . Side (flank) or lower back pain present  Answer Assessment - Initial Assessment Questions 1. SYMPTOM: "What's the main symptom you're concerned about?" (e.g., frequency, incontinence)     Odor and color change with urination 2. ONSET: "When did the  ________  start?"     2-3 days ago 3. PAIN: "Is there any pain?" If so, ask: "How bad is it?" (Scale: 1-10; mild, moderate, severe)     Back pain- 7 4. CAUSE: "What do you think is causing the symptoms?"     Possible UTI 5. OTHER SYMPTOMS: "Do you have any other symptoms?" (e.g., fever, flank pain, blood in urine, pain with urination)     No other symptoms 6. PREGNANCY: "Is there any chance you are pregnant?" "When was your last menstrual period?"     n/a  Protocols used: URINARY [facility]

## 2017-10-07 ENCOUNTER — Ambulatory Visit: Payer: PPO | Admitting: Family Medicine

## 2017-10-07 ENCOUNTER — Encounter: Payer: Self-pay | Admitting: Family Medicine

## 2017-10-07 VITALS — BP 120/80 | HR 77 | Temp 98.6°F | Resp 16 | Ht 64.0 in | Wt 185.0 lb

## 2017-10-07 DIAGNOSIS — R82998 Other abnormal findings in urine: Secondary | ICD-10-CM

## 2017-10-07 LAB — POCT URINALYSIS DIPSTICK
Bilirubin, UA: NEGATIVE
Blood, UA: NEGATIVE
Glucose, UA: NEGATIVE
KETONES UA: NEGATIVE
LEUKOCYTES UA: NEGATIVE
Nitrite, UA: NEGATIVE
Protein, UA: NEGATIVE
Spec Grav, UA: 1.025 (ref 1.010–1.025)
UROBILINOGEN UA: 0.2 U/dL
pH, UA: 6 (ref 5.0–8.0)

## 2017-10-07 NOTE — Patient Instructions (Signed)
Call Monday for a non-fasting lab appointment. The orders have already been placed.  Continue to drink lots of fluids.   If anything shows up in your urine culture, we will reach out. Otherwise, no news is good news.  Let us know if anything changes.

## 2017-10-07 NOTE — Progress Notes (Signed)
Pre visit review using our clinic review tool, if applicable. No additional management support is needed unless otherwise documented below in the visit note. 

## 2017-10-07 NOTE — Progress Notes (Signed)
Chief Complaint  Patient presents with  . Urinary Frequency    and color change    Debra Woodward is a 66 y.o. F here for possible UTI.  Duration: 4 days. Symptoms: dark urine Denies: Pain, bleeding, frequency, frothing, abdominal pain, fevers, incontinence, vaginal discharge Hx of recurrent UTI? No Denies new sexual partners. She has had infrequent urinary tract infections, however the never presented like this  ROS:  Constitutional: denies fever GU: As noted in HPI  Past Medical History:  Diagnosis Date  . ALLERGIC RHINITIS 09/14/2006  . Fibroid   . Hx of adenomatous colonic polyps 07/28/2015  . Seasonal allergies   BP 120/80 (BP Location: Left Arm, Patient Position: Sitting, Cuff Size: Large)   Pulse 77   Temp 98.6 F (37 C) (Oral)   Resp 16   Ht 5\' 4"  (1.626 m)   Wt 185 lb (83.9 kg)   SpO2 99%   BMI 31.76 kg/m  General: Awake, alert, appears stated age HEENT: MMM Heart: RRR, no murmurs Lungs: CTAB, normal respiratory effort, no accessory muscle usage Abd: BS+, soft, NT, ND, no masses or organomegaly MSK: No CVA tenderness, neg Lloyd's sign Psych: Age appropriate judgment and insight  Dark urine - Plan: Basic metabolic panel, CBC, POCT Urinalysis Dipstick, Urine Culture  Will cx for thoroughness.  This does not sound infectious or related to exertion.  She was encouraged to stay well-hydrated. I would like to see her renal function.  Unfortunately our lab is not open today. Schedule lab appointment early next week.  She does not need to be fasting. The patient voiced understanding and agreement to the plan.  [place], DO 10/07/17 12:44 PM

## 2017-10-09 ENCOUNTER — Other Ambulatory Visit (INDEPENDENT_AMBULATORY_CARE_PROVIDER_SITE_OTHER): Payer: PPO

## 2017-10-09 DIAGNOSIS — R82998 Other abnormal findings in urine: Secondary | ICD-10-CM | POA: Diagnosis not present

## 2017-10-09 DIAGNOSIS — H6123 Impacted cerumen, bilateral: Secondary | ICD-10-CM | POA: Diagnosis not present

## 2017-10-09 LAB — CBC
HCT: 41 % (ref 36.0–46.0)
HEMOGLOBIN: 13.7 g/dL (ref 12.0–15.0)
MCHC: 33.5 g/dL (ref 30.0–36.0)
MCV: 95.7 fl (ref 78.0–100.0)
PLATELETS: 359 10*3/uL (ref 150.0–400.0)
RBC: 4.28 Mil/uL (ref 3.87–5.11)
RDW: 14.3 % (ref 11.5–15.5)
WBC: 5.1 10*3/uL (ref 4.0–10.5)

## 2017-10-09 LAB — BASIC METABOLIC PANEL
BUN: 8 mg/dL (ref 6–23)
CHLORIDE: 106 meq/L (ref 96–112)
CO2: 30 mEq/L (ref 19–32)
CREATININE: 0.68 mg/dL (ref 0.40–1.20)
Calcium: 9.7 mg/dL (ref 8.4–10.5)
GFR: 111.32 mL/min (ref >60.00)
GLUCOSE: 104 mg/dL — AB (ref 70–99)
POTASSIUM: 4.1 meq/L (ref 3.5–5.1)
Sodium: 142 mEq/L (ref 135–145)

## 2017-10-19 DIAGNOSIS — H25013 Cortical age-related cataract, bilateral: Secondary | ICD-10-CM | POA: Diagnosis not present

## 2017-10-19 DIAGNOSIS — H40023 Open angle with borderline findings, high risk, bilateral: Secondary | ICD-10-CM | POA: Diagnosis not present

## 2017-10-19 DIAGNOSIS — H43811 Vitreous degeneration, right eye: Secondary | ICD-10-CM | POA: Diagnosis not present

## 2017-10-19 DIAGNOSIS — H2513 Age-related nuclear cataract, bilateral: Secondary | ICD-10-CM | POA: Diagnosis not present

## 2017-10-19 DIAGNOSIS — H40033 Anatomical narrow angle, bilateral: Secondary | ICD-10-CM | POA: Diagnosis not present

## 2017-10-23 ENCOUNTER — Ambulatory Visit: Payer: PPO | Admitting: Podiatry

## 2017-10-30 ENCOUNTER — Ambulatory Visit: Payer: PPO | Admitting: Podiatry

## 2017-11-15 DIAGNOSIS — M238X2 Other internal derangements of left knee: Secondary | ICD-10-CM | POA: Diagnosis not present

## 2017-11-15 DIAGNOSIS — M170 Bilateral primary osteoarthritis of knee: Secondary | ICD-10-CM | POA: Insufficient documentation

## 2017-11-15 DIAGNOSIS — M25561 Pain in right knee: Secondary | ICD-10-CM | POA: Diagnosis not present

## 2017-11-15 DIAGNOSIS — Z5189 Encounter for other specified aftercare: Secondary | ICD-10-CM | POA: Insufficient documentation

## 2017-11-15 DIAGNOSIS — Z471 Aftercare following joint replacement surgery: Secondary | ICD-10-CM | POA: Diagnosis not present

## 2017-11-15 DIAGNOSIS — M25562 Pain in left knee: Secondary | ICD-10-CM | POA: Diagnosis not present

## 2017-11-20 ENCOUNTER — Ambulatory Visit: Payer: PPO | Admitting: Podiatry

## 2017-12-11 ENCOUNTER — Telehealth: Payer: Self-pay | Admitting: Internal Medicine

## 2017-12-11 ENCOUNTER — Ambulatory Visit: Payer: PPO | Admitting: Podiatry

## 2017-12-11 NOTE — Telephone Encounter (Signed)
Copied from [place] [id number]. Topic: Quick Communication - See Telephone Encounter >> Dec 11, 2017 10:06 AM Bea Graff, NT wrote: CRM for notification. See Telephone encounter for: Pt calling and states her pharmacy called to let her know her 2nd dose of the shingles vaccine is in now but she received the first dose back in May and now it has been 8 months ago. [facility] told her she was suppose to get the 2nd dosage within 2-6 months after her first and she doesn't know if she needs to if she needs to start all over and get the first dose again or if she is ok to go to get the 2nd shingles shot? She needs to let her pharmacy know by tomorrow.   12/11/17.

## 2017-12-11 NOTE — Telephone Encounter (Signed)
Spoke with pt and advised she should follow [facility] protocol for immunizations since she received first dose there as well. Advised pt that we have no data regarding receiving the injection late but she can discuss with [facility]. She states she will call them. Nothing further needed at this time.

## 2017-12-21 ENCOUNTER — Encounter: Payer: Self-pay | Admitting: Podiatry

## 2017-12-21 ENCOUNTER — Ambulatory Visit: Payer: PPO | Admitting: Podiatry

## 2017-12-21 DIAGNOSIS — B351 Tinea unguium: Secondary | ICD-10-CM

## 2017-12-21 DIAGNOSIS — M79676 Pain in unspecified toe(s): Secondary | ICD-10-CM | POA: Diagnosis not present

## 2017-12-24 NOTE — Progress Notes (Signed)
She presents today chief complaint of painful elongated toenails.  Objective: Vital signs are stable alert and oriented x3.  Toenails are long thick yellow dystrophic-like mycotic.  Pulses remain palpable.  Sharp incurvated nail margins exquisitely tender.  Assessment: Pain in limb secondary to onychomycosis sharp incurvated nail margins.  Plan: Toenails 1 through 5 bilateral cover service secondary to pain.  Follow-up with her on an as-needed basis.

## 2017-12-25 ENCOUNTER — Encounter: Payer: Self-pay | Admitting: Internal Medicine

## 2017-12-25 ENCOUNTER — Ambulatory Visit (INDEPENDENT_AMBULATORY_CARE_PROVIDER_SITE_OTHER): Payer: PPO | Admitting: Internal Medicine

## 2017-12-25 VITALS — BP 128/82 | HR 82 | Temp 98.1°F | Wt 191.6 lb

## 2017-12-25 DIAGNOSIS — Z7189 Other specified counseling: Secondary | ICD-10-CM | POA: Diagnosis not present

## 2017-12-25 DIAGNOSIS — R8290 Unspecified abnormal findings in urine: Secondary | ICD-10-CM | POA: Diagnosis not present

## 2017-12-25 DIAGNOSIS — Z7185 Encounter for immunization safety counseling: Secondary | ICD-10-CM

## 2017-12-25 LAB — POCT URINALYSIS DIPSTICK
Bilirubin, UA: NEGATIVE
Blood, UA: NEGATIVE
GLUCOSE UA: NEGATIVE
Ketones, UA: NEGATIVE
LEUKOCYTES UA: NEGATIVE
Nitrite, UA: NEGATIVE
Protein, UA: NEGATIVE
Spec Grav, UA: 1.02 (ref 1.010–1.025)
Urobilinogen, UA: 0.2 E.U./dL
pH, UA: 6 (ref 5.0–8.0)

## 2017-12-25 NOTE — Progress Notes (Signed)
Chief Complaint  Patient presents with  . Acute Visit    Pt states that she may have a UTI. Pt reports having a heavy ammonia smell to her urine 1 day ago. Denies urine discolor, no urgency, frequency, pain or discharge. Pt states that she is not sexually active.     HPI: Debra Woodward 67 y.o.  sda     Last week abnormal odor  For 1-2 days To urine .   Had an itch   But no vaginal sx  No urinay sx today and doing well but  concerna bout the  Previous odo  Is normal nolw  toe nail issue better on tea tree  Asks about vitamins  Advised  . To travel to [place]  Wedding in 4 months  Asks about immunizations etc  ROS: See pertinent positives and negatives per HPI.  Past Medical History:  Diagnosis Date  . ALLERGIC RHINITIS 09/14/2006  . Fibroid   . Hx of adenomatous colonic polyps 07/28/2015  . Seasonal allergies     Family History  Problem Relation Age of Onset  . Stroke Mother   . Diabetes Mother   . Diabetes Father   . Hypertension Sister   . Esophageal cancer Paternal Grandfather 54  . Colon cancer Neg Hx   . Stomach cancer Neg Hx   . Rectal cancer Neg Hx   . Breast cancer Neg Hx     Social History   Socioeconomic History  . Marital status: Single    Spouse name: None  . Number of children: None  . Years of education: None  . Highest education level: None  Social Needs  . Financial resource strain: None  . Food insecurity - worry: None  . Food insecurity - inability: None  . Transportation needs - medical: None  . Transportation needs - non-medical: None  Occupational History  . None  Tobacco Use  . Smoking status: Current Every Day Smoker    Packs/day: 0.25    Types: Cigarettes  . Smokeless tobacco: Never Used  . Tobacco comment: 4 cigs a day (09/28/17)  Substance and Sexual Activity  . Alcohol use: Yes    Alcohol/week: 0.0 oz    Comment: Rare  . Drug use: No  . Sexual activity: Yes    Birth control/protection: Surgical    Comment: HYST  Other Topics  Concern  . 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     Outpatient Medications Prior to Visit  Medication Sig Dispense Refill  . carboxymethylcellulose 1 % ophthalmic solution Apply 1 drop to eye daily.    . Cetirizine HCl (ZYRTEC ALLERGY PO) Take by mouth as needed. Reported on 12/14/2015    . Cholecalciferol (VITAMIN D3) 2000 UNITS capsule Take 2,000 Units by mouth daily.    Marland Kitchen Cod Liver Oil CAPS Take 1 capsule by mouth daily.    Marland Kitchen docusate sodium (COLACE) 100 MG capsule Take 100 mg by mouth 3 (three) times a week.     . fexofenadine (ALLEGRA) 180 MG tablet Take 180 mg by mouth daily. Reported on 12/14/2015    . GINKGO BILOBA PO Take 2 capsules by mouth daily.    Marland Kitchen loteprednol (LOTEMAX) 0.2 % SUSP Place 1 drop into both eyes daily.    . meclizine (ANTIVERT) 12.5 MG tablet Take 1 tablet (  12.5 mg total) by mouth 3 (three) times daily as needed for dizziness. 30 tablet 0  . Multiple Vitamin (MULTIVITAMIN) tablet Take 1 tablet by mouth daily.      . Olopatadine HCl (PATADAY) 0.2 % SOLN Apply 1 drop to eye daily.    . Probiotic Product (PROBIOTIC PO) Take by mouth.    . ranitidine (ZANTAC) 150 MG tablet Take 1 tablet (150 mg total) by mouth 2 (two) times daily. 60 tablet 0  . SHINGRIX injection TO BE ADMINISTERED BY PHARMACIST FOR IMMUNIZATION  1   No facility-administered medications prior to visit.      EXAM:  BP 128/82 (BP Location: Right Arm, Patient Position: Sitting, Cuff Size: Normal)   Pulse 82   Temp 98.1 F (36.7 C) (Oral)   Wt 191 lb 9.6 oz (86.9 kg)   BMI 32.89 kg/m   Body mass index is 32.89 kg/m.  GENERAL: vitals reviewed and listed above, alert, oriented, appears well hydrated and in no acute distress HEENT: atraumatic, conjunctiva  clear, no obvious abnormalities on inspection of external nose and ears PSYCH: pleasant and cooperative, no obvious depression or anxiety poct urine  normal  ASSESSMENT AND PLAN:  Discussed the following assessment and plan:  Abnormal urine odor - Plan: POC Urinalysis Dipstick  Immunization counseling No evidence of current infection  Disc vitamins  And supplements not   Robust  Evidence of working for nails but ok to take if she wishes but no extra iron.    immuniz disc  She has past hx of   resolved viral hepatitis    ? Which immunizations  -Patient advised to return or notify health care team  if symptoms worsen ,persist or new concerns arise. Total visit 58mins > 50% spent counseling and coordinating care as indicated in above note and in instructions to patient .   Patient Instructions  Urine test is normal today.   If not symptoms of uti then wouldn't use any medicine .    If fever abdomen.   Pain.   Let us know and we will recheck urine.   Advise update    Tetatnus vaccine .    And get the hepatitis A vaccine   I think you had the hepatitis B .      Already .             [name]. Romero Letizia M.D.

## 2017-12-25 NOTE — Patient Instructions (Addendum)
Urine test is normal today.   If not symptoms of uti then wouldn't use any medicine .    If fever abdomen.   Pain.   Let us know and we will recheck urine.   Advise update    Tetatnus vaccine .    And get the hepatitis A vaccine   I think you had the hepatitis B .      Already .

## 2018-01-08 ENCOUNTER — Telehealth: Payer: Self-pay | Admitting: Family Medicine

## 2018-01-08 NOTE — Telephone Encounter (Signed)
Copied from [place] [id number]. Topic: Inquiry >> Jan 08, 2018 11:17 AM Valla Leaver wrote: Reason for CRM: Patient would like a script/order for aTetanus and Hepatitis A vaccine to take to [place], [place] [place] [address] Phone: [phone] Fax: [phone]

## 2018-01-08 NOTE — Telephone Encounter (Signed)
Please advise Dr Regis Bill if okay, thanks.

## 2018-01-09 NOTE — Telephone Encounter (Signed)
Ok to do this     Dis she want  Hep b and a combo vaccine or just Hep a vaccine

## 2018-01-22 NOTE — Telephone Encounter (Signed)
Pt wants to go ahead and get the Hep A/B Combo (Twinrix) Pt advised of time frame/schedule vaccines will be given.  Pt to come pick up Rx tomorrow   Please advise Dr Sarajane Jews if you are okay with signing off on the vaccine Rx's in Dr Velora Mediate absence. Thanks.

## 2018-01-23 NOTE — Telephone Encounter (Signed)
Sent to Dr. Sarajane Jews

## 2018-01-23 NOTE — Telephone Encounter (Signed)
Will send to [place] to help as I am not in [place] office today.

## 2018-01-23 NOTE — Telephone Encounter (Signed)
Yes I will be happy to sign the vaccine order

## 2018-01-23 NOTE — Telephone Encounter (Signed)
Sent to PCP pt needs to get the combination for Hep A and Hep B and Td needs written orders. Sent to PCP

## 2018-01-23 NOTE — Telephone Encounter (Signed)
Sent to [place] to call pt placed up front for pick up.

## 2018-01-23 NOTE — Telephone Encounter (Signed)
rx is ready to pick up  

## 2018-01-23 NOTE — Telephone Encounter (Signed)
Pt aware that Rx for vaccines have been placed up front to be picked up.  Left detailed message on verified machine.  Nothing further needed.

## 2018-01-25 ENCOUNTER — Ambulatory Visit (INDEPENDENT_AMBULATORY_CARE_PROVIDER_SITE_OTHER): Payer: PPO | Admitting: Family Medicine

## 2018-01-25 DIAGNOSIS — Z23 Encounter for immunization: Secondary | ICD-10-CM

## 2018-02-27 ENCOUNTER — Telehealth: Payer: Self-pay | Admitting: Internal Medicine

## 2018-02-27 ENCOUNTER — Ambulatory Visit (INDEPENDENT_AMBULATORY_CARE_PROVIDER_SITE_OTHER): Payer: PPO | Admitting: Family Medicine

## 2018-02-27 DIAGNOSIS — Z23 Encounter for immunization: Secondary | ICD-10-CM | POA: Diagnosis not present

## 2018-02-27 NOTE — Telephone Encounter (Signed)
Copied from [place]. Topic: Quick Communication - See Telephone Encounter >> Feb 27, 2018  8:51 AM Robina Ade, Helene Kelp D wrote: CRM for notification. See Telephone encounter for: 02/27/18. Patient called and said that last week she called to check to see when she needs to come back for her second and third round of Hep A & B shots, but no one has called her back. Please call patient, thanks.

## 2018-02-27 NOTE — Telephone Encounter (Signed)
I spoke with pt and she is due now for 2nd injection, she will call office back to schedule a nurse visit.

## 2018-02-27 NOTE — Telephone Encounter (Signed)
Pt has been sch for 2nd injection

## 2018-03-22 ENCOUNTER — Ambulatory Visit: Payer: PPO | Admitting: Podiatry

## 2018-03-22 ENCOUNTER — Encounter: Payer: Self-pay | Admitting: Podiatry

## 2018-03-22 DIAGNOSIS — M79676 Pain in unspecified toe(s): Secondary | ICD-10-CM | POA: Diagnosis not present

## 2018-03-22 DIAGNOSIS — H40053 Ocular hypertension, bilateral: Secondary | ICD-10-CM | POA: Diagnosis not present

## 2018-03-22 DIAGNOSIS — B351 Tinea unguium: Secondary | ICD-10-CM | POA: Diagnosis not present

## 2018-03-22 DIAGNOSIS — H40023 Open angle with borderline findings, high risk, bilateral: Secondary | ICD-10-CM | POA: Diagnosis not present

## 2018-03-22 DIAGNOSIS — H00024 Hordeolum internum left upper eyelid: Secondary | ICD-10-CM | POA: Diagnosis not present

## 2018-03-22 NOTE — Progress Notes (Signed)
She presents today chief complaint of painful elongated toenails.  Objective: Toenails are thick yellow dystrophic-like mycotic no open lesions or wounds.  Assessment: Pain in limb secondary to onychomycosis.  Plan: Debridement of toenails 1 through 5 bilateral.

## 2018-03-27 DIAGNOSIS — H1013 Acute atopic conjunctivitis, bilateral: Secondary | ICD-10-CM | POA: Diagnosis not present

## 2018-03-27 DIAGNOSIS — H0289 Other specified disorders of eyelid: Secondary | ICD-10-CM | POA: Diagnosis not present

## 2018-03-27 DIAGNOSIS — H00024 Hordeolum internum left upper eyelid: Secondary | ICD-10-CM | POA: Diagnosis not present

## 2018-03-27 DIAGNOSIS — H40053 Ocular hypertension, bilateral: Secondary | ICD-10-CM | POA: Diagnosis not present

## 2018-04-04 DIAGNOSIS — H00024 Hordeolum internum left upper eyelid: Secondary | ICD-10-CM | POA: Diagnosis not present

## 2018-04-04 DIAGNOSIS — H40053 Ocular hypertension, bilateral: Secondary | ICD-10-CM | POA: Diagnosis not present

## 2018-04-04 DIAGNOSIS — H0289 Other specified disorders of eyelid: Secondary | ICD-10-CM | POA: Diagnosis not present

## 2018-04-04 DIAGNOSIS — H1013 Acute atopic conjunctivitis, bilateral: Secondary | ICD-10-CM | POA: Diagnosis not present

## 2018-04-19 DIAGNOSIS — H0289 Other specified disorders of eyelid: Secondary | ICD-10-CM | POA: Diagnosis not present

## 2018-04-19 DIAGNOSIS — H40023 Open angle with borderline findings, high risk, bilateral: Secondary | ICD-10-CM | POA: Diagnosis not present

## 2018-04-26 ENCOUNTER — Ambulatory Visit: Payer: PPO

## 2018-04-30 ENCOUNTER — Ambulatory Visit: Payer: PPO

## 2018-05-21 ENCOUNTER — Ambulatory Visit: Payer: Self-pay | Admitting: *Deleted

## 2018-05-21 NOTE — Telephone Encounter (Signed)
You could use the meclizine or also get some dramamine  Meant more for motion sickness  They can cause   Some sedation  As expected

## 2018-05-21 NOTE — Telephone Encounter (Signed)
  Reason for Disposition . Caller has medication question about med not prescribed by PCP and triager unable to answer question (e.g., compatibility with other med, storage)  Answer Assessment - Initial Assessment Questions 1. SYMPTOMS: "Do you have any symptoms?"     Patient has a history of dizziness/vertigo- she had Rx for meclizine and wants to know if Dr Regis Bill recommends that for use on cruise. Patient is going on cruise 05/30/18- told patient there are several products she can get- but if she is used to using that- then she can purchase that OTC. Will ask PCP if she feels something else would work better. 2. SEVERITY: If symptoms are present, ask "Are they mild, moderate or severe?"     Patient states she has been on [place] sea fishing [place] and not gotten sick before. So she wants something to have on hand.  Protocols used: MEDICATION QUESTION CALL-A-AH

## 2018-05-25 NOTE — Telephone Encounter (Signed)
I spoke with pt and she will check with pharmacy for over

## 2018-06-26 ENCOUNTER — Ambulatory Visit: Payer: PPO | Admitting: Podiatry

## 2018-07-05 ENCOUNTER — Encounter: Payer: Self-pay | Admitting: Podiatry

## 2018-07-05 ENCOUNTER — Ambulatory Visit: Payer: PPO | Admitting: Podiatry

## 2018-07-05 DIAGNOSIS — M79676 Pain in unspecified toe(s): Secondary | ICD-10-CM

## 2018-07-05 DIAGNOSIS — B351 Tinea unguium: Secondary | ICD-10-CM

## 2018-07-05 NOTE — Progress Notes (Signed)
She presents today chief complaint of painful elongated toenails 1 through 5 bilateral.  Objective: Toenails are long thick yellow dystrophic with mycotic.  Assessment: Pain left second onychomycosis.  Plan: Debridement of toenails 1 through 5 bilateral cover service secondary to pain.

## 2018-07-30 ENCOUNTER — Ambulatory Visit: Payer: PPO

## 2018-08-10 ENCOUNTER — Ambulatory Visit (INDEPENDENT_AMBULATORY_CARE_PROVIDER_SITE_OTHER): Payer: PPO | Admitting: *Deleted

## 2018-08-10 DIAGNOSIS — Z23 Encounter for immunization: Secondary | ICD-10-CM | POA: Diagnosis not present

## 2018-08-10 NOTE — Progress Notes (Signed)
Per orders of Dr. Regis Bill, injection of Twinrix and high dose flu given by Dorrene German. Patient tolerated injection well.

## 2018-08-21 NOTE — Progress Notes (Addendum)
Subjective:   Debra Woodward is a 67 y.o. female who presents for Medicare Annual (Subsequent) preventive examination.  Reports health as BMI 30  Substitutes 3 days a week   Diet Chol/hdl 2 A1c 6.2  Breakfast oatmeal in the am; lunch salad if available  Lunch if at home; sandwich, chips, no sodas, drinks water Supper always by 6pm   Exercise Works 3 days Walks around the home;  30 minutes of walking per pedometer   meds takes Vit D, biotin and women's multi-vitamin   Tobacco - Current every day smoker Working on it quitting smoking from last has decreased   Health Maintenance Due  Topic Date Due  . COLONOSCOPY  07/22/2018   Colonoscopy 07/2015 - 07/2018 - Dr. Carlean Purl  Mammogram 03/2017   Shingrix completed    Cardiac Risk Factors include: advanced age (>57men, >48 women)     Objective:     Vitals: BP (!) 150/80   Ht 5\' 5"  (1.651 m)   Wt 181 lb (82.1 kg)   BMI 30.12 kg/m   Body mass index is 30.12 kg/m.  Advanced Directives 08/22/2018 07/09/2015  Does Patient Have a Medical Advance Directive? Yes Yes  Type of Advance Directive - Living will;Healthcare Power of Attorney    Tobacco Social History   Tobacco Use  Smoking Status Current Every Day Smoker  . Packs/day: 0.25  . Types: Cigarettes  Smokeless Tobacco Never Used  Tobacco Comment   4 cigs a day (09/28/17)     Ready to quit: No Counseling given: Yes Comment: 4 cigs a day (09/28/17)   Clinical Intake:    Past Medical History:  Diagnosis Date  . ALLERGIC RHINITIS 09/14/2006  . Fibroid   . Hx of adenomatous colonic polyps 07/28/2015  . Seasonal allergies    Past Surgical History:  Procedure Laterality Date  . ABDOMINAL HYSTERECTOMY  1996  . BREAST EXCISIONAL BIOPSY Left 1983   No scar seen  . BREAST EXCISIONAL BIOPSY Right 1987   NO scar seen  . BREAST SURGERY Bilateral 1983, 1996   benign  . CARPAL TUNNEL RELEASE Right 2008  . COLONOSCOPY    . CYSTECTOMY Bilateral 1983, 1988   breast; benign  . KNEE ARTHROSCOPY Right 2010  . TOE SURGERY Bilateral   . TOTAL KNEE ARTHROPLASTY Right 2011   Family History  Problem Relation Age of Onset  . Stroke Mother   . Diabetes Mother   . Diabetes Father   . Hypertension Sister   . Esophageal cancer Paternal Grandfather 24  . Colon cancer Neg Hx   . Stomach cancer Neg Hx   . Rectal cancer Neg Hx   . Breast cancer Neg Hx    Social History   Socioeconomic History  . Marital status: Single    Spouse name: Not on file  . Number of children: Not on file  . Years of education: Not on file  . Highest education level: Not on file  Occupational History  . Not on file  Social Needs  . Financial resource strain: Not on file  . Food insecurity:    Worry: Not on file    Inability: Not on file  . Transportation needs:    Medical: Not on file    Non-medical: Not on file  Tobacco Use  . Smoking status: Current Every Day Smoker    Packs/day: 0.25    Types: Cigarettes  . Smokeless tobacco: Never Used  . Tobacco comment: 4 cigs a day (09/28/17)  Substance  and Sexual Activity  . Alcohol use: Yes    Alcohol/week: 0.0 standard drinks    Comment: Rare  . Drug use: No  . Sexual activity: Yes    Birth control/protection: Surgical    Comment: HYST  Lifestyle  . Physical activity:    Days per week: Not on file    Minutes per session: Not on file  . Stress: Not on file  Relationships  . Social connections:    Talks on phone: Not on file    Gets together: Not on file    Attends religious service: Not on file    Active member of club or organization: Not on file    Attends meetings of clubs or organizations: Not on file    Relationship status: Not on file  Other Topics Concern  . 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     Outpatient Encounter Medications as of 08/22/2018  Medication Sig  . carboxymethylcellulose 1 % ophthalmic  solution Apply 1 drop to eye daily.  . Cetirizine HCl (ZYRTEC ALLERGY PO) Take by mouth as needed. Reported on 12/14/2015  . Cholecalciferol (VITAMIN D3) 2000 UNITS capsule Take 2,000 Units by mouth daily.  Marland Kitchen Cod Liver Oil CAPS Take 1 capsule by mouth daily.  Marland Kitchen docusate sodium (COLACE) 100 MG capsule Take 100 mg by mouth 3 (three) times a week.   . fexofenadine (ALLEGRA) 180 MG tablet Take 180 mg by mouth daily. Reported on 12/14/2015  . fluticasone (FLONASE) 50 MCG/ACT nasal spray   . GINKGO BILOBA PO Take 2 capsules by mouth daily.  Marland Kitchen loteprednol (LOTEMAX) 0.2 % SUSP Place 1 drop into both eyes daily.  . meclizine (ANTIVERT) 12.5 MG tablet Take 1 tablet (12.5 mg total) by mouth 3 (three) times daily as needed for dizziness.  . Multiple Vitamin (MULTIVITAMIN) tablet Take 1 tablet by mouth daily.    . Olopatadine HCl (PATADAY) 0.2 % SOLN Apply 1 drop to eye daily.  . Probiotic Product (PROBIOTIC PO) Take by mouth.  . ranitidine (ZANTAC) 150 MG tablet Take 1 tablet (150 mg total) by mouth 2 (two) times daily. (Patient not taking: Reported on 08/22/2018)  . SHINGRIX injection TO BE ADMINISTERED BY PHARMACIST FOR IMMUNIZATION   No facility-administered encounter medications on file as of 08/22/2018.     Activities of Daily Living In your present state of health, do you have any difficulty performing the following activities: 08/22/2018  Hearing? N  Vision? N  Difficulty concentrating or making decisions? N  Walking or climbing stairs? N  Dressing or bathing? N  Doing errands, shopping? N  Preparing Food and eating ? N  Using the Toilet? N  In the past six months, have you accidently leaked urine? N  Do you have problems with loss of bowel control? N  Managing your Medications? N  Managing your Finances? N  Housekeeping or managing your Housekeeping? N  Some recent data might be hidden    Patient Care Team: Panosh, [name], MD as PCP - General (Internal Medicine) Huel Cote, NP as  Nurse Practitioner (Obstetrics and Gynecology)    Assessment:   This is a routine wellness examination for Debra Woodward.  Exercise Activities and Dietary recommendations Current Exercise Habits: Home exercise routine, Time (Minutes): 60(house work and other ), Frequency (Times/Week): 5, Weekly  Exercise (Minutes/Week): 300, Intensity: Moderate  Goals    . Weight (lb) < 170 lb (77.1 kg)     Will substitute other healthier snacks for nuts x 1 week and see how it goes        Fall Risk Fall Risk  08/22/2018 09/09/2016  Falls in the past year? No No     Depression Screen PHQ 2/9 Scores 08/22/2018 09/09/2016 05/20/2015  PHQ - 2 Score 0 0 0     Cognitive Function MMSE - Mini Mental State Exam 08/22/2018  Not completed: (No Data)     Ad8 score reviewed for issues:  Issues making decisions:  Less interest in hobbies / activities:  Repeats questions, stories (family complaining):  Trouble using ordinary gadgets (microwave, computer, phone):  Forgets the month or year:   Mismanaging finances:   Remembering appts:  Daily problems with thinking and/or memory: Ad8 score is=0        Immunization History  Administered Date(s) Administered  . Hep A / Hep B 01/25/2018, 02/27/2018, 08/10/2018  . Influenza Split 08/07/2012, 08/23/2014  . Influenza Whole 08/14/2001, 09/10/2007, 08/16/2010, 08/15/2011  . Influenza, High Dose Seasonal PF 08/23/2017, 08/10/2018  . Influenza,inj,Quad PF,6+ Mos 08/16/2016  . Influenza-Unspecified 08/14/2013  . Pneumococcal Conjugate-13 09/30/2016  . Pneumococcal Polysaccharide-23 11/15/2011  . Td 03/13/2009  . Tdap 01/25/2018  . Zoster 05/28/2012  . Zoster Recombinat (Shingrix) 03/22/2017, 12/11/2017     Screening Tests Health Maintenance  Topic Date Due  . COLONOSCOPY  07/22/2018  . PNA vac Low Risk Adult (2 of 2 - PPSV23) 09/22/2018 (Originally 09/30/2017)  . MAMMOGRAM  04/08/2019  . TETANUS/TDAP  01/26/2028  . INFLUENZA VACCINE  Completed   . DEXA SCAN  Completed  . Hepatitis C Screening  Completed         Plan:     PCP Notes   Health Maintenance Colonoscopy 07/2015 - 07/2018 - Dr. Carlean Purl  She will call Dr. Carlean Purl to discuss repeat  Mammogram 03/2017 - was told by [employer] they would not pay for Mammogram q year.   Postponed PSV 23 as she had Hep A-B and flu vaccine last week. 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 Medical and social history . Use of alcohol, tobacco or illicit drugs  . Current medications and supplements . Functional ability and status . Nutritional status . Physical activity . Advanced directives . List of other physicians . Hospitalizations, surgeries, and ER visits in previous 12 months . Vitals . Screenings to include cognitive, depression, and falls . Referrals and appointments  In addition, I have reviewed and discussed with patient certain preventive protocols, quality metrics, and best practice recommendations. A written personalized care plan for preventive services as well as general preventive health recommendations  were provided to patient.     KDXIP,JASNK, RN  08/22/2018   I have reviewed the documentation for the [facility] and [place] provided by the health coach and agree with their documentation.  I was immediately available for any questions  Eulas Post MD [facility]

## 2018-08-22 ENCOUNTER — Ambulatory Visit (INDEPENDENT_AMBULATORY_CARE_PROVIDER_SITE_OTHER): Payer: PPO

## 2018-08-22 VITALS — BP 150/80 | Ht 65.0 in | Wt 181.0 lb

## 2018-08-22 DIAGNOSIS — Z0000 Encounter for general adult medical examination without abnormal findings: Secondary | ICD-10-CM | POA: Diagnosis not present

## 2018-08-22 NOTE — Patient Instructions (Addendum)
Debra Woodward , Thank you for taking time to come for your Medicare Wellness Visit. I appreciate your ongoing commitment to your health goals. Please review the following plan we discussed and let me know if I can assist you in the future.   Will schedule your annual with Dr. Regis Bill after Rudene Anda this year   Will hold mammogram until 2020 due to Health team stated you can not have one.   You can take this when you see Dr. Regis Bill the next visit as you want to wait 4 weeks from your vaccinations you took last week before taking more vaccines The Centers for Disease Control are now recommending 2 pneumonia vaccinations after 67. The first is the Prevnar 13. This helps to boost your immunity to community acquired pneumonia as well as some protection from bacterial pneumonia  The 2nd is the pneumovax 23, which offers more broad protection!  Please consider taking these as this is your best protection against pneumonia.  You have been placed on a 3 year schedule for colonoscopy  By Dr. Carlean Purl Please call the office and check to see when your next one is due     These are the goals we discussed: Goals    . Weight (lb) < 170 lb (77.1 kg)     Will substitute other healthier snacks for nuts x 1 week and see how it goes        This is a list of the screening recommended for you and due dates:  Health Maintenance  Topic Date Due  . Pneumonia vaccines (2 of 2 - PPSV23) 09/30/2017  . Colon Cancer Screening  07/22/2018  . Mammogram  04/08/2019  . Tetanus Vaccine  01/26/2028  . Flu Shot  Completed  . DEXA scan (bone density measurement)  Completed  .  Hepatitis C: One time screening is recommended by Center for Disease Control  (CDC) for  adults born from [birth year] through 1965.   Completed      Screening for Type 2 Diabetes A screening test for type 2 diabetes (type 2 diabetes mellitus) is a blood test to measure your blood sugar (glucose) level. This test is done to check for early signs of  diabetes, before you develop symptoms. Type 2 diabetes is a long-term (chronic) disease that occurs when the pancreas does not make enough of a hormone called insulin. This results in high blood glucose levels, which can cause many complications. You may be screened for type 2 diabetes as part of your regular health care, especially if you have a high risk for diabetes. Screening can help identify type 2 diabetes at its early stage (prediabetes). Identifying and treating prediabetes may delay or prevent development of type 2 diabetes. What are the risk factors for type 2 diabetes? The following factors may make you more likely to develop type 2 diabetes:  Having a parent or sibling (first-degree relative) who has diabetes.  Being overweight or obese.  Being of American-Indian, [place], Hispanic, Latino, Asian, or African-American descent.  Not getting enough exercise.  Being older than 58.  Having a history of diabetes during pregnancy (gestational diabetes).  Having low levels of good cholesterol (HDL-C) or high levels of blood fats (triglycerides).  Having high blood glucose in a previous blood test.  Having high blood pressure.  Having certain diseases or conditions, including: ? Acanthosis nigricans. This is a condition that causes dark skin on the neck, armpits, and groin. ? Polycystic ovary syndrome (PCOS). ? Heart disease.  Having delivered a baby who weighed more than 9 lb (4.1 kg).  Who should be screened for type 2 diabetes? Adults  Adults age 10 and older. These adults should be screened at least once every three years.  Adults who are younger than 62, overweight, and have at least one other risk factor. These adults should be screened at least once every three years.  Adults who have normal blood glucose levels and two or more risk factors. These adults may be screened once every year (annually).  Women who have had gestational diabetes in the past. These  women should be screened at least once every three years.  Pregnant women who have risk factors. These women should be screened at their first prenatal visit.  Pregnant women with no risk factors. These women should be screened between weeks 24 and 28 of pregnancy. Children and adolescents  Children and adolescents should be screened for type 2 diabetes if they are overweight and have 2 of the following risk factors: ? A family history of type 2 diabetes. ? Being a member of a high risk race or ethnic group. ? Signs of insulin resistance or conditions associated with insulin resistance. ? A mother who had gestational diabetes while pregnant with him or her.  Screening should be done at least once every three years, starting at age 34. Your health care provider or your child's health care provider may recommend having a screening more or less often. What happens during screening? During screening, your health care provider may ask questions about:  Your health and your risk factors, including your activity level and any medical conditions that you have.  The health of your first-degree relatives.  Past pregnancies, if this applies.  Your health care provider will also do a physical exam, including a blood pressure measurement and blood tests. There are four blood tests that can be used to screen for type 2 diabetes. You may have one or more of the following:  A fasting plasma glucose test (FBG). You will not be allowed to eat for at least eight hours before a blood sample is taken.  A random blood glucose test. This test checks your blood glucose at any time of the day regardless of when you ate.  An oral glucose tolerance test (OGTT). This test measures your blood glucose at two times: ? After you have not eaten (have fasted) overnight. ? Two hours after you drink a glucose-containing beverage. A diagnosis can be made if the level is greater than 200 mg/dL.  An A1c test. This test  provides information about blood glucose control over the previous three months.  What do the results mean? Your test results are a measurement of how much glucose is in your blood. Normal blood glucose levels mean that you do not have diabetes or prediabetes. High blood glucose levels may mean that you have prediabetes or diabetes. Depending on the results, other tests may be needed to confirm the diagnosis. This information is not intended to replace advice given to you by your health care provider. Make sure you discuss any questions you have with your health care provider. Document Released: 08/27/2009 Document Revised: 04/07/2016 Document Reviewed: 08/28/2015 Elsevier Interactive Patient Education  2017 [place] Prevention in the Home Falls can cause injuries. They can happen to people of all ages. There are many things you can do to make your home safe and to help prevent falls. What can I do on the outside of  my home?  Regularly fix the edges of walkways and driveways and fix any cracks.  Remove anything that might make you trip as you walk through a door, such as a raised step or threshold.  Trim any bushes or trees on the path to your home.  Use bright outdoor lighting.  Clear any walking paths of anything that might make someone trip, such as rocks or tools.  Regularly check to see if handrails are loose or broken. Make sure that both sides of any steps have handrails.  Any raised decks and porches should have guardrails on the edges.  Have any leaves, snow, or ice cleared regularly.  Use sand or salt on walking paths during winter.  Clean up any spills in your garage right away. This includes oil or grease spills. What can I do in the bathroom?  Use night lights.  Install grab bars by the toilet and in the tub and shower. Do not use towel bars as grab bars.  Use non-skid mats or decals in the tub or shower.  If you need to sit down in the shower, use a  plastic, non-slip stool.  Keep the floor dry. Clean up any water that spills on the floor as soon as it happens.  Remove soap buildup in the tub or shower regularly.  Attach bath mats securely with double-sided non-slip rug tape.  Do not have throw rugs and other things on the floor that can make you trip. What can I do in the bedroom?  Use night lights.  Make sure that you have a light by your bed that is easy to reach.  Do not use any sheets or blankets that are too big for your bed. They should not hang down onto the floor.  Have a firm chair that has side arms. You can use this for support while you get dressed.  Do not have throw rugs and other things on the floor that can make you trip. What can I do in the kitchen?  Clean up any spills right away.  Avoid walking on wet floors.  Keep items that you use a lot in easy-to-reach places.  If you need to reach something above you, use a strong step stool that has a grab bar.  Keep electrical cords out of the way.  Do not use floor polish or wax that makes floors slippery. If you must use wax, use non-skid floor wax.  Do not have throw rugs and other things on the floor that can make you trip. What can I do with my stairs?  Do not leave any items on the stairs.  Make sure that there are handrails on both sides of the stairs and use them. Fix handrails that are broken or loose. Make sure that handrails are as long as the stairways.  Check any carpeting to make sure that it is firmly attached to the stairs. Fix any carpet that is loose or worn.  Avoid having throw rugs at the top or bottom of the stairs. If you do have throw rugs, attach them to the floor with carpet tape.  Make sure that you have a light switch at the top of the stairs and the bottom of the stairs. If you do not have them, ask someone to add them for you. What else can I do to help prevent falls?  Wear shoes that: ? Do not have high heels. ? Have  rubber bottoms. ? Are comfortable and fit you well. ?  Are closed at the toe. Do not wear sandals.  If you use a stepladder: ? Make sure that it is fully opened. Do not climb a closed stepladder. ? Make sure that both sides of the stepladder are locked into place. ? Ask someone to hold it for you, if possible.  Clearly mark and make sure that you can see: ? Any grab bars or handrails. ? First and last steps. ? Where the edge of each step is.  Use tools that help you move around (mobility aids) if they are needed. These include: ? Canes. ? Walkers. ? Scooters. ? Crutches.  Turn on the lights when you go into a dark area. Replace any light bulbs as soon as they burn out.  Set up your furniture so you have a clear path. Avoid moving your furniture around.  If any of your floors are uneven, fix them.  If there are any pets around you, be aware of where they are.  Review your medicines with your doctor. Some medicines can make you feel dizzy. This can increase your chance of falling. Ask your doctor what other things that you can do to help prevent falls. This information is not intended to replace advice given to you by your health care provider. Make sure you discuss any questions you have with your health care provider. Document Released: 08/27/2009 Document Revised: 04/07/2016 Document Reviewed: 12/05/2014 Elsevier Interactive Patient Education  2018 [place] Maintenance, Female Adopting a healthy lifestyle and getting preventive care can go a long way to promote health and wellness. Talk with your health care provider about what schedule of regular examinations is right for you. This is a good chance for you to check in with your provider about disease prevention and staying healthy. In between checkups, there are plenty of things you can do on your own. Experts have done a lot of research about which lifestyle changes and preventive measures are most likely to keep  you healthy. Ask your health care provider for more information. Weight and diet Eat a healthy diet  Be sure to include plenty of vegetables, fruits, low-fat dairy products, and lean protein.  Do not eat a lot of foods high in solid fats, added sugars, or salt.  Get regular exercise. This is one of the most important things you can do for your health. ? Most adults should exercise for at least 150 minutes each week. The exercise should increase your heart rate and make you sweat (moderate-intensity exercise). ? Most adults should also do strengthening exercises at least twice a week. This is in addition to the moderate-intensity exercise.  Maintain a healthy weight  Body mass index (BMI) is a measurement that can be used to identify possible weight problems. It estimates body fat based on height and weight. Your health care provider can help determine your BMI and help you achieve or maintain a healthy weight.  For females 21 years of age and older: ? A BMI below 18.5 is considered underweight. ? A BMI of 18.5 to 24.9 is normal. ? A BMI of 25 to 29.9 is considered overweight. ? A BMI of 30 and above is considered obese.  Watch levels of cholesterol and blood lipids  You should start having your blood tested for lipids and cholesterol at 67 years of age, then have this test every 5 years.  You may need to have your cholesterol levels checked more often if: ? Your lipid or cholesterol levels are high. ?  You are older than 67 years of age. ? You are at high risk for heart disease.  Cancer screening Lung Cancer  Lung cancer screening is recommended for adults 29-46 years old who are at high risk for lung cancer because of a history of smoking.  A yearly low-dose CT scan of the lungs is recommended for people who: ? Currently smoke. ? Have quit within the past 15 years. ? Have at least a 30-pack-year history of smoking. A pack year is smoking an average of one pack of cigarettes a  day for 1 year.  Yearly screening should continue until it has been 15 years since you quit.  Yearly screening should stop if you develop a health problem that would prevent you from having lung cancer treatment.  Breast Cancer  Practice breast self-awareness. This means understanding how your breasts normally appear and feel.  It also means doing regular breast self-exams. Let your health care provider know about any changes, no matter how small.  If you are in your 20s or 30s, you should have a clinical breast exam (CBE) by a health care provider every 1-3 years as part of a regular health exam.  If you are 53 or older, have a CBE every year. Also consider having a breast X-ray (mammogram) every year.  If you have a family history of breast cancer, talk to your health care provider about genetic screening.  If you are at high risk for breast cancer, talk to your health care provider about having an MRI and a mammogram every year.  Breast cancer gene (BRCA) assessment is recommended for women who have family members with BRCA-related cancers. BRCA-related cancers include: ? Breast. ? Ovarian. ? Tubal. ? Peritoneal cancers.  Results of the assessment will determine the need for genetic counseling and BRCA1 and BRCA2 testing.  Cervical Cancer Your health care provider may recommend that you be screened regularly for cancer of the pelvic organs (ovaries, uterus, and vagina). This screening involves a pelvic examination, including checking for microscopic changes to the surface of your cervix (Pap test). You may be encouraged to have this screening done every 3 years, beginning at age 52.  For women ages 42-65, health care providers may recommend pelvic exams and Pap testing every 3 years, or they may recommend the Pap and pelvic exam, combined with testing for human papilloma virus (HPV), every 5 years. Some types of HPV increase your risk of cervical cancer. Testing for HPV may also be  done on women of any age with unclear Pap test results.  Other health care providers may not recommend any screening for nonpregnant women who are considered low risk for pelvic cancer and who do not have symptoms. Ask your health care provider if a screening pelvic exam is right for you.  If you have had past treatment for cervical cancer or a condition that could lead to cancer, you need Pap tests and screening for cancer for at least 20 years after your treatment. If Pap tests have been discontinued, your risk factors (such as having a new sexual partner) need to be reassessed to determine if screening should resume. Some women have medical problems that increase the chance of getting cervical cancer. In these cases, your health care provider may recommend more frequent screening and Pap tests.  Colorectal Cancer  This type of cancer can be detected and often prevented.  Routine colorectal cancer screening usually begins at 67 years of age and continues through 67 years of  age.  Your health care provider may recommend screening at an earlier age if you have risk factors for colon cancer.  Your health care provider may also recommend using home test kits to check for hidden blood in the stool.  A small camera at the end of a tube can be used to examine your colon directly (sigmoidoscopy or colonoscopy). This is done to check for the earliest forms of colorectal cancer.  Routine screening usually begins at age 14.  Direct examination of the colon should be repeated every 5-10 years through 67 years of age. However, you may need to be screened more often if early forms of precancerous polyps or small growths are found.  Skin Cancer  Check your skin from head to toe regularly.  Tell your health care provider about any new moles or changes in moles, especially if there is a change in a mole's shape or color.  Also tell your health care provider if you have a mole that is larger than the  size of a pencil eraser.  Always use sunscreen. Apply sunscreen liberally and repeatedly throughout the day.  Protect yourself by wearing long sleeves, pants, a wide-brimmed hat, and sunglasses whenever you are outside.  Heart disease, diabetes, and high blood pressure  High blood pressure causes heart disease and increases the risk of stroke. High blood pressure is more likely to develop in: ? People who have blood pressure in the high end of the normal range (130-139/85-89 mm Hg). ? People who are overweight or obese. ? People who are African American.  If you are 59-33 years of age, have your blood pressure checked every 3-5 years. If you are 13 years of age or older, have your blood pressure checked every year. You should have your blood pressure measured twice-once when you are at a hospital or clinic, and once when you are not at a hospital or clinic. Record the average of the two measurements. To check your blood pressure when you are not at a hospital or clinic, you can use: ? An automated blood pressure machine at a pharmacy. ? A home blood pressure monitor.  If you are between 30 years and 70 years old, ask your health care provider if you should take aspirin to prevent strokes.  Have regular diabetes screenings. This involves taking a blood sample to check your fasting blood sugar level. ? If you are at a normal weight and have a low risk for diabetes, have this test once every three years after 67 years of age. ? If you are overweight and have a high risk for diabetes, consider being tested at a younger age or more often. Preventing infection Hepatitis B  If you have a higher risk for hepatitis B, you should be screened for this virus. You are considered at high risk for hepatitis B if: ? You were born in a country where hepatitis B is common. Ask your health care provider which countries are considered high risk. ? Your parents were born in a high-risk country, and you have  not been immunized against hepatitis B (hepatitis B vaccine). ? You have HIV or AIDS. ? You use needles to inject street drugs. ? You live with someone who has hepatitis B. ? You have had sex with someone who has hepatitis B. ? You get hemodialysis treatment. ? You take certain medicines for conditions, including cancer, organ transplantation, and autoimmune conditions.  Hepatitis C  Blood testing is recommended for: ? Everyone born from  1945 through 74. ? Anyone with known risk factors for hepatitis C.  Sexually transmitted infections (STIs)  You should be screened for sexually transmitted infections (STIs) including gonorrhea and chlamydia if: ? You are sexually active and are younger than 67 years of age. ? You are older than 67 years of age and your health care provider tells you that you are at risk for this type of infection. ? Your sexual activity has changed since you were last screened and you are at an increased risk for chlamydia or gonorrhea. Ask your health care provider if you are at risk.  If you do not have HIV, but are at risk, it may be recommended that you take a prescription medicine daily to prevent HIV infection. This is called pre-exposure prophylaxis (PrEP). You are considered at risk if: ? You are sexually active and do not regularly use condoms or know the HIV status of your partner(s). ? You take drugs by injection. ? You are sexually active with a partner who has HIV.  Talk with your health care provider about whether you are at high risk of being infected with HIV. If you choose to begin PrEP, you should first be tested for HIV. You should then be tested every 3 months for as long as you are taking PrEP. Pregnancy  If you are premenopausal and you may become pregnant, ask your health care provider about preconception counseling.  If you may become pregnant, take 400 to 800 micrograms (mcg) of folic acid every day.  If you want to prevent pregnancy, talk  to your health care provider about birth control (contraception). Osteoporosis and menopause  Osteoporosis is a disease in which the bones lose minerals and strength with aging. This can result in serious bone fractures. Your risk for osteoporosis can be identified using a bone density scan.  If you are 76 years of age or older, or if you are at risk for osteoporosis and fractures, ask your health care provider if you should be screened.  Ask your health care provider whether you should take a calcium or vitamin D supplement to lower your risk for osteoporosis.  Menopause may have certain physical symptoms and risks.  Hormone replacement therapy may reduce some of these symptoms and risks. Talk to your health care provider about whether hormone replacement therapy is right for you. Follow these instructions at home:  Schedule regular health, dental, and eye exams.  Stay current with your immunizations.  Do not use any tobacco products including cigarettes, chewing tobacco, or electronic cigarettes.  If you are pregnant, do not drink alcohol.  If you are breastfeeding, limit how much and how often you drink alcohol.  Limit alcohol intake to no more than 1 drink per day for nonpregnant women. One drink equals 12 ounces of beer, 5 ounces of wine, or 1 ounces of hard liquor.  Do not use street drugs.  Do not share needles.  Ask your health care provider for help if you need support or information about quitting drugs.  Tell your health care provider if you often feel depressed.  Tell your health care provider if you have ever been abused or do not feel safe at home. This information is not intended to replace advice given to you by your health care provider. Make sure you discuss any questions you have with your health care provider. Document Released: 05/16/2011 Document Revised: 04/07/2016 Document Reviewed: 08/04/2015 Elsevier Interactive Patient Education  [employer].

## 2018-09-17 NOTE — Patient Instructions (Addendum)
contact  GI dr Marla Roe office about  Colonscopy.   bp best 120/80 vbut below 140/90 acceptable.   continue stopping tobacco Will notify you  of labs when available.  If all ok then  cpx ina year.     Preventive Care 67 Years and Older, Female Preventive care refers to lifestyle choices and visits with your health care provider that can promote health and wellness. What does preventive care include?  A yearly physical exam. This is also called an annual well check.  Dental exams once or twice a year.  Routine eye exams. Ask your health care provider how often you should have your eyes checked.  Personal lifestyle choices, including: ? Daily care of your teeth and gums. ? Regular physical activity. ? Eating a healthy diet. ? Avoiding tobacco and drug use. ? Limiting alcohol use. ? Practicing safe sex. ? Taking low-dose aspirin every day. ? Taking vitamin and mineral supplements as recommended by your health care provider. What happens during an annual well check? The services and screenings done by your health care provider during your annual well check will depend on your age, overall health, lifestyle risk factors, and family history of disease. Counseling Your health care provider may ask you questions about your:  Alcohol use.  Tobacco use.  Drug use.  Emotional well-being.  Home and relationship well-being.  Sexual activity.  Eating habits.  History of falls.  Memory and ability to understand (cognition).  Work and work Statistician.  Reproductive health.  Screening You may have the following tests or measurements:  Height, weight, and BMI.  Blood pressure.  Lipid and cholesterol levels. These may be checked every 5 years, or more frequently if you are over 35 years old.  Skin check.  Lung cancer screening. You may have this screening every year starting at age 22 if you have a 30-pack-year history of smoking and currently smoke or have quit  within the past 15 years.  Fecal occult blood test (FOBT) of the stool. You may have this test every year starting at age 47.  Flexible sigmoidoscopy or colonoscopy. You may have a sigmoidoscopy every 5 years or a colonoscopy every 10 years starting at age 35.  Hepatitis C blood test.  Hepatitis B blood test.  Sexually transmitted disease (STD) testing.  Diabetes screening. This is done by checking your blood sugar (glucose) after you have not eaten for a while (fasting). You may have this done every 1-3 years.  Bone density scan. This is done to screen for osteoporosis. You may have this done starting at age 79.  Mammogram. This may be done every 1-2 years. Talk to your health care provider about how often you should have regular mammograms.  Talk with your health care provider about your test results, treatment options, and if necessary, the need for more tests. Vaccines Your health care provider may recommend certain vaccines, such as:  Influenza vaccine. This is recommended every year.  Tetanus, diphtheria, and acellular pertussis (Tdap, Td) vaccine. You may need a Td booster every 10 years.  Varicella vaccine. You may need this if you have not been vaccinated.  Zoster vaccine. You may need this after age 82.  Measles, mumps, and rubella (MMR) vaccine. You may need at least one dose of MMR if you were born in 1957 or later. You may also need a second dose.  Pneumococcal 13-valent conjugate (PCV13) vaccine. One dose is recommended after age 69.  Pneumococcal polysaccharide (PPSV23) vaccine. One dose is  recommended after age 80.  Meningococcal vaccine. You may need this if you have certain conditions.  Hepatitis A vaccine. You may need this if you have certain conditions or if you travel or work in places where you may be exposed to hepatitis A.  Hepatitis B vaccine. You may need this if you have certain conditions or if you travel or work in places where you may be exposed  to hepatitis B.  Haemophilus influenzae type b (Hib) vaccine. You may need this if you have certain conditions.  Talk to your health care provider about which screenings and vaccines you need and how often you need them. This information is not intended to replace advice given to you by your health care provider. Make sure you discuss any questions you have with your health care provider. Document Released: 11/27/2015 Document Revised: 07/20/2016 Document Reviewed: 09/01/2015 Elsevier Interactive Patient Education  [employer].

## 2018-09-17 NOTE — Progress Notes (Signed)
Chief Complaint  Patient presents with  . Annual Exam    skin tag, vaginal area, pt states that it rubs,     HPI: Debra Woodward 67 y.o. comes in today for Preventive Medicare exam/ wellness visit .Since last visit. Doing well  Down on tobacco   Check skin tag perineal area   To get mammogram   In may cause  HT advantage  Pays only every 2 years  try8nig otopstay acitve    Health Maintenance  Topic Date Due  . COLONOSCOPY  07/22/2018  . PNA vac Low Risk Adult (2 of 2 - PPSV23) 09/22/2018 (Originally 09/30/2017)  . MAMMOGRAM  04/08/2019  . TETANUS/TDAP  01/26/2028  . INFLUENZA VACCINE  Completed  . DEXA SCAN  Completed  . Hepatitis C Screening  Completed   Health Maintenance Review LIFESTYLE:  Exercise:   Per phone app  Sub in schools  Tobacco/ETS:  Slight 1 pk per 8 days .  Alcohol:  Social  One a week  Sugar beverages:99 5 water  Sleep: Drug use: no HH: 1 no pets    Hearing: ok  Vision:  No limitations at present . Last eye check UTD  Safety:  Has smoke detector and wears seat belts.  No firearms. No excess sun exposure. Sees dentist regularly.  Falls: n  ADLS:   There are no problems or need for assistance  driving, feeding, obtaining food, dressing, toileting and bathing, managing money using phone. She is independent. work in the schools part time    ROS:  GEN/ HEENT: No fever, significant weight changes sweats headaches vision problems hearing changes, CV/ PULM; No chest pain shortness of breath cough, syncope,edema  change in exercise tolerance. GI /GU: No adominal pain, vomiting, change in bowel habits. No blood in the stool. No significant GU symptoms. SKIN/HEME: ,no acute skin rashes suspicious lesions or bleeding. No lymphadenopathy, nodules, masses.  See above  NEURO/ PSYCH:  No neurologic signs such as weakness numbness. No depression anxiety. IMM/ Allergy: No unusual infections.  Allergy .   REST of 12 system review negative except as per  HPI   Past Medical History:  Diagnosis Date  . ALLERGIC RHINITIS 09/14/2006  . Fibroid   . Hx of adenomatous colonic polyps 07/28/2015  . Seasonal allergies     Family History  Problem Relation Age of Onset  . Stroke Mother   . Diabetes Mother   . Diabetes Father   . Hypertension Sister   . Esophageal cancer Paternal Grandfather 10  . Colon cancer Neg Hx   . Stomach cancer Neg Hx   . Rectal cancer Neg Hx   . Breast cancer Neg Hx     Social History   Socioeconomic History  . Marital status: Single    Spouse name: Not on file  . Number of children: Not on file  . Years of education: Not on file  . Highest education level: Not on file  Occupational History  . Not on file  Social Needs  . Financial resource strain: Not on file  . Food insecurity:    Worry: Not on file    Inability: Not on file  . Transportation needs:    Medical: Not on file    Non-medical: Not on file  Tobacco Use  . Smoking status: Current Every Day Smoker    Packs/day: 0.25    Types: Cigarettes  . Smokeless tobacco: Never Used  . Tobacco comment: 4 cigs a day (09/28/17)  Substance and  Sexual Activity  . Alcohol use: Yes    Alcohol/week: 0.0 standard drinks    Comment: Rare  . Drug use: No  . Sexual activity: Yes    Birth control/protection: Surgical    Comment: HYST  Lifestyle  . Physical activity:    Days per week: Not on file    Minutes per session: Not on file  . Stress: Not on file  Relationships  . Social connections:    Talks on phone: Not on file    Gets together: Not on file    Attends religious service: Not on file    Active member of club or organization: Not on file    Attends meetings of clubs or organizations: Not on file    Relationship status: Not on file  Other Topics Concern  . 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     Outpatient Encounter Medications as of 09/18/2018   Medication Sig  . carboxymethylcellulose 1 % ophthalmic solution Apply 1 drop to eye daily.  . Cetirizine HCl (ZYRTEC ALLERGY PO) Take by mouth as needed. Reported on 12/14/2015  . Cholecalciferol (VITAMIN D3) 2000 UNITS capsule Take 2,000 Units by mouth daily.  Marland Kitchen Cod Liver Oil CAPS Take 1 capsule by mouth daily.  Marland Kitchen docusate sodium (COLACE) 100 MG capsule Take 100 mg by mouth 3 (three) times a week.   . fexofenadine (ALLEGRA) 180 MG tablet Take 180 mg by mouth daily. Reported on 12/14/2015  . fluticasone (FLONASE) 50 MCG/ACT nasal spray   . GINKGO BILOBA PO Take 2 capsules by mouth daily.  Marland Kitchen loteprednol (LOTEMAX) 0.2 % SUSP Place 1 drop into both eyes daily.  . Multiple Vitamin (MULTIVITAMIN) tablet Take 1 tablet by mouth daily.    . Olopatadine HCl (PATADAY) 0.2 % SOLN Apply 1 drop to eye daily.  . Probiotic Product (PROBIOTIC PO) Take by mouth.  . ranitidine (ZANTAC) 150 MG tablet Take 1 tablet (150 mg total) by mouth 2 (two) times daily.  Marland Kitchen SHINGRIX injection TO BE ADMINISTERED BY PHARMACIST FOR IMMUNIZATION  . [DISCONTINUED] meclizine (ANTIVERT) 12.5 MG tablet Take 1 tablet (12.5 mg total) by mouth 3 (three) times daily as needed for dizziness.   No facility-administered encounter medications on file as of 09/18/2018.     EXAM:  BP 138/82 (BP Location: Left Arm, Patient Position: Sitting, Cuff Size: Normal)   Pulse (!) 51   Temp 98.1 F (36.7 C) (Oral)   Ht 5' 5.25" (1.657 m)   Wt 182 lb 3.2 oz (82.6 kg)   SpO2 98%   BMI 30.09 kg/m   Body mass index is 30.09 kg/m.  Physical Exam: Vital signs reviewed [id number] is a well-developed well-nourished alert cooperative   who appears stated age in no acute distress.  HEENT: normocephalic atraumatic , Eyes: PERRL EOM's full, conjunctiva clearglasses , Nares: paten,t no deformity discharge or tenderness., Ears: no deformity EAC's clear TMs with normal landmarks. Mouth: clear OP, no lesions, edema.  Moist mucous membranes. Dentition in  adequate repair. NECK: supple without masses, thyromegaly or bruits. CHEST/PULM:  Clear to auscultation and percussion breath sounds equal no wheeze , rales or rhonchi. No chest wall deformities or tenderness. CV: PMI is nondisplaced, S1 S2 no gallops, murmurs, rubs. Peripheral pulses are full without delay.No JVD .  ABDOMEN: Bowel sounds normal  nontender  No guard or rebound, no hepato splenomegal no CVA tenderness.   Extremtities:  No clubbing cyanosis or edema, no acute joint swelling or redness no focal atrophy NEURO:  Oriented x3, cranial nerves 3-12 appear to be intact, no obvious focal weakness,gait within normal limits no abnormal reflexes or asymmetrical Ext gy nk  2 small skin tag like areas lower perineum about 2 mm and benign  Not irrutated  SKIN: No acute rashes normal turgor, color, no bruising or petechiae. PSYCH: Oriented, good eye contact, no obvious depression anxiety, cognition and judgment appear normal. LN: no cervical axillary inguinal adenopathy No noted deficits in memory, attention, and speech.   Lab Results  Component Value Date   WBC 5.1 10/09/2017   HGB 13.7 10/09/2017   HCT 41.0 10/09/2017   PLT 359.0 10/09/2017   GLUCOSE 104 (H) 10/09/2017   CHOL 162 03/08/2017   TRIG 124.0 03/08/2017   HDL 70.00 03/08/2017   LDLCALC 67 03/08/2017   ALT 13 03/08/2017   AST 14 03/08/2017   NA 142 10/09/2017   K 4.1 10/09/2017   CL 106 10/09/2017   CREATININE 0.68 10/09/2017   BUN 8 10/09/2017   CO2 30 10/09/2017   TSH 1.74 03/08/2017   INR 0.94 09/07/2010   HGBA1C 6.2 09/28/2017    ASSESSMENT AND PLAN:  Discussed the following assessment and plan:  Visit for preventive health examination  Medication management - Plan: Lipid panel, Hemoglobin O8T, Basic metabolic panel  Degenerative disc disease, lumbar sacral  Hx of adenomatous colonic polyps  Need for pneumococcal vaccination - Plan: Pneumococcal polysaccharide vaccine 23-valent greater than or equal to  2yo subcutaneous/IM  Hyperglycemia - Plan: Lipid panel, Hemoglobin G5Q, Basic metabolic panel  Screening, lipid - Plan: Lipid panel, Hemoglobin D8Y, Basic metabolic panel  Tobacco use disorder   Not a candidate for lung cancer screening  Disc  Cont  Monitor bp for optimum and can add medication if needed  To control but repeat bp today was at goal  Reviewed   Screening  Follow bg and lipids etc  For cv health  Would leave the  Skin tag alone based on exam  Patient Care Team: Margaretmary Prisk, [name], MD as PCP - General (Internal Medicine) Huel Cote, NP as Nurse Practitioner (Obstetrics and Gynecology)  Patient Instructions   contact  GI dr Marla Roe office about  Colonscopy.   bp best 120/80 vbut below 140/90 acceptable.   continue stopping tobacco Will notify you  of labs when available.  If all ok then  cpx ina year.     Preventive Care 65 Years and Older, Female Preventive care refers to lifestyle choices and visits with your health care provider that can promote health and wellness. What does preventive care include?  A yearly physical exam. This is also called an annual well check.  Dental exams once or twice a year.  Routine eye exams. Ask your health care provider how often you should have your eyes checked.  Personal lifestyle choices, including: ? Daily care of your teeth and gums. ? Regular physical activity. ? Eating a healthy diet. ? Avoiding tobacco and drug use. ? Limiting alcohol use. ? Practicing safe sex. ? Taking low-dose aspirin every day. ? Taking vitamin and mineral supplements as recommended by your health care provider. What happens during an annual well check? The services and screenings done by your health care provider during your annual well check will depend on your age, overall health, lifestyle risk factors,  and family history of disease. Counseling Your health care provider may ask you questions about your:  Alcohol use.  Tobacco  use.  Drug use.  Emotional well-being.  Home and relationship well-being.  Sexual activity.  Eating habits.  History of falls.  Memory and ability to understand (cognition).  Work and work Statistician.  Reproductive health.  Screening You may have the following tests or measurements:  Height, weight, and BMI.  Blood pressure.  Lipid and cholesterol levels. These may be checked every 5 years, or more frequently if you are over 36 years old.  Skin check.  Lung cancer screening. You may have this screening every year starting at age 17 if you have a 30-pack-year history of smoking and currently smoke or have quit within the past 15 years.  Fecal occult blood test (FOBT) of the stool. You may have this test every year starting at age 15.  Flexible sigmoidoscopy or colonoscopy. You may have a sigmoidoscopy every 5 years or a colonoscopy every 10 years starting at age 40.  Hepatitis C blood test.  Hepatitis B blood test.  Sexually transmitted disease (STD) testing.  Diabetes screening. This is done by checking your blood sugar (glucose) after you have not eaten for a while (fasting). You may have this done every 1-3 years.  Bone density scan. This is done to screen for osteoporosis. You may have this done starting at age 18.  Mammogram. This may be done every 1-2 years. Talk to your health care provider about how often you should have regular mammograms.  Talk with your health care provider about your test results, treatment options, and if necessary, the need for more tests. Vaccines Your health care provider may recommend certain vaccines, such as:  Influenza vaccine. This is recommended every year.  Tetanus, diphtheria, and acellular pertussis (Tdap, Td) vaccine. You may need a Td booster every 10 years.  Varicella vaccine. You may need this if you have not been vaccinated.  Zoster vaccine. You may need this after age 33.  Measles, mumps, and rubella (MMR)  vaccine. You may need at least one dose of MMR if you were born in 1957 or later. You may also need a second dose.  Pneumococcal 13-valent conjugate (PCV13) vaccine. One dose is recommended after age 36.  Pneumococcal polysaccharide (PPSV23) vaccine. One dose is recommended after age 27.  Meningococcal vaccine. You may need this if you have certain conditions.  Hepatitis A vaccine. You may need this if you have certain conditions or if you travel or work in places where you may be exposed to hepatitis A.  Hepatitis B vaccine. You may need this if you have certain conditions or if you travel or work in places where you may be exposed to hepatitis B.  Haemophilus influenzae type b (Hib) vaccine. You may need this if you have certain conditions.  Talk to your health care provider about which screenings and vaccines you need and how often you need them. This information is not intended to replace advice given to you by your health care provider. Make sure you discuss any questions you have with your health care provider. Document Released: 11/27/2015 Document Revised: 07/20/2016 Document Reviewed: 09/01/2015 Elsevier Interactive Patient Education  2018 [employer]. Mily Malecki M.D.

## 2018-09-18 ENCOUNTER — Encounter: Payer: Self-pay | Admitting: Internal Medicine

## 2018-09-18 ENCOUNTER — Ambulatory Visit (INDEPENDENT_AMBULATORY_CARE_PROVIDER_SITE_OTHER): Payer: PPO | Admitting: Internal Medicine

## 2018-09-18 ENCOUNTER — Other Ambulatory Visit: Payer: Self-pay | Admitting: Internal Medicine

## 2018-09-18 VITALS — BP 138/82 | HR 51 | Temp 98.1°F | Ht 65.25 in | Wt 182.2 lb

## 2018-09-18 DIAGNOSIS — Z86010 Personal history of colonic polyps: Secondary | ICD-10-CM

## 2018-09-18 DIAGNOSIS — Z79899 Other long term (current) drug therapy: Secondary | ICD-10-CM | POA: Diagnosis not present

## 2018-09-18 DIAGNOSIS — Z860101 Personal history of adenomatous and serrated colon polyps: Secondary | ICD-10-CM

## 2018-09-18 DIAGNOSIS — Z23 Encounter for immunization: Secondary | ICD-10-CM

## 2018-09-18 DIAGNOSIS — F17200 Nicotine dependence, unspecified, uncomplicated: Secondary | ICD-10-CM | POA: Diagnosis not present

## 2018-09-18 DIAGNOSIS — Z0000 Encounter for general adult medical examination without abnormal findings: Secondary | ICD-10-CM

## 2018-09-18 DIAGNOSIS — M5136 Other intervertebral disc degeneration, lumbar region: Secondary | ICD-10-CM

## 2018-09-18 DIAGNOSIS — R739 Hyperglycemia, unspecified: Secondary | ICD-10-CM

## 2018-09-18 DIAGNOSIS — Z13220 Encounter for screening for lipoid disorders: Secondary | ICD-10-CM | POA: Diagnosis not present

## 2018-09-18 DIAGNOSIS — Z1231 Encounter for screening mammogram for malignant neoplasm of breast: Secondary | ICD-10-CM

## 2018-09-18 DIAGNOSIS — M51369 Other intervertebral disc degeneration, lumbar region without mention of lumbar back pain or lower extremity pain: Secondary | ICD-10-CM

## 2018-09-18 LAB — BASIC METABOLIC PANEL WITH GFR
BUN: 11 mg/dL (ref 6–23)
CO2: 29 meq/L (ref 19–32)
Calcium: 10 mg/dL (ref 8.4–10.5)
Chloride: 106 meq/L (ref 96–112)
Creatinine, Ser: 0.75 mg/dL (ref 0.40–1.20)
GFR: 99.13 mL/min
Glucose, Bld: 105 mg/dL — ABNORMAL HIGH (ref 70–99)
Potassium: 4.6 meq/L (ref 3.5–5.1)
Sodium: 142 meq/L (ref 135–145)

## 2018-09-18 LAB — LIPID PANEL
CHOL/HDL RATIO: 2
CHOLESTEROL: 160 mg/dL (ref 0–200)
HDL: 71.5 mg/dL (ref >39.00)
LDL Cholesterol: 68 mg/dL (ref 0–99)
NONHDL: 88.24
Triglycerides: 102 mg/dL (ref 0.0–149.0)
VLDL: 20.4 mg/dL (ref 0.0–40.0)

## 2018-09-18 LAB — HEMOGLOBIN A1C: Hgb A1c MFr Bld: 6.5 % (ref 4.6–6.5)

## 2018-10-03 ENCOUNTER — Ambulatory Visit (AMBULATORY_SURGERY_CENTER): Payer: Self-pay | Admitting: *Deleted

## 2018-10-03 ENCOUNTER — Encounter: Payer: Self-pay | Admitting: Internal Medicine

## 2018-10-03 VITALS — Ht 65.25 in | Wt 181.0 lb

## 2018-10-03 DIAGNOSIS — Z86010 Personal history of colonic polyps: Secondary | ICD-10-CM

## 2018-10-03 NOTE — Progress Notes (Signed)
Patient denies any allergies to eggs or soy. Patient denies any problems with anesthesia/sedation. Patient denies any oxygen use at home. Patient denies taking any diet/weight loss medications or blood thinners. EMMI education offered, pt declined.  

## 2018-10-05 ENCOUNTER — Ambulatory Visit: Payer: PPO | Admitting: Podiatry

## 2018-10-05 ENCOUNTER — Encounter: Payer: Self-pay | Admitting: Podiatry

## 2018-10-05 DIAGNOSIS — B351 Tinea unguium: Secondary | ICD-10-CM

## 2018-10-05 DIAGNOSIS — M79676 Pain in unspecified toe(s): Secondary | ICD-10-CM

## 2018-10-15 ENCOUNTER — Encounter: Payer: Self-pay | Admitting: Internal Medicine

## 2018-10-15 ENCOUNTER — Ambulatory Visit (AMBULATORY_SURGERY_CENTER): Payer: PPO | Admitting: Internal Medicine

## 2018-10-15 ENCOUNTER — Encounter: Payer: PPO | Admitting: Internal Medicine

## 2018-10-15 VITALS — BP 107/87 | HR 67 | Temp 96.8°F | Resp 14 | Ht 65.25 in | Wt 182.0 lb

## 2018-10-15 DIAGNOSIS — Z86010 Personal history of colonic polyps: Secondary | ICD-10-CM

## 2018-10-15 DIAGNOSIS — E669 Obesity, unspecified: Secondary | ICD-10-CM | POA: Diagnosis not present

## 2018-10-15 MED ORDER — SODIUM CHLORIDE 0.9 % IV SOLN: 500.0000 mL | Freq: Once | INTRAVENOUS | Status: DC

## 2018-10-15 NOTE — Progress Notes (Signed)
Pt's states no medical or surgical changes since previsit or office visit. 

## 2018-10-15 NOTE — Progress Notes (Signed)
Report given to PACU, vss 

## 2018-10-15 NOTE — Progress Notes (Signed)
Into relief for lunch, pt sedated and restling with any signs or symptoms of distess or discomfort, vss

## 2018-10-15 NOTE — Patient Instructions (Addendum)
   No polyps today!  Your next routine colonoscopy should be in 5 years - 2024.  I appreciate the opportunity to care for you. Gatha Mayer, MD, FACG  YOU HAD AN ENDOSCOPIC PROCEDURE TODAY AT [facility]:   Refer to the procedure report that was given to you for any specific questions about what was found during the examination.  If the procedure report does not answer your questions, please call your gastroenterologist to clarify.  If you requested that your care partner not be given the details of your procedure findings, then the procedure report has been included in a sealed envelope for you to review at your convenience later.  YOU SHOULD EXPECT: Some feelings of bloating in the abdomen. Passage of more gas than usual.  Walking can help get rid of the air that was put into your GI tract during the procedure and reduce the bloating. If you had a lower endoscopy (such as a colonoscopy or flexible sigmoidoscopy) you may notice spotting of blood in your stool or on the toilet paper. If you underwent a bowel prep for your procedure, you may not have a normal bowel movement for a few days.  Please Note:  You might notice some irritation and congestion in your nose or some drainage.  This is from the oxygen used during your procedure.  There is no need for concern and it should clear up in a day or so.  SYMPTOMS TO REPORT IMMEDIATELY:   Following lower endoscopy (colonoscopy or flexible sigmoidoscopy):  Excessive amounts of blood in the stool  Significant tenderness or worsening of abdominal pains  Swelling of the abdomen that is new, acute  Fever of 100F or higher  For urgent or emergent issues, a gastroenterologist can be reached at any hour by calling [phone].   DIET:  We do recommend a small meal at first, but then you may proceed to your regular diet.  Drink plenty of fluids but you should avoid alcoholic beverages for 24 hours.  ACTIVITY:  You should  plan to take it easy for the rest of today and you should NOT DRIVE or use heavy machinery until tomorrow (because of the sedation medicines used during the test).    FOLLOW UP: Our staff will call the number listed on your records the next business day following your procedure to check on you and address any questions or concerns that you may have regarding the information given to you following your procedure. If we do not reach you, we will leave a message.  However, if you are feeling well and you are not experiencing any problems, there is no need to return our call.  We will assume that you have returned to your regular daily activities without incident.  If any biopsies were taken you will be contacted by phone or by letter within the next 1-3 weeks.  Please call us at [phone] if you have not heard about the biopsies in 3 weeks.    SIGNATURES/CONFIDENTIALITY: You and/or your care partner have signed paperwork which will be entered into your electronic medical record.  These signatures attest to the fact that that the information above on your After Visit Summary has been reviewed and is understood.  Full responsibility of the confidentiality of this discharge information lies with you and/or your care-partner.

## 2018-10-15 NOTE — Op Note (Signed)
[facility] Patient Name: Debra Woodward Procedure Date: 10/15/2018 11:13 AM MRN: [id number] Endoscopist: Gatha Mayer , MD Age: 67 Referring MD:  Date of Birth: 02/01/1951 Gender: Female Account #: [id number] Procedure:                Colonoscopy Indications:              Surveillance: Personal history of adenomatous                            polyps on last colonoscopy 3 years ago Medicines:                Propofol per Anesthesia, Monitored Anesthesia Care Procedure:                Pre-Anesthesia Assessment:                           - Prior to the procedure, a History and Physical                            was performed, and patient medications and                            allergies were reviewed. The patient's tolerance of                            previous anesthesia was also reviewed. The risks                            and benefits of the procedure and the sedation                            options and risks were discussed with the patient.                            All questions were answered, and informed consent                            was obtained. Prior Anticoagulants: The patient has                            taken no previous anticoagulant or antiplatelet                            agents. ASA Grade Assessment: II - A patient with                            mild systemic disease. After reviewing the risks                            and benefits, the patient was deemed in                            satisfactory condition to undergo the procedure.  After obtaining informed consent, the colonoscope                            was passed under direct vision. Throughout the                            procedure, the patient's blood pressure, pulse, and                            oxygen saturations were monitored continuously. The                            Colonoscope was introduced through the anus and   advanced to the the cecum, identified by                            appendiceal orifice and ileocecal valve. The                            colonoscopy was somewhat difficult due to                            significant looping. Successful completion of the                            procedure was aided by using manual pressure and                            biopsy forceps traction on mucosa of cecum. The                            patient tolerated the procedure well. The quality                            of the bowel preparation was good. The ileocecal                            valve, appendiceal orifice, and rectum were                            photographed. The bowel preparation used was                            Miralax. Scope In: 11:30:13 AM Scope Out: 11:48:32 AM Scope Withdrawal Time: 0 hours 14 minutes 2 seconds  Total Procedure Duration: 0 hours 18 minutes 19 seconds  Findings:                 The perianal and digital rectal examinations were                            normal.                           The entire examined colon appeared normal on direct  and retroflexion views. Complications:            No immediate complications. Estimated Blood Loss:     Estimated blood loss: none. Impression:               - The entire examined colon is normal on direct and                            retroflexion views. Had to use biopsy forceps to                            pull cecum to scope to see cecum.                           - No specimens collected.                           - Personal history of colonic polyps. 4 adenomas                            2016 Recommendation:           - Patient has a contact number available for                            emergencies. The signs and symptoms of potential                            delayed complications were discussed with the                            patient. Return to normal activities tomorrow.                             Written discharge instructions were provided to the                            patient.                           - Resume previous diet.                           - Continue present medications.                           - Repeat colonoscopy in 5 years for surveillance. Gatha Mayer, MD 10/15/2018 11:54:30 AM This report has been signed electronically.

## 2018-10-16 ENCOUNTER — Telehealth: Payer: Self-pay | Admitting: *Deleted

## 2018-10-16 NOTE — Telephone Encounter (Signed)
  Follow up Call-  Call back number 10/15/2018  Post procedure Call Back phone  # [phone]  Permission to leave phone message Yes  Some recent data might be hidden     Patient questions:  Do you have a fever, pain , or abdominal swelling? No. Pain Score  0 *  Have you tolerated food without any problems? Yes.    Have you been able to return to your normal activities? Yes.    Do you have any questions about your discharge instructions: Diet   No. Medications  No. Follow up visit  No.  Do you have questions or concerns about your Care? No.  Actions: * If pain score is 4 or above: No action needed, pain <4.

## 2018-10-26 ENCOUNTER — Encounter: Payer: Self-pay | Admitting: Podiatry

## 2018-10-26 DIAGNOSIS — H1013 Acute atopic conjunctivitis, bilateral: Secondary | ICD-10-CM | POA: Diagnosis not present

## 2018-10-26 DIAGNOSIS — H40023 Open angle with borderline findings, high risk, bilateral: Secondary | ICD-10-CM | POA: Diagnosis not present

## 2018-10-26 DIAGNOSIS — H40033 Anatomical narrow angle, bilateral: Secondary | ICD-10-CM | POA: Diagnosis not present

## 2018-10-26 DIAGNOSIS — H00022 Hordeolum internum right lower eyelid: Secondary | ICD-10-CM | POA: Diagnosis not present

## 2018-10-26 NOTE — Progress Notes (Signed)
Subjective:  Patient presents to clinic with cc of  painful, thick, discolored, elongated toenails 1-5 b/l that become tender and cannot cut because of thickness.  Objective:  Physical Examination: Neurovascular status intact with thick, discolored brittle toenails 1-5 b/l  Assessment: Mycotic nail infection with pain 1-5 b/l  Plan:  Debride painful toenails 1-5 b/l with no iatrogenic bleeding. Follow up when pain symptoms return.

## 2018-10-31 ENCOUNTER — Ambulatory Visit
Admission: RE | Admit: 2018-10-31 | Discharge: 2018-10-31 | Disposition: A | Discharge status: Home / Self Care | Payer: PPO | Source: Ambulatory Visit | Attending: Internal Medicine | Admitting: Internal Medicine

## 2018-10-31 DIAGNOSIS — Z1231 Encounter for screening mammogram for malignant neoplasm of breast: Secondary | ICD-10-CM | POA: Diagnosis not present

## 2018-12-03 NOTE — Progress Notes (Signed)
Chief Complaint  Patient presents with  . Vaginal Itching    x 2-3 days. Some itching and mentions having a slight "amonia" odor yesterday. No discharge at this time. No change in her medications, no new body washes. Pt has been using Vaginal Moisturizer since October. No urinary issues  . Breast Discharge    Left breast discharge x 3 weeks.     HPI: Debra Woodward 68 y.o. come in for sda   For 2 issues  Vagina odor not sure if  Could have infection better today  Using [employer] new relationship    Had itch left breast and then went away but  Noted on expression there was a white substance  Has hs of cysts and biopsies in past benign.  mammo December was normal   For her.  ROS: See pertinent positives and negatives per HPI. No pain rash new sx  No fever uti sx   Past Medical History:  Diagnosis Date  . ALLERGIC RHINITIS 09/14/2006  . Allergy   . Fibroid   . Hx of adenomatous colonic polyps 07/28/2015  . Seasonal allergies     Family History  Problem Relation Age of Onset  . Stroke Mother   . Diabetes Mother   . Diabetes Father   . Hypertension Sister   . Esophageal cancer Paternal Grandfather 34  . Colon cancer Neg Hx   . Stomach cancer Neg Hx   . Rectal cancer Neg Hx   . Breast cancer Neg Hx   . Colon polyps Neg Hx     Social History   Socioeconomic History  . Marital status: Single    Spouse name: Not on file  . Number of children: Not on file  . Years of education: Not on file  . Highest education level: Not on file  Occupational History  . Not on file  Social Needs  . Financial resource strain: Not on file  . Food insecurity:    Worry: Not on file    Inability: Not on file  . Transportation needs:    Medical: Not on file    Non-medical: Not on file  Tobacco Use  . Smoking status: Current Every Day Smoker    Packs/day: 0.25    Types: Cigarettes  . Smokeless tobacco: Never Used  . Tobacco comment: 4 cigs a day (09/28/17)  Substance and Sexual Activity    . Alcohol use: Yes    Alcohol/week: 2.0 standard drinks    Types: 2 Glasses of wine per week    Comment: social  . Drug use: No  . Sexual activity: Yes    Birth control/protection: Surgical    Comment: HYST  Lifestyle  . Physical activity:    Days per week: Not on file    Minutes per session: Not on file  . Stress: Not on file  Relationships  . Social connections:    Talks on phone: Not on file    Gets together: Not on file    Attends religious service: Not on file    Active member of club or organization: Not on file    Attends meetings of clubs or organizations: Not on file    Relationship status: Not on file  Other Topics Concern  . 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  Outpatient Medications Prior to Visit  Medication Sig Dispense Refill  . Biotin 5 MG CAPS Take 1 capsule by mouth daily.    . carboxymethylcellulose 1 % ophthalmic solution Apply 1 drop to eye daily.    . Cholecalciferol (VITAMIN D3) 2000 UNITS capsule Take 2,000 Units by mouth daily.    . fluticasone (FLONASE) 50 MCG/ACT nasal spray   5  . loteprednol (LOTEMAX) 0.2 % SUSP Place 1 drop into both eyes daily.    . Multiple Vitamin (MULTIVITAMIN) tablet Take 1 tablet by mouth daily.      . Olopatadine HCl (PATADAY) 0.2 % SOLN Apply 1 drop to eye daily.     No facility-administered medications prior to visit.      EXAM:  BP 138/82 (BP Location: Left Arm, Patient Position: Sitting, Cuff Size: Normal)   Pulse 80   Temp 98.1 F (36.7 C) (Oral)   Wt 182 lb 11.2 oz (82.9 kg)   BMI 30.17 kg/m   Body mass index is 30.17 kg/m.  GENERAL: vitals reviewed and listed above, alert, oriented, appears well hydrated and in no acute distress HEENT: atraumatic, conjunctiva  clear, no obvious abnormalities on inspection of external nose and ears  NECK: no obvious masses on inspection palpation  Breast no masses  Left  nipple  No rash  After  ltos of manipulation there is a with thick substance from duct  No blood or  Mass or clear dc  CV: HRRR, no clubbing cyanosis or  peripheral edema nl cap refill  MS: moves all extremities without noticeable focal  Abnormality  Ext gu  nl  Vaginal  midl erythema thich cottage cheese  Clumpy like dc  Noted no lesion PSYCH: pleasant and cooperative, no obvious depression or anxiety Lab Results  Component Value Date   WBC 5.1 10/09/2017   HGB 13.7 10/09/2017   HCT 41.0 10/09/2017   PLT 359.0 10/09/2017   GLUCOSE 105 (H) 09/18/2018   CHOL 160 09/18/2018   TRIG 102.0 09/18/2018   HDL 71.50 09/18/2018   LDLCALC 68 09/18/2018   ALT 13 03/08/2017   AST 14 03/08/2017   NA 142 09/18/2018   K 4.6 09/18/2018   CL 106 09/18/2018   CREATININE 0.75 09/18/2018   BUN 11 09/18/2018   CO2 29 09/18/2018   TSH 1.74 03/08/2017   INR 0.94 09/07/2010   HGBA1C 6.5 09/18/2018   BP Readings from Last 3 Encounters:  12/04/18 138/82  10/15/18 107/87  09/18/18 138/82   aptima sent  ASSESSMENT AND PLAN:  Discussed the following assessment and plan:  Vaginal odor - Plan: Cervicovaginal ancillary only  Vaginal discharge - Plan: Cervicovaginal ancillary only  Acute vaginitis - Plan: Cervicovaginal ancillary only  Sign and symptom in breast  Breast discharge - see text left Poss yeast vs atrophic  Screen sti low risk   Empiric rx for yeast and wait results  Reassuring otherwise  breast sx  May not be pathologic  close observation and fu if  persistent or progressive  Pt aware  -Patient advised to return or notify health care team  if  new concerns arise.  Patient Instructions  Seems like yeast   Use otc monistat .  eachnight for 3 nights  If breast sx ongoing with itching etc let us know for further evaluation   Vaginitis Vaginitis is a condition in which the vaginal tissue swells and becomes red (inflamed). This condition is most often caused by a change in the  normal balance of  bacteria and yeast that live in the vagina. This change causes an overgrowth of certain bacteria or yeast, which causes the inflammation. There are different types of vaginitis, but the most common types are:  Bacterial vaginosis.  Yeast infection (candidiasis).  Trichomoniasis vaginitis. This is a sexually transmitted disease (STD).  Viral vaginitis.  Atrophic vaginitis.  Allergic vaginitis. What are the causes? The cause of this condition depends on the type of vaginitis. It can be caused by:  Bacteria (bacterial vaginosis).  Yeast, which is a fungus (yeast infection).  A parasite (trichomoniasis vaginitis).  A virus (viral vaginitis).  Low hormone levels (atrophic vaginitis). Low hormone levels can occur during pregnancy, breastfeeding, or after menopause.  Irritants, such as bubble baths, scented tampons, and feminine sprays (allergic vaginitis). Other factors can change the normal balance of the yeast and bacteria that live in the vagina. These include:  Antibiotic medicines.  Poor hygiene.  Diaphragms, vaginal sponges, spermicides, birth control pills, and intrauterine devices (IUD).  Sex.  Infection.  Uncontrolled diabetes.  A weakened defense (immune) system. What increases the risk? This condition is more likely to develop in women who:  Smoke.  Use vaginal douches, scented tampons, or scented sanitary pads.  Wear tight-fitting pants.  Wear thong underwear.  Use oral birth control pills or an IUD.  Have sex without a condom.  Have multiple sex partners.  Have an STD.  Frequently use the spermicide nonoxynol-9.  Eat lots of foods high in sugar.  Have uncontrolled diabetes.  Have low estrogen levels.  Have a weakened immune system from an immune disorder or medical treatment.  Are pregnant or breastfeeding. What are the signs or symptoms? Symptoms vary depending on the cause of the vaginitis. Common symptoms  include:  Abnormal vaginal discharge. ? The discharge is white, gray, or yellow with bacterial vaginosis. ? The discharge is thick, white, and cheesy with a yeast infection. ? The discharge is frothy and yellow or greenish with trichomoniasis.  A bad vaginal smell. The smell is fishy with bacterial vaginosis.  Vaginal itching, pain, or swelling.  Sex that is painful.  Pain or burning when urinating. Sometimes there are no symptoms. How is this diagnosed? This condition is diagnosed based on your symptoms and medical history. A physical exam, including a pelvic exam, will also be done. You may also have other tests, including:  Tests to determine the pH level (acidity or alkalinity) of your vagina.  A whiff test, to assess the odor that results when a sample of your vaginal discharge is mixed with a potassium hydroxide solution.  Tests of vaginal fluid. A sample will be examined under a microscope. How is this treated? Treatment varies depending on the type of vaginitis you have. Your treatment may include:  Antibiotic creams or pills to treat bacterial vaginosis and trichomoniasis.  Antifungal medicines, such as vaginal creams or suppositories, to treat a yeast infection.  Medicine to ease discomfort if you have viral vaginitis. Your sexual partner should also be treated.  Estrogen delivered in a cream, pill, suppository, or vaginal ring to treat atrophic vaginitis. If vaginal dryness occurs, lubricants and moisturizing creams may help. You may need to avoid scented soaps, sprays, or douches.  Stopping use of a product that is causing allergic vaginitis. Then using a vaginal cream to treat the symptoms. Follow these instructions at home: Lifestyle  Keep your genital area clean and dry. Avoid soap, and only rinse the area with water.  Do not douche or use tampons  until your health care provider says it is okay to do so. Use sanitary pads, if needed.  Do not have sex until  your health care provider approves. When you can return to sex, practice safe sex and use condoms.  Wipe from front to back. This avoids the spread of bacteria from the rectum to the vagina. General instructions  Take over-the-counter and prescription medicines only as told by your health care provider.  If you were prescribed an antibiotic medicine, take or use it as told by your health care provider. Do not stop taking or using the antibiotic even if you start to feel better.  Keep all follow-up visits as told by your health care provider. This is important. How is this prevented?  Use mild, non-scented products. Do not use things that can irritate the vagina, such as fabric softeners. Avoid the following products if they are scented: ? Feminine sprays. ? Detergents. ? Tampons. ? Feminine hygiene products. ? Soaps or bubble baths.  Let air reach your genital area. ? Wear cotton underwear to reduce moisture buildup. ? Avoid wearing underwear while you sleep. ? Avoid wearing tight pants and underwear or nylons without a cotton panel. ? Avoid wearing thong underwear.  Take off any wet clothing, such as bathing suits, as soon as possible.  Practice safe sex and use condoms. Contact a health care provider if:  You have abdominal pain.  You have a fever.  You have symptoms that last for more than 2-3 days. Get help right away if:  You have a fever and your symptoms suddenly get worse. Summary  Vaginitis is a condition in which the vaginal tissue becomes inflamed.This condition is most often caused by a change in the normal balance of bacteria and yeast that live in the vagina.  Treatment varies depending on the type of vaginitis you have.  Do not douche, use tampons , or have sex until your health care provider approves. When you can return to sex, practice safe sex and use condoms. This information is not intended to replace advice given to you by your health care provider.  Make sure you discuss any questions you have with your health care provider. Document Released: 08/28/2007 Document Revised: 12/06/2016 Document Reviewed: 12/06/2016 Elsevier Interactive Patient Education  2019 [employer]. Liona Wengert M.D.

## 2018-12-04 ENCOUNTER — Other Ambulatory Visit (HOSPITAL_COMMUNITY)
Admission: RE | Admit: 2018-12-04 | Discharge: 2018-12-04 | Disposition: A | Discharge status: Home / Self Care | Payer: PPO | Source: Ambulatory Visit | Attending: Internal Medicine | Admitting: Internal Medicine

## 2018-12-04 ENCOUNTER — Encounter: Payer: Self-pay | Admitting: Internal Medicine

## 2018-12-04 ENCOUNTER — Ambulatory Visit (INDEPENDENT_AMBULATORY_CARE_PROVIDER_SITE_OTHER): Payer: PPO | Admitting: Internal Medicine

## 2018-12-04 VITALS — BP 138/82 | HR 80 | Temp 98.1°F | Wt 182.7 lb

## 2018-12-04 DIAGNOSIS — N898 Other specified noninflammatory disorders of vagina: Secondary | ICD-10-CM | POA: Insufficient documentation

## 2018-12-04 DIAGNOSIS — N760 Acute vaginitis: Secondary | ICD-10-CM | POA: Insufficient documentation

## 2018-12-04 DIAGNOSIS — N6459 Other signs and symptoms in breast: Secondary | ICD-10-CM

## 2018-12-04 DIAGNOSIS — N6452 Nipple discharge: Secondary | ICD-10-CM

## 2018-12-04 NOTE — Patient Instructions (Addendum)
Seems like yeast   Use otc monistat .  eachnight for 3 nights  If breast sx ongoing with itching etc let us know for further evaluation   Vaginitis Vaginitis is a condition in which the vaginal tissue swells and becomes red (inflamed). This condition is most often caused by a change in the normal balance of bacteria and yeast that live in the vagina. This change causes an overgrowth of certain bacteria or yeast, which causes the inflammation. There are different types of vaginitis, but the most common types are:  Bacterial vaginosis.  Yeast infection (candidiasis).  Trichomoniasis vaginitis. This is a sexually transmitted disease (STD).  Viral vaginitis.  Atrophic vaginitis.  Allergic vaginitis. What are the causes? The cause of this condition depends on the type of vaginitis. It can be caused by:  Bacteria (bacterial vaginosis).  Yeast, which is a fungus (yeast infection).  A parasite (trichomoniasis vaginitis).  A virus (viral vaginitis).  Low hormone levels (atrophic vaginitis). Low hormone levels can occur during pregnancy, breastfeeding, or after menopause.  Irritants, such as bubble baths, scented tampons, and feminine sprays (allergic vaginitis). Other factors can change the normal balance of the yeast and bacteria that live in the vagina. These include:  Antibiotic medicines.  Poor hygiene.  Diaphragms, vaginal sponges, spermicides, birth control pills, and intrauterine devices (IUD).  Sex.  Infection.  Uncontrolled diabetes.  A weakened defense (immune) system. What increases the risk? This condition is more likely to develop in women who:  Smoke.  Use vaginal douches, scented tampons, or scented sanitary pads.  Wear tight-fitting pants.  Wear thong underwear.  Use oral birth control pills or an IUD.  Have sex without a condom.  Have multiple sex partners.  Have an STD.  Frequently use the spermicide nonoxynol-9.  Eat lots of foods high in  sugar.  Have uncontrolled diabetes.  Have low estrogen levels.  Have a weakened immune system from an immune disorder or medical treatment.  Are pregnant or breastfeeding. What are the signs or symptoms? Symptoms vary depending on the cause of the vaginitis. Common symptoms include:  Abnormal vaginal discharge. ? The discharge is white, gray, or yellow with bacterial vaginosis. ? The discharge is thick, white, and cheesy with a yeast infection. ? The discharge is frothy and yellow or greenish with trichomoniasis.  A bad vaginal smell. The smell is fishy with bacterial vaginosis.  Vaginal itching, pain, or swelling.  Sex that is painful.  Pain or burning when urinating. Sometimes there are no symptoms. How is this diagnosed? This condition is diagnosed based on your symptoms and medical history. A physical exam, including a pelvic exam, will also be done. You may also have other tests, including:  Tests to determine the pH level (acidity or alkalinity) of your vagina.  A whiff test, to assess the odor that results when a sample of your vaginal discharge is mixed with a potassium hydroxide solution.  Tests of vaginal fluid. A sample will be examined under a microscope. How is this treated? Treatment varies depending on the type of vaginitis you have. Your treatment may include:  Antibiotic creams or pills to treat bacterial vaginosis and trichomoniasis.  Antifungal medicines, such as vaginal creams or suppositories, to treat a yeast infection.  Medicine to ease discomfort if you have viral vaginitis. Your sexual partner should also be treated.  Estrogen delivered in a cream, pill, suppository, or vaginal ring to treat atrophic vaginitis. If vaginal dryness occurs, lubricants and moisturizing creams may help. You  may need to avoid scented soaps, sprays, or douches.  Stopping use of a product that is causing allergic vaginitis. Then using a vaginal cream to treat the  symptoms. Follow these instructions at home: Lifestyle  Keep your genital area clean and dry. Avoid soap, and only rinse the area with water.  Do not douche or use tampons until your health care provider says it is okay to do so. Use sanitary pads, if needed.  Do not have sex until your health care provider approves. When you can return to sex, practice safe sex and use condoms.  Wipe from front to back. This avoids the spread of bacteria from the rectum to the vagina. General instructions  Take over-the-counter and prescription medicines only as told by your health care provider.  If you were prescribed an antibiotic medicine, take or use it as told by your health care provider. Do not stop taking or using the antibiotic even if you start to feel better.  Keep all follow-up visits as told by your health care provider. This is important. How is this prevented?  Use mild, non-scented products. Do not use things that can irritate the vagina, such as fabric softeners. Avoid the following products if they are scented: ? Feminine sprays. ? Detergents. ? Tampons. ? Feminine hygiene products. ? Soaps or bubble baths.  Let air reach your genital area. ? Wear cotton underwear to reduce moisture buildup. ? Avoid wearing underwear while you sleep. ? Avoid wearing tight pants and underwear or nylons without a cotton panel. ? Avoid wearing thong underwear.  Take off any wet clothing, such as bathing suits, as soon as possible.  Practice safe sex and use condoms. Contact a health care provider if:  You have abdominal pain.  You have a fever.  You have symptoms that last for more than 2-3 days. Get help right away if:  You have a fever and your symptoms suddenly get worse. Summary  Vaginitis is a condition in which the vaginal tissue becomes inflamed.This condition is most often caused by a change in the normal balance of bacteria and yeast that live in the vagina.  Treatment  varies depending on the type of vaginitis you have.  Do not douche, use tampons , or have sex until your health care provider approves. When you can return to sex, practice safe sex and use condoms. This information is not intended to replace advice given to you by your health care provider. Make sure you discuss any questions you have with your health care provider. Document Released: 08/28/2007 Document Revised: 12/06/2016 Document Reviewed: 12/06/2016 Elsevier Interactive Patient Education  2019 [employer].

## 2018-12-05 LAB — CERVICOVAGINAL ANCILLARY ONLY
Bacterial vaginitis: NEGATIVE
Candida vaginitis: NEGATIVE
Chlamydia: NEGATIVE
Neisseria Gonorrhea: NEGATIVE
Trichomonas: NEGATIVE

## 2018-12-26 DIAGNOSIS — H8113 Benign paroxysmal vertigo, bilateral: Secondary | ICD-10-CM | POA: Diagnosis not present

## 2018-12-26 DIAGNOSIS — H6123 Impacted cerumen, bilateral: Secondary | ICD-10-CM | POA: Diagnosis not present

## 2019-01-04 ENCOUNTER — Ambulatory Visit: Payer: PPO | Admitting: Podiatry

## 2019-01-16 DIAGNOSIS — M25552 Pain in left hip: Secondary | ICD-10-CM | POA: Diagnosis not present

## 2019-01-16 DIAGNOSIS — M25561 Pain in right knee: Secondary | ICD-10-CM | POA: Diagnosis not present

## 2019-01-16 DIAGNOSIS — M25551 Pain in right hip: Secondary | ICD-10-CM | POA: Diagnosis not present

## 2019-02-12 ENCOUNTER — Other Ambulatory Visit: Payer: Self-pay

## 2019-02-12 ENCOUNTER — Ambulatory Visit: Payer: PPO | Admitting: Podiatry

## 2019-02-12 ENCOUNTER — Encounter: Payer: Self-pay | Admitting: Podiatry

## 2019-02-12 VITALS — Temp 98.3°F

## 2019-02-12 DIAGNOSIS — M79676 Pain in unspecified toe(s): Secondary | ICD-10-CM

## 2019-02-12 DIAGNOSIS — B351 Tinea unguium: Secondary | ICD-10-CM

## 2019-02-12 MED ORDER — NONFORMULARY OR COMPOUNDED ITEM: 1.0000 g | Freq: Every day | 3 refills | Status: DC

## 2019-02-12 NOTE — Progress Notes (Signed)
Subjective: Debra Woodward presents today with painful, thick toenails 1-5 b/l that she cannot cut and which interfere with daily activities.  Pain is aggravated when wearing enclosed shoe gear and relieved with periodic professional debridement.  Panosh, [name], MD is her PCP and last visit was 12/04/2018.  Debra Woodward voices no new pedal concerns on today's visit.  Allergies  Allergen Reactions  . Bee Venom Hives  . Prednisone Other (See Comments)    hallucinations    Objective:  Vascular Examination: Capillary refill time immediate x 10 digits  Dorsalis pedis and Posterior tibial pulses palpable b/l  Digital hair present x 10 digits  Skin temperature gradient WNL b/l  Dermatological Examination: Skin with normal turgor, texture and tone b/l  Toenails 1-5 b/l discolored, thick, dystrophic with subungual debris and pain with palpation to nailbeds due to thickness of nails.  Musculoskeletal: Muscle strength 5/5 to all LE muscle groups  No gross bony deformities b/l.  No pain, crepitus or joint limitation noted with ROM.   Neurological: Sensation intact with 10 gram monofilament.  Vibratory sensation intact.  Assessment: Painful onychomycosis toenails 1-5 b/l   Plan: 1. Toenails 1-5 b/l were debrided in length and girth without iatrogenic bleeding. Discussed treatment options. She would like to try topical nail antifungal from [facility]. Rx written for nonformulary compounding topical antifungal: [facility]: Antifungal cream - Terbinafine 3%, Fluconazole 2%, Tea Tree Oil 5%, Urea 10%, Ibuprofen 2% in DMSO Suspension #75ml. Apply to the affected nail(s) at bedtime. 2. Patient to continue soft, supportive shoe gear daily. 3. Patient to report any pedal injuries to medical professional immediately. 4. Follow up 3 months.  5. Patient/POA to call should there be a concern in the interim.

## 2019-02-14 IMAGING — MG DIGITAL SCREENING BILATERAL MAMMOGRAM WITH TOMO AND CAD
8 series · 8 of 24 positions shown · non-contrast
Comparison: Previous exam(s).

CLINICAL DATA: Screening.

EXAM:
DIGITAL SCREENING BILATERAL MAMMOGRAM WITH TOMO AND CAD

[L MLO synth-2D]
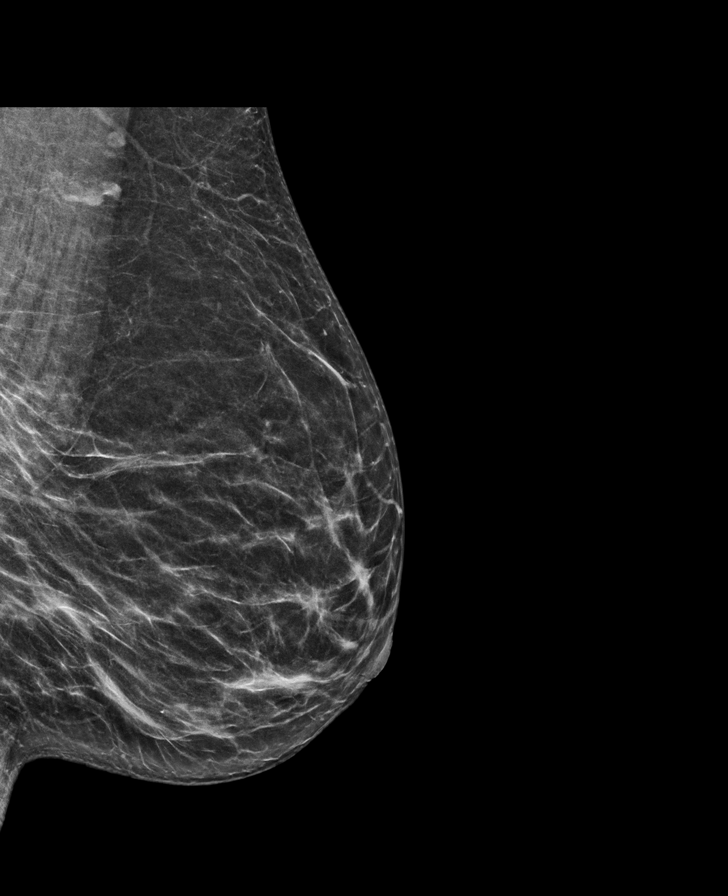

[L CC synth-2D]
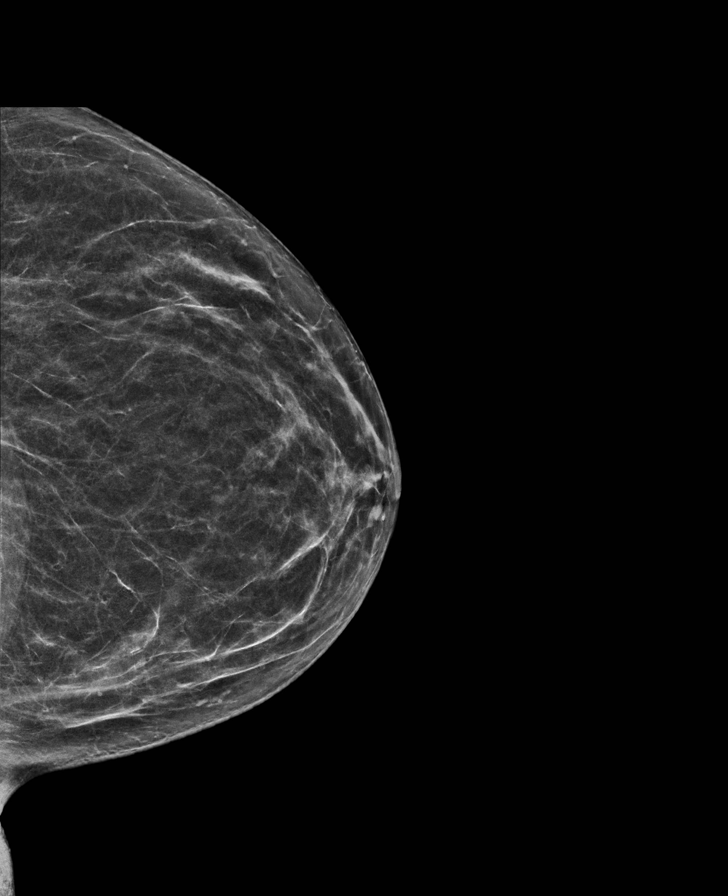

[R MLO synth-2D]
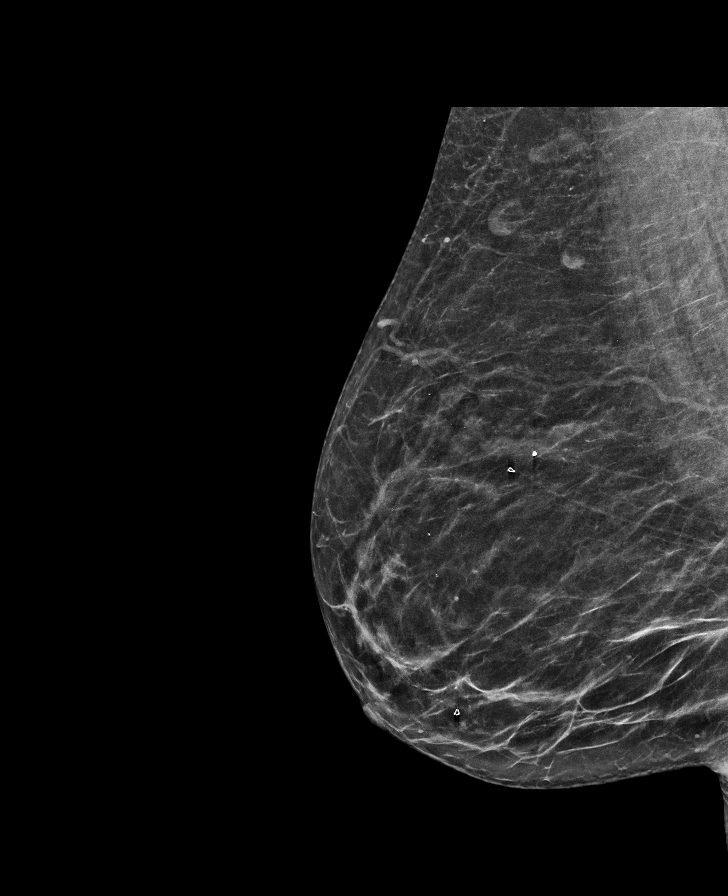

[R CC synth-2D]
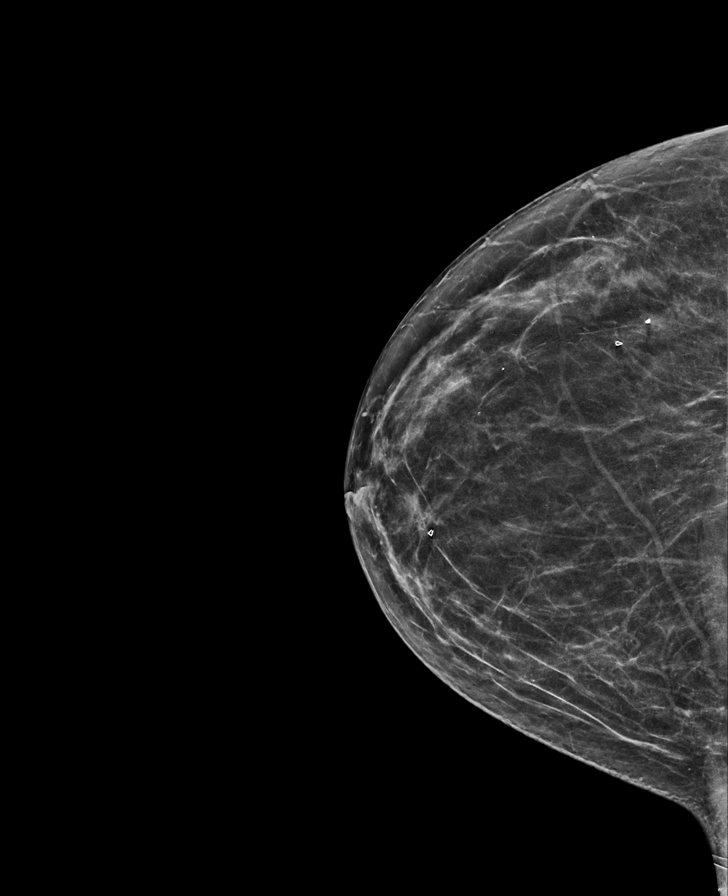

[L CC tomo · tomo slice 31/61.0]
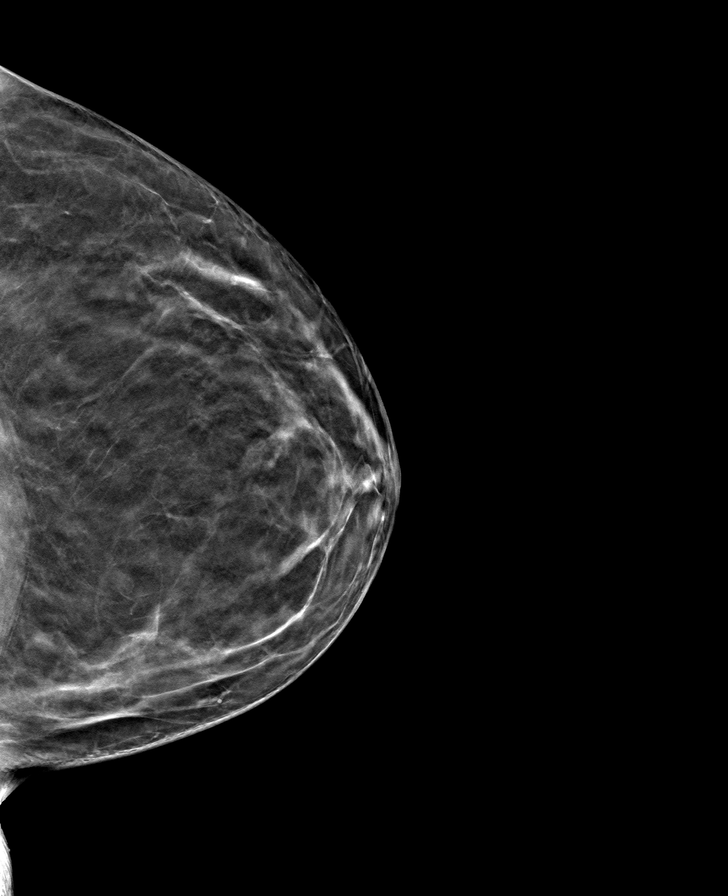

[R CC tomo · tomo slice 31/60.0]
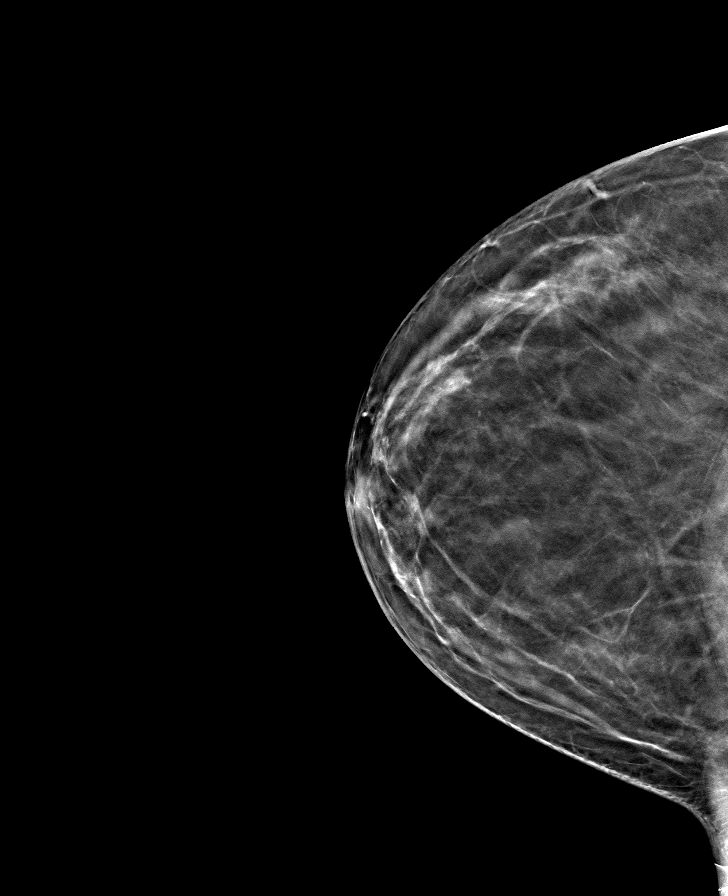

[L MLO tomo · tomo slice 32/63.0]
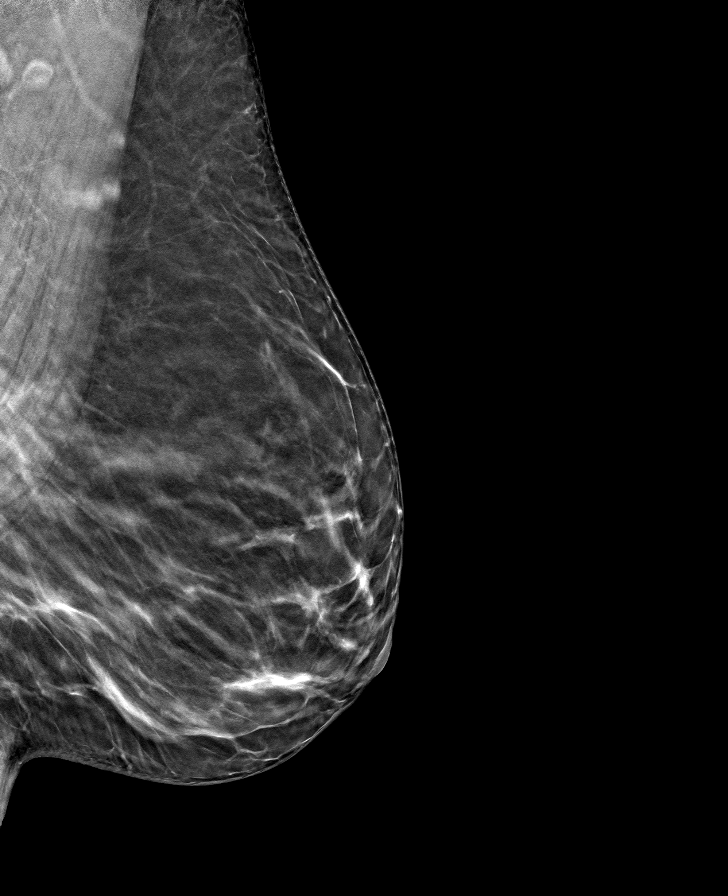

[R MLO tomo · tomo slice 33/64.0]
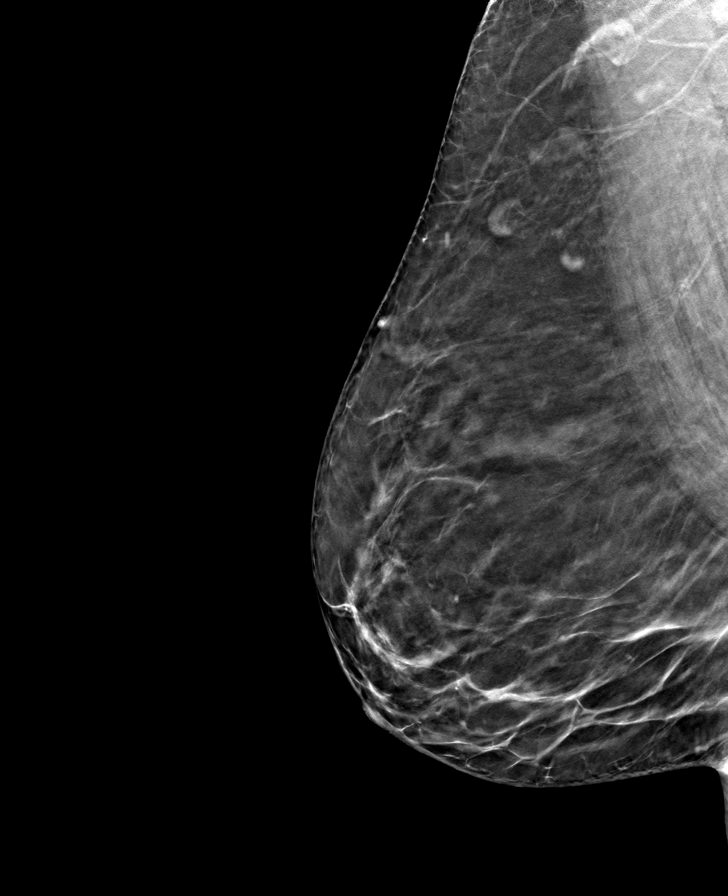

[8 of 24 positions shown; findings below may reference images not displayed]

ACR Breast Density Category b: There are scattered areas of
fibroglandular density.
FINDINGS: There are no findings suspicious for malignancy. Images were
processed with CAD.
IMPRESSION: No mammographic evidence of malignancy. A result letter of this
screening mammogram will be mailed directly to the patient.

RECOMMENDATION:
Screening mammogram in one year. (Code:[id number])

BI-RADS CATEGORY  1: Negative.

## 2019-03-14 ENCOUNTER — Other Ambulatory Visit: Payer: Self-pay

## 2019-03-18 ENCOUNTER — Encounter: Payer: Self-pay | Admitting: Women's Health

## 2019-03-18 ENCOUNTER — Other Ambulatory Visit: Payer: Self-pay

## 2019-03-18 ENCOUNTER — Ambulatory Visit: Payer: PPO | Admitting: Women's Health

## 2019-03-18 VITALS — BP 130/80 | Ht 65.0 in | Wt 179.0 lb

## 2019-03-18 DIAGNOSIS — N898 Other specified noninflammatory disorders of vagina: Secondary | ICD-10-CM

## 2019-03-18 DIAGNOSIS — Z113 Encounter for screening for infections with a predominantly sexual mode of transmission: Secondary | ICD-10-CM | POA: Diagnosis not present

## 2019-03-18 DIAGNOSIS — Z01419 Encounter for gynecological examination (general) (routine) without abnormal findings: Secondary | ICD-10-CM

## 2019-03-18 LAB — WET PREP FOR TRICH, YEAST, CLUE

## 2019-03-18 NOTE — Progress Notes (Signed)
Debra Woodward 1951-07-30 [id number]    History:    Presents for breast and pelvic exam.  1996 TAH for fibroids.  Normal Pap and mammogram history.  2016 benign colon polyp 3-year follow-up in 2019-/no colon polyps.  Vaccines current.  Labs primary care.  2016 T score -0.5 at the hip stable.  New partner.  States had scant discharge a few weeks ago and used over-the-counter Monistat unsure if it is resolved.  No known health problems, smoker less than half pack daily.  Past medical history, past surgical history, family history and social history were all reviewed and documented in the EPIC chart.  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  Parents diabetes.  2 children, son in [place], daughter in [place]. and 2 grandchildren.  ROS:  A ROS was performed and pertinent positives and negatives are included.  Exam:  Vitals:   03/18/19 0830  BP: 130/80  Weight: 179 lb (81.2 kg)  Height: 5\' 5"  (1.651 m)   Body mass index is 29.79 kg/m.   General appearance:  Normal Thyroid:  Symmetrical, normal in size, without palpable masses or nodularity. Respiratory  Auscultation:  Clear without wheezing or rhonchi Cardiovascular  Auscultation:  Regular rate, without rubs, murmurs or gallops  Edema/varicosities:  Not grossly evident Abdominal  Soft,nontender, without masses, guarding or rebound.  Liver/spleen:  No organomegaly noted  Hernia:  None appreciated  Skin  Inspection:  Grossly normal   Breasts: Examined lying and sitting.     Right: Without masses, retractions, discharge or axillary adenopathy.     Left: Without masses, retractions, discharge or axillary adenopathy. Gentitourinary   Inguinal/mons:  Normal without inguinal adenopathy  External genitalia:  Normal  BUS/Urethra/Skene's glands:  Normal  Vagina:  Normal  Cervix: And uterus absent  Adnexa/parametria:     Rt: Without masses or tenderness.   Lt: Without masses or tenderness.  Anus and perineum: Normal  Digital rectal  exam: Normal sphincter tone without palpated masses or tenderness  Assessment/Plan:  68 y.o. D WF G3, P2 for breast and pelvic exam with complaint of questionable discharge with no odor or itching.  1996 TAH for fibroids 2016 normal DEXA  labs-primary care STD screen Smoker less than half pack daily  Plan: Wet prep and exam normal, reassurance given.  SBEs, continue annual screening mammogram, calcium rich foods, vitamin D 2000 daily encouraged.  Reviewed importance of weightbearing and balance type exercise encouraged walking and yoga.  Home safety, fall prevention discussed.  Reviewed importance of no smoking, tips to quit reviewed.  GC/chlamydia, HIV, RPR.   Leon, 8:40 AM 03/18/2019

## 2019-03-18 NOTE — Patient Instructions (Addendum)
Health Maintenance After Age 68 After age 68, you are at a higher risk for certain long-term diseases and infections as well as injuries from falls. Falls are a major cause of broken bones and head injuries in people who are older than age 68. Getting regular preventive care can help to keep you healthy and well. Preventive care includes getting regular testing and making lifestyle changes as recommended by your health care provider. Talk with your health care provider about:  Which screenings and tests you should have. A screening is a test that checks for a disease when you have no symptoms.  A diet and exercise plan that is right for you. What should I know about screenings and tests to prevent falls? Screening and testing are the best ways to find a health problem early. Early diagnosis and treatment give you the best chance of managing medical conditions that are common after age 68. Certain conditions and lifestyle choices may make you more likely to have a fall. Your health care provider may recommend:  Regular vision checks. Poor vision and conditions such as cataracts can make you more likely to have a fall. If you wear glasses, make sure to get your prescription updated if your vision changes.  Medicine review. Work with your health care provider to regularly review all of the medicines you are taking, including over-the-counter medicines. Ask your health care provider about any side effects that may make you more likely to have a fall. Tell your health care provider if any medicines that you take make you feel dizzy or sleepy.  Osteoporosis screening. Osteoporosis is a condition that causes the bones to get weaker. This can make the bones weak and cause them to break more easily.  Blood pressure screening. Blood pressure changes and medicines to control blood pressure can make you feel dizzy.  Strength and balance checks. Your health care provider may recommend certain tests to check your  strength and balance while standing, walking, or changing positions.  Foot health exam. Foot pain and numbness, as well as not wearing proper footwear, can make you more likely to have a fall.  Depression screening. You may be more likely to have a fall if you have a fear of falling, feel emotionally low, or feel unable to do activities that you used to do.  Alcohol use screening. Using too much alcohol can affect your balance and may make you more likely to have a fall. What actions can I take to lower my risk of falls? General instructions  Talk with your health care provider about your risks for falling. Tell your health care provider if: ? You fall. Be sure to tell your health care provider about all falls, even ones that seem minor. ? You feel dizzy, sleepy, or off-balance.  Take over-the-counter and prescription medicines only as told by your health care provider. These include any supplements.  Eat a healthy diet and maintain a healthy weight. A healthy diet includes low-fat dairy products, low-fat (lean) meats, and fiber from whole grains, beans, and lots of fruits and vegetables. Home safety  Remove any tripping hazards, such as rugs, cords, and clutter.  Install safety equipment such as grab bars in bathrooms and safety rails on stairs.  Keep rooms and walkways well-lit. Activity   Follow a regular exercise program to stay fit. This will help you maintain your balance. Ask your health care provider what types of exercise are appropriate for you.  If you need a cane or   walker, use it as recommended by your health care provider.  Wear supportive shoes that have nonskid soles. Lifestyle  Do not drink alcohol if your health care provider tells you not to drink.  If you drink alcohol, limit how much you have: ? 0-1 drink a day for women. ? 0-2 drinks a day for men.  Be aware of how much alcohol is in your drink. In the [place] one drink equals one typical bottle of beer (12  oz), one-half glass of wine (5 oz), or one shot of hard liquor (1 oz).  Do not use any products that contain nicotine or tobacco, such as cigarettes and e-cigarettes. If you need help quitting, ask your health care provider. Summary  Having a healthy lifestyle and getting preventive care can help to protect your health and wellness after age 49.  Screening and testing are the best way to find a health problem early and help you avoid having a fall. Early diagnosis and treatment give you the best chance for managing medical conditions that are more common for people who are older than age 22.  Falls are a major cause of broken bones and head injuries in people who are older than age 9. Take precautions to prevent a fall at home.  Work with your health care provider to learn what changes you can make to improve your health and wellness and to prevent falls. This information is not intended to replace advice given to you by your health care provider. Make sure you discuss any questions you have with your health care provider. Document Released: 09/13/2017 Document Revised: 09/13/2017 Document Reviewed: 09/13/2017 Elsevier Interactive Patient Education  2019 [employer] with Quitting Smoking  Quitting smoking is a physical and mental challenge. You will face cravings, withdrawal symptoms, and temptation. Before quitting, work with your health care provider to make a plan that can help you cope. Preparation can help you quit and keep you from giving in. How can I cope with cravings? Cravings usually last for 5-10 minutes. If you get through it, the craving will pass. Consider taking the following actions to help you cope with cravings:  Keep your mouth busy: ? Chew sugar-free gum. ? Suck on hard candies or a straw. ? Brush your teeth.  Keep your hands and body busy: ? Immediately change to a different activity when you feel a craving. ? Squeeze or play with a ball. ? Do an  activity or a hobby, like making bead jewelry, practicing needlepoint, or working with wood. ? Mix up your normal routine. ? Take a short exercise break. Go for a quick walk or run up and down stairs. ? Spend time in public places where smoking is not allowed.  Focus on doing something kind or helpful for someone else.  Call a friend or family member to talk during a craving.  Join a support group.  Call a quit line, such as 1-800-QUIT-NOW.  Talk with your health care provider about medicines that might help you cope with cravings and make quitting easier for you. How can I deal with withdrawal symptoms? Your body may experience negative effects as it tries to get used to not having nicotine in the system. These effects are called withdrawal symptoms. They may include:  Feeling hungrier than normal.  Trouble concentrating.  Irritability.  Trouble sleeping.  Feeling depressed.  Restlessness and agitation.  Craving a cigarette. To manage withdrawal symptoms:  Avoid places, people, and activities that trigger your cravings.  Remember why you want to quit.  Get plenty of sleep.  Avoid coffee and other caffeinated drinks. These may worsen some of your symptoms. How can I handle social situations? Social situations can be difficult when you are quitting smoking, especially in the first few weeks. To manage this, you can:  Avoid parties, bars, and other social situations where people might be smoking.  Avoid alcohol.  Leave right away if you have the urge to smoke.  Explain to your family and friends that you are quitting smoking. Ask for understanding and support.  Plan activities with friends or family where smoking is not an option. What are some ways I can cope with stress? Wanting to smoke may cause stress, and stress can make you want to smoke. Find ways to manage your stress. Relaxation techniques can help. For example:  Breathe slowly and deeply, in through your  nose and out through your mouth.  Listen to soothing, relaxing music.  Talk with a family member or friend about your stress.  Light a candle.  Soak in a bath or take a shower.  Think about a peaceful place. What are some ways I can prevent weight gain? Be aware that many people gain weight after they quit smoking. However, not everyone does. To keep from gaining weight, have a plan in place before you quit and stick to the plan after you quit. Your plan should include:  Having healthy snacks. When you have a craving, it may help to: ? Eat plain popcorn, crunchy carrots, celery, or other cut vegetables. ? Chew sugar-free gum.  Changing how you eat: ? Eat small portion sizes at meals. ? Eat 4-6 small meals throughout the day instead of 1-2 large meals a day. ? Be mindful when you eat. Do not watch television or do other things that might distract you as you eat.  Exercising regularly: ? Make time to exercise each day. If you do not have time for a long workout, do short bouts of exercise for 5-10 minutes several times a day. ? Do some form of strengthening exercise, like weight lifting, and some form of aerobic exercise, like running or swimming.  Drinking plenty of water or other low-calorie or no-calorie drinks. Drink 6-8 glasses of water daily, or as much as instructed by your health care provider. Summary  Quitting smoking is a physical and mental challenge. You will face cravings, withdrawal symptoms, and temptation to smoke again. Preparation can help you as you go through these challenges.  You can cope with cravings by keeping your mouth busy (such as by chewing gum), keeping your body and hands busy, and making calls to family, friends, or a helpline for people who want to quit smoking.  You can cope with withdrawal symptoms by avoiding places where people smoke, avoiding drinks with caffeine, and getting plenty of rest.  Ask your health care provider about the different  ways to prevent weight gain, avoid stress, and handle social situations. This information is not intended to replace advice given to you by your health care provider. Make sure you discuss any questions you have with your health care provider. Document Released: 10/28/2016 Document Revised: 10/28/2016 Document Reviewed: 10/28/2016 Elsevier Interactive Patient Education  2019 [employer].

## 2019-03-19 LAB — RPR: RPR Ser Ql: NONREACTIVE

## 2019-03-19 LAB — C. TRACHOMATIS/N. GONORRHOEAE RNA
C. trachomatis RNA, TMA: NOT DETECTED
N. gonorrhoeae RNA, TMA: NOT DETECTED

## 2019-03-19 LAB — HIV ANTIBODY (ROUTINE TESTING W REFLEX): HIV 1&2 Ab, 4th Generation: NONREACTIVE

## 2019-04-23 ENCOUNTER — Ambulatory Visit: Payer: PPO | Admitting: Internal Medicine

## 2019-04-23 NOTE — Progress Notes (Signed)
Virtual Visit via Video Note  I connected with@ on 04/24/19 at 11:30 AM EDT by a video enabled telemedicine application and verified that I am speaking with the correct person using two identifiers. Location patient: home Location provider:work or home office Persons participating in the virtual visit: patient, provider  [place]th national recommendations  regarding COVID 19 pandemic   video visit is advised over in office visit for this patient.  Patient aware  of the limitations of evaluation and management by telemedicine and  availability of in person appointments. and agreed to proceed.   HPI: Debra Woodward presents for video visit .     Had gyne visit  May 2019  And normal exam . And mammogram utd 12 19   Had onset 5 days ago of left nipple breast discomfort  And then increasing pain discharge and  Then redness and warmth  . expresses  Dc ( and hx of same)  Otherwise a nodule  occurs there   Using peroxide   In past has had less severe sx of such and often gets better with antibiotic she gets for her sinus infections.  ( last one   Cant recall when)   States she has had 3 breast procedure in the remote past on left breast   Related to milk ducts?  breast dc is usually yellow and now some cloudiness but no blood   ROS: See pertinent positives and negatives per HPI. No   Fever  Systemic sx  Pain is less than 2 days ago she described as excruciating   Past Medical History:  Diagnosis Date  . ALLERGIC RHINITIS 09/14/2006  . Allergy   . Fibroid   . Hx of adenomatous colonic polyps 07/28/2015  . Seasonal allergies     Past Surgical History:  Procedure Laterality Date  . ABDOMINAL HYSTERECTOMY  1996  . BREAST EXCISIONAL BIOPSY Left 1983   No scar seen  . BREAST EXCISIONAL BIOPSY Right 1987   NO scar seen  . BREAST SURGERY Bilateral 1983, 1996   benign  . CARPAL TUNNEL RELEASE Right 2008  . COLONOSCOPY  07/23/2015  . CYSTECTOMY Bilateral 1983, 1988   breast; benign  . KNEE  ARTHROSCOPY Right 2010  . TOE SURGERY Bilateral   . TOTAL KNEE ARTHROPLASTY Right 2011    Family History  Problem Relation Age of Onset  . Stroke Mother   . Diabetes Mother   . Diabetes Father   . Hypertension Sister   . Esophageal cancer Paternal Grandfather 66  . Colon cancer Neg Hx   . Stomach cancer Neg Hx   . Rectal cancer Neg Hx   . Breast cancer Neg Hx   . Colon polyps Neg Hx     Social History   Tobacco Use  . Smoking status: Current Every Day Smoker    Packs/day: 0.25    Types: Cigarettes  . Smokeless tobacco: Never Used  . Tobacco comment: 4 cigs a day (09/28/17)  Substance Use Topics  . Alcohol use: Yes    Alcohol/week: 2.0 standard drinks    Types: 2 Glasses of wine per week    Comment: social  . Drug use: No      Current Outpatient Medications:  .  Biotin 5 MG CAPS, Take 1 capsule by mouth daily., Disp: , Rfl:  .  carboxymethylcellulose 1 % ophthalmic solution, Apply 1 drop to eye daily., Disp: , Rfl:  .  cephALEXin (KEFLEX) 500 MG capsule, Take 1 capsule (500 mg total) by  mouth 4 (four) times daily for 7 days., Disp: 28 capsule, Rfl: 0 .  Cholecalciferol (VITAMIN D3) 2000 UNITS capsule, Take 2,000 Units by mouth daily., Disp: , Rfl:  .  fluticasone (FLONASE) 50 MCG/ACT nasal spray, , Disp: , Rfl: 5 .  loteprednol (LOTEMAX) 0.2 % SUSP, Place 1 drop into both eyes daily., Disp: , Rfl:  .  Multiple Vitamin (MULTIVITAMIN) tablet, Take 1 tablet by mouth daily.  , Disp: , Rfl:  .  NONFORMULARY OR COMPOUNDED ITEM, Apply 1-2 g topically daily. Wilcox: Nail laquer, Disp: 120 each, Rfl: 3  EXAM: BP Readings from Last 3 Encounters:  03/18/19 130/80  12/04/18 138/82  10/15/18 107/87    VITALS per patient if applicable: no reported fever   GENERAL: alert, oriented, appears well and in no acute distress  HEENT: atraumatic, conjunttiva clear, no obvious abnormalities on inspection of external nose and ears  NECK: normal movements of the head and  neck  LUNGS: on inspection no signs of respiratory distress, breathing rate appears normal, no obvious gross SOB, gasping or wheezing  CV: no obvious cyanosis  PSYCH/NEURO: pleasant and cooperative, no obvious depression or anxiety, speech and thought processing grossly intact   ASSESSMENT AND PLAN:  Discussed the following assessment and plan:  Breast discharge  Breast pain  Mastitis, left, acute ? Sounds like mastitis   Infection  And hx of recurrent discharge and nodule that comes and goes   empiric rx  Antibiotic warm compresses and make appt with [facility] team to examine  Breast in follow up 2 weeks or earlier if needed  and make plan as appropriate for  Possible more  imagining  ? [place] ? Ductogram?  Counseled.   Expectant management and discussion of plan and treatment with opportunity to ask questions and all were answered. The patient agreed with the plan and demonstrated an understanding of the instructions.   Advised to call back or seek an in-person evaluation if worsening  or having  further concerns . In the interim    Shanon Ace, MD

## 2019-04-24 ENCOUNTER — Telehealth: Payer: Self-pay | Admitting: *Deleted

## 2019-04-24 ENCOUNTER — Encounter: Payer: Self-pay | Admitting: Internal Medicine

## 2019-04-24 ENCOUNTER — Ambulatory Visit (INDEPENDENT_AMBULATORY_CARE_PROVIDER_SITE_OTHER): Payer: PPO | Admitting: Internal Medicine

## 2019-04-24 ENCOUNTER — Other Ambulatory Visit: Payer: Self-pay

## 2019-04-24 DIAGNOSIS — N6452 Nipple discharge: Secondary | ICD-10-CM

## 2019-04-24 DIAGNOSIS — N61 Mastitis without abscess: Secondary | ICD-10-CM | POA: Diagnosis not present

## 2019-04-24 DIAGNOSIS — N644 Mastodynia: Secondary | ICD-10-CM | POA: Diagnosis not present

## 2019-04-24 MED ORDER — CEPHALEXIN 500 MG PO CAPS: 500.0000 mg | ORAL_CAPSULE | Freq: Four times a day (QID) | ORAL | 0 refills | Status: AC

## 2019-04-24 NOTE — Telephone Encounter (Signed)
TC reports yellow discharge from nipple, states this has happened in the past with normal ultrasounds, last mammogram December 2019 normal.  Will take the antibiotic if drainage persists or nipple discharge bloody instructed to have office appointment will need for further testing.

## 2019-04-24 NOTE — Telephone Encounter (Signed)
Patient had Video call with Dr. Regis Bill note in epic due to breast discharge, she prescribed Kelfex 500 cap 4 times a day for 7 days. I spoke with patient and she has not started the medication yet, nipple still red, asked what you would like her to do, I did explained OV best for breast exam and possible imaging. Patient said this has happened before in past about 3 times, states normally takes antibiotic and squeeze the breast to get the discharge out. Please advise

## 2019-04-26 ENCOUNTER — Other Ambulatory Visit: Payer: Self-pay

## 2019-04-26 ENCOUNTER — Telehealth: Payer: Self-pay | Admitting: Women's Health

## 2019-04-26 DIAGNOSIS — H40023 Open angle with borderline findings, high risk, bilateral: Secondary | ICD-10-CM | POA: Diagnosis not present

## 2019-04-26 DIAGNOSIS — H04123 Dry eye syndrome of bilateral lacrimal glands: Secondary | ICD-10-CM | POA: Diagnosis not present

## 2019-04-26 DIAGNOSIS — H40033 Anatomical narrow angle, bilateral: Secondary | ICD-10-CM | POA: Diagnosis not present

## 2019-04-26 DIAGNOSIS — H1045 Other chronic allergic conjunctivitis: Secondary | ICD-10-CM | POA: Diagnosis not present

## 2019-04-26 NOTE — Telephone Encounter (Signed)
-----   Message from Burnis Medin, MD sent at 04/24/2019 11:53 AM EDT ----- Asking her to do a follow up  Breast exam with you in 1-2 weeks  Or if worse earlier Uncertain if needs imaging studies  Thanks [facility]

## 2019-04-26 NOTE — Telephone Encounter (Signed)
TC, left message for followup, instructed  to call for breast exam, was treated by [facility] with antibiotic for questionable infection,

## 2019-04-29 ENCOUNTER — Encounter: Payer: Self-pay | Admitting: Women's Health

## 2019-04-29 ENCOUNTER — Telehealth: Payer: Self-pay | Admitting: *Deleted

## 2019-04-29 ENCOUNTER — Ambulatory Visit: Payer: PPO | Admitting: Women's Health

## 2019-04-29 ENCOUNTER — Other Ambulatory Visit: Payer: Self-pay

## 2019-04-29 VITALS — BP 124/78

## 2019-04-29 DIAGNOSIS — N6452 Nipple discharge: Secondary | ICD-10-CM

## 2019-04-29 NOTE — Progress Notes (Signed)
68 year old SBF G3 P2 presents for follow-up.  Was treated by primary care with virtual visit on 04/24/2019 for questionable left breast nipple infection with white nipple discharge with  Keflex 500 mg 4 times daily for 7 days.  Reports breast discharge had no odor and milky appearance and first noticed a staining in her bra.  Initially left breast was painful, tender to touch, red.  Denies fever,  injury, or medication change.  States has had similar problems in the past greater than 15 years ago.  1996 TAH for fibroids on no HRT.  Half pack smoker daily.  Normal mammogram 10/2018.  No family history of breast cancer.  Medical problems include arthritis and degenerative disc disease.  Breast examined sitting and lying position.  Left breast areola firm to touch non-erythemic moderate amount of a white dry material exuded as well as a milky thin discharge.  Nontender  Questionable mastitis versus underlying breast problem  Plan: Ductogram, diagnostic mammogram with ultrasound as needed.  Instructed to complete antibiotics and keep breast clean and dry.

## 2019-04-29 NOTE — Telephone Encounter (Signed)
Patient scheduled at breast center on 05/07/19 @ 9:50am. Patient informed.

## 2019-04-29 NOTE — Telephone Encounter (Signed)
-----   Message from Huel Cote, NP sent at 04/29/2019 10:53 AM EDT ----- Had recent left breast nipple infection treated with keflex from PC, infection resolved, cystic white material expressed, no odor or tenderness and a white milky discharge.  Has had something similar in the past years and years ago. Nothing for > 15 years.  Needs either a ductogram or diagnostic mammogram and [place], breast center.  Thanks any time ok except June 30 am.  nancy

## 2019-04-30 ENCOUNTER — Other Ambulatory Visit: Payer: Self-pay | Admitting: Women's Health

## 2019-04-30 ENCOUNTER — Other Ambulatory Visit: Payer: PPO

## 2019-04-30 DIAGNOSIS — N6452 Nipple discharge: Secondary | ICD-10-CM | POA: Diagnosis not present

## 2019-05-01 LAB — PROLACTIN: Prolactin: 5.9 ng/mL

## 2019-05-07 ENCOUNTER — Other Ambulatory Visit: Payer: Self-pay

## 2019-05-07 ENCOUNTER — Other Ambulatory Visit: Payer: Self-pay | Admitting: Women's Health

## 2019-05-07 ENCOUNTER — Ambulatory Visit
Admission: RE | Admit: 2019-05-07 | Discharge: 2019-05-07 | Disposition: A | Discharge status: Home / Self Care | Payer: PPO | Source: Ambulatory Visit | Attending: Women's Health | Admitting: Women's Health

## 2019-05-07 DIAGNOSIS — N61 Mastitis without abscess: Secondary | ICD-10-CM | POA: Diagnosis not present

## 2019-05-07 DIAGNOSIS — N6452 Nipple discharge: Secondary | ICD-10-CM

## 2019-05-07 DIAGNOSIS — R928 Other abnormal and inconclusive findings on diagnostic imaging of breast: Secondary | ICD-10-CM | POA: Diagnosis not present

## 2019-05-07 MED ORDER — FLUCONAZOLE 150 MG PO TABS: 150.0000 mg | ORAL_TABLET | Freq: Once | ORAL | 1 refills | Status: AC

## 2019-05-14 ENCOUNTER — Encounter: Payer: Self-pay | Admitting: Podiatry

## 2019-05-14 ENCOUNTER — Ambulatory Visit (INDEPENDENT_AMBULATORY_CARE_PROVIDER_SITE_OTHER): Payer: PPO

## 2019-05-14 ENCOUNTER — Ambulatory Visit: Payer: PPO | Admitting: Podiatry

## 2019-05-14 ENCOUNTER — Other Ambulatory Visit: Payer: Self-pay

## 2019-05-14 VITALS — Temp 96.4°F

## 2019-05-14 DIAGNOSIS — B351 Tinea unguium: Secondary | ICD-10-CM | POA: Diagnosis not present

## 2019-05-14 DIAGNOSIS — M79672 Pain in left foot: Secondary | ICD-10-CM

## 2019-05-14 DIAGNOSIS — M79676 Pain in unspecified toe(s): Secondary | ICD-10-CM

## 2019-05-14 DIAGNOSIS — T847XXA Infection and inflammatory reaction due to other internal orthopedic prosthetic devices, implants and grafts, initial encounter: Secondary | ICD-10-CM | POA: Diagnosis not present

## 2019-05-14 NOTE — Patient Instructions (Signed)

## 2019-05-18 NOTE — Progress Notes (Signed)
Subjective:  Debra Woodward presents to clinic today with cc of  painful, thick, discolored, elongated toenails 1-5 b/l that become tender and cannot cut because of thickness. Pain is aggravated when wearing enclosed shoe gear and relieved with periodic professional debridement.  She relates she has pain in her left foot. She feels as if hardware has moved.  Panosh, [name], MD is her PCP. Last visit was 04/24/2019.   Current Outpatient Medications:  .  Biotin 5 MG CAPS, Take 1 capsule by mouth daily., Disp: , Rfl:  .  carboxymethylcellulose 1 % ophthalmic solution, Apply 1 drop to eye daily., Disp: , Rfl:  .  Cholecalciferol (VITAMIN D3) 2000 UNITS capsule, Take 2,000 Units by mouth daily., Disp: , Rfl:  .  diclofenac sodium (VOLTAREN) 1 % GEL, APPLY 2 GRAMS TOPICALLY TO AFFECTED AREA 4 TIMES DAILY, Disp: , Rfl:  .  fluconazole (DIFLUCAN) 150 MG tablet, TAKE ONE TABLET BY MOUTH AS A ONE TIME DOSE, Disp: , Rfl:  .  fluticasone (FLONASE) 50 MCG/ACT nasal spray, , Disp: , Rfl: 5 .  loteprednol (LOTEMAX) 0.2 % SUSP, Place 1 drop into both eyes daily., Disp: , Rfl:  .  Multiple Vitamin (MULTIVITAMIN) tablet, Take 1 tablet by mouth daily.  , Disp: , Rfl:  .  NONFORMULARY OR COMPOUNDED ITEM, Apply 1-2 g topically daily. [facility]: Nail laquer, Disp: 120 each, Rfl: 3   Allergies  Allergen Reactions  . Bee Venom Hives  . Prednisone Other (See Comments)    hallucinations     Objective: Vitals:   05/14/19 0824  Temp: (!) 96.4 F (35.8 C)    Physical Examination:  Vascular Examination: Capillary refill time immediate x 10 digits.  Palpable DP/PT pulses b/l.  Digital hair present b/l.  No edema noted b/l.  Skin temperature gradient WNL b/l.  Dermatological Examination: Skin with normal turgor, texture and tone b/l.  No open wounds b/l.  No interdigital macerations noted b/l.  Elongated, thick, discolored brittle toenails with subungual debris and pain on dorsal  palpation of nailbeds 1-5 b/l.  Musculoskeletal Examination: Muscle strength 5/5 to all muscle groups b/l.  She does have pain on palpation left 5th metatarsal base. There is no edema, no erythema, no flocculence.   No pain, crepitus or joint discomfort with active/passive ROM.  Neurological Examination: Sensation intact 5/5 b/l with 10 gram monofilament.  Vibratory sensation intact b/l.  Xrays left foot: Screw remains in place when compared to 2014 xray.   Assessment: Mycotic nail infection with pain 1-5 b/l Pain in foot left LE  Plan: 1. Toenails 1-5 b/l were debrided in length and girth without iatrogenic laceration. 2. Xrays taken and reviewed left foot with [name]. She will schedule appt with Dr. Milinda Pointer to discuss hardware and painful left foot. 3. Continue soft, supportive shoe gear daily. 4. Report any pedal injuries to medical professional. 5.  Follow up with me in 3 months. 6.  Patient/POA to call should there be a question/concern in there interim.

## 2019-06-13 ENCOUNTER — Ambulatory Visit: Payer: PPO | Admitting: Podiatry

## 2019-06-13 ENCOUNTER — Encounter: Payer: Self-pay | Admitting: Podiatry

## 2019-06-13 ENCOUNTER — Other Ambulatory Visit: Payer: Self-pay

## 2019-06-13 ENCOUNTER — Other Ambulatory Visit: Payer: Self-pay | Admitting: Podiatry

## 2019-06-13 VITALS — Temp 97.7°F

## 2019-06-13 DIAGNOSIS — T847XXD Infection and inflammatory reaction due to other internal orthopedic prosthetic devices, implants and grafts, subsequent encounter: Secondary | ICD-10-CM | POA: Diagnosis not present

## 2019-06-13 DIAGNOSIS — T847XXA Infection and inflammatory reaction due to other internal orthopedic prosthetic devices, implants and grafts, initial encounter: Secondary | ICD-10-CM

## 2019-06-13 NOTE — Patient Instructions (Signed)
Pre-Operative Instructions  Congratulations, you have decided to take an important step towards improving your quality of life.  You can be assured that the doctors and staff at [facility] will be with you every step of the way.  Here are some important things you should know:  1. Plan to be at the surgery center/hospital at least 1 (one) hour prior to your scheduled time, unless otherwise directed by the surgical center/hospital staff.  You must have a responsible adult accompany you, remain during the surgery and drive you home.  Make sure you have directions to the surgical center/hospital to ensure you arrive on time. 2. If you are having surgery at [facility] or [facility], you will need a copy of your medical history and physical form from your family physician within one month prior to the date of surgery. We will give you a form for your primary physician to complete.  3. We make every effort to accommodate the date you request for surgery.  However, there are times where surgery dates or times have to be moved.  We will contact you as soon as possible if a change in schedule is required.   4. No aspirin/ibuprofen for one week before surgery.  If you are on aspirin, any non-steroidal anti-inflammatory medications (Mobic, Aleve, Ibuprofen) should not be taken seven (7) days prior to your surgery.  You make take Tylenol for pain prior to surgery.  5. Medications - If you are taking daily heart and blood pressure medications, seizure, reflux, allergy, asthma, anxiety, pain or diabetes medications, make sure you notify the surgery center/hospital before the day of surgery so they can tell you which medications you should take or avoid the day of surgery. 6. No food or drink after midnight the night before surgery unless directed otherwise by surgical center/hospital staff. 7. No alcoholic beverages 24-hours prior to surgery.  No smoking 24-hours prior or 24-hours after  surgery. 8. Wear loose pants or shorts. They should be loose enough to fit over bandages, boots, and casts. 9. Don't wear slip-on shoes. Sneakers are preferred. 10. Bring your boot with you to the surgery center/hospital.  Also bring crutches or a walker if your physician has prescribed it for you.  If you do not have this equipment, it will be provided for you after surgery. 11. If you have not been contacted by the surgery center/hospital by the day before your surgery, call to confirm the date and time of your surgery. 12. Leave-time from work may vary depending on the type of surgery you have.  Appropriate arrangements should be made prior to surgery with your employer. 13. Prescriptions will be provided immediately following surgery by your doctor.  Fill these as soon as possible after surgery and take the medication as directed. Pain medications will not be refilled on weekends and must be approved by the doctor. 14. Remove nail polish on the operative foot and avoid getting pedicures prior to surgery. 15. Wash the night before surgery.  The night before surgery wash the foot and leg well with water and the antibacterial soap provided. Be sure to pay special attention to beneath the toenails and in between the toes.  Wash for at least three (3) minutes. Rinse thoroughly with water and dry well with a towel.  Perform this wash unless told not to do so by your physician.  Enclosed: 1 Ice pack (please put in freezer the night before surgery)   1 Hibiclens skin cleaner     Pre-op instructions  If you have any questions regarding the instructions, please do not hesitate to call our office.  [place]: [address], [place], [place] [address] -- [phone]  [place]: [address] [place], [place] [address] -- [phone]  [place]: [address]  [place], [place] [address] -- [phone]  [place]: [address], [address], [place],  [address] -- [phone]  Website:  [email or web address] 

## 2019-06-13 NOTE — Progress Notes (Signed)
She presents today for a surgical consult regarding her left foot fifth metatarsal.  She states that she saw another doctor who performed fifth metatarsal osteotomy and put the screw in.  She states that the screws backing out okay to wear my shoes.  She states that the right one which I performed several years ago has done wonderfully and she is very happy with that outcome.  ROS: Denies fever chills nausea vomiting muscle aches pains calf pain back pain chest pain shortness of breath.  No headache.  No change in medications.  No change in allergies.  Objective: Vital signs are stable she is alert and oriented x3 I have reviewed her past medical history medications allergies surgery social history reviewed medicines.  Radiographs reviewed demonstrate a extrusion of a 2.0 screw fifth metatarsal head left foot.  It appears of the fifth metatarsal osteotomy was performed.  The screw head is easily palpable today on physical exam.  No ulceration overlying the area no signs of infection.  Assessment: Painful internal fixation fifth metatarsal head left foot.  Plan: Discussed etiology pathology conservative versus surgical therapies at this point consented her today for a surgical removal of a screw from the fifth metatarsal head of the left foot.  She understands this and is amenable to it we did discuss the possible postop complications which may include but are not limited to postop pain bleeding swelling infection recurrence need for further surgery overcorrection under correction loss of digit loss of limb loss of life.  Dispensed a Darco shoe and instructions regarding preop and information regarding the surgery center.

## 2019-06-20 ENCOUNTER — Other Ambulatory Visit: Payer: Self-pay | Admitting: Podiatry

## 2019-06-20 MED ORDER — ONDANSETRON HCL 4 MG PO TABS: 4.0000 mg | ORAL_TABLET | Freq: Three times a day (TID) | ORAL | 0 refills | Status: DC | PRN

## 2019-06-20 MED ORDER — OXYCODONE-ACETAMINOPHEN 10-325 MG PO TABS: 1.0000 | ORAL_TABLET | Freq: Three times a day (TID) | ORAL | 0 refills | Status: AC | PRN

## 2019-06-20 MED ORDER — CEPHALEXIN 500 MG PO CAPS: 500.0000 mg | ORAL_CAPSULE | Freq: Three times a day (TID) | ORAL | 0 refills | Status: DC

## 2019-06-21 ENCOUNTER — Encounter: Payer: Self-pay | Admitting: Podiatry

## 2019-06-21 DIAGNOSIS — T8484XA Pain due to internal orthopedic prosthetic devices, implants and grafts, initial encounter: Secondary | ICD-10-CM | POA: Diagnosis not present

## 2019-06-21 DIAGNOSIS — M2012 Hallux valgus (acquired), left foot: Secondary | ICD-10-CM | POA: Diagnosis not present

## 2019-06-21 DIAGNOSIS — M21622 Bunionette of left foot: Secondary | ICD-10-CM | POA: Diagnosis not present

## 2019-06-21 DIAGNOSIS — Z4889 Encounter for other specified surgical aftercare: Secondary | ICD-10-CM | POA: Diagnosis not present

## 2019-06-21 DIAGNOSIS — M25572 Pain in left ankle and joints of left foot: Secondary | ICD-10-CM | POA: Diagnosis not present

## 2019-06-26 ENCOUNTER — Telehealth: Payer: Self-pay

## 2019-06-26 ENCOUNTER — Emergency Department (HOSPITAL_COMMUNITY)
Admission: EM | Admit: 2019-06-26 | Discharge: 2019-06-26 | Disposition: A | Discharge status: Home / Self Care | Payer: PPO | Attending: Emergency Medicine | Admitting: Emergency Medicine

## 2019-06-26 ENCOUNTER — Other Ambulatory Visit: Payer: Self-pay

## 2019-06-26 ENCOUNTER — Encounter (HOSPITAL_COMMUNITY): Payer: Self-pay | Admitting: Emergency Medicine

## 2019-06-26 ENCOUNTER — Emergency Department (HOSPITAL_COMMUNITY): Payer: PPO

## 2019-06-26 DIAGNOSIS — K5900 Constipation, unspecified: Secondary | ICD-10-CM | POA: Diagnosis not present

## 2019-06-26 DIAGNOSIS — F17210 Nicotine dependence, cigarettes, uncomplicated: Secondary | ICD-10-CM | POA: Insufficient documentation

## 2019-06-26 DIAGNOSIS — Z96651 Presence of right artificial knee joint: Secondary | ICD-10-CM | POA: Insufficient documentation

## 2019-06-26 LAB — COMPREHENSIVE METABOLIC PANEL
ALT: 44 U/L (ref 0–44)
AST: 27 U/L (ref 15–41)
Albumin: 4 g/dL (ref 3.5–5.0)
Alkaline Phosphatase: 73 U/L (ref 38–126)
Anion gap: 9 (ref 5–15)
BUN: 10 mg/dL (ref 8–23)
CO2: 25 mmol/L (ref 22–32)
Calcium: 9.6 mg/dL (ref 8.9–10.3)
Chloride: 106 mmol/L (ref 98–111)
Creatinine, Ser: 0.7 mg/dL (ref 0.44–1.00)
GFR calc Af Amer: >60 mL/min (ref >60)
GFR calc non Af Amer: >60 mL/min (ref >60)
Glucose, Bld: 108 mg/dL — ABNORMAL HIGH (ref 70–99)
Potassium: 3.5 mmol/L (ref 3.5–5.1)
Sodium: 140 mmol/L (ref 135–145)
Total Bilirubin: 0.4 mg/dL (ref 0.3–1.2)
Total Protein: 7.7 g/dL (ref 6.5–8.1)

## 2019-06-26 LAB — CBC WITH DIFFERENTIAL/PLATELET
Abs Immature Granulocytes: 0.03 10*3/uL (ref 0.00–0.07)
Basophils Absolute: 0 10*3/uL (ref 0.0–0.1)
Basophils Relative: 1 %
Eosinophils Absolute: 0.1 10*3/uL (ref 0.0–0.5)
Eosinophils Relative: 2 %
HCT: 40.2 % (ref 36.0–46.0)
Hemoglobin: 13.1 g/dL (ref 12.0–15.0)
Immature Granulocytes: 0 %
Lymphocytes Relative: 30 %
Lymphs Abs: 2.4 10*3/uL (ref 0.7–4.0)
MCH: 32 pg (ref 26.0–34.0)
MCHC: 32.6 g/dL (ref 30.0–36.0)
MCV: 98.3 fL (ref 80.0–100.0)
Monocytes Absolute: 1.1 10*3/uL — ABNORMAL HIGH (ref 0.1–1.0)
Monocytes Relative: 14 %
Neutro Abs: 4.4 10*3/uL (ref 1.7–7.7)
Neutrophils Relative %: 53 %
Platelets: 439 10*3/uL — ABNORMAL HIGH (ref 150–400)
RBC: 4.09 MIL/uL (ref 3.87–5.11)
RDW: 13.5 % (ref 11.5–15.5)
WBC: 8.1 10*3/uL (ref 4.0–10.5)
nRBC: 0 % (ref 0.0–0.2)

## 2019-06-26 MED ORDER — DOCUSATE SODIUM 100 MG PO CAPS: 100.0000 mg | ORAL_CAPSULE | Freq: Two times a day (BID) | ORAL | 0 refills | Status: AC

## 2019-06-26 MED ORDER — DOCUSATE SODIUM 100 MG PO CAPS
100.0000 mg | ORAL_CAPSULE | Freq: Once | ORAL | Status: DC
  Filled 2019-06-26: qty 1

## 2019-06-26 MED ORDER — IOHEXOL 300 MG/ML  SOLN
100.0000 mL | Freq: Once | INTRAMUSCULAR | Status: AC | PRN
  Administered 2019-06-26: 100 mL via INTRAVENOUS

## 2019-06-26 MED ORDER — POLYETHYLENE GLYCOL 3350 17 G PO PACK: 17.0000 g | PACK | Freq: Every day | ORAL | 0 refills | Status: DC

## 2019-06-26 MED ORDER — SODIUM CHLORIDE (PF) 0.9 % IJ SOLN
INTRAMUSCULAR | Status: AC
  Filled 2019-06-26: qty 50

## 2019-06-26 MED ORDER — MAGNESIUM CITRATE PO SOLN: 1.0000 | Freq: Once | ORAL | 0 refills | Status: AC

## 2019-06-26 NOTE — ED Triage Notes (Signed)
Pt reports that she has foot surgery last Friday and been taking pain medications. Pt reports been constipated and nothing relieving.

## 2019-06-26 NOTE — ED Notes (Signed)
My only role with this pt. Is to remove (d/c) from Epic.

## 2019-06-26 NOTE — ED Provider Notes (Signed)
[facility] DEPT Provider Note   CSN: [id number] Arrival date & time: 06/26/19  0756    History   Chief Complaint Chief Complaint  Patient presents with  . Constipation    HPI Debra Woodward is a 68 y.o. female with a past medical history of fibroids presents to ED for constipation.  States that she had outpatient surgery on her left foot on 06/21/2019 in which they removed a screw from her foot.  States that she was sedated for about 20 minutes.  She was prescribed oxycodone, Zofran and Keflex.  She took 1 dose each of the oxycodone and Zofran but has not taken it since.  She has tried a laxative, stool softener and increasing her hydration since her surgery but has only had one small bowel movement on 06/23/2019.  She reports worsening bilateral flank pain and back pain.  She has not been able to pass gas since after her surgery.  She has had surgeries in the past but has never had this issue with constipation.  She denies any fever, urinary symptoms, vomiting, history of bowel obstruction, shortness of breath or leg swelling.  Prior abdominal surgeries include hysterectomy over 10 years ago.    HPI  Past Medical History:  Diagnosis Date  . ALLERGIC RHINITIS 09/14/2006  . Allergy   . Fibroid   . Hx of adenomatous colonic polyps 07/28/2015  . Seasonal allergies     Patient Active Problem List   Diagnosis Date Noted  . Arthritis of both knees 11/15/2017  . Aftercare 11/15/2017  . Hx of adenomatous colonic polyps 07/28/2015  . Degenerative disc disease, lumbar sacral 11/13/2012  . Elevated blood pressure reading 11/13/2012  . ALLERGIC RHINITIS 09/14/2006    Past Surgical History:  Procedure Laterality Date  . ABDOMINAL HYSTERECTOMY  1996  . BREAST EXCISIONAL BIOPSY Left 1983   No scar seen  . BREAST EXCISIONAL BIOPSY Right 1987   NO scar seen  . BREAST SURGERY Bilateral 1983, 1996   benign  . CARPAL TUNNEL RELEASE Right 2008  . COLONOSCOPY   07/23/2015  . CYSTECTOMY Bilateral 1983, 1988   breast; benign  . KNEE ARTHROSCOPY Right 2010  . TOE SURGERY Bilateral   . TOTAL KNEE ARTHROPLASTY Right 2011     OB History    Gravida  3   Para  2   Term  2   Preterm      AB  1   Living  2     SAB      TAB      Ectopic      Multiple      Live Births               Home Medications    Prior to Admission medications   Medication Sig Start Date End Date Taking? Authorizing Provider  cephALEXin (KEFLEX) 500 MG capsule Take 1 capsule (500 mg total) by mouth 3 (three) times daily. 06/20/19  Yes Hyatt, Max T, DPM  Cholecalciferol (VITAMIN D3) 2000 UNITS capsule Take 2,000 Units by mouth daily.   Yes [provider]  diclofenac sodium (VOLTAREN) 1 % GEL Apply 2 g topically 4 (four) times daily.  01/16/19  Yes [provider]  fexofenadine (ALLEGRA) 60 MG tablet Take 60 mg by mouth daily.   Yes [provider]  Multiple Vitamin (MULTIVITAMIN) tablet Take 1 tablet by mouth daily.     Yes [provider]  NONFORMULARY OR COMPOUNDED ITEM Apply 1-2 g  topically daily. [facility]: Nail laquer 02/12/19  Yes Marzetta Board, DPM  olopatadine (PATADAY) 0.1 % ophthalmic solution 1 drop 2 (two) times daily.   Yes [provider]  ondansetron (ZOFRAN) 4 MG tablet Take 1 tablet (4 mg total) by mouth every 8 (eight) hours as needed. 06/20/19  Yes Hyatt, Max T, DPM  oxyCODONE-acetaminophen (PERCOCET) 10-325 MG tablet Take 1 tablet by mouth every 8 (eight) hours as needed for pain.   Yes [provider]  docusate sodium (COLACE) 100 MG capsule Take 1 capsule (100 mg total) by mouth every 12 (twelve) hours. 06/26/19   Savreen Gebhardt, PA-C  magnesium citrate SOLN Take 296 mLs (1 Bottle total) by mouth once for 1 dose. 06/26/19 06/26/19  Alante Tolan, PA-C  polyethylene glycol (MIRALAX / GLYCOLAX) 17 g packet Take 17 g by mouth daily. 06/26/19   Delia Heady, PA-C    Family History  Family History  Problem Relation Age of Onset  . Stroke Mother   . Diabetes Mother   . Diabetes Father   . Hypertension Sister   . Esophageal cancer Paternal Grandfather 51  . Colon cancer Neg Hx   . Stomach cancer Neg Hx   . Rectal cancer Neg Hx   . Breast cancer Neg Hx   . Colon polyps Neg Hx     Social History Social History   Tobacco Use  . Smoking status: Current Every Day Smoker    Packs/day: 0.25    Types: Cigarettes  . Smokeless tobacco: Never Used  . Tobacco comment: 4 cigs a day (09/28/17)  Substance Use Topics  . Alcohol use: Yes    Alcohol/week: 2.0 standard drinks    Types: 2 Glasses of wine per week    Comment: social  . Drug use: No     Allergies   Bee venom and Prednisone   Review of Systems Review of Systems  Constitutional: Negative for appetite change, chills and fever.  HENT: Negative for ear pain, rhinorrhea, sneezing and sore throat.   Eyes: Negative for photophobia and visual disturbance.  Respiratory: Negative for cough, chest tightness, shortness of breath and wheezing.   Cardiovascular: Negative for chest pain and palpitations.  Gastrointestinal: Positive for abdominal pain and constipation. Negative for blood in stool, diarrhea, nausea and vomiting.  Genitourinary: Negative for dysuria, hematuria and urgency.  Musculoskeletal: Positive for back pain. Negative for myalgias.  Skin: Negative for rash.  Neurological: Negative for dizziness, weakness and light-headedness.     Physical Exam Updated Vital Signs BP 113/81   Pulse 77   Temp 98.7 F (37.1 C) (Oral)   Resp 16   SpO2 100%   Physical Exam Vitals signs and nursing note reviewed.  Constitutional:      General: She is not in acute distress.    Appearance: She is well-developed.  HENT:     Head: Normocephalic and atraumatic.     Nose: Nose normal.  Eyes:     General: No scleral icterus.       Right eye: No discharge.        Left eye: No discharge.      Conjunctiva/sclera: Conjunctivae normal.  Neck:     Musculoskeletal: Normal range of motion and neck supple.  Cardiovascular:     Rate and Rhythm: Normal rate and regular rhythm.     Heart sounds: Normal heart sounds. No murmur. No friction rub. No gallop.   Pulmonary:     Effort: Pulmonary effort is normal. No respiratory  distress.     Breath sounds: Normal breath sounds.  Abdominal:     General: Bowel sounds are normal. There is no distension.     Palpations: Abdomen is soft.     Tenderness: There is no abdominal tenderness. There is no guarding.  Musculoskeletal: Normal range of motion.        General: Tenderness (paraspinal musculature of lumbar spine) present.  Skin:    General: Skin is warm and dry.     Findings: No rash.  Neurological:     Mental Status: She is alert.     Motor: No abnormal muscle tone.     Coordination: Coordination normal.      ED Treatments / Results  Labs (all labs ordered are listed, but only abnormal results are displayed) Labs Reviewed  COMPREHENSIVE METABOLIC PANEL - Abnormal; Notable for the following components:      Result Value   Glucose, Bld 108 (*)    All other components within normal limits  CBC WITH DIFFERENTIAL/PLATELET - Abnormal; Notable for the following components:   Platelets 439 (*)    Monocytes Absolute 1.1 (*)    All other components within normal limits    EKG None  Radiology Ct Abdomen Pelvis W Contrast  Result Date: 06/26/2019 CLINICAL DATA:  12ml omni300 "Pt reports that she has foot surgery last Friday and been taking pain medications. Pt reports been constipated and nothing relieving. " Hx fibroids w/ hysterectomy.^142mL OMNIPAQUE IOHEXOL 300 MG/ML SOLNCannot find appropriate structured reason for exam - see REASON FOR EXAM (FREE TEXT) post surgical constipation EXAM: CT ABDOMEN AND PELVIS WITH CONTRAST TECHNIQUE: Multidetector CT imaging of the abdomen and pelvis was performed using the standard protocol following  bolus administration of intravenous contrast. CONTRAST:  155mL OMNIPAQUE IOHEXOL 300 MG/ML  SOLN COMPARISON:  None FINDINGS: Lower chest: Mild bibasilar atelectasis Hepatobiliary: Multiple small hypodense lesions in the liver. 0 most consistent small benign cysts. Gallbladder normal. Normal common bile duct Pancreas: Pancreas is normal. No ductal dilatation. No pancreatic inflammation. Spleen: Normal spleen Adrenals/urinary tract: Adrenal glands normal. Simple fluid attenuation cyst of the RIGHT kidney measures 3.6 cm. No hydronephrosis. Ureters and bladder normal Stomach/Bowel: Stomach, small-bowel appendix cecum normal. Normal volume of stool in the ascending, transverse and descending colon. The rectosigmoid colon is collapsed. There is a moderate volume stool in the RIGHT colon compared to the LEFT. No colon inflammation. Vascular/Lymphatic: Abdominal aorta is normal caliber with atherosclerotic calcification. There is no retroperitoneal or periportal lymphadenopathy. No pelvic lymphadenopathy. Reproductive: Post hysterectomy Other: No free fluid. Musculoskeletal: No aggressive osseous lesion. Degenerate sclerosis and disc space narrowing in the lower lumbar spine. IMPRESSION: 1. Potential mild constipation with moderate volume stool in the RIGHT colon and collapsed LEFT colon. 2.  Aortic Atherosclerosis (ICD10-I70.0). Electronically Signed   By: Suzy Bouchard M.D.   On: 06/26/2019 16:06    Procedures Procedures (including critical care time)  Medications Ordered in ED Medications  sodium chloride (PF) 0.9 % injection (has no administration in time range)  docusate sodium (COLACE) capsule 100 mg (has no administration in time range)  iohexol (OMNIPAQUE) 300 MG/ML solution 100 mL (100 mLs Intravenous Contrast Given 06/26/19 1458)     Initial Impression / Assessment and Plan / ED Course  I have reviewed the triage vital signs and the nursing notes.  Pertinent labs & imaging results that were  available during my care of the patient were reviewed by me and considered in my medical decision making (see chart for  details).        68 year old female presents to ED for constipation.  She had outpatient foot surgery on 06/21/2019 states that she has not had a bowel movement that is been normal since then.  She has not been passing gas.  Denies any vomiting or fever.  Reports his abdomen is generally tender.  On exam abdomen is generally tender without rebound or guarding.  Her vital signs are within normal limits.  Lab work including CMP, CBC unremarkable.  CT of the abdomen pelvis with constipation on the right colon and collapsed left colon.  I spoke to the radiologist who states that he is not concerned about an obstruction.  Do not feel that an enema will help this patient.  She remains hemodynamically stable.  Will use aggressive bowel regimen with MiraLAX, Colace and if that fails, will give magnesium citrate.  We will have her follow-up with PCP and return for worsening symptoms.  Patient is hemodynamically stable, in NAD, and able to ambulate in the ED. Evaluation does not show pathology that would require ongoing emergent intervention or inpatient treatment. I explained the diagnosis to the patient. Pain has been managed and has no complaints prior to discharge. Patient is comfortable with above plan and is stable for discharge at this time. All questions were answered prior to disposition. Strict return precautions for returning to the ED were discussed. Encouraged follow up with PCP.   An After Visit Summary was printed and given to the patient.   Portions of this note were generated with [employer]. Dictation errors may occur despite best attempts at proofreading.   Final Clinical Impressions(s) / ED Diagnoses   Final diagnoses:  Constipation, unspecified constipation type    ED Discharge Orders         Ordered    polyethylene glycol (MIRALAX / GLYCOLAX) 17 g  packet  Daily     06/26/19 1638    docusate sodium (COLACE) 100 MG capsule  Every 12 hours     06/26/19 1638    magnesium citrate SOLN   Once     06/26/19 1638           Delia Heady, PA-C 06/26/19 1645    Gareth [place], MD 06/27/19 1620

## 2019-06-26 NOTE — Discharge Instructions (Addendum)
Take the MiraLAX 6 capfuls in a 32 ounce bottle of Gatorade today.  You can begin tapering down tomorrow with 4 capfuls then 2 capfuls.  You will need to also take the Colace.  If these medications do not work, take the magnesium citrate. Follow-up with your primary care provider. Return to the ED if you start to have worsening symptoms, develop a fever, vomiting or coughing up blood, lightheadedness or loss of consciousness.

## 2019-06-26 NOTE — Telephone Encounter (Signed)
Patient called after hours on call complaining of severe abdominal pain, constipation, and trapped gas.  She states that she has tried stool softener, laxative, and magnesium citrate.  She states she is also increased her water intake and has been trying to walk around to get things "moving ".  Patient states that she has not taken any of her pain medication and does understand why her bowels will not move.  I advised patient to go to the emergency room so that they can run further tests and evaluate her as to why she is having such severe abdominal pain.  She is to currently keep her postop appointment as scheduled with [place] for 06/27/2019.

## 2019-06-27 ENCOUNTER — Encounter: Payer: Self-pay | Admitting: Podiatry

## 2019-06-27 ENCOUNTER — Ambulatory Visit (INDEPENDENT_AMBULATORY_CARE_PROVIDER_SITE_OTHER): Payer: PPO

## 2019-06-27 ENCOUNTER — Ambulatory Visit (INDEPENDENT_AMBULATORY_CARE_PROVIDER_SITE_OTHER): Payer: PPO | Admitting: Podiatry

## 2019-06-27 DIAGNOSIS — T847XXD Infection and inflammatory reaction due to other internal orthopedic prosthetic devices, implants and grafts, subsequent encounter: Secondary | ICD-10-CM

## 2019-06-27 DIAGNOSIS — Z09 Encounter for follow-up examination after completed treatment for conditions other than malignant neoplasm: Secondary | ICD-10-CM

## 2019-06-27 NOTE — Progress Notes (Signed)
She presents today date of surgery June 21, 2019 with removal of fixation and attending bunionectomy fifth metatarsal left foot.  States my foot feels great but I have severe constipation with gas pain.  She has been seen in the ED for this she states that is not from the narcotic because I have not taken any of it.  Objective: Vital signs are stable alert oriented x3 dressed her dressing intact was removed demonstrates no erythema edema cellulitis drainage odor incision site is gone on to heal uneventfully it has hardly any appearance that there was even surgery performed.  Assessment: Well-healing surgical foot.  Plan: Redressed today dressed a compressive dressing keep dry and clean and elevated.  Instructed her to get back to the emergency department if she does not have a bowel movement in the next 24 hours.  And if her pain increases.  She will watch for signs and symptoms of infection increased abdominal pain and fever.  I doubt any of this would be coming from her foot.

## 2019-07-04 ENCOUNTER — Other Ambulatory Visit: Payer: Self-pay

## 2019-07-04 ENCOUNTER — Encounter: Payer: Self-pay | Admitting: Podiatry

## 2019-07-04 ENCOUNTER — Ambulatory Visit (INDEPENDENT_AMBULATORY_CARE_PROVIDER_SITE_OTHER): Payer: PPO | Admitting: Podiatry

## 2019-07-04 VITALS — Temp 97.3°F

## 2019-07-04 DIAGNOSIS — T847XXD Infection and inflammatory reaction due to other internal orthopedic prosthetic devices, implants and grafts, subsequent encounter: Secondary | ICD-10-CM | POA: Diagnosis not present

## 2019-07-04 DIAGNOSIS — Z09 Encounter for follow-up examination after completed treatment for conditions other than malignant neoplasm: Secondary | ICD-10-CM

## 2019-07-04 NOTE — Progress Notes (Signed)
She presents today for postop visit date of surgery June 21, 2019 removal fixation deep K wire screw and a partial tailor's bunionectomy.  She states that my foot is feeling great but I am still having stomach issues occasionally.  Objective: Vital signs are stable alert oriented x3.  Pulses are palpable.  There is no erythema just mild edema no cellulitis drainage odor sutures are intact margins were well coapted once the sutures were removed margins remain well coapted no signs of dehiscence.  Assessment: Well-healing surgical foot left.  Plan: Redressed today with a light dressing and allow her to change this or continue in a sock with her Darco shoe until she can get back into a regular shoe.  Follow-up with her in a couple weeks.

## 2019-07-05 ENCOUNTER — Ambulatory Visit: Payer: PPO | Admitting: Internal Medicine

## 2019-07-08 NOTE — Progress Notes (Signed)
No chief complaint on file.   Virtual Visit via Video Note  I connected with@ on 07/09/19 at 10:45 AM EDT by a video enabled telemedicine application and verified that I am speaking with the correct person using two identifiers. Location patient: home Location provider:work or home office Persons participating in the virtual visit: patient, provider  [place] national recommendations  regarding COVID 19 pandemic   video visit is advised over in office visit for this patient.  Patient aware  of the limitations of evaluation and management by telemedicine and  availability of in person appointments. and agreed to proceed.   HPI: Debra Woodward presents for video visit  Fu Ed .   Seen ed  8 12    For severe abd pain  Had foot surgery 8/7  That went well and only took one oxycodone  Her antibiotic keflex and one  zofran but no stool  No hx of Constipation  Like this and last colon was 2019  Following for polyp and risk     Lab unremarkable and  and ct showed no obstruction   But more stool in r than left colon  She had taken   Dulcolax and then mag citrate at some point        Feels her foot is fine now  Her nl bowel habits pre surgery  .  Has had left lower back pain responsive to topical Voltaren but no gu sx hematuria    She has now been eating oatmeal and probiotic yogurt and her lactacid pills and is hundry.  No vomiting fever  Other sx witht his .   Pain is now gone  Drinking lots of fluids   Afraid to eat normal again because of episode was so severe.   ROS: See pertinent positives and negatives per HPI.  Past Medical History:  Diagnosis Date  . ALLERGIC RHINITIS 09/14/2006  . Allergy   . Fibroid   . Hx of adenomatous colonic polyps 07/28/2015  . Seasonal allergies     Past Surgical History:  Procedure Laterality Date  . ABDOMINAL HYSTERECTOMY  1996  . BREAST EXCISIONAL BIOPSY Left 1983   No scar seen  . BREAST EXCISIONAL BIOPSY Right 1987   NO scar seen  . BREAST SURGERY  Bilateral 1983, 1996   benign  . CARPAL TUNNEL RELEASE Right 2008  . COLONOSCOPY  07/23/2015  . CYSTECTOMY Bilateral 1983, 1988   breast; benign  . KNEE ARTHROSCOPY Right 2010  . TOE SURGERY Bilateral   . TOTAL KNEE ARTHROPLASTY Right 2011    Family History  Problem Relation Age of Onset  . Stroke Mother   . Diabetes Mother   . Diabetes Father   . Hypertension Sister   . Esophageal cancer Paternal Grandfather 40  . Colon cancer Neg Hx   . Stomach cancer Neg Hx   . Rectal cancer Neg Hx   . Breast cancer Neg Hx   . Colon polyps Neg Hx     Social History   Tobacco Use  . Smoking status: Current Every Day Smoker    Packs/day: 0.25    Types: Cigarettes  . Smokeless tobacco: Never Used  . Tobacco comment: 4 cigs a day (09/28/17)  Substance Use Topics  . Alcohol use: Yes    Alcohol/week: 2.0 standard drinks    Types: 2 Glasses of wine per week    Comment: social  . Drug use: No      Current Outpatient Medications:  .  cephALEXin (KEFLEX) 500 MG capsule, Take 1 capsule (500 mg total) by mouth 3 (three) times daily., Disp: 30 capsule, Rfl: 0 .  Cholecalciferol (VITAMIN D3) 2000 UNITS capsule, Take 2,000 Units by mouth daily., Disp: , Rfl:  .  diclofenac sodium (VOLTAREN) 1 % GEL, Apply 2 g topically 4 (four) times daily. , Disp: , Rfl:  .  docusate sodium (COLACE) 100 MG capsule, Take 1 capsule (100 mg total) by mouth every 12 (twelve) hours., Disp: 30 capsule, Rfl: 0 .  fexofenadine (ALLEGRA) 60 MG tablet, Take 60 mg by mouth daily., Disp: , Rfl:  .  Multiple Vitamin (MULTIVITAMIN) tablet, Take 1 tablet by mouth daily.  , Disp: , Rfl:  .  NONFORMULARY OR COMPOUNDED ITEM, Apply 1-2 g topically daily. [facility]: Nail laquer, Disp: 120 each, Rfl: 3 .  olopatadine (PATADAY) 0.1 % ophthalmic solution, 1 drop 2 (two) times daily., Disp: , Rfl:  .  ondansetron (ZOFRAN) 4 MG tablet, Take 1 tablet (4 mg total) by mouth every 8 (eight) hours as needed., Disp: 20 tablet,  Rfl: 0 .  oxyCODONE-acetaminophen (PERCOCET) 10-325 MG tablet, Take 1 tablet by mouth every 8 (eight) hours as needed for pain., Disp: , Rfl:  .  polyethylene glycol (MIRALAX / GLYCOLAX) 17 g packet, Take 17 g by mouth daily., Disp: 14 each, Rfl: 0  EXAM: BP Readings from Last 3 Encounters:  06/26/19 (!) 147/90  04/29/19 124/78  03/18/19 130/80    VITALS per patient if applicable: GENERAL: alert, oriented, appears well and in no acute distress  HEENT: atraumatic, conjunttiva clear, no obvious abnormalities on inspection of external nose and ears  NECK: normal movements of the head and neck  LUNGS: on inspection no signs of respiratory distress, breathing rate appears normal, no obvious gross SOB, gasping or wheezing  CV: no obvious cyanosis  MS: moves all visible extremities without noticeable abnormality  PSYCH/NEURO: pleasant and cooperative, no obvious depression or anxiety, speech and thought processing grossly intact Lab Results  Component Value Date   WBC 8.1 06/26/2019   HGB 13.1 06/26/2019   HCT 40.2 06/26/2019   PLT 439 (H) 06/26/2019   GLUCOSE 108 (H) 06/26/2019   CHOL 160 09/18/2018   TRIG 102.0 09/18/2018   HDL 71.50 09/18/2018   LDLCALC 68 09/18/2018   ALT 44 06/26/2019   AST 27 06/26/2019   NA 140 06/26/2019   K 3.5 06/26/2019   CL 106 06/26/2019   CREATININE 0.70 06/26/2019   BUN 10 06/26/2019   CO2 25 06/26/2019   TSH 1.74 03/08/2017   INR 0.94 09/07/2010   HGBA1C 6.5 09/18/2018  IMPRESSION: 1. Potential mild constipation with moderate volume stool in the RIGHT colon and collapsed LEFT colon. 2.  Aortic Atherosclerosis (ICD10-I70.0).   Electronically Signed   By: Suzy Bouchard M.D.   On: 06/26/2019 16:06   ASSESSMENT AND PLAN:  Discussed the following assessment and plan:    ICD-10-CM   1. Abdominal pain, unspecified abdominal location  R10.9   2. Other constipation acute post op  K59.09    Reassuring  Ct and process  But now "is  now afraid to eat"   At this time encouraged to progress diet as she is hungry   This may have been a combo of anesthesia and one pain med and  Other ( she is lactose intolerant  But ok )   Get back with me in 2 weeks if cant say back to normal or almost for her  Add foods with fiber is ok.  If recurring  Get Dr Carlean Purl involved  She has hx of adenomatous polyps and had colon 2019  Has hx of  gyne surgeries  Antibiotic never bothered her before and no diarrhea  And ffot doing ok  Counseled.   Expectant management and discussion of plan and treatment with opportunity to ask questions and all were answered. The patient agreed with the plan and demonstrated an understanding of the instructions.   Advised to call back or seek an in-person evaluation if worsening  or having  further concerns . In interim    Shanon Ace, MD

## 2019-07-09 ENCOUNTER — Ambulatory Visit (INDEPENDENT_AMBULATORY_CARE_PROVIDER_SITE_OTHER): Payer: PPO | Admitting: Internal Medicine

## 2019-07-09 ENCOUNTER — Encounter: Payer: Self-pay | Admitting: Internal Medicine

## 2019-07-09 ENCOUNTER — Ambulatory Visit: Payer: PPO | Admitting: Internal Medicine

## 2019-07-09 ENCOUNTER — Other Ambulatory Visit: Payer: Self-pay

## 2019-07-09 DIAGNOSIS — K5909 Other constipation: Secondary | ICD-10-CM | POA: Diagnosis not present

## 2019-07-09 DIAGNOSIS — R109 Unspecified abdominal pain: Secondary | ICD-10-CM | POA: Diagnosis not present

## 2019-07-16 ENCOUNTER — Ambulatory Visit: Payer: PPO | Admitting: Women's Health

## 2019-07-16 ENCOUNTER — Other Ambulatory Visit: Payer: Self-pay

## 2019-07-16 ENCOUNTER — Encounter: Payer: Self-pay | Admitting: Women's Health

## 2019-07-16 VITALS — BP 122/80

## 2019-07-16 DIAGNOSIS — N898 Other specified noninflammatory disorders of vagina: Secondary | ICD-10-CM

## 2019-07-16 LAB — WET PREP FOR TRICH, YEAST, CLUE

## 2019-07-16 MED ORDER — METRONIDAZOLE 0.75 % VA GEL: VAGINAL | 0 refills | Status: DC

## 2019-07-16 NOTE — Progress Notes (Signed)
68 year old SBF G3, P2 presents with complaint of vaginal odor without discharge for the past few days.  Reports BV in the past with similar  odor.   Was given an antibiotic after surgery, only took 1 oxycodone after surgery..  Denies vaginal itching, urinary symptoms, vaginal pain, or fever.  Struggling with constipation since August 7 when she had an elective foot surgery under general anesthesia and has had problems since.  Was taking a stool softener daily, was seen at the hospital for severe abdominal pain due to the constipation on 8/12 and was prescribed MiraLAX and had only water after the MiraLAX and then had "formed stools daily August 25, 26, 27th and none since.  Appetite poor but states is eating.  No vomiting but did have nausea after surgery.  Passing flatus. Same partner.  1996 TAH for fibroids.  No known medical problems with review of records, smokes less than half pack daily and less recently.  Exam: Appears well.  No CVAT, abdomen soft, mild tenderness throughout, external genitalia within normal limits, speculum exam no visible discharge or erythema, wet prep negative.  Bimanual slight tenderness in the lower abdomen.  Vaginal odor/negative wet prep Constipation since surgery 06/21/2019 Smoker  Plan: Reviewed normality of exam and wet prep, reports odor unbearable, no relief with OTC refresh.  We will try MetroGel vaginal cream 1 applicator, instructed to call if continued problems with odor.  Reviewed importance of MiraLAX daily until regular stools keep scheduled follow-up with orthopedist.  Encouraged to quit smoking now that is only smoking 1 to 2 cigarettes daily since has not felt well.

## 2019-07-16 NOTE — Patient Instructions (Signed)
miralax daily until reg stools, and decrease amt of miralax daily  Constipation, Adult Constipation is when a person has fewer bowel movements in a week than normal, has difficulty having a bowel movement, or has stools that are dry, hard, or larger than normal. Constipation may be caused by an underlying condition. It may become worse with age if a person takes certain medicines and does not take in enough fluids. Follow these instructions at home: Eating and drinking   Eat foods that have a lot of fiber, such as fresh fruits and vegetables, whole grains, and beans.  Limit foods that are high in fat, low in fiber, or overly processed, such as french fries, hamburgers, cookies, candies, and soda.  Drink enough fluid to keep your urine clear or pale yellow. General instructions  Exercise regularly or as told by your health care provider.  Go to the restroom when you have the urge to go. Do not hold it in.  Take over-the-counter and prescription medicines only as told by your health care provider. These include any fiber supplements.  Practice pelvic floor retraining exercises, such as deep breathing while relaxing the lower abdomen and pelvic floor relaxation during bowel movements.  Watch your condition for any changes.  Keep all follow-up visits as told by your health care provider. This is important. Contact a health care provider if:  You have pain that gets worse.  You have a fever.  You do not have a bowel movement after 4 days.  You vomit.  You are not hungry.  You lose weight.  You are bleeding from the anus.  You have thin, pencil-like stools. Get help right away if:  You have a fever and your symptoms suddenly get worse.  You leak stool or have blood in your stool.  Your abdomen is bloated.  You have severe pain in your abdomen.  You feel dizzy or you faint. This information is not intended to replace advice given to you by your health care provider.  Make sure you discuss any questions you have with your health care provider. Document Released: 07/29/2004 Document Revised: 10/13/2017 Document Reviewed: 04/20/2016 Elsevier Patient Education  2020 [employer].

## 2019-07-18 ENCOUNTER — Ambulatory Visit (INDEPENDENT_AMBULATORY_CARE_PROVIDER_SITE_OTHER): Payer: PPO | Admitting: Podiatry

## 2019-07-18 ENCOUNTER — Other Ambulatory Visit: Payer: Self-pay

## 2019-07-18 ENCOUNTER — Encounter: Payer: Self-pay | Admitting: Podiatry

## 2019-07-18 DIAGNOSIS — T847XXD Infection and inflammatory reaction due to other internal orthopedic prosthetic devices, implants and grafts, subsequent encounter: Secondary | ICD-10-CM

## 2019-07-18 DIAGNOSIS — Z09 Encounter for follow-up examination after completed treatment for conditions other than malignant neoplasm: Secondary | ICD-10-CM

## 2019-07-18 NOTE — Progress Notes (Signed)
She presents today for follow-up of removal fixation K wire with a tailor's bunion repair fifth left.  States my foot is feeling great but I am having some stomach issues.  Objective: Vital signs are stable alert oriented x3.  There is no erythema edema cellulitis drainage odor is gone on to heal uneventfully.  Assessment: Well-healing surgical foot.  Plan: Follow-up with me as needed.

## 2019-08-01 ENCOUNTER — Other Ambulatory Visit: Payer: PPO | Admitting: Podiatry

## 2019-08-01 DIAGNOSIS — J310 Chronic rhinitis: Secondary | ICD-10-CM | POA: Diagnosis not present

## 2019-08-01 DIAGNOSIS — K1122 Acute recurrent sialoadenitis: Secondary | ICD-10-CM | POA: Diagnosis not present

## 2019-08-01 DIAGNOSIS — H6123 Impacted cerumen, bilateral: Secondary | ICD-10-CM | POA: Diagnosis not present

## 2019-08-13 ENCOUNTER — Encounter: Payer: Self-pay | Admitting: Podiatry

## 2019-08-13 ENCOUNTER — Other Ambulatory Visit: Payer: Self-pay

## 2019-08-13 ENCOUNTER — Ambulatory Visit: Payer: PPO | Admitting: Podiatry

## 2019-08-13 DIAGNOSIS — M79676 Pain in unspecified toe(s): Secondary | ICD-10-CM

## 2019-08-13 DIAGNOSIS — B351 Tinea unguium: Secondary | ICD-10-CM | POA: Diagnosis not present

## 2019-08-13 DIAGNOSIS — R600 Localized edema: Secondary | ICD-10-CM

## 2019-08-13 NOTE — Patient Instructions (Signed)

## 2019-08-14 NOTE — Progress Notes (Signed)
Subjective: Debra Woodward is seen today for follow up painful, elongated, thickened toenails 1-5 b/l feet that ahw cannot cut. Pain interferes with daily activities. Aggravating factor includes wearing enclosed shoe gear and relieved with periodic debridement.  She is concerned about swelling to her operative site left foot. She had surgery in August.  Current Outpatient Medications on File Prior to Visit  Medication Sig  . Cholecalciferol (VITAMIN D3) 2000 UNITS capsule Take 2,000 Units by mouth daily.  . diclofenac sodium (VOLTAREN) 1 % GEL Apply 2 g topically 4 (four) times daily.   Marland Kitchen docusate sodium (COLACE) 100 MG capsule Take 1 capsule (100 mg total) by mouth every 12 (twelve) hours.  . fexofenadine (ALLEGRA) 60 MG tablet Take 60 mg by mouth daily.  . metroNIDAZOLE (METROGEL VAGINAL) 0.75 % vaginal gel 1 applicator at bedtime for 5 nights and avoid ETOH  . Multiple Vitamin (MULTIVITAMIN) tablet Take 1 tablet by mouth daily.    . NONFORMULARY OR COMPOUNDED ITEM Apply 1-2 g topically daily. [facility]: Nail laquer  . olopatadine (PATADAY) 0.1 % ophthalmic solution 1 drop 2 (two) times daily.  . polyethylene glycol (MIRALAX / GLYCOLAX) 17 g packet Take 17 g by mouth daily.   No current facility-administered medications on file prior to visit.      Allergies  Allergen Reactions  . Bee Venom Hives  . Prednisone Other (See Comments)    hallucinations   Objective:  Vascular Examination: Capillary refill time immediate x 10 digits.  Dorsalis pedis present b/l.  Posterior tibial pulses present b/l.  Digital hair  present x 10 digits.  Skin temperature gradient WNL b/l.   Mild edema over surgical scar. No pitting. No warmth  Dermatological Examination: Skin with normal turgor, texture and tone b/l.  Well healed longitudinal surgical scar dorsal aspect of 5th metatarsal. Well coapted with no signs of infection. No erythema, no drainage, no flocculence, no warmth. There  is mild edema. No pain on palpation.  Toenails 1-5 b/l discolored, thick, dystrophic with subungual debris and pain with palpation to nailbeds due to thickness of nails.  Musculoskeletal: Muscle strength 5/5 to all LE muscle groups.  No gross bony deformities b/l.  No pain, crepitus or joint limitation noted with ROM.   Neurological Examination: Protective sensation intact with 10 gram monofilament bilaterally.  Epicritic sensation present bilaterally.  Vibratory sensation intact bilaterally.   Assessment: Painful onychomycosis toenails 1-5 b/l  Edema left foot  Plan: 1. Toenails 1-5 b/l were debrided in length and girth without iatrogenic bleeding. Continue using [employer] numbers 2. Patient to continue soft, supportive shoe gear. Regarding edema, informed Debra Woodward she is less than 60 days out from her surgery. Offered xray, but she declined on today. Explained to her she will have some swelling depending on type of shoe gear worn. She related understanding. Encouraged her to contact us should she experience any warmth, pain or more swelling than usual. Continue to wear her compression anklet daily.  3. Patient to report any pedal injuries to medical professional immediately. 4. Follow up 3 months.  5. Patient/POA to call should there be a concern in the interim.

## 2019-09-16 DIAGNOSIS — H00012 Hordeolum externum right lower eyelid: Secondary | ICD-10-CM | POA: Diagnosis not present

## 2019-09-23 ENCOUNTER — Encounter: Payer: Self-pay | Admitting: Internal Medicine

## 2019-09-23 ENCOUNTER — Other Ambulatory Visit: Payer: Self-pay

## 2019-09-23 ENCOUNTER — Ambulatory Visit (INDEPENDENT_AMBULATORY_CARE_PROVIDER_SITE_OTHER): Payer: PPO | Admitting: Internal Medicine

## 2019-09-23 VITALS — BP 138/80 | HR 86 | Temp 97.8°F | Ht 65.0 in | Wt 178.6 lb

## 2019-09-23 DIAGNOSIS — R1032 Left lower quadrant pain: Secondary | ICD-10-CM

## 2019-09-23 DIAGNOSIS — Z13220 Encounter for screening for lipoid disorders: Secondary | ICD-10-CM

## 2019-09-23 DIAGNOSIS — Z0000 Encounter for general adult medical examination without abnormal findings: Secondary | ICD-10-CM

## 2019-09-23 DIAGNOSIS — R739 Hyperglycemia, unspecified: Secondary | ICD-10-CM | POA: Diagnosis not present

## 2019-09-23 DIAGNOSIS — F17200 Nicotine dependence, unspecified, uncomplicated: Secondary | ICD-10-CM | POA: Diagnosis not present

## 2019-09-23 NOTE — Progress Notes (Signed)
Chief Complaint  Patient presents with  . Annual Exam    Pt states that she had fallen2 weeks ago and believes she pulled a muscle in her groin     HPI: Debra Woodward 68 y.o. comes in today for Preventive Medicare exam/ wellness visit .Since last visit.  Had  tomo left breast ok  Slipped on plastic bag on floor in October and didn't fall but some extension of legs  And since then has had  groin pain and medical thigh clicking   And favoring when walks  No direct fall  On hip.   Had   Home  Insurance  Visit and she   Had bp 150 range  Better now  Donates blood every 90 days and last time was jsut below cut off   No bleeding    Health Maintenance  Topic Date Due  . MAMMOGRAM  10/31/2020  . COLONOSCOPY  10/16/2023  . TETANUS/TDAP  01/26/2028  . INFLUENZA VACCINE  Completed  . DEXA SCAN  Completed  . Hepatitis C Screening  Completed  . PNA vac Low Risk Adult  Completed   Health Maintenance Review LIFESTYLE:  Exercise:   Walks around yard   Tobacco/ETS:  Greatly reduded   About every 8 days  Alcohol:   On weekends  Sugar beverages: rare  Sleep:   6 hours  Drug use: no HH:  1  No pets    Hearing:  [place]:  No limitations at present . Last eye check UTD   Eye lash duct  Infected .   Safety:  Has smoke detector and wears seat belts.  No excess sun exposure. Sees dentist regularly.  Falls:  Slip  No fall   Memory: Felt to be good  , no concern from her or her family.   Depression: No anhedonia unusual crying or depressive symptoms  Nutrition: Eats well balanced diet; adequate calcium and vitamin D. No swallowing chewing problems. 2 meals per day .   Injury: no major injuries in the last six months.  Other healthcare providers:  Reviewed today .  Preventive parameters:   Reviewed   ADLS:   There are no problems or need for assistance  driving, feeding, obtaining food, dressing, toileting and bathing, managing money using phone. She is independent.    ROS:   GEN/ HEENT: No fever, significant weight changes sweats headaches vision problems hearing changes, CV/ PULM; No chest pain shortness of breath cough, syncope,edema  change in exercise tolerance. GI /GU: No adominal pain, vomiting, change in bowel habits. No blood in the stool. No significant GU symptoms. SKIN/HEME: ,no acute skin rashes suspicious lesions or bleeding. No lymphadenopathy, nodules, masses.  NEURO/ PSYCH:  No neurologic signs such as weakness numbness. No depression anxiety. IMM/ Allergy: No unusual infections.  Allergy .   REST of 12 system review negative except as per HPI   Past Medical History:  Diagnosis Date  . ALLERGIC RHINITIS 09/14/2006  . Allergy   . Fibroid   . Hx of adenomatous colonic polyps 07/28/2015  . Seasonal allergies     Family History  Problem Relation Age of Onset  . Stroke Mother   . Diabetes Mother   . Diabetes Father   . Hypertension Sister   . Esophageal cancer Paternal Grandfather 87  . Colon cancer Neg Hx   . Stomach cancer Neg Hx   . Rectal cancer Neg Hx   . Breast cancer Neg Hx   . Colon  polyps Neg Hx     Social History   Socioeconomic History  . Marital status: Single    Spouse name: Not on file  . Number of children: Not on file  . Years of education: Not on file  . Highest education level: Not on file  Occupational History  . Not on file  Social Needs  . Financial resource strain: Not on file  . Food insecurity    Worry: Not on file    Inability: Not on file  . Transportation needs    Medical: Not on file    Non-medical: Not on file  Tobacco Use  . Smoking status: Current Every Day Smoker    Packs/day: 0.25    Types: Cigarettes  . Smokeless tobacco: Never Used  . Tobacco comment: 4 cigs a day (09/28/17)  Substance and Sexual Activity  . Alcohol use: Yes    Alcohol/week: 2.0 standard drinks    Types: 2 Glasses of wine per week    Comment: social  . Drug use: No  . Sexual activity: Yes    Birth  control/protection: Surgical    Comment: HYST  Lifestyle  . Physical activity    Days per week: Not on file    Minutes per session: Not on file  . Stress: Not on file  Relationships  . Social Herbalist on phone: Not on file    Gets together: Not on file    Attends religious service: Not on file    Active member of club or organization: Not on file    Attends meetings of clubs or organizations: Not on file    Relationship status: Not on file  Other Topics Concern  . 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     Outpatient Encounter Medications as of 09/23/2019  Medication Sig  . Cholecalciferol (VITAMIN D3) 2000 UNITS capsule Take 2,000 Units by mouth daily.  Marland Kitchen docusate sodium (COLACE) 100 MG capsule Take 1 capsule (100 mg total) by mouth every 12 (twelve) hours.  . fexofenadine (ALLEGRA) 60 MG tablet Take 60 mg by mouth daily.  . Multiple Vitamin (MULTIVITAMIN) tablet Take 1 tablet by mouth daily.    Marland Kitchen olopatadine (PATADAY) 0.1 % ophthalmic solution 1 drop 2 (two) times daily.  . polyethylene glycol (MIRALAX / GLYCOLAX) 17 g packet Take 17 g by mouth daily.  . diclofenac sodium (VOLTAREN) 1 % GEL Apply 2 g topically 4 (four) times daily.   . metroNIDAZOLE (METROGEL VAGINAL) 0.75 % vaginal gel 1 applicator at bedtime for 5 nights and avoid ETOH (Patient not taking: Reported on 09/23/2019)  . NONFORMULARY OR COMPOUNDED ITEM Apply 1-2 g topically daily. [place]: Nail laquer (Patient not taking: Reported on 09/23/2019)   No facility-administered encounter medications on file as of 09/23/2019.     EXAM:  BP 138/80 (BP Location: Right Arm, Patient Position: Sitting, Cuff Size: Normal)   Pulse 86   Temp 97.8 F (36.6 C) (Temporal)   Ht 5\' 5"  (1.651 m)   Wt 178 lb 9.6 oz (81 kg)   SpO2 99%   BMI 29.72 kg/m   Body mass index is 29.72 kg/m.  Physical Exam: Vital signs  reviewed [id number] is a well-developed well-nourished alert cooperative   who appears stated age in no acute distress.  HEENT: normocephalic atraumatic , Eyes: PERRL EOM's full, conjunctiva clear, ., Ears: no deformity EAC's clear TMs with normal landmarks. Mouth: clear OP masked NECK: supple without masses, thyromegaly or bruits. CHEST/PULM:  Clear to auscultation and percussion breath sounds equal no wheeze , rales or rhonchi. No chest wall deformities or tenderness.Breast: normal by inspection . No dimpling, discharge, masses, tenderness or discharge . CV: PMI is nondisplaced, S1 S2 no gallops, murmurs, rubs. Peripheral pulses are full without delay.No JVD .  ABDOMEN: Bowel sounds normal nontender  No guard or rebound, no hepato splenomegal no CVA tenderness.   Extremtities:  No clubbing cyanosis or edema, no acute joint swelling or redness no focal atrophy healed right knee  Scar  Mild limp favoring and no bruising  Point tenderness  NEURO:  Oriented x3, cranial nerves 3-12 appear to be intact, no obvious focal weakness,gait antalgic favoring left  no abnormal reflexes or asymmetrical SKIN: No acute rashes normal turgor, color, no bruising or petechiae. PSYCH: Oriented, good eye contact, no obvious depression anxiety, cognition and judgment appear normal. LN: no cervical axillary inguinal adenopathy No noted deficits in memory, attention, and speech. Wt Readings from Last 3 Encounters:  09/23/19 178 lb 9.6 oz (81 kg)  03/18/19 179 lb (81.2 kg)  12/04/18 182 lb 11.2 oz (82.9 kg)   Lab Results  Component Value Date   WBC 8.1 06/26/2019   HGB 13.1 06/26/2019   HCT 40.2 06/26/2019   PLT 439 (H) 06/26/2019   GLUCOSE 108 (H) 06/26/2019   CHOL 160 09/18/2018   TRIG 102.0 09/18/2018   HDL 71.50 09/18/2018   LDLCALC 68 09/18/2018   ALT 44 06/26/2019   AST 27 06/26/2019   NA 140 06/26/2019   K 3.5 06/26/2019   CL 106 06/26/2019   CREATININE 0.70 06/26/2019   BUN 10 06/26/2019   CO2 25  06/26/2019   TSH 1.74 03/08/2017   INR 0.94 09/07/2010   HGBA1C 6.5 09/18/2018    ASSESSMENT AND PLAN:  Discussed the following assessment and plan:  Visit for preventive health examination  Left groin pain  - hx slipping. acute strain continiuing - Plan: Basic metabolic panel, CBC with Differential, Hemoglobin A1c, Lipid panel, TSH, Ambulatory referral to Sports Medicine [facility]  Hyperglycemia - borderline - Plan: Basic metabolic panel, CBC with Differential, Hemoglobin A1c, Lipid panel, TSH  Screening, lipid - Plan: Lipid panel  Tobacco use disorder - Plan: Basic metabolic panel, CBC with Differential, Hemoglobin A1c, Lipid panel, TSH Borderline  Blood sugar in past   Advise d/c tobacco   Make sure bp is in range  Was ok today  Continue to lose weight in healthy manner.   Patient Care Team: Mckensey Berghuis, [name], MD as PCP - General (Internal Medicine) Huel Cote, NP as Nurse Practitioner (Obstetrics and Gynecology)  Patient Instructions  Will get sports medicine to see your about your groin and hip pain   That is continuing.   Continue lifestyle intervention healthy eating and exercise .    Will notify you  of labs when available.   Stop tobacco     Health Maintenance, Female Adopting a healthy lifestyle and getting preventive care are important in promoting health and wellness. Ask your health care provider about:  The right schedule for you to have regular tests and exams.  Things you can do on your own to prevent diseases and keep yourself healthy. What should I know about diet, weight, and exercise? Eat a healthy diet   Eat  a diet that includes plenty of vegetables, fruits, low-fat dairy products, and lean protein.  Do not eat a lot of foods that are high in solid fats, added sugars, or sodium. Maintain a healthy weight Body mass index (BMI) is used to identify weight problems. It estimates body fat based on height and weight. Your health care provider can help  determine your BMI and help you achieve or maintain a healthy weight. Get regular exercise Get regular exercise. This is one of the most important things you can do for your health. Most adults should:  Exercise for at least 150 minutes each week. The exercise should increase your heart rate and make you sweat (moderate-intensity exercise).  Do strengthening exercises at least twice a week. This is in addition to the moderate-intensity exercise.  Spend less time sitting. Even light physical activity can be beneficial. Watch cholesterol and blood lipids Have your blood tested for lipids and cholesterol at 68 years of age, then have this test every 5 years. Have your cholesterol levels checked more often if:  Your lipid or cholesterol levels are high.  You are older than 68 years of age.  You are at high risk for heart disease. What should I know about cancer screening? Depending on your health history and family history, you may need to have cancer screening at various ages. This may include screening for:  Breast cancer.  Cervical cancer.  Colorectal cancer.  Skin cancer.  Lung cancer. What should I know about heart disease, diabetes, and high blood pressure? Blood pressure and heart disease  High blood pressure causes heart disease and increases the risk of stroke. This is more likely to develop in people who have high blood pressure readings, are of African descent, or are overweight.  Have your blood pressure checked: ? Every 3-5 years if you are 24-35 years of age. ? Every year if you are 28 years old or older. Diabetes Have regular diabetes screenings. This checks your fasting blood sugar level. Have the screening done:  Once every three years after age 41 if you are at a normal weight and have a low risk for diabetes.  More often and at a younger age if you are overweight or have a high risk for diabetes. What should I know about preventing infection? Hepatitis B  If you have a higher risk for hepatitis B, you should be screened for this virus. Talk with your health care provider to find out if you are at risk for hepatitis B infection. Hepatitis C Testing is recommended for:  Everyone born from [birth year] through 1965.  Anyone with known risk factors for hepatitis C. Sexually transmitted infections (STIs)  Get screened for STIs, including gonorrhea and chlamydia, if: ? You are sexually active and are younger than 68 years of age. ? You are older than 68 years of age and your health care provider tells you that you are at risk for this type of infection. ? Your sexual activity has changed since you were last screened, and you are at increased risk for chlamydia or gonorrhea. Ask your health care provider if you are at risk.  Ask your health care provider about whether you are at high risk for HIV. Your health care provider may recommend a prescription medicine to help prevent HIV infection. If you choose to take medicine to prevent HIV, you should first get tested for HIV. You should then be tested every 3 months for as long as you are taking the medicine.  Pregnancy  If you are about to stop having your period (premenopausal) and you may become pregnant, seek counseling before you get pregnant.  Take 400 to 800 micrograms (mcg) of folic acid every day if you become pregnant.  Ask for birth control (contraception) if you want to prevent pregnancy. Osteoporosis and menopause Osteoporosis is a disease in which the bones lose minerals and strength with aging. This can result in bone fractures. If you are 44 years old or older, or if you are at risk for osteoporosis and fractures, ask your health care provider if you should:  Be screened for bone loss.  Take a calcium or vitamin D supplement to lower your risk of fractures.  Be given hormone replacement therapy (HRT) to treat symptoms of menopause. Follow these instructions at home: Lifestyle  Do not  use any products that contain nicotine or tobacco, such as cigarettes, e-cigarettes, and chewing tobacco. If you need help quitting, ask your health care provider.  Do not use street drugs.  Do not share needles.  Ask your health care provider for help if you need support or information about quitting drugs. Alcohol use  Do not drink alcohol if: ? Your health care provider tells you not to drink. ? You are pregnant, may be pregnant, or are planning to become pregnant.  If you drink alcohol: ? Limit how much you use to 0-1 drink a day. ? Limit intake if you are breastfeeding.  Be aware of how much alcohol is in your drink. In the [place] one drink equals one 12 oz bottle of beer (355 mL), one 5 oz glass of wine (148 mL), or one 1 oz glass of hard liquor (44 mL). General instructions  Schedule regular health, dental, and eye exams.  Stay current with your vaccines.  Tell your health care provider if: ? You often feel depressed. ? You have ever been abused or do not feel safe at home. Summary  Adopting a healthy lifestyle and getting preventive care are important in promoting health and wellness.  Follow your health care provider's instructions about healthy diet, exercising, and getting tested or screened for diseases.  Follow your health care provider's instructions on monitoring your cholesterol and blood pressure. This information is not intended to replace advice given to you by your health care provider. Make sure you discuss any questions you have with your health care provider. Document Released: 05/16/2011 Document Revised: 10/24/2018 Document Reviewed: 10/24/2018 Elsevier Patient Education  2020 Delia Gamble Enderle M.D.

## 2019-09-23 NOTE — Patient Instructions (Signed)
Will get sports medicine to see your about your groin and hip pain   That is continuing.   Continue lifestyle intervention healthy eating and exercise .    Will notify you  of labs when available.   Stop tobacco     Health Maintenance, Female Adopting a healthy lifestyle and getting preventive care are important in promoting health and wellness. Ask your health care provider about:  The right schedule for you to have regular tests and exams.  Things you can do on your own to prevent diseases and keep yourself healthy. What should I know about diet, weight, and exercise? Eat a healthy diet   Eat a diet that includes plenty of vegetables, fruits, low-fat dairy products, and lean protein.  Do not eat a lot of foods that are high in solid fats, added sugars, or sodium. Maintain a healthy weight Body mass index (BMI) is used to identify weight problems. It estimates body fat based on height and weight. Your health care provider can help determine your BMI and help you achieve or maintain a healthy weight. Get regular exercise Get regular exercise. This is one of the most important things you can do for your health. Most adults should:  Exercise for at least 150 minutes each week. The exercise should increase your heart rate and make you sweat (moderate-intensity exercise).  Do strengthening exercises at least twice a week. This is in addition to the moderate-intensity exercise.  Spend less time sitting. Even light physical activity can be beneficial. Watch cholesterol and blood lipids Have your blood tested for lipids and cholesterol at 68 years of age, then have this test every 5 years. Have your cholesterol levels checked more often if:  Your lipid or cholesterol levels are high.  You are older than 68 years of age.  You are at high risk for heart disease. What should I know about cancer screening? Depending on your health history and family history, you may need to have  cancer screening at various ages. This may include screening for:  Breast cancer.  Cervical cancer.  Colorectal cancer.  Skin cancer.  Lung cancer. What should I know about heart disease, diabetes, and high blood pressure? Blood pressure and heart disease  High blood pressure causes heart disease and increases the risk of stroke. This is more likely to develop in people who have high blood pressure readings, are of African descent, or are overweight.  Have your blood pressure checked: ? Every 3-5 years if you are 46-17 years of age. ? Every year if you are 16 years old or older. Diabetes Have regular diabetes screenings. This checks your fasting blood sugar level. Have the screening done:  Once every three years after age 68 if you are at a normal weight and have a low risk for diabetes.  More often and at a younger age if you are overweight or have a high risk for diabetes. What should I know about preventing infection? Hepatitis B If you have a higher risk for hepatitis B, you should be screened for this virus. Talk with your health care provider to find out if you are at risk for hepatitis B infection. Hepatitis C Testing is recommended for:  Everyone born from [birth year] through 1965.  Anyone with known risk factors for hepatitis C. Sexually transmitted infections (STIs)  Get screened for STIs, including gonorrhea and chlamydia, if: ? You are sexually active and are younger than 68 years of age. ? You are older than 68  years of age and your health care provider tells you that you are at risk for this type of infection. ? Your sexual activity has changed since you were last screened, and you are at increased risk for chlamydia or gonorrhea. Ask your health care provider if you are at risk.  Ask your health care provider about whether you are at high risk for HIV. Your health care provider may recommend a prescription medicine to help prevent HIV infection. If you choose to take  medicine to prevent HIV, you should first get tested for HIV. You should then be tested every 3 months for as long as you are taking the medicine. Pregnancy  If you are about to stop having your period (premenopausal) and you may become pregnant, seek counseling before you get pregnant.  Take 400 to 800 micrograms (mcg) of folic acid every day if you become pregnant.  Ask for birth control (contraception) if you want to prevent pregnancy. Osteoporosis and menopause Osteoporosis is a disease in which the bones lose minerals and strength with aging. This can result in bone fractures. If you are 39 years old or older, or if you are at risk for osteoporosis and fractures, ask your health care provider if you should:  Be screened for bone loss.  Take a calcium or vitamin D supplement to lower your risk of fractures.  Be given hormone replacement therapy (HRT) to treat symptoms of menopause. Follow these instructions at home: Lifestyle  Do not use any products that contain nicotine or tobacco, such as cigarettes, e-cigarettes, and chewing tobacco. If you need help quitting, ask your health care provider.  Do not use street drugs.  Do not share needles.  Ask your health care provider for help if you need support or information about quitting drugs. Alcohol use  Do not drink alcohol if: ? Your health care provider tells you not to drink. ? You are pregnant, may be pregnant, or are planning to become pregnant.  If you drink alcohol: ? Limit how much you use to 0-1 drink a day. ? Limit intake if you are breastfeeding.  Be aware of how much alcohol is in your drink. In the [place] one drink equals one 12 oz bottle of beer (355 mL), one 5 oz glass of wine (148 mL), or one 1 oz glass of hard liquor (44 mL). General instructions  Schedule regular health, dental, and eye exams.  Stay current with your vaccines.  Tell your health care provider if: ? You often feel depressed. ? You have  ever been abused or do not feel safe at home. Summary  Adopting a healthy lifestyle and getting preventive care are important in promoting health and wellness.  Follow your health care provider's instructions about healthy diet, exercising, and getting tested or screened for diseases.  Follow your health care provider's instructions on monitoring your cholesterol and blood pressure. This information is not intended to replace advice given to you by your health care provider. Make sure you discuss any questions you have with your health care provider. Document Released: 05/16/2011 Document Revised: 10/24/2018 Document Reviewed: 10/24/2018 Elsevier Patient Education  2020 [employer].

## 2019-09-24 LAB — BASIC METABOLIC PANEL
BUN: 8 mg/dL (ref 6–23)
CO2: 29 mEq/L (ref 19–32)
Calcium: 9.8 mg/dL (ref 8.4–10.5)
Chloride: 104 mEq/L (ref 96–112)
Creatinine, Ser: 0.67 mg/dL (ref 0.40–1.20)
GFR: 105.91 mL/min (ref >60.00)
Glucose, Bld: 102 mg/dL — ABNORMAL HIGH (ref 70–99)
Potassium: 4.1 mEq/L (ref 3.5–5.1)
Sodium: 138 mEq/L (ref 135–145)

## 2019-09-24 LAB — CBC WITH DIFFERENTIAL/PLATELET
Basophils Absolute: 0.1 10*3/uL (ref 0.0–0.1)
Basophils Relative: 0.9 % (ref 0.0–3.0)
Eosinophils Absolute: 0.1 10*3/uL (ref 0.0–0.7)
Eosinophils Relative: 1.3 % (ref 0.0–5.0)
HCT: 39 % (ref 36.0–46.0)
Hemoglobin: 12.9 g/dL (ref 12.0–15.0)
Lymphocytes Relative: 37.8 % (ref 12.0–46.0)
Lymphs Abs: 2.5 10*3/uL (ref 0.7–4.0)
MCHC: 33.2 g/dL (ref 30.0–36.0)
MCV: 94.1 fl (ref 78.0–100.0)
Monocytes Absolute: 0.6 10*3/uL (ref 0.1–1.0)
Monocytes Relative: 8.5 % (ref 3.0–12.0)
Neutro Abs: 3.4 10*3/uL (ref 1.4–7.7)
Neutrophils Relative %: 51.5 % (ref 43.0–77.0)
Platelets: 371 10*3/uL (ref 150.0–400.0)
RBC: 4.14 Mil/uL (ref 3.87–5.11)
RDW: 16.1 % — ABNORMAL HIGH (ref 11.5–15.5)
WBC: 6.5 10*3/uL (ref 4.0–10.5)

## 2019-09-24 LAB — LIPID PANEL
Cholesterol: 161 mg/dL (ref 0–200)
HDL: 75 mg/dL (ref >39.00)
LDL Cholesterol: 69 mg/dL (ref 0–99)
NonHDL: 85.68
Total CHOL/HDL Ratio: 2
Triglycerides: 85 mg/dL (ref 0.0–149.0)
VLDL: 17 mg/dL (ref 0.0–40.0)

## 2019-09-24 LAB — HEMOGLOBIN A1C: Hgb A1c MFr Bld: 6.4 % (ref 4.6–6.5)

## 2019-09-24 LAB — TSH: TSH: 2.41 u[IU]/mL (ref 0.35–4.50)

## 2019-09-30 ENCOUNTER — Ambulatory Visit: Payer: PPO | Admitting: Family Medicine

## 2019-09-30 ENCOUNTER — Telehealth: Payer: Self-pay

## 2019-09-30 NOTE — Telephone Encounter (Signed)
Returned pt's call and she states that she had a missed call while she was in the shower.  Nothing noted in pt's chart about someone calling from our office but I confirmed her appt for this Wednesday, Nov. 18th at 10 am and asked her to arrive at 9:45 am to check-in.  Also did the Covid screening with her and noted it in the scheduling notes.

## 2019-09-30 NOTE — Telephone Encounter (Signed)
Copied from [place] [id number]. Topic: General - Other >> Sep 30, 2019 11:42 AM Keene Breath wrote: Reason for CRM: Patient is returning a call to the Sports Medicine doctor.  Please call patient back at [phone]

## 2019-10-02 ENCOUNTER — Ambulatory Visit: Payer: PPO | Admitting: Family Medicine

## 2019-10-02 ENCOUNTER — Other Ambulatory Visit: Payer: Self-pay

## 2019-10-02 ENCOUNTER — Encounter: Payer: Self-pay | Admitting: Family Medicine

## 2019-10-02 ENCOUNTER — Ambulatory Visit (INDEPENDENT_AMBULATORY_CARE_PROVIDER_SITE_OTHER): Payer: PPO

## 2019-10-02 ENCOUNTER — Ambulatory Visit: Payer: Self-pay

## 2019-10-02 VITALS — BP 130/82 | HR 76 | Ht 65.0 in | Wt 179.4 lb

## 2019-10-02 DIAGNOSIS — M25552 Pain in left hip: Secondary | ICD-10-CM

## 2019-10-02 NOTE — Progress Notes (Signed)
Subjective:    I'm seeing this patient as a consultation for:  Dr. Regis Bill  CC: L hip/groin pain  I, Wendy Poet, LAT, ATC, am serving as scribe for Dr. Lynne Leader.  HPI: Pt is a 68 y/o female presenting w/ c/o L hip/groin pain x 5-6 weeks.  Pain began after she slipped on a plastic bag, causing her to do a split.  Pt reports experiencing L groin pain and "clicking" along her L medial thigh since her injury.  Pain is located predominantly at the medial thigh near the insertion of the hip adductor's onto the pelvis.  She states that her L groin pain is not improving and reports having to walk w/ a shortened stride.  She reports hearing/feeling a click in her groin / medial thigh when she walks.  Aggravating factors include walking and transitioning from sit-stand.  Pt denies any radiating L LE pain or back pain but does report some intermittent numbness/tingling in her L LE.  She has tried massage.  She has not used the Voltaren gel.  Past medical history, Surgical history, Family history not pertinant except as noted below, Social history, Allergies, and medications have been entered into the medical record, reviewed, and no changes needed.   Review of Systems: No headache, visual changes, nausea, vomiting, diarrhea, constipation, dizziness, abdominal pain, skin rash, fevers, chills, night sweats, weight loss, swollen lymph nodes, body aches, joint swelling, muscle aches, chest pain, shortness of breath, mood changes, visual or auditory hallucinations.   Objective:    Vitals:   10/02/19 0947  BP: 130/82  Pulse: 76  SpO2: 96%   General: Well Developed, well nourished, and in no acute distress.  Neuro/Psych: Alert and oriented x3, extra-ocular muscles intact, able to move all 4 extremities, sensation grossly intact. Skin: Warm and dry, no rashes noted.  Respiratory: Not using accessory muscles, speaking in full sentences, trachea midline.  Cardiovascular: Pulses palpable, no extremity  edema. Abdomen: Does not appear distended. MSK:  Left hip: Normal-appearing Normal motion.  Not able to reproduce click with passive range of motion of hip. Mildly tender palpation at hip adductor insertion onto pelvis. Hips strength is intact.  Right hip normal-appearing Normal motion. Nontender. Normal strength.  Normal gait.  Lab and Radiology Results  Limited musculoskeletal ultrasound left hip reveals mild hip effusion with acetabular spur/calcification at superior anterior labrum. Hip adductor tendon insertion onto ramus slight disruption insertion hypoechoic change.  No Doppler change. Impression: Mild hip effusion possible labrum injury.  Hip adductor strain.  X-ray images left hip obtained today personally independently reviewed Mild degenerative changes left hip.  No avulsion fracture or acute fracture visible. Await formal radiology review  Impression and Recommendations:    Assessment and Plan: 68 y.o. female with left hip pain and clicking sensation after a near fall.  Based on ultrasound results likely secondary to hip adductor strain..  Differential includes labral tear or even internal snapping hip syndrome.  Plan to treat with home exercise program and continued over-the-counter medications for pain as needed.  If not improving patient will let me know and next step would likely be referral to physical therapy.  Ultimately would consider intra-articular hip injection or even MRI arthrogram if needed.  PDMP not reviewed this encounter. Orders Placed This Encounter  Procedures  . NO CHG - [place] LOWER LEFT    Order Specific Question:   Reason for Exam (SYMPTOM  OR DIAGNOSIS REQUIRED)    Answer:   L hip pain  Order Specific Question:   Preferred imaging location?    Answer:   [facility]  . DG Hip Unilat W OR W/O Pelvis 2-3 Views Left    Standing Status:   Future    Number of Occurrences:   1    Standing Expiration Date:   12/01/2020    Order Specific  Question:   Reason for Exam (SYMPTOM  OR DIAGNOSIS REQUIRED)    Answer:   eval groin pain    Order Specific Question:   Preferred imaging location?    Answer:   [facility]    Order Specific Question:   Radiology Contrast Protocol - do NOT remove file path    Answer:   [email or web address]   No orders of the defined types were placed in this encounter.   Discussed warning signs or symptoms. Please see discharge instructions. Patient expresses understanding.  The above documentation has been reviewed and is accurate and complete Lynne Leader

## 2019-10-02 NOTE — Patient Instructions (Addendum)
Thank you for coming in today. I think this is more strain of the hip muscles. Work on exercises we discussed. If not better let me know.  Next step will be physical therapy.  Additionally could even consider injection.  Please perform the exercise program that we have prepared for you and gone over in detail on a daily basis.  In addition to the handout you were provided you can access your program through: [email or web address]   Your unique program code is:  [phone]

## 2019-10-03 NOTE — Progress Notes (Signed)
X-ray hip shows no fracture.  No severe arthritis.

## 2019-10-08 ENCOUNTER — Other Ambulatory Visit: Payer: Self-pay

## 2019-10-29 DIAGNOSIS — H2513 Age-related nuclear cataract, bilateral: Secondary | ICD-10-CM | POA: Diagnosis not present

## 2019-10-29 DIAGNOSIS — H40033 Anatomical narrow angle, bilateral: Secondary | ICD-10-CM | POA: Diagnosis not present

## 2019-10-29 DIAGNOSIS — H40023 Open angle with borderline findings, high risk, bilateral: Secondary | ICD-10-CM | POA: Diagnosis not present

## 2019-10-29 DIAGNOSIS — H25013 Cortical age-related cataract, bilateral: Secondary | ICD-10-CM | POA: Diagnosis not present

## 2019-11-20 ENCOUNTER — Other Ambulatory Visit: Payer: Self-pay

## 2019-11-20 ENCOUNTER — Ambulatory Visit: Payer: PPO | Admitting: Podiatry

## 2019-11-20 ENCOUNTER — Encounter: Payer: Self-pay | Admitting: Podiatry

## 2019-11-20 DIAGNOSIS — M79676 Pain in unspecified toe(s): Secondary | ICD-10-CM

## 2019-11-20 DIAGNOSIS — B351 Tinea unguium: Secondary | ICD-10-CM

## 2019-11-20 NOTE — Patient Instructions (Signed)

## 2019-11-22 ENCOUNTER — Other Ambulatory Visit: Payer: Self-pay

## 2019-11-24 NOTE — Progress Notes (Signed)
Subjective:  Jasara Darbyshire presents to clinic today with cc of  painful, thick, discolored, elongated toenails  of both feet that become tender and patient cannot cut because of thickness. Pain is aggravated when wearing enclosed shoe gear and relieved with periodic professional debridement.  Patient voices no new pedal concerns on today's visit.  Medications reviewed in chart.  Allergies  Allergen Reactions  . Bee Venom Hives  . Prednisone Other (See Comments)    hallucinations   Objective:  Physical Examination:  Vascular Examination: Capillary refill time immediate b/l.  Palpable DP/PT pulses b/l.  Digital hair present b/l.   No edema noted b/l.  Skin temperature gradient WNL b/l.  Dermatological Examination: Skin with normal turgor, texture and tone b/l.  No open wounds b/l.  No interdigital macerations noted b/l.  Elongated, thick, discolored brittle toenails with subungual debris and pain on dorsal palpation of nailbeds 1-5 b/l.  Well healed surgical scar dorsal left 5th metatarsal. No edema, no erythema, no flocculence.   Musculoskeletal Examination: Muscle strength 5/5 to all muscle groups b/l.  No gross bony deformities b/l.   No pain, crepitus or joint discomfort with active/passive ROM.  Neurological Examination: Sensation intact 5/5 b/l with 10 gram monofilament.  Vibratory sensation intact b/l.  Proprioceptive sensation intact b/l.  Assessment: Mycotic nail infection with pain 1-5 b/l  Plan: 1. Toenails 1-5 b/l were debrided in length and girth without iatrogenic laceration. 2.  Continue soft, supportive shoe gear daily. 3.  Report any pedal injuries to medical professional. 4.  Follow up 3 months. 5.  Patient/POA to call should there be a question/concern in there interim.

## 2019-11-25 ENCOUNTER — Encounter: Payer: Self-pay | Admitting: Women's Health

## 2019-11-25 ENCOUNTER — Ambulatory Visit (INDEPENDENT_AMBULATORY_CARE_PROVIDER_SITE_OTHER): Payer: PPO | Admitting: Women's Health

## 2019-11-25 ENCOUNTER — Other Ambulatory Visit: Payer: Self-pay

## 2019-11-25 VITALS — BP 124/70

## 2019-11-25 DIAGNOSIS — N760 Acute vaginitis: Secondary | ICD-10-CM | POA: Diagnosis not present

## 2019-11-25 DIAGNOSIS — R300 Dysuria: Secondary | ICD-10-CM

## 2019-11-25 DIAGNOSIS — B9689 Other specified bacterial agents as the cause of diseases classified elsewhere: Secondary | ICD-10-CM | POA: Diagnosis not present

## 2019-11-25 DIAGNOSIS — N898 Other specified noninflammatory disorders of vagina: Secondary | ICD-10-CM

## 2019-11-25 LAB — WET PREP FOR TRICH, YEAST, CLUE

## 2019-11-25 MED ORDER — METRONIDAZOLE 500 MG PO TABS: 500.0000 mg | ORAL_TABLET | Freq: Two times a day (BID) | ORAL | 0 refills | Status: DC

## 2019-11-25 NOTE — Progress Notes (Signed)
69 yo SBF G3P2 presents with lower abdominal pressure and itching at mons pubis x1 week. Denies vaginal discharge, odor, pain or burning with urination, increased urinary frequency, or fever. 1996 TAH for fibroids on no HRT. No known medical problems, smokes less than 5 cigarettes per day. Has seasonal allergies and mild arthritis.  Exam: appears well. No CVAT, abdomen soft. External genitalia within normal limits, no erythema, rash or skin irritation at mons pubis. Speculum exam scant discharge, no erythema. Bimanual no tenderness.  Wet prep positive for clue cells and TNTC bacteria UA: Negative leukocytes, 0-5 WBCs, no RBCs, few bacteria.   Bacterial Vaginosis  Smoker   Plan: metronidazole 500 mg BID x 7 days, alcohol precautions reviewed. Encouraged to decrease/quit smoking, tips for quitting discussed. Urine culture pending.

## 2019-11-25 NOTE — Patient Instructions (Signed)
Bacterial Vaginosis  Bacterial vaginosis is a vaginal infection that occurs when the normal balance of bacteria in the vagina is disrupted. It results from an overgrowth of certain bacteria. This is the most common vaginal infection among women ages 15-44. Because bacterial vaginosis increases your risk for STIs (sexually transmitted infections), getting treated can help reduce your risk for chlamydia, gonorrhea, herpes, and HIV (human immunodeficiency virus). Treatment is also important for preventing complications in pregnant women, because this condition can cause an early (premature) delivery. What are the causes? This condition is caused by an increase in harmful bacteria that are normally present in small amounts in the vagina. However, the reason that the condition develops is not fully understood. What increases the risk? The following factors may make you more likely to develop this condition:  Having a new sexual partner or multiple sexual partners.  Having unprotected sex.  Douching.  Having an intrauterine device (IUD).  Smoking.  Drug and alcohol abuse.  Taking certain antibiotic medicines.  Being pregnant. You cannot get bacterial vaginosis from toilet seats, bedding, swimming pools, or contact with objects around you. What are the signs or symptoms? Symptoms of this condition include:  Grey or white vaginal discharge. The discharge can also be watery or foamy.  A fish-like odor with discharge, especially after sexual intercourse or during menstruation.  Itching in and around the vagina.  Burning or pain with urination. Some women with bacterial vaginosis have no signs or symptoms. How is this diagnosed? This condition is diagnosed based on:  Your medical history.  A physical exam of the vagina.  Testing a sample of vaginal fluid under a microscope to look for a large amount of bad bacteria or abnormal cells. Your health care provider may use a cotton swab or  a small wooden spatula to collect the sample. How is this treated? This condition is treated with antibiotics. These may be given as a pill, a vaginal cream, or a medicine that is put into the vagina (suppository). If the condition comes back after treatment, a second round of antibiotics may be needed. Follow these instructions at home: Medicines  Take over-the-counter and prescription medicines only as told by your health care provider.  Take or use your antibiotic as told by your health care provider. Do not stop taking or using the antibiotic even if you start to feel better. General instructions  If you have a female sexual partner, tell her that you have a vaginal infection. She should see her health care provider and be treated if she has symptoms. If you have a female sexual partner, he does not need treatment.  During treatment: ? Avoid sexual activity until you finish treatment. ? Do not douche. ? Avoid alcohol as directed by your health care provider. ? Avoid breastfeeding as directed by your health care provider.  Drink enough water and fluids to keep your urine clear or pale yellow.  Keep the area around your vagina and rectum clean. ? Wash the area daily with warm water. ? Wipe yourself from front to back after using the toilet.  Keep all follow-up visits as told by your health care provider. This is important. How is this prevented?  Do not douche.  Wash the outside of your vagina with warm water only.  Use protection when having sex. This includes latex condoms and dental dams.  Limit how many sexual partners you have. To help prevent bacterial vaginosis, it is best to have sex with just one partner (  monogamous).  Make sure you and your sexual partner are tested for STIs.  Wear cotton or cotton-lined underwear.  Avoid wearing tight pants and pantyhose, especially during summer.  Limit the amount of alcohol that you drink.  Do not use any products that contain  nicotine or tobacco, such as cigarettes and e-cigarettes. If you need help quitting, ask your health care provider.  Do not use illegal drugs. Where to find more information  Centers for Disease Control and Prevention: www.cdc.gov/std  American Sexual Health Association (ASHA): www.ashastd.org  U.S. Department of Health and Human Services, Office on Women's Health: www.womenshealth.gov/ or https://www.womenshealth.gov/a-z-topics/bacterial-vaginosis Contact a health care provider if:  Your symptoms do not improve, even after treatment.  You have more discharge or pain when urinating.  You have a fever.  You have pain in your abdomen.  You have pain during sex.  You have vaginal bleeding between periods. Summary  Bacterial vaginosis is a vaginal infection that occurs when the normal balance of bacteria in the vagina is disrupted.  Because bacterial vaginosis increases your risk for STIs (sexually transmitted infections), getting treated can help reduce your risk for chlamydia, gonorrhea, herpes, and HIV (human immunodeficiency virus). Treatment is also important for preventing complications in pregnant women, because the condition can cause an early (premature) delivery.  This condition is treated with antibiotic medicines. These may be given as a pill, a vaginal cream, or a medicine that is put into the vagina (suppository). This information is not intended to replace advice given to you by your health care provider. Make sure you discuss any questions you have with your health care provider. Document Revised: 10/13/2017 Document Reviewed: 07/16/2016 Elsevier Patient Education  2020 Elsevier Inc.  

## 2019-11-25 NOTE — Addendum Note (Signed)
Addended by: Joaquin Music on: 11/25/2019 04:18 PM   Modules accepted: Orders

## 2019-11-27 LAB — URINALYSIS, COMPLETE W/RFL CULTURE
Bilirubin Urine: NEGATIVE
Glucose, UA: NEGATIVE
Hyaline Cast: NONE SEEN /LPF
Ketones, ur: NEGATIVE
Leukocyte Esterase: NEGATIVE
Nitrites, Initial: NEGATIVE
Protein, ur: NEGATIVE
RBC / HPF: NONE SEEN /HPF (ref 0–2)
Specific Gravity, Urine: 1.02 (ref 1.001–1.03)
pH: 5.5 (ref 5.0–8.0)

## 2019-11-27 LAB — URINE CULTURE
MICRO NUMBER:: 10027673
SPECIMEN QUALITY:: ADEQUATE

## 2019-11-27 LAB — CULTURE INDICATED

## 2019-12-05 ENCOUNTER — Telehealth: Payer: Self-pay | Admitting: *Deleted

## 2019-12-05 NOTE — Telephone Encounter (Signed)
Patient was seen on 11/25/19 treated for BV with Flagyl 500 mg tablet BID x 7 days, called today c/o ammonia smell vaginally. No itching, no discharge, no urinary symptoms, she did not drink any alcohol while taking medication.  Patient would like to know your thoughts? Please advise

## 2019-12-05 NOTE — Telephone Encounter (Signed)
Pl call and encourage to drink more water, push fluids and watch at this time  call if persists may need OV

## 2019-12-06 NOTE — Telephone Encounter (Signed)
Patient informed. 

## 2019-12-10 ENCOUNTER — Other Ambulatory Visit: Payer: Self-pay | Admitting: Internal Medicine

## 2019-12-10 DIAGNOSIS — Z1231 Encounter for screening mammogram for malignant neoplasm of breast: Secondary | ICD-10-CM

## 2020-01-15 ENCOUNTER — Ambulatory Visit
Admission: RE | Admit: 2020-01-15 | Discharge: 2020-01-15 | Disposition: A | Discharge status: Home / Self Care | Payer: PPO | Source: Ambulatory Visit | Attending: Internal Medicine | Admitting: Internal Medicine

## 2020-01-15 ENCOUNTER — Other Ambulatory Visit: Payer: Self-pay

## 2020-01-15 DIAGNOSIS — Z1231 Encounter for screening mammogram for malignant neoplasm of breast: Secondary | ICD-10-CM

## 2020-01-16 IMAGING — DX DG HIP (WITH OR WITHOUT PELVIS) 2-3V*L*
3 series · 3 of 3 positions shown · non-contrast
Comparison: None.

CLINICAL DATA: Left hip pain.

EXAM:
DG HIP (WITH OR WITHOUT PELVIS) 2-3V LEFT

[pelvis ap]
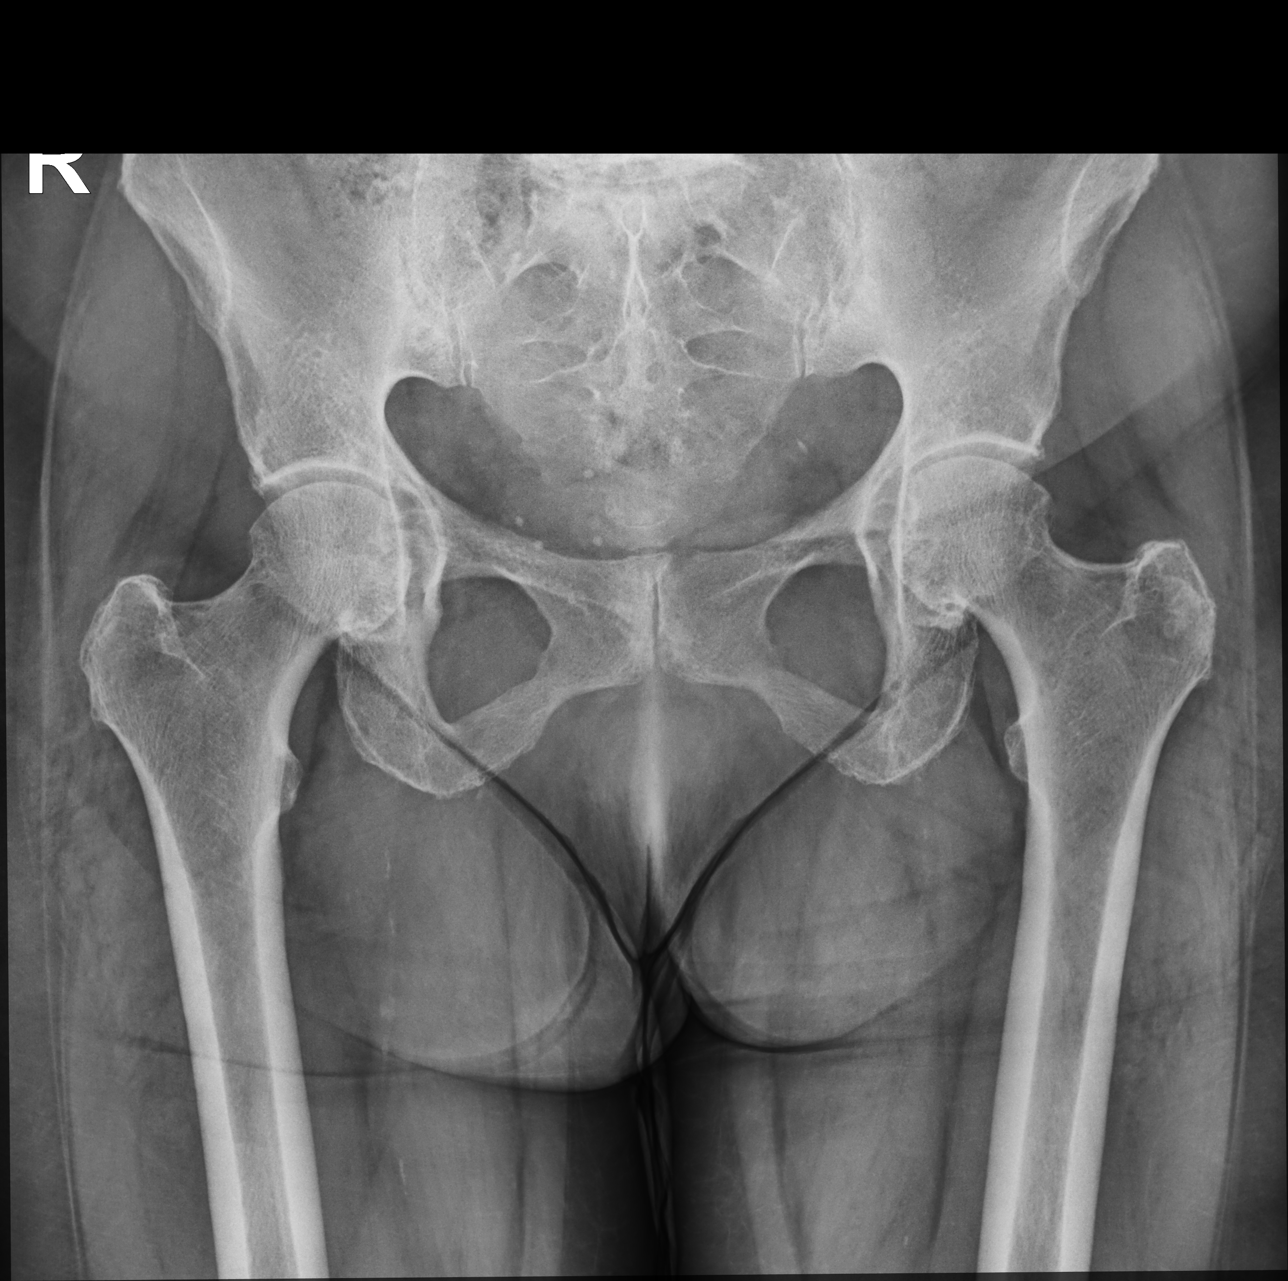

[hip joint ap]
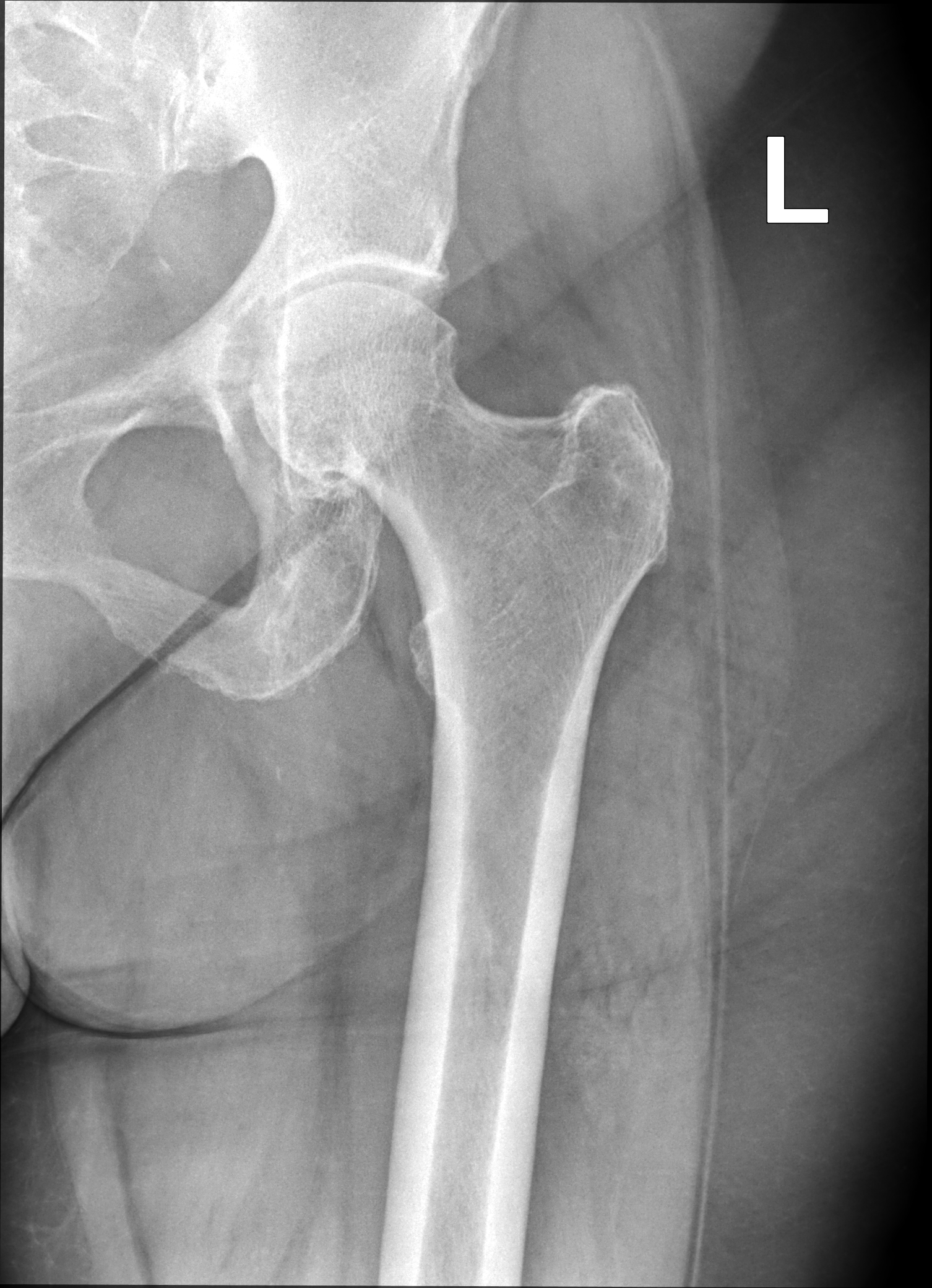

[hip joint (frog view)]
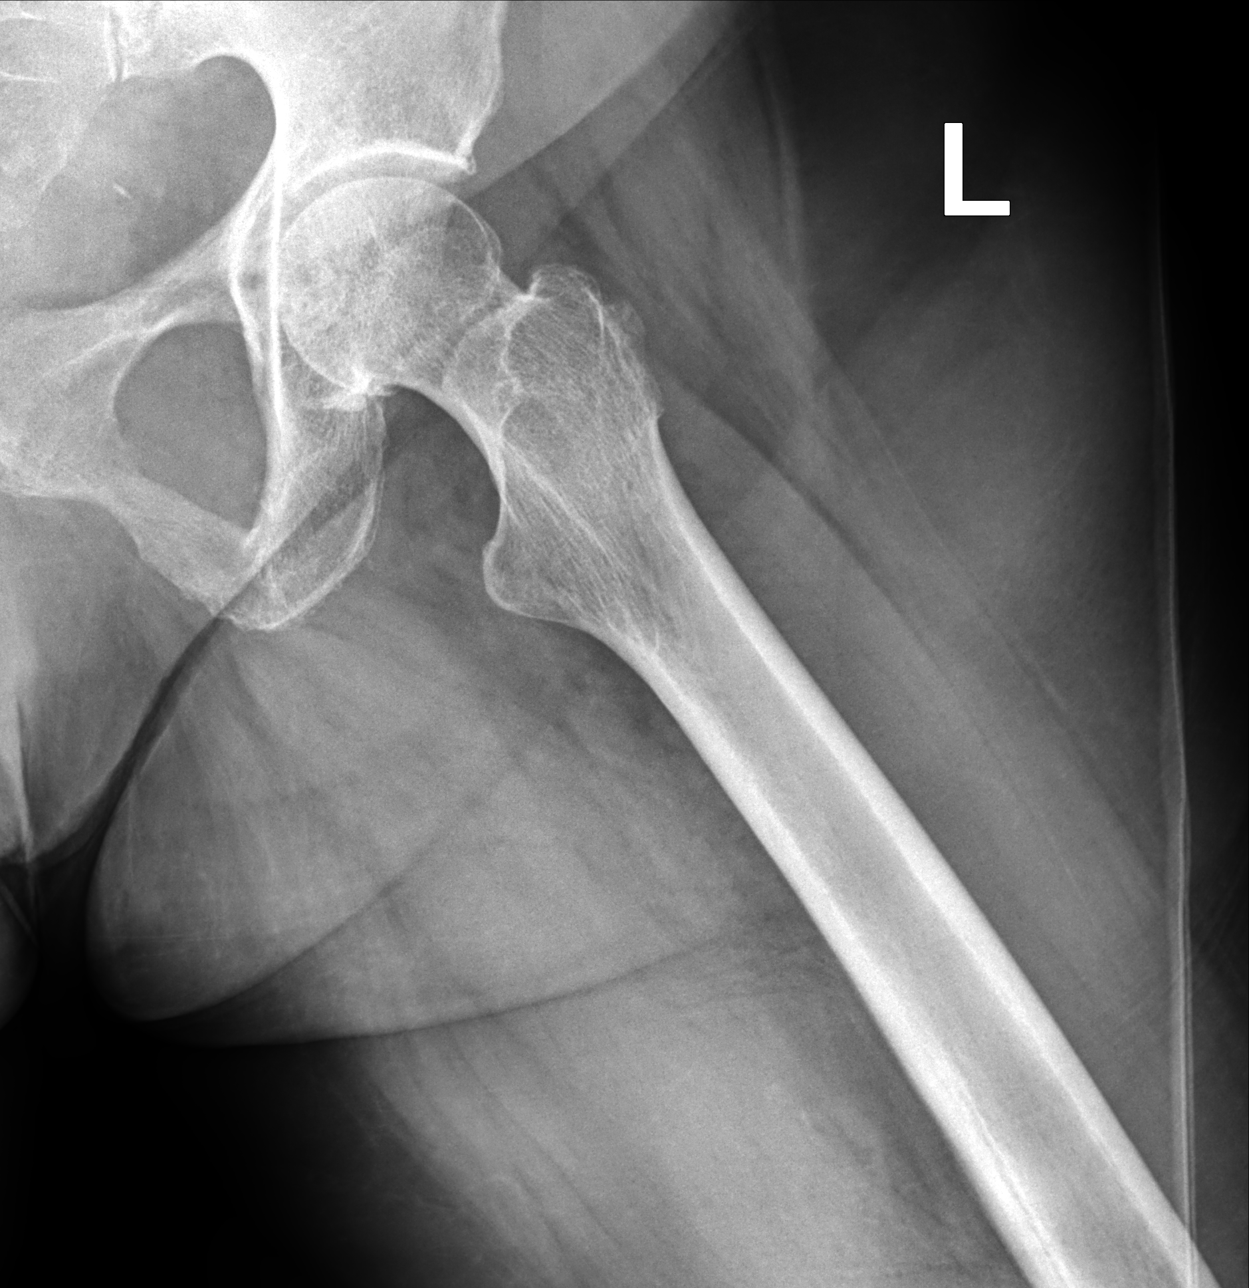

[3 of 3 positions shown; findings below may reference images not displayed]

FINDINGS: There is no evidence of hip fracture or dislocation. There is no
evidence of arthropathy or other focal bone abnormality.
IMPRESSION: Negative.

## 2020-01-31 ENCOUNTER — Encounter (INDEPENDENT_AMBULATORY_CARE_PROVIDER_SITE_OTHER): Payer: Self-pay | Admitting: Otolaryngology

## 2020-01-31 ENCOUNTER — Telehealth: Payer: Self-pay | Admitting: Internal Medicine

## 2020-01-31 ENCOUNTER — Ambulatory Visit (INDEPENDENT_AMBULATORY_CARE_PROVIDER_SITE_OTHER): Payer: PPO | Admitting: Otolaryngology

## 2020-01-31 ENCOUNTER — Other Ambulatory Visit: Payer: Self-pay

## 2020-01-31 VITALS — Temp 97.3°F

## 2020-01-31 DIAGNOSIS — H6123 Impacted cerumen, bilateral: Secondary | ICD-10-CM

## 2020-01-31 NOTE — Chronic Care Management (AMB) (Signed)
  Chronic Care Management   Note  01/31/2020 Name: Jeanea Barca MRN: [id number] DOB: June 03, 1951  Felix Adams is a 69 y.o. year old female who is a primary care patient of Panosh, [name], MD. I reached out to [employer] by phone today in response to a referral sent by Ms. Maguadalupe Rudell's PCP, Panosh, [name], MD.   Ms. Mountford was given information about Chronic Care Management services today including:  1. CCM service includes personalized support from designated clinical staff supervised by her physician, including individualized plan of care and coordination with other care providers 2. 24/7 contact phone numbers for assistance for urgent and routine care needs. 3. Service will only be billed when office clinical staff spend 20 minutes or more in a month to coordinate care. 4. Only one practitioner may furnish and bill the service in a calendar month. 5. The patient may stop CCM services at any time (effective at the end of the month) by phone call to the office staff.   Patient agreed to services and verbal consent obtained.   Follow up plan:   Raynicia Dukes [employer] Scheduler

## 2020-01-31 NOTE — Progress Notes (Signed)
HPI: Debra Woodward is a 69 y.o. female who presents for evaluation of wax buildup in her ears left side worse than right..  Past Medical History:  Diagnosis Date  . ALLERGIC RHINITIS 09/14/2006  . Allergy   . Fibroid   . Hx of adenomatous colonic polyps 07/28/2015  . Seasonal allergies    Past Surgical History:  Procedure Laterality Date  . ABDOMINAL HYSTERECTOMY  1996  . BREAST EXCISIONAL BIOPSY Left 1983   No scar seen  . BREAST EXCISIONAL BIOPSY Right 1987   NO scar seen  . BREAST SURGERY Bilateral 1983, 1996   benign  . CARPAL TUNNEL RELEASE Right 2008  . COLONOSCOPY  07/23/2015  . CYSTECTOMY Bilateral 1983, 1988   breast; benign  . KNEE ARTHROSCOPY Right 2010  . TOE SURGERY Bilateral   . TOTAL KNEE ARTHROPLASTY Right 2011   Social History   Socioeconomic History  . Marital status: Single    Spouse name: Not on file  . Number of children: Not on file  . Years of education: Not on file  . Highest education level: Not on file  Occupational History  . Not on file  Tobacco Use  . Smoking status: Current Every Day Smoker    Packs/day: 0.25    Years: 51.00    Pack years: 12.75    Types: Cigarettes    Start date: 42  . Smokeless tobacco: Never Used  . Tobacco comment: 4 cigs a day (09/28/17)  Substance and Sexual Activity  . Alcohol use: Yes    Alcohol/week: 2.0 standard drinks    Types: 2 Glasses of wine per week    Comment: social  . Drug use: No  . Sexual activity: Yes    Birth control/protection: Surgical    Comment: HYST  Other Topics Concern  . 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    Social Determinants of Health   Financial Resource Strain:   . Difficulty of Paying Living Expenses:   Food Insecurity:   . Worried About Charity fundraiser in the Last Year:   . Arboriculturist in the Last Year:   Transportation Needs:   . Film/video editor  (Medical):   Marland Kitchen Lack of Transportation (Non-Medical):   Physical Activity:   . Days of Exercise per Week:   . Minutes of Exercise per Session:   Stress:   . Feeling of Stress :   Social Connections:   . Frequency of Communication with Friends and Family:   . Frequency of Social Gatherings with Friends and Family:   . Attends Religious Services:   . Active Member of Clubs or Organizations:   . Attends Archivist Meetings:   Marland Kitchen Marital Status:    Family History  Problem Relation Age of Onset  . Stroke Mother   . Diabetes Mother   . Diabetes Father   . Hypertension Sister   . Esophageal cancer Paternal Grandfather 56  . Colon cancer Neg Hx   . Stomach cancer Neg Hx   . Rectal cancer Neg Hx   . Breast cancer Neg Hx   . Colon polyps Neg Hx    Allergies  Allergen Reactions  . Bee Venom Hives  . Prednisone Other (See Comments)    hallucinations   Prior to Admission medications  Medication Sig Start Date End Date Taking? Authorizing Provider  Cholecalciferol (VITAMIN D3) 2000 UNITS capsule Take 2,000 Units by mouth daily.   Yes [provider]  diclofenac sodium (VOLTAREN) 1 % GEL Apply 2 g topically 4 (four) times daily.  01/16/19  Yes [provider]  docusate sodium (COLACE) 100 MG capsule Take 1 capsule (100 mg total) by mouth every 12 (twelve) hours. 06/26/19  Yes Khatri, Hina, PA-C  fexofenadine (ALLEGRA) 60 MG tablet Take 60 mg by mouth daily.   Yes [provider]  fluticasone (FLONASE) 50 MCG/ACT nasal spray SMARTSIG:2 Spray(s) Both Nares Every Night 11/04/19  Yes [provider]  FLUZONE HIGH-DOSE QUADRIVALENT 0.7 ML SUSY  09/02/19  Yes [provider]  metroNIDAZOLE (FLAGYL) 500 MG tablet Take 1 tablet (500 mg total) by mouth 2 (two) times daily. 11/25/19  Yes Huel Cote, NP  Multiple Vitamin (MULTIVITAMIN) tablet Take 1 tablet by mouth daily.     Yes [provider]  NONFORMULARY OR COMPOUNDED ITEM Apply  1-2 g topically daily. [facility]: Nail laquer 02/12/19  Yes Galaway, Jennifer L, DPM  polyethylene glycol (MIRALAX / GLYCOLAX) 17 g packet Take 17 g by mouth daily. 06/26/19  Yes Khatri, Hina, PA-C  Propylene Glycol (SYSTANE COMPLETE OP) Apply to eye.   Yes [provider]     Positive ROS: Otherwise negative  All other systems have been reviewed and were otherwise negative with the exception of those mentioned in the HPI and as above.  Physical Exam: Constitutional: Alert, well-appearing, no acute distress Ears: External ears without lesions or tenderness. Ear canals with moderate wax buildup in both sides left side worse than right.  This was cleaned in the office with curettes.  TMs were otherwise clear bilaterally.. Nasal: External nose without lesions. Clear nasal passages Oral: Oropharynx clear. Neck: No palpable adenopathy or masses Respiratory: Breathing comfortably  Skin: No facial/neck lesions or rash noted.  Cerumen impaction removal  Date/Time: 01/31/2020 5:48 PM Performed by: Rozetta Nunnery, MD Authorized by: Rozetta Nunnery, MD   Consent:    Consent obtained:  Verbal   Consent given by:  Patient   Risks discussed:  Pain and bleeding Procedure details:    Location:  L ear and R ear   Procedure type: curette and forceps   Post-procedure details:    Inspection:  TM intact and canal normal   Hearing quality:  Improved   Patient tolerance of procedure:  Tolerated well, no immediate complications Comments:     TMs are clear bilaterally.    Assessment: Cerumen buildup  Plan: This was cleaned in the office. She will follow-up as needed  Radene Journey, MD

## 2020-02-18 ENCOUNTER — Other Ambulatory Visit: Payer: Self-pay

## 2020-02-18 DIAGNOSIS — Z79899 Other long term (current) drug therapy: Secondary | ICD-10-CM

## 2020-02-18 DIAGNOSIS — R739 Hyperglycemia, unspecified: Secondary | ICD-10-CM

## 2020-02-19 ENCOUNTER — Encounter: Payer: Self-pay | Admitting: Podiatry

## 2020-02-19 ENCOUNTER — Other Ambulatory Visit: Payer: Self-pay

## 2020-02-19 ENCOUNTER — Ambulatory Visit: Payer: PPO | Admitting: Podiatry

## 2020-02-19 DIAGNOSIS — B351 Tinea unguium: Secondary | ICD-10-CM | POA: Diagnosis not present

## 2020-02-19 DIAGNOSIS — M79676 Pain in unspecified toe(s): Secondary | ICD-10-CM | POA: Diagnosis not present

## 2020-02-19 NOTE — Patient Instructions (Signed)

## 2020-02-20 ENCOUNTER — Encounter (INDEPENDENT_AMBULATORY_CARE_PROVIDER_SITE_OTHER): Payer: Self-pay

## 2020-02-20 ENCOUNTER — Ambulatory Visit: Payer: PPO

## 2020-02-20 DIAGNOSIS — J309 Allergic rhinitis, unspecified: Secondary | ICD-10-CM

## 2020-02-20 DIAGNOSIS — M170 Bilateral primary osteoarthritis of knee: Secondary | ICD-10-CM

## 2020-02-20 DIAGNOSIS — F17200 Nicotine dependence, unspecified, uncomplicated: Secondary | ICD-10-CM

## 2020-02-20 DIAGNOSIS — R739 Hyperglycemia, unspecified: Secondary | ICD-10-CM

## 2020-02-20 NOTE — Chronic Care Management (AMB) (Signed)
Chronic Care Management Pharmacy  Name: Debra Woodward  MRN: [id number] DOB: January 20, 1951  Initial Questions: 1. Have you seen any other providers since your last visit? NA 2. Any changes in your medicines or health? No   Chief Complaint/ HPI  Debra Woodward,  69 y.o. , female presents for their Initial CCM visit with the clinical pharmacist via telephone due to COVID-19 Pandemic.  PCP : Burnis Medin, MD  Their chronic conditions include: Tobacco use, Hyperglycemia, Allergic rhinitis, Arthritis   Office Visits: 09/23/2019- Patient presented for office visit with Dr. Shanon Ace, MD for annual wellness exam. Patient reported left groin pain after slipping. Patient referred to sports medicine. Labs ordered: CBC, A1c, lipid pnael, TSH,   Consult Visit: 02/19/2020- Podiatry- [name], [name]   01/31/2020- Otolaryngology- [name], [name]   11/25/2019- Obstetrics and Gynecology- Patient presented for office visit with Elon Alas, NP for lower abdominal pressure and itching. Patient prescribed metronidazole 500mg  twice daily for 7 days for bacterial vaginosis. Pending urine culture.   11/20/2019- Podiatry- [name], DPM for painful, thick, discolored, elongated toenails. Patient cannot cut due to thickness. Toenails were debrided in length and girth. Patient to wear soft, supportive shoe gear. Follow up in 3 months.   10/02/2019- Family Medicine- Patient presented for office visit with Dr. Lynne Leader for left hip pain. BP: 130/80. Left hip pain and clicking sensation likely secondary to hip adductor strain. Plan: home exercise program and OTC pain medications as needed. Patient to follow up if no improvement and referral to physical therapy.    Medications: Outpatient Encounter Medications as of 02/20/2020  Medication Sig  . Ascorbic Acid (VITAMIN C) 500 MG CAPS Take 1 capsule by mouth daily.  . cetirizine (ZYRTEC) 10 MG tablet Take 10 mg by mouth daily. Patient alternates with Allegra  . Cholecalciferol (VITAMIN D3) 2000 UNITS capsule Take 2,000 Units by mouth daily.  . diclofenac sodium (VOLTAREN) 1 % GEL Apply 2 g topically 4 (four) times daily.   Marland Kitchen docusate sodium (COLACE) 100 MG capsule Take 1 capsule (100 mg total) by mouth every 12 (twelve) hours.  . fexofenadine (ALLEGRA) 60 MG tablet Take 60 mg by mouth daily. Patient alternates with Zyrtec  . fluticasone (FLONASE) 50 MCG/ACT nasal spray SMARTSIG:2 Spray(s) Both Nares Every Night  . Multiple Vitamin (MULTIVITAMIN) tablet Take 1 tablet by mouth daily.    . NONFORMULARY OR COMPOUNDED ITEM Apply 1-2 g topically daily. [facility]: Nail laquer  . polyethylene glycol (MIRALAX / GLYCOLAX) 17 g packet Take 17 g by mouth daily.  Marland Kitchen Propylene Glycol (SYSTANE COMPLETE OP) Apply to eye.  Marland Kitchen FLUZONE HIGH-DOSE QUADRIVALENT 0.7 ML SUSY   . [DISCONTINUED] metroNIDAZOLE (FLAGYL) 500 MG tablet Take 1 tablet (500 mg total) by mouth 2 (two) times daily.   No facility-administered encounter medications on file as of 02/20/2020.    Current Diagnosis/Assessment:  Goals Addressed            This Visit's Progress   . Pharmacy Care Plan       CARE PLAN ENTRY  Current Barriers:  . Chronic Disease Management support, education, and care coordination needs related to  Smoking cessation, Elevated blood sugar, Allergic Rhinitis, Arthritis  Pharmacist Clinical Goal(s):  Marland Kitchen Over next 6 months work on the following goals,  o Smoking cessation - Decrease number of cigarettes o Elevated blood sugars - Work on lifestyle modifications (diet and exercise). o Arthritis . Continue to see improvement in pain level.   Interventions: . Comprehensive medication review performed. . Smoking  cessation o Continue chewing sugarless gum or eating baby carrots to minimize number of cigarettes.  . Elevated blood glucose . Keeping blood pressure and lipids under goal. We discussed modifying lifestyle, including to participate in moderate physical activity (e.g., walking) at least 150 minutes per week.  . Discussed a Mediterranean eating plan with an emphasis on whole grains, legumes, nuts, fruits, and vegetables and minimal refined and processed foods. . Allergic rhinitis . Continue:  - Alternating between Allergra 60mg , 1 tablet once daily and Zyrtec 10mg , 1 tablet once daily.  - Fluticasone (Flonase) 47mcg/act nasal spray, use 2 sprays into both nostrils every night . Arthritis . Continue:  -  diclofenac 1% gel, apply 2 grams four times daily as needed  Patient Self Care Activities:  . Self administers medications as prescribed, Calls pharmacy for medication refills, and Calls provider office for new concerns or questions  Initial goal documentation        SDOH Interventions     Most Recent Value  SDOH Interventions  SDOH Interventions for the Following Domains  Tobacco  Tobacco Interventions  Other (Comment) [Discussed smoking cessation. Plans to readdress at CCM follow up.]       Tobacco Use    Tobacco Status:  Social History   Tobacco Use  Smoking Status Current Every Day Smoker  . Packs/day: 0.25  . Years: 51.00  . Pack years: 12.75  . Types: Cigarettes  . Start date: 1973  Smokeless Tobacco Never Used  Tobacco Comment   4 cigs a day (09/28/17)   Patient endorses 1 pack will last 6 to 7 days. She typically smoked 3 to 4 cigarettes per day In between she chews sugarless gum or eats baby carrots.   Plan Continue working with patient on smoking cessation.  Reassess at next follow up.   Hyperglycemia  Recent Relevant Labs: Lab Results  Component Value Date/Time   HGBA1C 6.4 09/23/2019 04:28 PM   HGBA1C 6.5 09/18/2018 11:45 AM    Patient has  failed these meds in past: none Patient is currently controlled on the following medications:  - lifestyle modifications   We discussed: diet and exercise extensively.  We will focus on managing/monitoring CVD risk factors, including keeping blood pressure and lipids under goal. We discussed modifying lifestyle, including to participate in moderate physical activity (e.g., walking) at least 150 minutes per week. Discussed a Mediterranean eating plan with an emphasis on whole grains, legumes, nuts, fruits, and vegetables and minimal refined and processed foods.   Patient states she does not eat red meat nor  fried foods.  Endorses numerous vegetables (carrots, brocolli, squash, zucchini, spinach, collard greens, beans) and seafood, [place] and avoiding whitte potatoes.   Patient uses stationay bike about three times per week (about 30 to 45 minutes) and walks a mile every day.   Patient states her father and other family members had diabetes and is aware of food options to avoid/ limit.   Plan Continue control with diet and exercise.   Allergic rhinitis   Patient is currently controlled on the following medications:  - fexofenadine (Allegra) 60mg , 1 tablet once daily  - cetirizine (Zyrtec) 10mg , 1 tablet once daily (alternates antihistamines- does not take together)   - fluticasone (Flonase) 66mcg/ act nasal  spray, 2 sprays into both nostrils every night   Plan Continue current medications.   Arthritis of both knees   Patient has failed these meds in past: none Patient is currently controlled on the following medications:  - diclofenac 1% gel, apply 2 grams four times daily (uses as needed)   Plan Continue current medications   Vaccines  Reviewed and discussed patient's vaccination history.    Immunization History  Administered Date(s) Administered  . Hep A / Hep B 01/25/2018, 02/27/2018, 08/10/2018  . Influenza Split 08/07/2012, 08/23/2014  . Influenza Whole 08/14/2001,  09/10/2007, 08/16/2010, 08/15/2011  . Influenza, High Dose Seasonal PF 08/23/2017, 08/10/2018, 09/02/2019  . Influenza,inj,Quad PF,6+ Mos 08/16/2016  . Influenza-Unspecified 08/14/2013, 08/14/2017  . Pneumococcal Conjugate-13 09/30/2016  . Pneumococcal Polysaccharide-23 11/15/2011, 09/18/2018  . Pneumococcal-Unspecified 07/15/2016  . Td 03/13/2009  . Tdap 01/25/2018  . Zoster 05/28/2012  . Zoster Recombinat (Shingrix) 03/22/2017, 12/11/2017   Plan Patient obtained [place] vaccine at [facility]. 1st dose: 12/03/2019 2nd dose: 12/25/19 - Sent CMA message to add COVID into patient chart.    Follow up Follow up visit with PharmD in 6 months.  Will conduct general telephone calls for periodic check-ins before next visit.   Anson Crofts, PharmD Clinical Pharmacist [facility] [place] [phone]

## 2020-02-20 NOTE — Progress Notes (Unsigned)
Pt. Called in today needing an antibiotic for parotid gland swelling. I called Keflex 500 mg BID X 10 days with no extra refills. Pharm. @[facility]  [place].  PM

## 2020-02-20 NOTE — Patient Instructions (Addendum)
Visit Information  Goals Addressed            This Visit's Progress   . Pharmacy Care Plan       CARE PLAN ENTRY  Current Barriers:  . Chronic Disease Management support, education, and care coordination needs related to  Smoking cessation, Elevated blood sugar, Allergic Rhinitis, Arthritis  Pharmacist Clinical Goal(s):  Marland Kitchen Over next 6 months work on the following goals,  o Smoking cessation - Decrease number of cigarettes o Elevated blood sugars - Work on lifestyle modifications (diet and exercise). o Arthritis . Continue to see improvement in pain level.   Interventions: . Comprehensive medication review performed. . Smoking cessation o Continue chewing sugarless gum or eating baby carrots to minimize number of cigarettes.  . Elevated blood glucose . Keeping blood pressure and lipids under goal. We discussed modifying lifestyle, including to participate in moderate physical activity (e.g., walking) at least 150 minutes per week.  . Discussed a Mediterranean eating plan with an emphasis on whole grains, legumes, nuts, fruits, and vegetables and minimal refined and processed foods. . Allergic rhinitis . Continue:  - Alternating between Allergra 60mg , 1 tablet once daily and Zyrtec 10mg , 1 tablet once daily.  - Fluticasone (Flonase) 75mcg/act nasal spray, use 2 sprays into both nostrils every night . Arthritis . Continue:  -  diclofenac 1% gel, apply 2 grams four times daily as needed  Patient Self Care Activities:  . Self administers medications as prescribed, Calls pharmacy for medication refills, and Calls provider office for new concerns or questions  Initial goal documentation        Ms. Shidler was given information about Chronic Care Management services today including:  1. CCM service includes personalized support from designated clinical staff supervised by her physician, including individualized plan of care and coordination with other care providers 2. 24/7  contact phone numbers for assistance for urgent and routine care needs. 3. Standard insurance, coinsurance, copays and deductibles apply for chronic care management only during months in which we provide at least 20 minutes of these services. Most insurances cover these services at 100%, however patients may be responsible for any copay, coinsurance and/or deductible if applicable. This service may help you avoid the need for more expensive face-to-face services. 4. Only one practitioner may furnish and bill the service in a calendar month. 5. The patient may stop CCM services at any time (effective at the end of the month) by phone call to the office staff.  Patient agreed to services and verbal consent obtained.   The patient verbalized understanding of instructions provided today and agreed to receive a mailed copy of patient instruction and/or educational materials. Telephone follow up appointment with pharmacy team member scheduled for: 08/19/2020  Anson Crofts, PharmD Clinical Pharmacist [facility] [place] [phone]   Hyperglycemia Hyperglycemia occurs when the level of sugar (glucose) in the blood is too high. Glucose is a type of sugar that provides the body's main source of energy. Certain hormones (insulin and glucagon) control the level of glucose in the blood. Insulin lowers blood glucose, and glucagon increases blood glucose. Hyperglycemia can result from having too little insulin in the bloodstream, or from the body not responding normally to insulin. Hyperglycemia occurs most often in people who have diabetes (diabetes mellitus), but it can happen in people who do not have diabetes. It can develop quickly, and it can be life-threatening if it causes you to become severely dehydrated (diabetic ketoacidosis or hyperglycemic  hyperosmolar state). Severe hyperglycemia is a medical emergency. What are the causes? If you have diabetes, hyperglycemia may be  caused by:  Diabetes medicine.  Medicines that increase blood glucose or affect your diabetes control.  Not eating enough, or not eating often enough.  Changes in physical activity level.  Being sick or having an infection. If you have prediabetes or undiagnosed diabetes:  Hyperglycemia may be caused by those conditions. If you do not have diabetes, hyperglycemia may be caused by:  Certain medicines, including steroid medicines, beta-blockers, epinephrine, and thiazide diuretics.  Stress.  Serious illness.  Surgery.  Diseases of the pancreas.  Infection. What increases the risk? Hyperglycemia is more likely to develop in people who have risk factors for diabetes, such as:  Having a family member with diabetes.  Having a gene for type 1 diabetes that is passed from parent to child (inherited).  Living in an area with cold weather conditions.  Exposure to certain viruses.  Certain conditions in which the body's disease-fighting (immune) system attacks itself (autoimmune disorders).  Being overweight or obese.  Having an inactive (sedentary) lifestyle.  Having been diagnosed with insulin resistance.  Having a history of prediabetes, gestational diabetes, or polycystic ovarian syndrome (PCOS).  Being of American-Indian, African-American, Hispanic/Latino, or Asian/Pacific Islander descent. What are the signs or symptoms? Hyperglycemia may not cause any symptoms. If you do have symptoms, they may include early warning signs, such as:  Increased thirst.  Hunger.  Feeling very tired.  Needing to urinate more often than usual.  Blurry vision. Other symptoms may develop if hyperglycemia gets worse, such as:  Dry mouth.  Loss of appetite.  Fruity-smelling breath.  Weakness.  Unexpected or rapid weight gain or weight loss.  Tingling or numbness in the hands or feet.  Headache.  Skin that does not quickly return to normal after being lightly pinched and  released (poor skin turgor).  Abdominal pain.  Cuts or bruises that are slow to heal. How is this diagnosed? Hyperglycemia is diagnosed with a blood test to measure your blood glucose level. This blood test is usually done while you are having symptoms. Your health care provider may also do a physical exam and review your medical history. You may have more tests to determine the cause of your hyperglycemia, such as:  A fasting blood glucose (FBG) test. You will not be allowed to eat (you will fast) for at least 8 hours before a blood sample is taken.  An A1c (hemoglobin A1c) blood test. This provides information about blood glucose control over the previous 2-3 months.  An oral glucose tolerance test (OGTT). This measures your blood glucose at two times: ? After fasting. This is your baseline blood glucose level. ? Two hours after drinking a beverage that contains glucose. How is this treated? Treatment depends on the cause of your hyperglycemia. Treatment may include:  Taking medicine to regulate your blood glucose levels. If you take insulin or other diabetes medicines, your medicine or dosage may be adjusted.  Lifestyle changes, such as exercising more, eating healthier foods, or losing weight.  Treating an illness or infection, if this caused your hyperglycemia.  Checking your blood glucose more often.  Stopping or reducing steroid medicines, if these caused your hyperglycemia. If your hyperglycemia becomes severe and it results in hyperglycemic hyperosmolar state, you must be hospitalized and given IV fluids. Follow these instructions at home:  General instructions  Take over-the-counter and prescription medicines only as told by your health care  provider.  Do not use any products that contain nicotine or tobacco, such as cigarettes and e-cigarettes. If you need help quitting, ask your health care provider.  Limit alcohol intake to no more than 1 drink per day for  nonpregnant women and 2 drinks per day for men. One drink equals 12 oz of beer, 5 oz of wine, or 1 oz of hard liquor.  Learn to manage stress. If you need help with this, ask your health care provider.  Keep all follow-up visits as told by your health care provider. This is important. Eating and drinking   Maintain a healthy weight.  Exercise regularly, as directed by your health care provider.  Stay hydrated, especially when you exercise, get sick, or spend time in hot temperatures.  Eat healthy foods, such as: ? Lean proteins. ? Complex carbohydrates. ? Fresh fruits and vegetables. ? Low-fat dairy products. ? Healthy fats.  Drink enough fluid to keep your urine clear or pale yellow. If you have diabetes:  Make sure you know the symptoms of hyperglycemia.  Follow your diabetes management plan, as told by your health care provider. Make sure you: ? Take your insulin and medicines as directed. ? Follow your exercise plan. ? Follow your meal plan. Eat on time, and do not skip meals. ? Check your blood glucose as often as directed. Make sure to check your blood glucose before and after exercise. If you exercise longer or in a different way than usual, check your blood glucose more often. ? Follow your sick day plan whenever you cannot eat or drink normally. Make this plan in advance with your health care provider.  Share your diabetes management plan with people in your workplace, school, and household.  Check your urine for ketones when you are ill and as told by your health care provider.  Carry a medical alert card or wear medical alert jewelry. Contact a health care provider if:  Your blood glucose is at or above 240 mg/dL (13.3 mmol/L) for 2 days in a row.  You have problems keeping your blood glucose in your target range.  You have frequent episodes of hyperglycemia. Get help right away if:  You have difficulty breathing.  You have a change in how you think, feel,  or act (mental status).  You have nausea or vomiting that does not go away. These symptoms may represent a serious problem that is an emergency. Do not wait to see if the symptoms will go away. Get medical help right away. Call your local emergency services (911 in the [place] Do not drive yourself to the hospital. Summary  Hyperglycemia occurs when the level of sugar (glucose) in the blood is too high.  Hyperglycemia is diagnosed with a blood test to measure your blood glucose level. This blood test is usually done while you are having symptoms. Your health care provider may also do a physical exam and review your medical history.  If you have diabetes, follow your diabetes management plan as told by your health care provider.  Contact your health care provider if you have problems keeping your blood glucose in your target range. This information is not intended to replace advice given to you by your health care provider. Make sure you discuss any questions you have with your health care provider. Document Revised: 07/18/2016 Document Reviewed: 07/18/2016 Elsevier Patient Education  [place].

## 2020-02-24 NOTE — Progress Notes (Signed)
Subjective: Debra Woodward presents today for follow up of follow up for use of compounded topical antifungal therapy from [place] and painful mycotic nails b/l that are difficult to trim. Pain interferes with ambulation. Aggravating factors include wearing enclosed shoe gear. Pain is relieved with periodic professional debridement.   She states she is pleased with the toenail medication thus far. She notices an improvement in the appearance of her toenails.  Allergies  Allergen Reactions  . Bee Venom Hives  . Prednisone Other (See Comments)    hallucinations     Objective: There were no vitals filed for this visit.  Pt 69 y.o. year old African American female  in NAD. AAO x 3.   Vascular Examination:  Capillary refill time to digits immediate b/l. Palpable DP pulses b/l. Palpable PT pulses b/l. Pedal hair present b/l. Skin temperature gradient within normal limits b/l.  Dermatological Examination: Pedal skin with normal turgor, texture and tone bilaterally. No open wounds bilaterally. No interdigital macerations bilaterally. Toenails 1-5 b/l elongated, dystrophic, thickened, crumbly with subungual debris and tenderness to dorsal palpation Noticeable improvement of distal 1/3 of digits. Well healed surgical scar dorsal aspect left 5th metatarsal. Well coapted. No erythema, no edema..  Musculoskeletal: Normal muscle strength 5/5 to all lower extremity muscle groups bilaterally, no gross bony deformities bilaterally and no pain crepitus or joint limitation noted with ROM b/l  Neurological: Protective sensation intact 5/5 intact bilaterally with 10g monofilament b/l Vibratory sensation intact b/l.  Assessment: 1. Pain due to onychomycosis of toenail    Plan: -Toenails 1-5 b/l were debrided in length and girth with sterile nail nippers and dremel without iatrogenic bleeding. Continue compounded topical antifungal solution to toenails once daily. -Patient to continue soft,  supportive shoe gear daily. -Patient to report any pedal injuries to medical professional immediately. -Patient/POA to call should there be question/concern in the interim.  Return in about 3 months (around 05/20/2020).

## 2020-03-17 ENCOUNTER — Other Ambulatory Visit: Payer: Self-pay

## 2020-03-18 ENCOUNTER — Encounter: Payer: Self-pay | Admitting: Nurse Practitioner

## 2020-03-18 ENCOUNTER — Ambulatory Visit: Payer: PPO | Admitting: Nurse Practitioner

## 2020-03-18 VITALS — BP 122/80 | Ht 65.0 in | Wt 182.0 lb

## 2020-03-18 DIAGNOSIS — N952 Postmenopausal atrophic vaginitis: Secondary | ICD-10-CM | POA: Diagnosis not present

## 2020-03-18 DIAGNOSIS — N898 Other specified noninflammatory disorders of vagina: Secondary | ICD-10-CM | POA: Diagnosis not present

## 2020-03-18 DIAGNOSIS — Z01419 Encounter for gynecological examination (general) (routine) without abnormal findings: Secondary | ICD-10-CM | POA: Diagnosis not present

## 2020-03-18 DIAGNOSIS — Z13820 Encounter for screening for osteoporosis: Secondary | ICD-10-CM | POA: Diagnosis not present

## 2020-03-18 LAB — WET PREP FOR TRICH, YEAST, CLUE

## 2020-03-18 NOTE — Patient Instructions (Signed)
Health Maintenance After Age 69 After age 69, you are at a higher risk for certain long-term diseases and infections as well as injuries from falls. Falls are a major cause of broken bones and head injuries in people who are older than age 69. Getting regular preventive care can help to keep you healthy and well. Preventive care includes getting regular testing and making lifestyle changes as recommended by your health care provider. Talk with your health care provider about:  Which screenings and tests you should have. A screening is a test that checks for a disease when you have no symptoms.  A diet and exercise plan that is right for you. What should I know about screenings and tests to prevent falls? Screening and testing are the best ways to find a health problem early. Early diagnosis and treatment give you the best chance of managing medical conditions that are common after age 69. Certain conditions and lifestyle choices may make you more likely to have a fall. Your health care provider may recommend:  Regular vision checks. Poor vision and conditions such as cataracts can make you more likely to have a fall. If you wear glasses, make sure to get your prescription updated if your vision changes.  Medicine review. Work with your health care provider to regularly review all of the medicines you are taking, including over-the-counter medicines. Ask your health care provider about any side effects that may make you more likely to have a fall. Tell your health care provider if any medicines that you take make you feel dizzy or sleepy.  Osteoporosis screening. Osteoporosis is a condition that causes the bones to get weaker. This can make the bones weak and cause them to break more easily.  Blood pressure screening. Blood pressure changes and medicines to control blood pressure can make you feel dizzy.  Strength and balance checks. Your health care provider may recommend certain tests to check your  strength and balance while standing, walking, or changing positions.  Foot health exam. Foot pain and numbness, as well as not wearing proper footwear, can make you more likely to have a fall.  Depression screening. You may be more likely to have a fall if you have a fear of falling, feel emotionally low, or feel unable to do activities that you used to do.  Alcohol use screening. Using too much alcohol can affect your balance and may make you more likely to have a fall. What actions can I take to lower my risk of falls? General instructions  Talk with your health care provider about your risks for falling. Tell your health care provider if: ? You fall. Be sure to tell your health care provider about all falls, even ones that seem minor. ? You feel dizzy, sleepy, or off-balance.  Take over-the-counter and prescription medicines only as told by your health care provider. These include any supplements.  Eat a healthy diet and maintain a healthy weight. A healthy diet includes low-fat dairy products, low-fat (lean) meats, and fiber from whole grains, beans, and lots of fruits and vegetables. Home safety  Remove any tripping hazards, such as rugs, cords, and clutter.  Install safety equipment such as grab bars in bathrooms and safety rails on stairs.  Keep rooms and walkways well-lit. Activity   Follow a regular exercise program to stay fit. This will help you maintain your balance. Ask your health care provider what types of exercise are appropriate for you.  If you need a cane or   walker, use it as recommended by your health care provider.  Wear supportive shoes that have nonskid soles. Lifestyle  Do not drink alcohol if your health care provider tells you not to drink.  If you drink alcohol, limit how much you have: ? 0-1 drink a day for women. ? 0-2 drinks a day for men.  Be aware of how much alcohol is in your drink. In the [place] one drink equals one typical bottle of beer (12  oz), one-half glass of wine (5 oz), or one shot of hard liquor (1 oz).  Do not use any products that contain nicotine or tobacco, such as cigarettes and e-cigarettes. If you need help quitting, ask your health care provider. Summary  Having a healthy lifestyle and getting preventive care can help to protect your health and wellness after age 69.  Screening and testing are the best way to find a health problem early and help you avoid having a fall. Early diagnosis and treatment give you the best chance for managing medical conditions that are more common for people who are older than age 69.  Falls are a major cause of broken bones and head injuries in people who are older than age 69. Take precautions to prevent a fall at home.  Work with your health care provider to learn what changes you can make to improve your health and wellness and to prevent falls. This information is not intended to replace advice given to you by your health care provider. Make sure you discuss any questions you have with your health care provider. Document Revised: 02/21/2019 Document Reviewed: 09/13/2017 Elsevier Patient Education  2020 Elsevier Inc.  

## 2020-03-18 NOTE — Progress Notes (Signed)
   Debra Woodward 11-09-1951 [id number]   History:  69 y.o. SBF G3 P2 presents for breast and pelvic exam. Complains of mild vagina itching at times without discharge and odor.Marland Kitchen  TAH in 1996 for fibroids. Normal pap and mammogram history.  Labs with primary care.  Sexually active with same partner.  Denies need for STD screening.  Gynecologic History No LMP recorded. Patient has had a hysterectomy.   Contraception: status post hysterectomy Last Pap: UNK Last mammogram: 01/15/20. Results were:nnormal DEXA: 2016.  Results: Normal with T score -0.5 at hip  Past medical history, past surgical history, family history and social history were all reviewed and documented in the EPIC chart.  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  Parents are diabetic.  2 children and 2 grandchildren.  ROS:  A ROS was performed and pertinent positives and negatives are included.  Exam:  Vitals:   03/18/20 0822  BP: 122/80  Weight: 182 lb (82.6 kg)  Height: 5\' 5"  (1.651 m)   Body mass index is 30.29 kg/m.  General appearance:  Normal Thyroid:  Symmetrical, normal in size, without palpable masses or nodularity./ Respiratory  Auscultation:  Clear without wheezing or rhonchi Cardiovascular  Auscultation:  Regular rate, without rubs, murmurs or gallops  Edema/varicosities:  Not grossly evident Abdominal  Soft,nontender, without masses, guarding or rebound.  Liver/spleen:  No organomegaly noted  Hernia:  None appreciated  Skin  Inspection: A few small dark nevi on back, skin tags on groin   Breasts: Examined lying and sitting.   Right: Without masses, retractions, discharge or axillary adenopathy.   Left: Without masses, retractions, discharge or axillary adenopathy. Gentitourinary   Ing/uinal/mons:  Normal without inguinal adenopathy  External genitalia:  Normal  BUS/Urethra/Skene's glands:  Normal  Vagina:  Normal, atrophic changes, dryness  Cervix:  Absent  Uterus:   Absent  Adnexa/parametria:     Rt: Without masses or tenderness.   Lt: Without masses or tenderness.  Anus and perineum: Normal  Wet prep negative   Assessment/Plan:  69 y.o.  SBF G3P2 for breast and pelvic exam.  Well female exam with routine gynecological exam Education provided on SBEs, importance of preventative screenings, current guidelines, high calcium diet, regular exercise, and calcium and vitamin D daily.   Vaginal itching - Plan: WET PREP FOR [place], YEAST, CLUE.  Discussed that this is probably due to atrophic changes due to menopause.  Instructed to use lubricant during sex and if symptoms persist we could try low-dose estrogen cream.  Screening for osteoporosis: Will schedule bone scan  Has seen dermatology in the past.  Instructed to go see them again for multiple moles on back and skin tags.  Follow up in 1 year for annual   [place], 9:41 AM 03/18/2020

## 2020-03-20 LAB — URINALYSIS, COMPLETE W/RFL CULTURE
Bilirubin Urine: NEGATIVE
Glucose, UA: NEGATIVE
Hyaline Cast: NONE SEEN /LPF
Ketones, ur: NEGATIVE
Leukocyte Esterase: NEGATIVE
Nitrites, Initial: NEGATIVE
Protein, ur: NEGATIVE
Specific Gravity, Urine: 1.025 (ref 1.001–1.03)
WBC, UA: NONE SEEN /HPF (ref 0–5)
pH: 6 (ref 5.0–8.0)

## 2020-03-20 LAB — URINE CULTURE
MICRO NUMBER:: 10441944
SPECIMEN QUALITY:: ADEQUATE

## 2020-03-20 LAB — CULTURE INDICATED

## 2020-04-01 ENCOUNTER — Other Ambulatory Visit: Payer: Self-pay

## 2020-04-10 ENCOUNTER — Other Ambulatory Visit: Payer: Self-pay

## 2020-04-14 ENCOUNTER — Other Ambulatory Visit: Payer: Self-pay | Admitting: Obstetrics & Gynecology

## 2020-04-14 ENCOUNTER — Other Ambulatory Visit: Payer: Self-pay

## 2020-04-14 ENCOUNTER — Ambulatory Visit (INDEPENDENT_AMBULATORY_CARE_PROVIDER_SITE_OTHER): Payer: PPO

## 2020-04-14 DIAGNOSIS — Z780 Asymptomatic menopausal state: Secondary | ICD-10-CM

## 2020-04-14 DIAGNOSIS — Z13820 Encounter for screening for osteoporosis: Secondary | ICD-10-CM

## 2020-04-15 ENCOUNTER — Ambulatory Visit: Payer: PPO | Admitting: Dermatology

## 2020-04-15 ENCOUNTER — Encounter: Payer: Self-pay | Admitting: Dermatology

## 2020-04-15 DIAGNOSIS — L821 Other seborrheic keratosis: Secondary | ICD-10-CM

## 2020-04-15 DIAGNOSIS — L918 Other hypertrophic disorders of the skin: Secondary | ICD-10-CM

## 2020-04-15 DIAGNOSIS — L729 Follicular cyst of the skin and subcutaneous tissue, unspecified: Secondary | ICD-10-CM | POA: Diagnosis not present

## 2020-04-15 NOTE — Patient Instructions (Signed)
Seborrheic Keratosis °A seborrheic keratosis is a common, noncancerous (benign) skin growth. These growths are velvety, waxy, rough, tan, brown, or black spots that appear on the skin. These skin growths can be flat or raised, and scaly. °What are the causes? °The cause of this condition is not known. °What increases the risk? °You are more likely to develop this condition if you: °· Have a family history of seborrheic keratosis. °· Are 50 or older. °· Are pregnant. °· Have had estrogen replacement therapy. °What are the signs or symptoms? °Symptoms of this condition include growths on the face, chest, shoulders, back, or other areas. These growths: °· Are usually painless, but may become irritated and itchy. °· Can be yellow, brown, black, or other colors. °· Are slightly raised or have a flat surface. °· Are sometimes rough or wart-like in texture. °· Are often velvety or waxy on the surface. °· Are round or oval-shaped. °· Often occur in groups, but may occur as a single growth. °How is this diagnosed? °This condition is diagnosed with a medical history and physical exam. °· A sample of the growth may be tested (skin biopsy). °· You may need to see a skin specialist (dermatologist). °How is this treated? °Treatment is not usually needed for this condition, unless the growths are irritated or bleed often. °· You may also choose to have the growths removed if you do not like their appearance. °? Most commonly, these growths are treated with a procedure in which liquid nitrogen is applied to "freeze" off the growth (cryosurgery). °? They may also be burned off with electricity (electrocautery) or removed by scraping (curettage). °Follow these instructions at home: °· Watch your growth for any changes. °· Keep all follow-up visits as told by your health care provider. This is important. °· Do not scratch or pick at the growth or growths. This can cause them to become irritated or infected. °Contact a health care  provider if: °· You suddenly have many new growths. °· Your growth bleeds, itches, or hurts. °· Your growth suddenly becomes larger or changes color. °Summary °· A seborrheic keratosis is a common, noncancerous (benign) skin growth. °· Treatment is not usually needed for this condition, unless the growths are irritated or bleed often. °· Watch your growth for any changes. °· Contact a health care provider if you suddenly have many new growths or your growth suddenly becomes larger or changes color. °· Keep all follow-up visits as told by your health care provider. This is important. °This information is not intended to replace advice given to you by your health care provider. Make sure you discuss any questions you have with your health care provider. °Document Revised: 03/15/2018 Document Reviewed: 03/15/2018 °Elsevier Patient Education © 2020 Elsevier Inc. ° °

## 2020-04-23 ENCOUNTER — Encounter: Payer: Self-pay | Admitting: Dermatology

## 2020-04-23 NOTE — Progress Notes (Signed)
   New Patient   Subjective  Debra Woodward is a 68 y.o. female who presents for the following: Skin Problem (groin tags ).  Skin tags Location: Groin Duration: Few years Quality: Prone to irritation Associated Signs/Symptoms: Modifying Factors:  Severity:  Timing: Context: Patient desires removal (along with growths on cheeks).   The following portions of the chart were reviewed this encounter and updated as appropriate: Tobacco  Allergies  Meds  Problems  Med Hx  Surg Hx  Fam Hx      Objective  Well appearing patient in no apparent distress; mood and affect are within normal limits.  A focused examination was performed including Face, neck, back, lower abdomen, vaginal-vulvar area, upper legs.. Relevant physical exam findings are noted in the Assessment and Plan.   Assessment & Plan  Skin tag (8) Pubic (2); Left Lower Back; Right Lower Back; Left Malar Cheek; Right Malar Cheek; Right Genitocrural Fold; Left Labium Majus  Epidermal / dermal shaving - Left Labium Majus, Left Lower Back, Right Genitocrural Fold, Right Lower Back  Informed consent: discussed and consent obtained   Timeout: patient name, date of birth, surgical site, and procedure verified   Procedure prep:  Patient was prepped and draped in usual sterile fashion Instrument used: scissors   Hemostasis achieved with: ferric subsulfate    Destruction of lesion - Left Labium Majus, Left Lower Back, Left Malar Cheek, Pubic (2), Right Genitocrural Fold, Right Lower Back, Right Malar Cheek  Cyst of skin (2) Mid Back  Patient told this is safe to leave if stable.  Should she desire removal, she will schedule 30 minutes.  Seborrheic keratosis (2) Head - Anterior (Face)  2 largest lesions cauterized.  Told these could either regrow or leave discoloration.

## 2020-04-28 DIAGNOSIS — H40033 Anatomical narrow angle, bilateral: Secondary | ICD-10-CM | POA: Diagnosis not present

## 2020-04-28 DIAGNOSIS — H1045 Other chronic allergic conjunctivitis: Secondary | ICD-10-CM | POA: Diagnosis not present

## 2020-04-28 DIAGNOSIS — H40023 Open angle with borderline findings, high risk, bilateral: Secondary | ICD-10-CM | POA: Diagnosis not present

## 2020-05-20 ENCOUNTER — Other Ambulatory Visit: Payer: Self-pay

## 2020-05-20 ENCOUNTER — Encounter: Payer: Self-pay | Admitting: Podiatry

## 2020-05-20 ENCOUNTER — Ambulatory Visit: Payer: PPO | Admitting: Podiatry

## 2020-05-20 DIAGNOSIS — B351 Tinea unguium: Secondary | ICD-10-CM | POA: Diagnosis not present

## 2020-05-20 DIAGNOSIS — M79676 Pain in unspecified toe(s): Secondary | ICD-10-CM | POA: Diagnosis not present

## 2020-05-20 NOTE — Patient Instructions (Signed)

## 2020-05-24 NOTE — Progress Notes (Signed)
Subjective:  Patient ID: Debra Woodward, female    DOB: [birth year],  MRN: [id number]  [name] presents to clinic today for painful thick toenails that are difficult to trim. Pain interferes with ambulation. Aggravating factors include wearing enclosed shoe gear. Pain is relieved with periodic professional debridement..  69 y.o. female presents with the above complaint.  Reports painfully elongated nails to both feet.  Review of Systems: Negative except as noted in the HPI. Past Medical History:  Diagnosis Date  . ALLERGIC RHINITIS 09/14/2006  . Allergy   . Fibroid   . Hx of adenomatous colonic polyps 07/28/2015  . Seasonal allergies    Past Surgical History:  Procedure Laterality Date  . ABDOMINAL HYSTERECTOMY  1996  . BREAST EXCISIONAL BIOPSY Left 1983   No scar seen  . BREAST EXCISIONAL BIOPSY Right 1987   NO scar seen  . BREAST SURGERY Bilateral 1983, 1996   benign  . CARPAL TUNNEL RELEASE Right 2008  . COLONOSCOPY  07/23/2015  . CYSTECTOMY Bilateral 1983, 1988   breast; benign  . KNEE ARTHROSCOPY Right 2010  . TOE SURGERY Bilateral   . TOTAL KNEE ARTHROPLASTY Right 2011    Current Outpatient Medications:  .  Ascorbic Acid (VITAMIN C) 500 MG CAPS, Take 1 capsule by mouth daily., Disp: , Rfl:  .  cetirizine (ZYRTEC) 10 MG tablet, Take 10 mg by mouth daily. Patient alternates with Allegra, Disp: , Rfl:  .  Cholecalciferol (VITAMIN D3) 2000 UNITS capsule, Take 2,000 Units by mouth daily., Disp: , Rfl:  .  diclofenac sodium (VOLTAREN) 1 % GEL, Apply 2 g topically 4 (four) times daily. , Disp: , Rfl:  .  docusate sodium (COLACE) 100 MG capsule, Take 1 capsule (100 mg total) by mouth every 12 (twelve) hours., Disp: 30 capsule, Rfl: 0 .  fexofenadine (ALLEGRA) 60 MG tablet, Take 60 mg by mouth daily. Patient alternates with Zyrtec, Disp: , Rfl:  .  fluticasone (FLONASE) 50 MCG/ACT nasal spray, SMARTSIG:2 Spray(s) Both Nares Every Night, Disp: , Rfl:  .  FLUZONE HIGH-DOSE  QUADRIVALENT 0.7 ML SUSY, , Disp: , Rfl:  .  Multiple Vitamin (MULTIVITAMIN) tablet, Take 1 tablet by mouth daily.  , Disp: , Rfl:  .  NONFORMULARY OR COMPOUNDED ITEM, Apply 1-2 g topically daily. [facility]: Nail laquer, Disp: 120 each, Rfl: 3 .  polyethylene glycol (MIRALAX / GLYCOLAX) 17 g packet, Take 17 g by mouth daily., Disp: 14 each, Rfl: 0 .  Propylene Glycol (SYSTANE COMPLETE OP), Apply to eye., Disp: , Rfl:  Allergies  Allergen Reactions  . Bee Venom Hives  . Prednisone Other (See Comments)    hallucinations   @SHSOC @ Objective:   Constitutional Debra Woodward is a pleasant 69 y.o. African American female, WD, WN in NAD.Marland Kitchen AAO x 3.   Vascular Dorsalis pedis pulses palpable bilaterally. Posterior tibial pulses palpable bilaterally. Capillary refill normal to all digits.  No cyanosis or clubbing noted. Pedal hair growth normal.  Neurologic Normal speech. Oriented to person, place, and time. Epicritic sensation to light touch grossly present bilaterally. Protective sensation intact with 10 gram monofilament b/l LE.  Dermatologic Pedal skin with normal turgor, texture and tone b/l.  Nails thick, elongated, dystrophic with pain on palpation x 10. Well healed longitudinal surgical scar noted along dorsal aspect of 5th metatarsal. Palpable nodule noted subcutaneously. Firm. No erythema, no edema, no warmth, no drainage. No open wounds.  Orthopedic: Normal joint ROM without pain or crepitus bilaterally. No visible deformities. No bony  tenderness.   Radiographs: None Assessment:   1. Pain due to onychomycosis of toenail    Plan:  Patient was evaluated and treated and all questions answered.  Onychomycosis with pain -Nails palliatively debridement as below -Educated on self-care  Procedure: Nail Debridement Rationale: Pain Type of Debridement: manual, sharp debridement. Instrumentation: Nail nipper, rotary burr. Number of Nails: 10 -Examined patient. -She  will schedule appointment with Dr. Milinda Pointer regarding her left foot.  -Toenails 1-5 b/l were debrided in length and girth with sterile nail nippers and dremel without iatrogenic bleeding.  -Patient to report any pedal injuries to medical professional immediately. -Patient to continue soft, supportive shoe gear daily. -Patient/POA to call should there be question/concern in the interim.  Return in about 3 months (around 08/20/2020) for nail trim.  Marzetta Board, DPM

## 2020-06-01 ENCOUNTER — Other Ambulatory Visit: Payer: Self-pay

## 2020-06-01 ENCOUNTER — Telehealth (INDEPENDENT_AMBULATORY_CARE_PROVIDER_SITE_OTHER): Payer: PPO | Admitting: Internal Medicine

## 2020-06-01 ENCOUNTER — Encounter: Payer: Self-pay | Admitting: Internal Medicine

## 2020-06-01 VITALS — Temp 99.6°F | Ht 65.0 in | Wt 176.0 lb

## 2020-06-01 DIAGNOSIS — L298 Other pruritus: Secondary | ICD-10-CM

## 2020-06-01 MED ORDER — METHYLPREDNISOLONE 4 MG PO TBPK: ORAL_TABLET | ORAL | 0 refills | Status: DC

## 2020-06-01 NOTE — Progress Notes (Signed)
Virtual Visit via Video Note  I connected with@ on 06/01/20 at  3:00 PM EDT by a video enabled telemedicine application and verified that I am speaking with the correct person using two identifiers. Location patient: home Location provider:work  office Persons participating in the virtual visit: patient, provider  [place]th national recommendations  regarding COVID 19 pandemic   video visit is advised over in office visit for this patient.  Patient aware  of the limitations of evaluation and management by telemedicine and  availability of in person appointments. and agreed to proceed.   HPI: Debra Woodward presents for video visit sda concern about  Very itchy rash  For 2-3 weeks Began on thigh and then right arm and now on back and feels like getting on chin  Had one ankle but no dx ( seen at podiatry for toenail fungus)   No bug bittes known or tick bites and "mummifies" self tfor outside . On zyrtec daily calamine not that helpful  Desonide helped some  Left over  From prev rx . No one else with rash  Present for about 2 weeks   No blisters and not from plants .  No chills but says has  Temp 100 in evenings  When feels hot and red ?  No new medications  Itching keeps her up at night  Has pigment when fades  Comes and goes  No pets with ithcy rash  ROS: See pertinent positives and negatives per HPI.  Past Medical History:  Diagnosis Date  . ALLERGIC RHINITIS 09/14/2006  . Allergy   . Fibroid   . Hx of adenomatous colonic polyps 07/28/2015  . Seasonal allergies     Past Surgical History:  Procedure Laterality Date  . ABDOMINAL HYSTERECTOMY  1996  . BREAST EXCISIONAL BIOPSY Left 1983   No scar seen  . BREAST EXCISIONAL BIOPSY Right 1987   NO scar seen  . BREAST SURGERY Bilateral 1983, 1996   benign  . CARPAL TUNNEL RELEASE Right 2008  . COLONOSCOPY  07/23/2015  . CYSTECTOMY Bilateral 1983, 1988   breast; benign  . KNEE ARTHROSCOPY Right 2010  . TOE SURGERY Bilateral   . TOTAL  KNEE ARTHROPLASTY Right 2011    Family History  Problem Relation Age of Onset  . Stroke Mother   . Diabetes Mother   . Diabetes Father   . Hypertension Sister   . Esophageal cancer Paternal Grandfather 76  . Colon cancer Neg Hx   . Stomach cancer Neg Hx   . Rectal cancer Neg Hx   . Breast cancer Neg Hx   . Colon polyps Neg Hx     Social History   Tobacco Use  . Smoking status: Current Every Day Smoker    Packs/day: 0.25    Years: 51.00    Pack years: 12.75    Types: Cigarettes    Start date: 92  . Smokeless tobacco: Never Used  . Tobacco comment: 4 cigs a day (09/28/17)  Vaping Use  . Vaping Use: Never used  Substance Use Topics  . Alcohol use: Yes    Alcohol/week: 2.0 standard drinks    Types: 2 Glasses of wine per week    Comment: social  . Drug use: No      Current Outpatient Medications:  .  Ascorbic Acid (VITAMIN C) 500 MG CAPS, Take 1 capsule by mouth daily., Disp: , Rfl:  .  cetirizine (ZYRTEC) 10 MG tablet, Take 10 mg by mouth daily. Patient alternates with  Allegra, Disp: , Rfl:  .  Cholecalciferol (VITAMIN D3) 2000 UNITS capsule, Take 2,000 Units by mouth daily., Disp: , Rfl:  .  diclofenac sodium (VOLTAREN) 1 % GEL, Apply 2 g topically 4 (four) times daily. , Disp: , Rfl:  .  docusate sodium (COLACE) 100 MG capsule, Take 1 capsule (100 mg total) by mouth every 12 (twelve) hours., Disp: 30 capsule, Rfl: 0 .  fexofenadine (ALLEGRA) 60 MG tablet, Take 60 mg by mouth daily. Patient alternates with Zyrtec, Disp: , Rfl:  .  fluticasone (FLONASE) 50 MCG/ACT nasal spray, SMARTSIG:2 Spray(s) Both Nares Every Night, Disp: , Rfl:  .  FLUZONE HIGH-DOSE QUADRIVALENT 0.7 ML SUSY, , Disp: , Rfl:  .  Multiple Vitamin (MULTIVITAMIN) tablet, Take 1 tablet by mouth daily.  , Disp: , Rfl:  .  NONFORMULARY OR COMPOUNDED ITEM, Apply 1-2 g topically daily. [facility]: Nail laquer, Disp: 120 each, Rfl: 3 .  polyethylene glycol (MIRALAX / GLYCOLAX) 17 g packet, Take 17  g by mouth daily., Disp: 14 each, Rfl: 0 .  Propylene Glycol (SYSTANE COMPLETE OP), Apply to eye., Disp: , Rfl:  .  methylPREDNISolone (MEDROL DOSEPAK) 4 MG TBPK tablet, As directed, Disp: 1 each, Rfl: 0  EXAM: BP Readings from Last 3 Encounters:  03/18/20 122/80  11/25/19 124/70  10/02/19 130/82    VITALS per patient if applicable:  GENERAL: alert, oriented, appears well and in no acute distress no edema facial or otherw ise obv   HEENT: atraumatic, conjunttiva clear, no obvious abnormalities on inspection of external nose and ears  NECK: normal movements of the head and neck  LUNGS: on inspection no signs of respiratory distress, breathing rate appears normal, no obvious gross SOB, gasping or wheezing  CV: no obvious cyanosis Skin hard to  define but round indistinct to ovid blotches separate  Upper back  And faded area  Right arm  MS: moves all visible extremities without noticeable abnormality  PSYCH/NEURO: pleasant and cooperative, no obvious depression or anxiety, speech and thought processing grossly intact   ASSESSMENT AND PLAN:  Discussed the following assessment and plan:    ICD-10-CM   1. Pruritic erythematous rash  W97.9    uncertain cause  consider tinea doubt with presentation and  Looks like a ovoid eczema but no hx of same ans is very pruritis  Uncertain significance of   t 100 in pm when "feels hot" not chilled   doesn sound like infection   Sounds allergic  But uncertain cause and may need more work up. If  persistent or progressive  Contact us in 3-4 days pre weekend about status   Pt aware trying medrol dose pack ( instead of predn says doesn't drive when on pred and has cns sx )  Counseled.   Expectant management and discussion of plan and treatment with opportunity to ask questions and all were answered. The patient agreed with the plan and demonstrated an understanding of the instructions.   Advised to call back or seek an in-person evaluation if  worsening  or having  further concerns . Return for plan contact about how doing in 4-5 days before weekend . Time and review and plan for fu and counsel  32 minutes  Asked her toa ke a better picture of rash   Shanon Ace, MD

## 2020-06-02 ENCOUNTER — Other Ambulatory Visit: Payer: Self-pay | Admitting: Internal Medicine

## 2020-06-04 ENCOUNTER — Other Ambulatory Visit: Payer: Self-pay | Admitting: Podiatry

## 2020-06-04 ENCOUNTER — Other Ambulatory Visit: Payer: Self-pay

## 2020-06-04 ENCOUNTER — Telehealth: Payer: Self-pay | Admitting: Internal Medicine

## 2020-06-04 ENCOUNTER — Ambulatory Visit (INDEPENDENT_AMBULATORY_CARE_PROVIDER_SITE_OTHER): Payer: PPO

## 2020-06-04 ENCOUNTER — Encounter: Payer: Self-pay | Admitting: Podiatry

## 2020-06-04 ENCOUNTER — Ambulatory Visit: Payer: PPO | Admitting: Podiatry

## 2020-06-04 DIAGNOSIS — M898X7 Other specified disorders of bone, ankle and foot: Secondary | ICD-10-CM

## 2020-06-04 DIAGNOSIS — M778 Other enthesopathies, not elsewhere classified: Secondary | ICD-10-CM

## 2020-06-04 NOTE — Telephone Encounter (Signed)
The patient called to let Dr. Regis Bill know that she has had some itching and one break out but is clearing up.  She is half way through methylPREDNISolone (MEDROL DOSEPAK) 4 MG TBPK tablet  FYI

## 2020-06-04 NOTE — Progress Notes (Signed)
She presents today chief complaint of a painful lesion dorsal aspect fifth metatarsophalangeal joint left foot where we removed the screw after 1/5 metatarsal osteotomy.  She states that there is some little bubble that rubs my shoes when I walk that is painful right here she points to the fifth metatarsal phalangeal joint dorsally.  Objective: Vitals are stable she alert oriented x3 pulses are palpable.  Neurologic sensorium is intact.  No open lesions or wounds are noted.  She has a small pea-sized fluctuant lesion most likely ganglion not visible on radiograph at the metatarsophalangeal joint.  This is directly beneath the scar distally.  Assessment: Ganglion cyst.  Plan: At this point consented her for excision ganglion cyst dorsal aspect left foot.  We did discuss the possible postop complications which may include but not limited to postop pain bleeding swelling infection recurrence need for further surgery overcorrection under correction loss of digit loss of limb loss of life.  She understands this is amenable to it we we will see her back at the time of surgery.

## 2020-06-05 NOTE — Telephone Encounter (Signed)
Please see message. °

## 2020-06-05 NOTE — Telephone Encounter (Signed)
ggod thanks for the update  And see how does   After the weekend .

## 2020-06-05 NOTE — Telephone Encounter (Signed)
Called patient and LMOVM to return call  Left a voice message for patient with message from Dr. Regis Bill.

## 2020-06-09 ENCOUNTER — Other Ambulatory Visit: Payer: Self-pay

## 2020-06-16 ENCOUNTER — Telehealth: Payer: Self-pay | Admitting: Internal Medicine

## 2020-06-16 NOTE — Telephone Encounter (Signed)
Left message for patient to schedule Annual Wellness Visit.  Please schedule with Nurse Health Advisor Shannon Crews, RN at [facility]  

## 2020-06-22 ENCOUNTER — Other Ambulatory Visit: Payer: Self-pay

## 2020-06-22 ENCOUNTER — Ambulatory Visit (INDEPENDENT_AMBULATORY_CARE_PROVIDER_SITE_OTHER): Payer: PPO

## 2020-06-22 VITALS — BP 120/78 | HR 79 | Temp 98.6°F | Ht 65.0 in | Wt 183.0 lb

## 2020-06-22 DIAGNOSIS — Z0000 Encounter for general adult medical examination without abnormal findings: Secondary | ICD-10-CM

## 2020-06-22 NOTE — Patient Instructions (Signed)
Debra Woodward , Thank you for taking time to come for your Medicare Wellness Visit. I appreciate your ongoing commitment to your health goals. Please review the following plan we discussed and let me know if I can assist you in the future.   Screening recommendations/referrals: Colonoscopy: Up to date, next due 10/16/2023  Mammogram: Up to date, next due 01/14/2021 Bone Density: Up to date, next due 04/14/2022 Recommended yearly ophthalmology/optometry visit for glaucoma screening and checkup Recommended yearly dental visit for hygiene and checkup  Vaccinations: Influenza vaccine: Up to date, due this fall Pneumococcal vaccine: Completed series  Tdap vaccine: Up to date, next due 01/25/2018 Shingles vaccine: Completed series    Advanced directives: Please bring a copy with you to your next office visit so that we may scan it into your chart.  Conditions/risks identified: None  Next appointment: 08/19/2020 @ 4:00 PM via telephone with Pharmacist at [facility] 69 Years and Older, Female Preventive care refers to lifestyle choices and visits with your health care provider that can promote health and wellness. What does preventive care include?  A yearly physical exam. This is also called an annual well check.  Dental exams once or twice a year.  Routine eye exams. Ask your health care provider how often you should have your eyes checked.  Personal lifestyle choices, including:  Daily care of your teeth and gums.  Regular physical activity.  Eating a healthy diet.  Avoiding tobacco and drug use.  Limiting alcohol use.  Practicing safe sex.  Taking low-dose aspirin every day.  Taking vitamin and mineral supplements as recommended by your health care provider. What happens during an annual well check? The services and screenings done by your health care provider during your annual well check will depend on your age, overall health, lifestyle risk  factors, and family history of disease. Counseling  Your health care provider may ask you questions about your:  Alcohol use.  Tobacco use.  Drug use.  Emotional well-being.  Home and relationship well-being.  Sexual activity.  Eating habits.  History of falls.  Memory and ability to understand (cognition).  Work and work Statistician.  Reproductive health. Screening  You may have the following tests or measurements:  Height, weight, and BMI.  Blood pressure.  Lipid and cholesterol levels. These may be checked every 5 years, or more frequently if you are over 11 years old.  Skin check.  Lung cancer screening. You may have this screening every year starting at age 69 if you have a 30-pack-year history of smoking and currently smoke or have quit within the past 15 years.  Fecal occult blood test (FOBT) of the stool. You may have this test every year starting at age 69.  Flexible sigmoidoscopy or colonoscopy. You may have a sigmoidoscopy every 5 years or a colonoscopy every 10 years starting at age 69.  Hepatitis C blood test.  Hepatitis B blood test.  Sexually transmitted disease (STD) testing.  Diabetes screening. This is done by checking your blood sugar (glucose) after you have not eaten for a while (fasting). You may have this done every 1-3 years.  Bone density scan. This is done to screen for osteoporosis. You may have this done starting at age 69.  Mammogram. This may be done every 1-2 years. Talk to your health care provider about how often you should have regular mammograms. Talk with your health care provider about your test results, treatment options, and if necessary, the  need for more tests. Vaccines  Your health care provider may recommend certain vaccines, such as:  Influenza vaccine. This is recommended every year.  Tetanus, diphtheria, and acellular pertussis (Tdap, Td) vaccine. You may need a Td booster every 10 years.  Zoster vaccine. You  may need this after age 69.  Pneumococcal 13-valent conjugate (PCV13) vaccine. One dose is recommended after age 69.  Pneumococcal polysaccharide (PPSV23) vaccine. One dose is recommended after age 69. Talk to your health care provider about which screenings and vaccines you need and how often you need them. This information is not intended to replace advice given to you by your health care provider. Make sure you discuss any questions you have with your health care provider. Document Released: 11/27/2015 Document Revised: 07/20/2016 Document Reviewed: 09/01/2015 Elsevier Interactive Patient Education  2017 [place] Prevention in the Home Falls can cause injuries. They can happen to people of all ages. There are many things you can do to make your home safe and to help prevent falls. What can I do on the outside of my home?  Regularly fix the edges of walkways and driveways and fix any cracks.  Remove anything that might make you trip as you walk through a door, such as a raised step or threshold.  Trim any bushes or trees on the path to your home.  Use bright outdoor lighting.  Clear any walking paths of anything that might make someone trip, such as rocks or tools.  Regularly check to see if handrails are loose or broken. Make sure that both sides of any steps have handrails.  Any raised decks and porches should have guardrails on the edges.  Have any leaves, snow, or ice cleared regularly.  Use sand or salt on walking paths during winter.  Clean up any spills in your garage right away. This includes oil or grease spills. What can I do in the bathroom?  Use night lights.  Install grab bars by the toilet and in the tub and shower. Do not use towel bars as grab bars.  Use non-skid mats or decals in the tub or shower.  If you need to sit down in the shower, use a plastic, non-slip stool.  Keep the floor dry. Clean up any water that spills on the floor as soon as  it happens.  Remove soap buildup in the tub or shower regularly.  Attach bath mats securely with double-sided non-slip rug tape.  Do not have throw rugs and other things on the floor that can make you trip. What can I do in the bedroom?  Use night lights.  Make sure that you have a light by your bed that is easy to reach.  Do not use any sheets or blankets that are too big for your bed. They should not hang down onto the floor.  Have a firm chair that has side arms. You can use this for support while you get dressed.  Do not have throw rugs and other things on the floor that can make you trip. What can I do in the kitchen?  Clean up any spills right away.  Avoid walking on wet floors.  Keep items that you use a lot in easy-to-reach places.  If you need to reach something above you, use a strong step stool that has a grab bar.  Keep electrical cords out of the way.  Do not use floor polish or wax that makes floors slippery. If you must use wax,  use non-skid floor wax.  Do not have throw rugs and other things on the floor that can make you trip. What can I do with my stairs?  Do not leave any items on the stairs.  Make sure that there are handrails on both sides of the stairs and use them. Fix handrails that are broken or loose. Make sure that handrails are as long as the stairways.  Check any carpeting to make sure that it is firmly attached to the stairs. Fix any carpet that is loose or worn.  Avoid having throw rugs at the top or bottom of the stairs. If you do have throw rugs, attach them to the floor with carpet tape.  Make sure that you have a light switch at the top of the stairs and the bottom of the stairs. If you do not have them, ask someone to add them for you. What else can I do to help prevent falls?  Wear shoes that:  Do not have high heels.  Have rubber bottoms.  Are comfortable and fit you well.  Are closed at the toe. Do not wear sandals.  If  you use a stepladder:  Make sure that it is fully opened. Do not climb a closed stepladder.  Make sure that both sides of the stepladder are locked into place.  Ask someone to hold it for you, if possible.  Clearly mark and make sure that you can see:  Any grab bars or handrails.  First and last steps.  Where the edge of each step is.  Use tools that help you move around (mobility aids) if they are needed. These include:  Canes.  Walkers.  Scooters.  Crutches.  Turn on the lights when you go into a dark area. Replace any light bulbs as soon as they burn out.  Set up your furniture so you have a clear path. Avoid moving your furniture around.  If any of your floors are uneven, fix them.  If there are any pets around you, be aware of where they are.  Review your medicines with your doctor. Some medicines can make you feel dizzy. This can increase your chance of falling. Ask your doctor what other things that you can do to help prevent falls. This information is not intended to replace advice given to you by your health care provider. Make sure you discuss any questions you have with your health care provider. Document Released: 08/27/2009 Document Revised: 04/07/2016 Document Reviewed: 12/05/2014 Elsevier Interactive Patient Education  2017 [employer].

## 2020-06-22 NOTE — Progress Notes (Signed)
Subjective:   Debra Woodward is a 69 y.o. female who presents for Medicare Annual (Subsequent) preventive examination.  Review of Systems    N/A       Objective:    Today's Vitals   06/22/20 1352  BP: 120/78  Pulse: 79  Temp: 98.6 F (37 C)  TempSrc: Oral  SpO2: 98%  Weight: 183 lb (83 kg)  Height: 5\' 5"  (1.651 m)   Body mass index is 30.45 kg/m.  Advanced Directives 06/22/2020 06/26/2019 08/22/2018 07/09/2015  Does Patient Have a Medical Advance Directive? Yes Yes Yes Yes  Type of Paramedic of [place];Living will - - Living will;Healthcare Power of Attorney  Does patient want to make changes to medical advance directive? No - Patient declined - - -  Copy of [place] in Chart? No - copy requested - - -    Current Medications (verified)   Allergies (verified) Bee venom and Prednisone   History: Past Medical History:  Diagnosis Date  . ALLERGIC RHINITIS 09/14/2006  . Allergy   . Fibroid   . Hx of adenomatous colonic polyps 07/28/2015  . Seasonal allergies    Past Surgical History:  Procedure Laterality Date  . ABDOMINAL HYSTERECTOMY  1996  . BREAST EXCISIONAL BIOPSY Left 1983   No scar seen  . BREAST EXCISIONAL BIOPSY Right 1987   NO scar seen  . BREAST SURGERY Bilateral 1983, 1996   benign  . CARPAL TUNNEL RELEASE Right 2008  . COLONOSCOPY  07/23/2015  . CYSTECTOMY Bilateral 1983, 1988   breast; benign  . KNEE ARTHROSCOPY Right 2010  . TOE SURGERY Bilateral   . TOTAL KNEE ARTHROPLASTY Right 2011   Family History  Problem Relation Age of Onset  . Stroke Mother   . Diabetes Mother   . Diabetes Father   . Hypertension Sister   . Esophageal cancer Paternal Grandfather 49  . Colon cancer Neg Hx   . Stomach cancer Neg Hx   . Rectal cancer Neg Hx   . Breast cancer Neg Hx   . Colon polyps Neg Hx    Social History   Socioeconomic History  . Marital status: Single    Spouse name: Not on file  . Number of  children: Not on file  . Years of education: Not on file  . Highest education level: Not on file  Occupational History  . Not on file  Tobacco Use  . Smoking status: Current Every Day Smoker    Packs/day: 0.25    Years: 51.00    Pack years: 12.75    Types: Cigarettes    Start date: 55  . Smokeless tobacco: Never Used  . Tobacco comment: 4 cigs a day (09/28/17)  Vaping Use  . Vaping Use: Never used  Substance and Sexual Activity  . Alcohol use: Yes    Alcohol/week: 2.0 standard drinks    Types: 2 Glasses of wine per week    Comment: social  . Drug use: No  . Sexual activity: Yes    Birth control/protection: Surgical    Comment: HYST  Other Topics Concern  . 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    Social Determinants of Health   Financial Resource Strain: Low Risk   . Difficulty of Paying Living Expenses: Not hard  at all  Food Insecurity:   . Worried About Charity fundraiser in the Last Year:   . Arboriculturist in the Last Year:   Transportation Needs: No Transportation Needs  . Lack of Transportation (Medical): No  . Lack of Transportation (Non-Medical): No  Physical Activity: Sufficiently Active  . Days of Exercise per Week: 4 days  . Minutes of Exercise per Session: 60 min  Stress: No Stress Concern Present  . Feeling of Stress : Not at all  Social Connections: Moderately Integrated  . Frequency of Communication with Friends and Family: More than three times a week  . Frequency of Social Gatherings with Friends and Family: Once a week  . Attends Religious Services: More than 4 times per year  . Active Member of Clubs or Organizations: Yes  . Attends Archivist Meetings: More than 4 times per year  . Marital Status: Divorced    Tobacco Counseling Ready to quit: Not Answered Counseling given: Not Answered Comment: 4 cigs a day (09/28/17)   Clinical  Intake:  Pre-visit preparation completed: Yes  Pain : No/denies pain     Nutritional Risks: Unintentional weight gain (Weight gain with Prednisone) Diabetes: No  How often do you need to have someone help you when you read instructions, pamphlets, or other written materials from your doctor or pharmacy?: 1 - Never What is the last grade level you completed in school?: Associates Degree  Diabetic?No  Interpreter Needed?: No  Information entered by :: [place] of Daily Living In your present state of health, do you have any difficulty performing the following activities: 06/22/2020  Hearing? N  Vision? N  Difficulty concentrating or making decisions? N  Walking or climbing stairs? N  Dressing or bathing? N  Doing errands, shopping? N  Preparing Food and eating ? N  Using the Toilet? N  In the past six months, have you accidently leaked urine? N  Do you have problems with loss of bowel control? N  Managing your Medications? N  Managing your Finances? N  Housekeeping or managing your Housekeeping? N  Some recent data might be hidden    Patient Care Team: Panosh, [name], MD as PCP - General (Internal Medicine) Huel Cote, NP as Nurse Practitioner (Obstetrics and Gynecology) Earnie Larsson, [facility] as Pharmacist (Pharmacist)  Indicate any recent Medical Services you may have received from other than [facility] providers in the past year (date may be approximate).     Assessment:   This is a routine wellness examination for Debra Woodward.  Hearing/Vision screen  Hearing Screening   125Hz  250Hz  500Hz  1000Hz  2000Hz  3000Hz  4000Hz  6000Hz  8000Hz   Right ear:           Left ear:           Vision Screening Comments: Gets eye exams yearly   Dietary issues and exercise activities discussed: Current Exercise Habits: Home exercise routine, Type of exercise: walking, Time (Minutes): 60, Frequency (Times/Week): 4, Weekly Exercise (Minutes/Week): 240  Goals    .  Pharmacy Care Plan     CARE PLAN ENTRY  Current Barriers:  . Chronic Disease Management support, education, and care coordination needs related to  Smoking cessation, Elevated blood sugar, Allergic Rhinitis, Arthritis  Pharmacist Clinical Goal(s):  Marland Kitchen Over next 6 months work on the following goals,  o Smoking cessation - Decrease number of cigarettes o Elevated blood sugars - Work on lifestyle modifications (diet and exercise). o Arthritis . Continue  to see improvement in pain level.   Interventions: . Comprehensive medication review performed. . Smoking cessation o Continue chewing sugarless gum or eating baby carrots to minimize number of cigarettes.  . Elevated blood glucose . Keeping blood pressure and lipids under goal. We discussed modifying lifestyle, including to participate in moderate physical activity (e.g., walking) at least 150 minutes per week.  . Discussed a Mediterranean eating plan with an emphasis on whole grains, legumes, nuts, fruits, and vegetables and minimal refined and processed foods. . Allergic rhinitis . Continue:  - Alternating between Allergra 60mg , 1 tablet once daily and Zyrtec 10mg , 1 tablet once daily.  - Fluticasone (Flonase) 63mcg/act nasal spray, use 2 sprays into both nostrils every night . Arthritis . Continue:  -  diclofenac 1% gel, apply 2 grams four times daily as needed  Patient Self Care Activities:  . Self administers medications as prescribed, Calls pharmacy for medication refills, and Calls provider office for new concerns or questions  Initial goal documentation     . Weight (lb) < 170 lb (77.1 kg)     Will substitute other healthier snacks for nuts x 1 week and see how it goes     . Weight (lb) < 170 lb (77.1 kg)      Depression Screen PHQ 2/9 Scores 06/22/2020 09/23/2019 08/22/2018 09/09/2016 05/20/2015  PHQ - 2 Score 0 0 0 0 0  PHQ- 9 Score 0 - - - -    Fall Risk Fall Risk  06/22/2020 10/08/2019 09/23/2019 08/22/2018 09/09/2016    Falls in the past year? 0 0 1 No No  Comment - Emmi Telephone Survey: data to providers prior to load - - -  Number falls in past yr: 0 - 0 - -  Injury with Fall? 0 - 1 - -  Risk for fall due to : Medication side effect - - - -  Follow up Falls evaluation completed;Falls prevention discussed - Falls evaluation completed - -    Any stairs in or around the home? No  If so, are there any without handrails? No  Home free of loose throw rugs in walkways, pet beds, electrical cords, etc? Yes  Adequate lighting in your home to reduce risk of falls? Yes   ASSISTIVE DEVICES UTILIZED TO PREVENT FALLS:  Life alert? No  Use of a cane, walker or w/c? No  Grab bars in the bathroom? No  Shower chair or bench in shower? Yes  Elevated toilet seat or a handicapped toilet? No   TIMED UP AND GO:  Was the test performed? Yes .  Length of time to ambulate 10 feet: 5 sec.   Gait steady and fast without use of assistive device  Cognitive Function: MMSE - Mini Mental State Exam 08/22/2018  Not completed: (No Data)        Immunizations Immunization History  Administered Date(s) Administered  . Hep A / Hep B 01/25/2018, 02/27/2018, 08/10/2018  . Influenza Split 08/07/2012, 08/23/2014  . Influenza Whole 08/14/2001, 09/10/2007, 08/16/2010, 08/15/2011  . Influenza, High Dose Seasonal PF 08/23/2017, 08/10/2018, 09/02/2019  . Influenza,inj,Quad PF,6+ Mos 08/16/2016  . Influenza-Unspecified 08/14/2013, 08/14/2017  . PFIZER SARS-COV-2 Vaccination 12/03/2019, 12/25/2019  . Pneumococcal Conjugate-13 09/30/2016  . Pneumococcal Polysaccharide-23 11/15/2011, 09/18/2018  . Pneumococcal-Unspecified 07/15/2016  . Td 03/13/2009  . Tdap 01/25/2018  . Zoster 05/28/2012  . Zoster Recombinat (Shingrix) 03/22/2017, 12/11/2017    TDAP status: Up to date Flu Vaccine status: Up to date Pneumococcal vaccine status: Up to date Covid-19  vaccine status: Completed vaccines  Qualifies for Shingles Vaccine? Yes    Zostavax completed Yes   Shingrix Completed?: Yes  Screening Tests Health Maintenance  Topic Date Due  . INFLUENZA VACCINE  06/14/2020  . MAMMOGRAM  01/14/2022  . COLONOSCOPY  10/16/2023  . TETANUS/TDAP  01/26/2028  . DEXA SCAN  Completed  . COVID-19 Vaccine  Completed  . Hepatitis C Screening  Completed  . PNA vac Low Risk Adult  Completed    Health Maintenance  Health Maintenance Due  Topic Date Due  . INFLUENZA VACCINE  06/14/2020    Colorectal cancer screening: Completed 10/15/2018. Repeat every 5 years Mammogram status: Completed 01/15/2020. Repeat every year Bone Density status: Completed 04/14/2020. Results reflect: Bone density results: NORMAL. Repeat every 2 years.  Lung Cancer Screening: (Low Dose CT Chest recommended if Age 16-80 years, 30 pack-year currently smoking OR have quit w/in 15years.) does not qualify.   Lung Cancer Screening Referral: N/A  Additional Screening:  Hepatitis C Screening: does qualify; Completed 03/06/2013  Vision Screening: Recommended annual ophthalmology exams for early detection of glaucoma and other disorders of the eye. Is the patient up to date with their annual eye exam?  Yes  Who is the provider or what is the name of the office in which the patient attends annual eye exams? [facility]  If pt is not established with a provider, would they like to be referred to a provider to establish care? No .   Dental Screening: Recommended annual dental exams for proper oral hygiene  Community Resource Referral / Chronic Care Management: CRR required this visit?  No   CCM required this visit?  No      Plan:     I have personally reviewed and noted the following in the patient's chart:   . Medical and social history . Use of alcohol, tobacco or illicit drugs  . Current medications and supplements . Functional ability and status . Nutritional status . Physical activity . Advanced directives . List of  other physicians . Hospitalizations, surgeries, and ER visits in previous 12 months . Vitals . Screenings to include cognitive, depression, and falls . Referrals and appointments  In addition, I have reviewed and discussed with patient certain preventive protocols, quality metrics, and best practice recommendations. A written personalized care plan for preventive services as well as general preventive health recommendations were provided to patient.     Ofilia Neas, LPN   03/19/7013   Nurse Notes: None

## 2020-07-03 ENCOUNTER — Telehealth: Payer: Self-pay | Admitting: Podiatry

## 2020-07-03 NOTE — Telephone Encounter (Signed)
Pt called about scheduling her sx for Exc. Ganglion/Tumor of the Lt foot. 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 Stated this would help her be able to schedule her sx due to her upcoming work schedule.

## 2020-07-06 NOTE — Telephone Encounter (Signed)
She will be able to ambulate after her surgery.

## 2020-07-06 NOTE — Telephone Encounter (Signed)
Can you help her with scheduling? Thanks!

## 2020-07-07 NOTE — Telephone Encounter (Signed)
Called pt back to let her know she would be ambulatory after sx. Asked pt if she wanted to go ahead and get her sx scheduled. Pt requested 9/10 for her sx date. Stated she was going to call around and find a ride and if for any reason she needed to change her sx date, she would call back.

## 2020-07-08 NOTE — Telephone Encounter (Signed)
I'm calling back to confirm my sx date for 9/10. I have found someone that can drive me, stay while I have my sx, and then take me home.

## 2020-07-22 ENCOUNTER — Telehealth: Payer: Self-pay

## 2020-07-22 NOTE — Telephone Encounter (Signed)
DOS 07/24/20  EXC GANGLION/TUMOR LT - [id number]  RECEIVED FAX FROM [employer] WITH AUTH NUMBER  AUTH # [id number] FOR CPT [id number]. GOOD FROM 07/24/20 - 10/22/20

## 2020-07-23 ENCOUNTER — Other Ambulatory Visit: Payer: Self-pay | Admitting: Podiatry

## 2020-07-23 MED ORDER — HYDROCODONE-ACETAMINOPHEN 10-325 MG PO TABS: 1.0000 | ORAL_TABLET | Freq: Four times a day (QID) | ORAL | 0 refills | Status: DC | PRN

## 2020-07-23 MED ORDER — CEPHALEXIN 500 MG PO CAPS: 500.0000 mg | ORAL_CAPSULE | Freq: Three times a day (TID) | ORAL | 0 refills | Status: DC

## 2020-07-23 MED ORDER — ONDANSETRON HCL 4 MG PO TABS: 4.0000 mg | ORAL_TABLET | Freq: Three times a day (TID) | ORAL | 0 refills | Status: DC | PRN

## 2020-07-24 DIAGNOSIS — L720 Epidermal cyst: Secondary | ICD-10-CM | POA: Diagnosis not present

## 2020-07-24 DIAGNOSIS — M67472 Ganglion, left ankle and foot: Secondary | ICD-10-CM | POA: Diagnosis not present

## 2020-07-24 DIAGNOSIS — M1380 Other specified arthritis, unspecified site: Secondary | ICD-10-CM | POA: Diagnosis not present

## 2020-07-30 ENCOUNTER — Encounter: Payer: Self-pay | Admitting: Podiatry

## 2020-07-30 ENCOUNTER — Other Ambulatory Visit: Payer: Self-pay

## 2020-07-30 ENCOUNTER — Ambulatory Visit: Payer: PPO | Admitting: Podiatry

## 2020-07-30 DIAGNOSIS — M67472 Ganglion, left ankle and foot: Secondary | ICD-10-CM

## 2020-07-30 DIAGNOSIS — Z9889 Other specified postprocedural states: Secondary | ICD-10-CM

## 2020-07-30 NOTE — Progress Notes (Signed)
She presents today 1 week status post excision soft tissue tumor dorsal aspect left foot.  Date of surgery July 24, 2020.  She denies fever chills nausea vomiting muscle aches pains calf pain back pain chest pain shortness of breath.  Objective: Vital signs are stable she is alert and oriented x3 dry sterile dressing intact was removed demonstrates no erythema edema cellulitis drainage or odor sutures are intact margins appear to be well coapted there is no signs of infection no purulence.  Mild tenderness on palpation mild tenderness on range of motion.  Assessment: Well-healing surgical foot.  Plan: Redressed today dressed a compressive dressing instructed patient to continue to stay off this as much as possible keep it elevated and to start moving her toes.  We will follow-up with her in 1 week for suture removal and we are awaiting pathology results.

## 2020-08-05 ENCOUNTER — Other Ambulatory Visit: Payer: Self-pay

## 2020-08-05 ENCOUNTER — Ambulatory Visit (INDEPENDENT_AMBULATORY_CARE_PROVIDER_SITE_OTHER): Payer: PPO | Admitting: Otolaryngology

## 2020-08-05 VITALS — Temp 96.6°F

## 2020-08-05 DIAGNOSIS — H6123 Impacted cerumen, bilateral: Secondary | ICD-10-CM | POA: Diagnosis not present

## 2020-08-05 NOTE — Progress Notes (Signed)
HPI: Debra Woodward is a 69 y.o. female who presents for evaluation of wax buildup in her ears that she gets cleaned every 6 months.  She has noted some popping in her right ear.  No pain or discomfort but does complain of itching..  Past Medical History:  Diagnosis Date  . ALLERGIC RHINITIS 09/14/2006  . Allergy   . Fibroid   . Hx of adenomatous colonic polyps 07/28/2015  . Seasonal allergies    Past Surgical History:  Procedure Laterality Date  . ABDOMINAL HYSTERECTOMY  1996  . BREAST EXCISIONAL BIOPSY Left 1983   No scar seen  . BREAST EXCISIONAL BIOPSY Right 1987   NO scar seen  . BREAST SURGERY Bilateral 1983, 1996   benign  . CARPAL TUNNEL RELEASE Right 2008  . COLONOSCOPY  07/23/2015  . CYSTECTOMY Bilateral 1983, 1988   breast; benign  . KNEE ARTHROSCOPY Right 2010  . TOE SURGERY Bilateral   . TOTAL KNEE ARTHROPLASTY Right 2011   Social History   Socioeconomic History  . Marital status: Single    Spouse name: Not on file  . Number of children: Not on file  . Years of education: Not on file  . Highest education level: Not on file  Occupational History  . Not on file  Tobacco Use  . Smoking status: Current Every Day Smoker    Packs/day: 0.25    Years: 51.00    Pack years: 12.75    Types: Cigarettes    Start date: 51  . Smokeless tobacco: Never Used  . Tobacco comment: 4 cigs a day (09/28/17)  Vaping Use  . Vaping Use: Never used  Substance and Sexual Activity  . Alcohol use: Yes    Alcohol/week: 2.0 standard drinks    Types: 2 Glasses of wine per week    Comment: social  . Drug use: No  . Sexual activity: Yes    Birth control/protection: Surgical    Comment: HYST  Other Topics Concern  . 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    Social Determinants of Health   Financial Resource Strain: Low Risk   . Difficulty of Paying Living Expenses: Not hard at all   Food Insecurity:   . Worried About Charity fundraiser in the Last Year: Not on file  . Ran Out of Food in the Last Year: Not on file  Transportation Needs: No Transportation Needs  . Lack of Transportation (Medical): No  . Lack of Transportation (Non-Medical): No  Physical Activity: Sufficiently Active  . Days of Exercise per Week: 4 days  . Minutes of Exercise per Session: 60 min  Stress: No Stress Concern Present  . Feeling of Stress : Not at all  Social Connections: Moderately Integrated  . Frequency of Communication with Friends and Family: More than three times a week  . Frequency of Social Gatherings with Friends and Family: Once a week  . Attends Religious Services: More than 4 times per year  . Active Member of Clubs or Organizations: Yes  . Attends Archivist Meetings: More than 4 times per year  . Marital Status: Divorced   Family History  Problem Relation Age of Onset  . Stroke Mother   . Diabetes Mother   . Diabetes Father   . Hypertension Sister   .  Esophageal cancer Paternal Grandfather 70  . Colon cancer Neg Hx   . Stomach cancer Neg Hx   . Rectal cancer Neg Hx   . Breast cancer Neg Hx   . Colon polyps Neg Hx    Allergies  Allergen Reactions  . Bee Venom Hives  . Prednisone Other (See Comments)    hallucinations   Prior to Admission medications   Medication Sig Start Date End Date Taking? Authorizing Provider  Ascorbic Acid (VITAMIN C) 500 MG CAPS Take 1 capsule by mouth daily.   Yes [provider]  cephALEXin (KEFLEX) 500 MG capsule Take 1 capsule (500 mg total) by mouth 3 (three) times daily. 07/23/20  Yes Hyatt, Max T, DPM  cetirizine (ZYRTEC) 10 MG tablet Take 10 mg by mouth daily. Patient alternates with Allegra   Yes [provider]  Cholecalciferol (VITAMIN D3) 2000 UNITS capsule Take 2,000 Units by mouth daily.   Yes [provider]  desonide (DESOWEN) 0.05 % cream APPLY  CREAM TOPICALLY TWICE DAILY TO  ITCHY   AREA  ON  FACE  NOT  IN  THE  EYE 06/02/20  Yes Panosh, [name], MD  diclofenac sodium (VOLTAREN) 1 % GEL Apply 2 g topically 4 (four) times daily.  01/16/19  Yes [provider]  docusate sodium (COLACE) 100 MG capsule Take 1 capsule (100 mg total) by mouth every 12 (twelve) hours. 06/26/19  Yes Khatri, Hina, PA-C  fexofenadine (ALLEGRA) 60 MG tablet Take 60 mg by mouth daily. Patient alternates with Zyrtec   Yes [provider]  fluticasone (FLONASE) 50 MCG/ACT nasal spray SMARTSIG:2 Spray(s) Both Nares Every Night 11/04/19  Yes [provider]  FLUZONE HIGH-DOSE QUADRIVALENT 0.7 ML SUSY  09/02/19  Yes [provider]  HYDROcodone-acetaminophen (NORCO) 10-325 MG tablet Take 1 tablet by mouth every 6 (six) hours as needed. 07/23/20  Yes Hyatt, Max T, DPM  Multiple Vitamin (MULTIVITAMIN) tablet Take 1 tablet by mouth daily.     Yes [provider]  NONFORMULARY OR COMPOUNDED ITEM Apply 1-2 g topically daily. [facility]: Nail laquer 02/12/19  Yes Galaway, Jennifer L, DPM  ondansetron (ZOFRAN) 4 MG tablet Take 1 tablet (4 mg total) by mouth every 8 (eight) hours as needed. 07/23/20  Yes Hyatt, Max T, DPM  polyethylene glycol (MIRALAX / GLYCOLAX) 17 g packet Take 17 g by mouth daily. 06/26/19  Yes Khatri, Hina, PA-C  Propylene Glycol (SYSTANE COMPLETE OP) Apply to eye.   Yes [provider]     Positive ROS: Otherwise negative  All other systems have been reviewed and were otherwise negative with the exception of those mentioned in the HPI and as above.  Physical Exam: Constitutional: Alert, well-appearing, no acute distress Ears: External ears without lesions or tenderness. Ear canals she has small ear canals bilaterally with moderate wax buildup that was cleaned with suction curettes and forceps.  TMs were otherwise clear.. Nasal: External nose without lesions. Clear nasal passages Oral: Oropharynx clear. Neck: No palpable adenopathy or  masses Respiratory: Breathing comfortably  Skin: No facial/neck lesions or rash noted.  Cerumen impaction removal  Date/Time: 08/05/2020 8:46 AM Performed by: Rozetta Nunnery, MD Authorized by: Rozetta Nunnery, MD   Consent:    Consent obtained:  Verbal   Consent given by:  Patient   Risks discussed:  Pain and bleeding Procedure details:    Location:  L ear and R ear   Procedure type: curette, suction and forceps   Post-procedure  details:    Inspection:  TM intact and canal normal   Hearing quality:  Improved   Patient tolerance of procedure:  Tolerated well, no immediate complications Comments:     TMs are clear bilaterally.    Assessment: Bilateral cerumen buildup  Plan: She will follow-up as needed or in 6 months.  Radene Journey, MD

## 2020-08-06 ENCOUNTER — Encounter: Payer: PPO | Admitting: Podiatry

## 2020-08-11 ENCOUNTER — Other Ambulatory Visit: Payer: Self-pay

## 2020-08-11 ENCOUNTER — Encounter: Payer: Self-pay | Admitting: Podiatry

## 2020-08-11 ENCOUNTER — Ambulatory Visit (INDEPENDENT_AMBULATORY_CARE_PROVIDER_SITE_OTHER): Payer: PPO | Admitting: Podiatry

## 2020-08-11 DIAGNOSIS — M67472 Ganglion, left ankle and foot: Secondary | ICD-10-CM

## 2020-08-11 DIAGNOSIS — Z9889 Other specified postprocedural states: Secondary | ICD-10-CM

## 2020-08-11 NOTE — Progress Notes (Signed)
She presents today for postop visit she is 2 weeks status post excision inclusion cyst dorsal aspect left foot overlying the fifth metatarsophalangeal joint.  States that she is feeling fine the stitches have started to itch a little bit.  Objective: Vital signs are stable she is alert and oriented x3 margins are well coapted sutures are intact.  Assessment well-healing surgical foot no signs of infection.  Plan: Removal of the sutures today and allow her to get back to her regular activities and will follow up with her in a couple weeks to make sure she is still doing well.

## 2020-08-18 ENCOUNTER — Telehealth: Payer: Self-pay | Admitting: Pharmacist

## 2020-08-18 NOTE — Chronic Care Management (AMB) (Signed)
I left the patient a message about her upcoming appointment on 08/19/2020 @ 4:00 pm with the clinical pharmacist. She was asked to please have all medication on had to review the pharmacist.

## 2020-08-19 ENCOUNTER — Ambulatory Visit: Payer: PPO

## 2020-08-19 DIAGNOSIS — F17200 Nicotine dependence, unspecified, uncomplicated: Secondary | ICD-10-CM

## 2020-08-19 DIAGNOSIS — J309 Allergic rhinitis, unspecified: Secondary | ICD-10-CM

## 2020-08-19 NOTE — Chronic Care Management (AMB) (Signed)
Chronic Care Management Pharmacy  Name: Debra Woodward  MRN: [id number] DOB: [birth year]  Initial Questions: 1. Have you seen any other providers since your last visit? Yes  2. Any changes in your medicines or health? No   Chief Complaint/ HPI  Debra Woodward,  69 y.o. , female presents for their Follow-Up CCM visit with the clinical pharmacist via telephone due to COVID-19 Pandemic.  PCP : Debra Medin, Woodward  Their chronic conditions include: Tobacco use, Hyperglycemia, Allergic rhinitis, Arthritis   Office Visits: -06/22/20 Debra Neas, LPN: [name]  -06/01/20 Debra Woodward: [name] Prescribed medrol dosepak.   -09/23/2019- Patient presented for office visit with Dr. Shanon Ace, Woodward for annual wellness exam. Patient reported left groin pain after slipping. Patient referred to sports medicine. Labs ordered: CBC, A1c, lipid panel, TSH.  Consult Visit: -08/11/20 Debra Woodward, DPM (podiatry): [name] [name] Removed sutures and allowed her to get back to regular activities. Follow up in a few weeks.   -08/05/20 Debra Overly, Woodward (otolaryngology): Patient presented for cerumen impaction removal. Follow up in 6 months.   -07/30/20 Debra Woodward, DPM (podiatry): [name] [name] Foot redressed today and encouraged patient to stay off as much as possible and to keep it elevated. Follow up in 1 week.  -06/04/20 Debra Woodward, DPM (podiatry): [name] Plan for surgery.    -05/20/20 Debra Woodward, DPM (podiatry): [name] Pain interferes with ambulation. Performed nail debridement.   -04/28/20 Debra Woodward (ophthalmology): Patient presented for eye exam. Unable to access notes.  -04/15/20 Debra Rains, Woodward (dermatology): Patient presented for skin tag removal.   -04/14/20 Debra Woodward (OBGYN): Patient presented for DEXA. Unable to access notes.  -03/18/20 Debra Lowenstein, NP (OBGYN): Patient presented for breast and pelvic exam. Follow up in 1 year.   02/19/2020- Podiatry- Patient presented for office visit with Debra Woodward, [name]   01/31/2020- Otolaryngology- [name], Woodward for bilateral impacted cerumen. Cerumen buildup cleaned in the office. Patient to follow up as needed.   Medications: Outpatient Encounter Medications as of 08/19/2020  Medication Sig  . Ascorbic Acid (VITAMIN C) 500 MG CAPS Take 1 capsule by mouth daily.  . Cholecalciferol (VITAMIN D3) 1.25 MG (50000 UT) CAPS Take 2,000 Units by mouth every other day.   . Multiple Vitamin (MULTIVITAMIN) tablet Take 1 tablet by mouth daily.    . cetirizine (ZYRTEC) 10 MG tablet Take 10 mg by mouth daily. Patient alternates with Allegra  . desonide (DESOWEN) 0.05 % cream APPLY  CREAM TOPICALLY TWICE DAILY TO  ITCHY  AREA  ON  FACE  NOT  IN  THE  EYE  . diclofenac sodium (VOLTAREN) 1 % GEL Apply 2 g topically 4 (four) times daily.   Marland Kitchen docusate sodium (COLACE) 100 MG capsule Take 1 capsule (100 mg total) by mouth every 12 (twelve) hours.  . fexofenadine (ALLEGRA) 60 MG tablet Take 60 mg by mouth daily. Patient alternates with Zyrtec  . fluticasone (FLONASE) 50 MCG/ACT  nasal spray SMARTSIG:2 Spray(s) Both Nares Every Night  . FLUZONE HIGH-DOSE QUADRIVALENT 0.7 ML SUSY   . HYDROcodone-acetaminophen (NORCO) 10-325 MG tablet Take 1 tablet by mouth every 6 (six) hours as needed.  . NONFORMULARY OR COMPOUNDED ITEM Apply 1-2 g topically daily. [facility]: Nail laquer  . ondansetron (ZOFRAN) 4 MG tablet Take 1 tablet (4 mg total) by mouth every 8 (eight) hours as needed.  . polyethylene glycol (MIRALAX / GLYCOLAX) 17 g packet Take 17 g by mouth daily.  Marland Kitchen Propylene Glycol (SYSTANE COMPLETE OP) Apply to eye.   No facility-administered encounter medications on file as of 08/19/2020.    Current Diagnosis/Assessment:  Goals Addressed            This Visit's Progress   . Pharmacy Care Plan       CARE PLAN ENTRY  Current Barriers:  . Chronic Disease Management support, education, and care coordination needs related to  Smoking cessation, Elevated blood sugar, Allergic Rhinitis, Arthritis  Pharmacist Clinical Goal(s):  Marland Kitchen Over next 3 months work on the following goals,  o Smoking cessation - Decrease number of cigarettes o Elevated blood sugars - Work on lifestyle modifications (diet and exercise). o Arthritis . Continue to see improvement in pain level.   Interventions: . Comprehensive medication review performed. . Smoking cessation o Continue chewing sugarless gum or eating baby carrots to minimize number of cigarettes.  o Call 1-800-QUITNOW to discuss options for quitting smoking and resources . Elevated blood glucose . Keeping blood pressure and lipids under goal. We discussed modifying lifestyle, including to participate in moderate physical activity (e.g., walking) at least 150 minutes per week.  . Discussed a Mediterranean eating plan with an emphasis on whole grains, legumes, nuts, fruits, and vegetables and minimal refined and processed foods. . Allergic rhinitis . Continue:  - Alternating between Allergra 60mg , 1 tablet once daily and Zyrtec 10mg , 1 tablet once daily.  - Fluticasone (Flonase) 25mcg/act nasal spray, use 2 sprays into both nostrils every night . Arthritis . Continue:  -  diclofenac 1% gel, apply 2 grams four times daily as needed  Patient Self Care Activities:  . Self administers medications  as prescribed, Calls pharmacy for medication refills, and Calls provider office for new concerns or questions  Please see past updates related to this goal by clicking on the "Past Updates" button in the selected goal            Tobacco Use    Tobacco Status:  Social History   Tobacco Use  Smoking Status Current Every Day Smoker  . Packs/day: 0.25  . Years: 51.00  . Pack years: 12.75  . Types: Cigarettes  . Start date: 1973  Smokeless Tobacco Never Used  Tobacco Comment   4 cigs a day (09/28/17)   Patient endorses 1 pack will last 2-3 days.  In between she chews sugarless gum or eats baby carrots.   We discussed: options for NRT such as patches & gum (she did not like in the past - patches gave too much nicotine and did not like gum flavor) and lozenges (may consider these at next visit)  Plan Continue working with patient on smoking cessation.  Reassess at next follow up.   Hyperglycemia   Recent Relevant Labs: Lab Results  Component Value Date/Time   HGBA1C 6.4 09/23/2019 04:28 PM   HGBA1C 6.5 09/18/2018 11:45 AM    Patient has failed these meds in past: none Patient is currently Continue current medication  management strategy on the following medications:  - lifestyle modifications   We discussed: diet and exercise extensively.  We will focus on managing/monitoring CVD risk factors, including keeping blood pressure and lipids under goal. We discussed modifying lifestyle, including to participate in moderate physical activity (e.g., walking) at least 150 minutes per week. Discussed a Mediterranean eating plan with an emphasis on whole grains, legumes, nuts, fruits, and vegetables and minimal refined and processed foods.   Patient states she does not eat red meat nor fried foods.  Endorses numerous vegetables (carrots, brocolli, squash, zucchini, spinach, collard greens, beans) and seafood, [place] and avoiding white potatoes.   Patient uses stationary bike about  three times per week (about 30 to 45 minutes) and continues to walk every day  Patient reports weight loss down to 168 lbs. She reports baking foods and avoiding frying as much as possible. She does admit to eating cookies every once in a while, but not every day. We discussed making sustainable diet changes.  Plan Continue control with diet and exercise.   Allergic rhinitis   Patient is currently controlled on the following medications:  - fexofenadine (Allegra) 60mg , 1 tablet once daily  - cetirizine (Zyrtec) 10mg , 1 tablet once daily (alternates antihistamines- does not take together)   - fluticasone (Flonase) 6mcg/ act nasal spray, 2 sprays into both nostrils every night   We discussed: alternating between Allegra and Zyrtec  Plan Continue current medications.   Arthritis of both knees   Patient has failed these meds in past: none Patient is currently controlled on the following medications:  - diclofenac 1% gel, apply 2 grams four times daily (uses as needed)   Plan Continue current medications   Vaccines  Reviewed and discussed patient's vaccination history.    Immunization History  Administered Date(s) Administered  . Hep A / Hep B 01/25/2018, 02/27/2018, 08/10/2018  . Influenza Split 08/07/2012, 08/23/2014  . Influenza Whole 08/14/2001, 09/10/2007, 08/16/2010, 08/15/2011  . Influenza, High Dose Seasonal PF 08/23/2017, 08/10/2018, 09/02/2019  . Influenza,inj,Quad PF,6+ Mos 08/16/2016  . Influenza-Unspecified 08/14/2013, 08/14/2017  . PFIZER SARS-COV-2 Vaccination 12/03/2019, 12/25/2019  . Pneumococcal Conjugate-13 09/30/2016  . Pneumococcal Polysaccharide-23 11/15/2011, 09/18/2018  . Pneumococcal-Unspecified 07/15/2016  . Td 03/13/2009  . Tdap 01/25/2018  . Zoster 05/28/2012  . Zoster Recombinat (Shingrix) 03/22/2017, 12/11/2017   Recommended patient receive influenza vaccine in office/at pharmacy.  Medication Management   Pt uses [facility] for all  medications Uses pill box? No - puts all of her medicines on her dresser Pt endorses % compliance  We discussed: Current pharmacy is preferred with insurance plan and patient is satisfied with pharmacy services  Plan  Continue current medication management strategy   Follow up: 3 month phone visit for smoking cessation  Jeni Salles, PharmD Clinical Pharmacist [facility]

## 2020-08-20 ENCOUNTER — Other Ambulatory Visit: Payer: Self-pay

## 2020-08-20 ENCOUNTER — Ambulatory Visit (INDEPENDENT_AMBULATORY_CARE_PROVIDER_SITE_OTHER): Payer: PPO | Admitting: Podiatry

## 2020-08-20 ENCOUNTER — Encounter: Payer: Self-pay | Admitting: Podiatry

## 2020-08-20 DIAGNOSIS — Z9889 Other specified postprocedural states: Secondary | ICD-10-CM

## 2020-08-20 DIAGNOSIS — M67472 Ganglion, left ankle and foot: Secondary | ICD-10-CM

## 2020-08-20 NOTE — Progress Notes (Signed)
She presents today for postop visit date of surgery July 24, 2020 with excision of a ganglion tumor which was actually a inclusion cyst to the distal aspect of the incision previously made for 1/5 met osteotomy.  She denies fever chills nausea vomiting states that she is doing great.  No complications.  Objective: Vital signs are stable she is alert and oriented x3 there is no erythema edema cellulitis drainage or odor is gone on to heal uneventfully there is no pain on palpation.  Assessment: Well-healed excision inclusion cyst.  Plan: Follow-up with me on an as-needed basis.

## 2020-08-21 ENCOUNTER — Encounter: Payer: Self-pay | Admitting: Podiatry

## 2020-08-21 ENCOUNTER — Ambulatory Visit: Payer: PPO | Admitting: Podiatry

## 2020-08-21 DIAGNOSIS — M79674 Pain in right toe(s): Secondary | ICD-10-CM | POA: Diagnosis not present

## 2020-08-21 DIAGNOSIS — B351 Tinea unguium: Secondary | ICD-10-CM

## 2020-08-21 DIAGNOSIS — M79675 Pain in left toe(s): Secondary | ICD-10-CM

## 2020-08-21 NOTE — Progress Notes (Signed)
  Subjective:  Patient ID: Debra Woodward, female    DOB: 1951/06/10,  MRN: [id number]  Debra Woodward presents to clinic today for painful thick toenails that are difficult to trim. Pain interferes with ambulation. Aggravating factors include wearing enclosed shoe gear. Pain is relieved with periodic professional debridement.  69 y.o. female presents with the above complaint.  Reports painfully elongated nails to both feet. She is using topical antifungal solution on toenails from [place]. She states she needs new Rx as old one expired in April.  She is seeing Dr. Milinda Pointer and is s/p excision of inclusion cyst left foot. She voices no new concerns with this today.  Review of Systems: Negative except as noted in the HPI. Past Medical History:  Diagnosis Date  . ALLERGIC RHINITIS 09/14/2006  . Allergy   . Fibroid   . Hx of adenomatous colonic polyps 07/28/2015  . Seasonal allergies    Past Surgical History:  Procedure Laterality Date  . ABDOMINAL HYSTERECTOMY  1996  . BREAST EXCISIONAL BIOPSY Left 1983   No scar seen  . BREAST EXCISIONAL BIOPSY Right 1987   NO scar seen  . BREAST SURGERY Bilateral 1983, 1996   benign  . CARPAL TUNNEL RELEASE Right 2008  . COLONOSCOPY  07/23/2015  . CYSTECTOMY Bilateral 1983, 1988   breast; benign  . KNEE ARTHROSCOPY Right 2010  . TOE SURGERY Bilateral   . TOTAL KNEE ARTHROPLASTY Right 2011   Allergies  Allergen Reactions  . Bee Venom Hives  . Prednisone Other (See Comments)    hallucinations   @SHSOC @ Objective:   Constitutional Debra Woodward is a pleasant 69 y.o. African American female, WD, WN in NAD.  AAO x 3.   Vascular Dorsalis pedis pulses palpable bilaterally. Posterior tibial pulses palpable bilaterally. Capillary refill normal to all digits.  No cyanosis or clubbing noted. Pedal hair growth normal.  Neurologic Normal speech. Oriented to person, place, and time. Epicritic sensation to light touch grossly present  bilaterally. Protective sensation intact with 10 gram monofilament b/l LE.  Dermatologic Pedal skin with normal turgor, texture and tone b/l.  Nails thick, elongated, dystrophic with pain on palpation x 10. Well healed longitudinal surgical scar noted along dorsal aspect of 5th metatarsal. No open wounds.  Orthopedic: Normal joint ROM without pain or crepitus bilaterally. No visible deformities. No bony tenderness.   Radiographs: None Assessment:   1. Pain due to onychomycosis of toenails of both feet    Plan:  Patient was evaluated and treated and all questions answered.  Onychomycosis with pain -Nails palliatively debridement as below -Educated on self-care  Procedure: Nail Debridement Rationale: Pain Type of Debridement: manual, sharp debridement. Instrumentation: Nail nipper, rotary burr. Number of Nails: 10 -Examined patient. -She is s/p excision of inclusion cyst left foot and is pleased with outcome.  -New Rx to be faxed to [facility] for topical antifungal solution. Rx written for nonformulary compounding topical antifungal: [facility]: Antifungal cream - Terbinafine 3%, Fluconazole 2%, Tea Tree Oil 5%, Urea 10%, Ibuprofen 2% in DMSO Suspension #60ml. Apply to the affected nail(s) once daily. -Toenails 1-5 b/l were debrided in length and girth with sterile nail nippers and dremel without iatrogenic bleeding.  -Patient to report any pedal injuries to medical professional immediately. -Patient to continue soft, supportive shoe gear daily. -Patient/POA to call should there be question/concern in the interim.  Return in about 3 months (around 11/21/2020).  Marzetta Board, DPM

## 2020-08-24 ENCOUNTER — Other Ambulatory Visit: Payer: Self-pay

## 2020-08-24 MED ORDER — NONFORMULARY OR COMPOUNDED ITEM: 3 refills | Status: DC

## 2020-08-24 NOTE — Patient Instructions (Addendum)
Hi Debra Woodward,  It was so lovely to speak with you on the phone! As discussed, continue to consider possible options for smoking cessation and try to call 1-800-QUIT-NOW to see if they can provide you with any resources. They at least might be able to help you think about a possible plan in order to quit. I know you have tried other options in the past, but there are still a few more we can try.  Please feel free to reach out to me if you have any questions or need anything before our next touch base!  Best, Maddie  Jeni Salles, PharmD Clinical Pharmacist [facility]    Visit Information  Goals Addressed            This Visit's Progress   . Pharmacy Care Plan       CARE PLAN ENTRY  Current Barriers:  . Chronic Disease Management support, education, and care coordination needs related to  Smoking cessation, Elevated blood sugar, Allergic Rhinitis, Arthritis  Pharmacist Clinical Goal(s):  Marland Kitchen Over next 3 months work on the following goals,  o Smoking cessation - Decrease number of cigarettes o Elevated blood sugars - Work on lifestyle modifications (diet and exercise). o Arthritis . Continue to see improvement in pain level.   Interventions: . Comprehensive medication review performed. . Smoking cessation o Continue chewing sugarless gum or eating baby carrots to minimize number of cigarettes.  o Call 1-800-QUITNOW to discuss options for quitting smoking and resources . Elevated blood glucose . Keeping blood pressure and lipids under goal. We discussed modifying lifestyle, including to participate in moderate physical activity (e.g., walking) at least 150 minutes per week.  . Discussed a Mediterranean eating plan with an emphasis on whole grains, legumes, nuts, fruits, and vegetables and minimal refined and processed foods. . Allergic rhinitis . Continue:  - Alternating between Allergra 60mg , 1 tablet once daily and Zyrtec 10mg , 1 tablet  once daily.  - Fluticasone (Flonase) 23mcg/act nasal spray, use 2 sprays into both nostrils every night . Arthritis . Continue:  -  diclofenac 1% gel, apply 2 grams four times daily as needed  Patient Self Care Activities:  . Self administers medications as prescribed, Calls pharmacy for medication refills, and Calls provider office for new concerns or questions  Please see past updates related to this goal by clicking on the "Past Updates" button in the selected goal         The patient verbalized understanding of instructions provided today and declined a print copy of patient instruction materials.   Telephone follow up appointment with pharmacy team member scheduled for: 3 months   Steps to Quit Smoking Smoking tobacco is the leading cause of preventable death. It can affect almost every organ in the body. Smoking puts you and people around you at risk for many serious, long-lasting (chronic) diseases. Quitting smoking can be hard, but it is one of the best things that you can do for your health. It is never too late to quit. How do I get ready to quit? When you decide to quit smoking, make a plan to help you succeed. Before you quit:  Pick a date to quit. Set a date within the next 2 weeks to give you time to prepare.  Write down the reasons why you are quitting. Keep this list in places where you will see it often.  Tell your family, friends, and co-workers that you are quitting. Their support is important.  Talk  with your doctor about the choices that may help you quit.  Find out if your health insurance will pay for these treatments.  Know the people, places, things, and activities that make you want to smoke (triggers). Avoid them. What first steps can I take to quit smoking?  Throw away all cigarettes at home, at work, and in your car.  Throw away the things that you use when you smoke, such as ashtrays and lighters.  Clean your car. Make sure to empty the  ashtray.  Clean your home, including curtains and carpets. What can I do to help me quit smoking? Talk with your doctor about taking medicines and seeing a counselor at the same time. You are more likely to succeed when you do both.  If you are pregnant or breastfeeding, talk with your doctor about counseling or other ways to quit smoking. Do not take medicine to help you quit smoking unless your doctor tells you to do so. To quit smoking: Quit right away  Quit smoking totally, instead of slowly cutting back on how much you smoke over a period of time.  Go to counseling. You are more likely to quit if you go to counseling sessions regularly. Take medicine You may take medicines to help you quit. Some medicines need a prescription, and some you can buy over-the-counter. Some medicines may contain a drug called nicotine to replace the nicotine in cigarettes. Medicines may:  Help you to stop having the desire to smoke (cravings).  Help to stop the problems that come when you stop smoking (withdrawal symptoms). Your doctor may ask you to use:  Nicotine patches, gum, or lozenges.  Nicotine inhalers or sprays.  Non-nicotine medicine that is taken by mouth. Find resources Find resources and other ways to help you quit smoking and remain smoke-free after you quit. These resources are most helpful when you use them often. They include:  Online chats with a Social worker.  Phone quitlines.  Printed Furniture conservator/restorer.  Support groups or group counseling.  Text messaging programs.  Mobile phone apps. Use apps on your mobile phone or tablet that can help you stick to your quit plan. There are many free apps for mobile phones and tablets as well as websites. Examples include Quit Guide from the [employer] and smokefree.gov  What things can I do to make it easier to quit?   Talk to your family and friends. Ask them to support and encourage you.  Call a phone quitline (1-800-QUIT-NOW), reach out  to support groups, or work with a Social worker.  Ask people who smoke to not smoke around you.  Avoid places that make you want to smoke, such as: ? Bars. ? Parties. ? Smoke-break areas at work.  Spend time with people who do not smoke.  Lower the stress in your life. Stress can make you want to smoke. Try these things to help your stress: ? Getting regular exercise. ? Doing deep-breathing exercises. ? Doing yoga. ? Meditating. ? Doing a body scan. To do this, close your eyes, focus on one area of your body at a time from head to toe. Notice which parts of your body are tense. Try to relax the muscles in those areas. How will I feel when I quit smoking? Day 1 to 3 weeks Within the first 24 hours, you may start to have some problems that come from quitting tobacco. These problems are very bad 2-3 days after you quit, but they do not often last for more  than 2-3 weeks. You may get these symptoms:  Mood swings.  Feeling restless, nervous, angry, or annoyed.  Trouble concentrating.  Dizziness.  Strong desire for high-sugar foods and nicotine.  Weight gain.  Trouble pooping (constipation).  Feeling like you may vomit (nausea).  Coughing or a sore throat.  Changes in how the medicines that you take for other issues work in your body.  Depression.  Trouble sleeping (insomnia). Week 3 and afterward After the first 2-3 weeks of quitting, you may start to notice more positive results, such as:  Better sense of smell and taste.  Less coughing and sore throat.  Slower heart rate.  Lower blood pressure.  Clearer skin.  Better breathing.  Fewer sick days. Quitting smoking can be hard. Do not give up if you fail the first time. Some people need to try a few times before they succeed. Do your best to stick to your quit plan, and talk with your doctor if you have any questions or concerns. Summary  Smoking tobacco is the leading cause of preventable death. Quitting smoking  can be hard, but it is one of the best things that you can do for your health.  When you decide to quit smoking, make a plan to help you succeed.  Quit smoking right away, not slowly over a period of time.  When you start quitting, seek help from your doctor, family, or friends. This information is not intended to replace advice given to you by your health care provider. Make sure you discuss any questions you have with your health care provider. Document Revised: 07/26/2019 Document Reviewed: 01/19/2019 Elsevier Patient Education  [place].

## 2020-08-25 ENCOUNTER — Other Ambulatory Visit: Payer: Self-pay

## 2020-08-25 ENCOUNTER — Encounter: Payer: Self-pay | Admitting: Nurse Practitioner

## 2020-08-25 ENCOUNTER — Ambulatory Visit: Payer: PPO | Admitting: Nurse Practitioner

## 2020-08-25 VITALS — BP 132/84

## 2020-08-25 DIAGNOSIS — N898 Other specified noninflammatory disorders of vagina: Secondary | ICD-10-CM

## 2020-08-25 DIAGNOSIS — L853 Xerosis cutis: Secondary | ICD-10-CM

## 2020-08-25 LAB — WET PREP FOR TRICH, YEAST, CLUE

## 2020-08-25 NOTE — Progress Notes (Signed)
   Acute Office Visit  Subjective:    Patient ID: Debra Woodward, female    DOB: [birth year], 69 y.o.   MRN: [id number]   HPI 69 y.o. presents today for vaginal discharge without odor and itching of pubic area that started a few days ago. Denies vaginal or vulvar itching. She is going out of town in 2 days and wants to make sure she does not have an infection.   Review of Systems  Constitutional: Negative.   Genitourinary: Positive for vaginal discharge. Negative for dysuria, frequency and urgency.  Skin:       Pubic itching       Objective:    Physical Exam Constitutional:      Appearance: Normal appearance.  Genitourinary:    General: Normal vulva.     Vagina: Vaginal discharge (white, thick) present. No erythema.     Uterus: Absent.   Skin:         BP 132/84  Wt Readings from Last 3 Encounters:  06/22/20 183 lb (83 kg)  06/01/20 176 lb (79.8 kg)  03/18/20 182 lb (82.6 kg)   Wet prep negative     Assessment & Plan:   Problem List Items Addressed This Visit    None    Visit Diagnoses    Vaginal discharge    -  Primary   Relevant Orders   WET PREP FOR [place], YEAST, CLUE   Dry skin         Plan: Wet prep unremarkable with normal pelvic exam. Dry skin on upper pubic area. Recommend applying oil-based lubricant such as Vaseline or coconut oil to keep moisturized. She is agreeable to plan.     [name] [facility], 10:53 AM 08/25/2020

## 2020-09-08 ENCOUNTER — Encounter: Payer: PPO | Admitting: Podiatry

## 2020-09-22 NOTE — Progress Notes (Signed)
Chief Complaint  Patient presents with   Annual Exam    HPI: Debra Woodward 69 y.o. comes in today for Preventive Medicare exam/ wellness visit .Since last visit. Second surgery left foot but doing well.  Still teaching sub   A few days per week  No major change in health  Still tobacco about 3 per day carton per month . To get flu vaccine in next week  Family hx of ht  Reluctant to check readings and or go on medications    wihses more natural way  Mom had ministrokes ht passe afe 34 Father  Dm complications  Sib has had ht   Health Maintenance  Topic Date Due   INFLUENZA VACCINE  06/14/2020   MAMMOGRAM  01/14/2022   COLONOSCOPY  10/16/2023   TETANUS/TDAP  01/26/2028   DEXA SCAN  Completed   COVID-19 Vaccine  Completed   Hepatitis C Screening  Completed   PNA vac Low Risk Adult  Completed   Health Maintenance Review LIFESTYLE:  Exercise:    Over 3000 steps per day  Not a lot otherwise  Tobacco/ETS:y Alcohol: n Sugar beverages:n Sleep: 7-8 +  Drug use: no HH:1  Works as sub in schools teaches    Hearing: [place]:  No limitations at present . Last eye check UTD  Safety:  Has smoke detector and wears seat belts.  No firearms. No excess sun exposure. Sees dentist regularly.  Falls: n  Memory: Felt to be good  , no concern from her or her family.  Depression: No anhedonia unusual crying or depressive symptoms  Nutrition: Eats well balanced diet; adequate calcium and vitamin D. No swallowing chewing problems.  Injury: no major injuries in the last six months.  Other healthcare providers:  Reviewed today .Marland Kitchen   Preventive parameters: up-to-date  Reviewed   ADLS:   There are no problems or need for assistance  driving, feeding, obtaining food, dressing, toileting and bathing, managing money using phone. She is independent.   ROS:  GEN/ HEENT: No fever, significant weight changes sweats headaches vision problems hearing changes, CV/ PULM; No chest  pain shortness of breath cough, syncope,edema  change in exercise tolerance. GI /GU: No adominal pain, vomiting, change in bowel habits. No blood in the stool. No significant GU symptoms. SKIN/HEME: ,no acute skin rashes suspicious lesions or bleeding. No lymphadenopathy, nodules, masses.  NEURO/ PSYCH:  No neurologic signs such as weakness numbness. No depression anxiety. IMM/ Allergy: No unusual infections.  Allergy .   REST of 12 system review negative except as per HPI   Past Medical History:  Diagnosis Date   ALLERGIC RHINITIS 09/14/2006   Allergy    Fibroid    Hx of adenomatous colonic polyps 07/28/2015   Seasonal allergies     Family History  Problem Relation Age of Onset   Stroke Mother    Diabetes Mother    Diabetes Father    Hypertension Sister    Esophageal cancer Paternal Grandfather 74   Colon cancer Neg Hx    Stomach cancer Neg Hx    Rectal cancer Neg Hx    Breast cancer Neg Hx    Colon polyps Neg Hx     Social History   Socioeconomic History   Marital status: Single    Spouse name: Not on file   Number of children: Not on file   Years of education: Not on file   Highest education level: Not on file  Occupational History  Not on file  Tobacco Use   Smoking status: Current Every Day Smoker    Packs/day: 0.25    Years: 51.00    Pack years: 12.75    Types: Cigarettes    Start date: 1973   Smokeless tobacco: Never Used   Tobacco comment: 4 cigs a day (09/28/17)  Vaping Use   Vaping Use: Never used  Substance and Sexual Activity   Alcohol use: Yes    Alcohol/week: 2.0 standard drinks    Types: 2 Glasses of wine per week    Comment: social   Drug use: No   Sexual activity: Yes    Birth control/protection: Surgical    Comment: HYST  Other Topics Concern   Not on file  Social History Narrative   Going to [facility] of 1   No pets       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    Social Determinants of Health     Financial Resource Strain: Low Risk    Difficulty of Paying Living Expenses: Not hard at all  Food Insecurity:    Worried About Charity fundraiser in the Last Year: Not on file   [employer] of Food in the Last Year: Not on file  Transportation Needs: No Transportation Needs   Lack of Transportation (Medical): No   Lack of Transportation (Non-Medical): No  Physical Activity: Sufficiently Active   Days of Exercise per Week: 4 days   Minutes of Exercise per Session: 60 min  Stress: No Stress Concern Present   Feeling of Stress : Not at all  Social Connections: Moderately Integrated   Frequency of Communication with Friends and Family: More than three times a week   Frequency of Social Gatherings with Friends and Family: Once a week   Attends Religious Services: More than 4 times per year   Active Member of [employer] or Organizations: Yes   Attends Music therapist: More than 4 times per year   Marital Status: Divorced      EXAM:  BP (!) 150/88    Pulse 80    Temp 98.5 F (36.9 C) (Oral)    Ht 5\' 5"  (1.651 m)    Wt 182 lb 6.4 oz (82.7 kg)    SpO2 99%    BMI 30.35 kg/m   Body mass index is 30.35 kg/m.  Physical Exam: Vital signs reviewed UYQ:IHKV is a well-developed well-nourished alert cooperative   who appears stated age in no acute distress.  HEENT: normocephalic atraumatic , Eyes: PERRL EOM's full, conjunctiva clear, Nares: paten,t no deformity discharge or tenderness., Ears: no deformity EAC's clear TMs with normal landmarks. Mouth masked NECK: supple without masses, thyromegaly or bruits. CHEST/PULM:  Clear to auscultation and percussion breath sounds equal no wheeze , rales or rhonchi. No chest wall deformities or tenderness.Breast: normal by inspection . No dimpling, discharge, masses, tenderness or discharge . CV: PMI is nondisplaced, S1 S2 no gallops, murmurs, rubs. Peripheral pulses are full without delay.No JVD .  ABDOMEN: Bowel sounds normal  nontender  No guard or rebound, no hepato splenomegal no CVA tenderness.   Extremtities:  No clubbing cyanosis or edema, no acute joint swelling or redness no focal atrophy healed scare right lateral foot   NEURO:  Oriented x3, cranial nerves 3-12 appear to be intact, no obvious focal weakness,gait within normal limits no abnormal reflexes or asymmetrical SKIN: No acute rashes normal turgor, color, no bruising or  petechiae. PSYCH: Oriented, good eye contact, no obvious depression anxiety, cognition and judgment appear normal. LN: no cervical axillary inguinal adenopathy No noted deficits in memory, attention, and speech.   Lab Results  Component Value Date   WBC 6.5 09/23/2019   HGB 12.9 09/23/2019   HCT 39.0 09/23/2019   PLT 371.0 09/23/2019   GLUCOSE 102 (H) 09/23/2019   CHOL 161 09/23/2019   TRIG 85.0 09/23/2019   HDL 75.00 09/23/2019   LDLCALC 69 09/23/2019   ALT 44 06/26/2019   AST 27 06/26/2019   NA 138 09/23/2019   K 4.1 09/23/2019   CL 104 09/23/2019   CREATININE 0.67 09/23/2019   BUN 8 09/23/2019   CO2 29 09/23/2019   TSH 2.41 09/23/2019   INR 0.94 09/07/2010   HGBA1C 6.4 09/23/2019    ASSESSMENT AND PLAN:  Discussed the following assessment and plan:  Visit for preventive health examination  Medication management - Plan: BASIC METABOLIC PANEL WITH GFR, CBC with Differential/Platelet, Lipid panel, Hepatic function panel, Hemoglobin A1c, Hemoglobin A1c, Hepatic function panel, Lipid panel, CBC with Differential/Platelet, BASIC METABOLIC PANEL WITH GFR  Hyperglycemia - Plan: BASIC METABOLIC PANEL WITH GFR, CBC with Differential/Platelet, Lipid panel, Hepatic function panel, Hemoglobin A1c, Hemoglobin A1c, Hepatic function panel, Lipid panel, CBC with Differential/Platelet, BASIC METABOLIC PANEL WITH GFR  Elevated blood pressure reading - Plan: BASIC METABOLIC PANEL WITH GFR, CBC with Differential/Platelet, Lipid panel, Hepatic function panel, Hemoglobin A1c,  Hemoglobin A1c, Hepatic function panel, Lipid panel, CBC with Differential/Platelet, BASIC METABOLIC PANEL WITH GFR  Tobacco use - Plan: BASIC METABOLIC PANEL WITH GFR, CBC with Differential/Platelet, Lipid panel, Hepatic function panel, Hemoglobin A1c, Hemoglobin A1c, Hepatic function panel, Lipid panel, CBC with Differential/Platelet, BASIC METABOLIC PANEL WITH GFR  Family history of hypertension - parents sib  Family history of diabetes mellitus - father Disc   Advantage of getting bp down and documenting baseline   And bp goals   please document  Baseline   And let us know   She declines med at this time    Will reassess with readings  Her diet seems healthy fish etc   Is aware of wanting to stop tobacco    Updated labs today  Patient Care Team: Burnis Medin, MD as PCP - General (Internal Medicine) Huel Cote, NP (Inactive) as Nurse Practitioner (Obstetrics and Gynecology) Viona Gilmore, [facility] as Pharmacist (Pharmacist)  Patient Instructions  Continue lifestyle intervention healthy eating and exercise .  BP goal perfect is 120/80 but at least  Below 140/90  Average .  Please bring your blood pressure cuff to an appt  appointment Take blood pressure readings twice a day for 3-7 days and record .     Take 2 -3 readings at each sitting .   Can send in readings  by My Chart.     Before checking your blood pressure make sure: You are seated and quite for 5 min before checking Feet are flat on the floor Siting in chair with your back supported straight up and down Arm resting on table or arm of chair at heart level Bladder is empty You have NOT had caffeine or tobacco within the last 30 min  If elevated most times  In addition to lfie style  We can add medicaiton to reduce risk of stroke and heart attack and heart failure.   Check out DASH eating.   Continue to work on stopping tobacco    Health Maintenance, Female Adopting a healthy  lifestyle and getting preventive  care are important in promoting health and wellness. Ask your health care provider about:  The right schedule for you to have regular tests and exams.  Things you can do on your own to prevent diseases and keep yourself healthy. What should I know about diet, weight, and exercise? Eat a healthy diet   Eat a diet that includes plenty of vegetables, fruits, low-fat dairy products, and lean protein.  Do not eat a lot of foods that are high in solid fats, added sugars, or sodium. Maintain a healthy weight Body mass index (BMI) is used to identify weight problems. It estimates body fat based on height and weight. Your health care provider can help determine your BMI and help you achieve or maintain a healthy weight. Get regular exercise Get regular exercise. This is one of the most important things you can do for your health. Most adults should:  Exercise for at least 150 minutes each week. The exercise should increase your heart rate and make you sweat (moderate-intensity exercise).  Do strengthening exercises at least twice a week. This is in addition to the moderate-intensity exercise.  Spend less time sitting. Even light physical activity can be beneficial. Watch cholesterol and blood lipids Have your blood tested for lipids and cholesterol at 69 years of age, then have this test every 5 years. Have your cholesterol levels checked more often if:  Your lipid or cholesterol levels are high.  You are older than 69 years of age.  You are at high risk for heart disease. What should I know about cancer screening? Depending on your health history and family history, you may need to have cancer screening at various ages. This may include screening for:  Breast cancer.  Cervical cancer.  Colorectal cancer.  Skin cancer.  Lung cancer. What should I know about heart disease, diabetes, and high blood pressure? Blood pressure and heart disease  High blood pressure causes heart disease  and increases the risk of stroke. This is more likely to develop in people who have high blood pressure readings, are of African descent, or are overweight.  Have your blood pressure checked: ? Every 3-5 years if you are 89-58 years of age. ? Every year if you are 27 years old or older. Diabetes Have regular diabetes screenings. This checks your fasting blood sugar level. Have the screening done:  Once every three years after age 29 if you are at a normal weight and have a low risk for diabetes.  More often and at a younger age if you are overweight or have a high risk for diabetes. What should I know about preventing infection? Hepatitis B If you have a higher risk for hepatitis B, you should be screened for this virus. Talk with your health care provider to find out if you are at risk for hepatitis B infection. Hepatitis C Testing is recommended for:  Everyone born from [birth year] through 1965.  Anyone with known risk factors for hepatitis C. Sexually transmitted infections (STIs)  Get screened for STIs, including gonorrhea and chlamydia, if: ? You are sexually active and are younger than 69 years of age. ? You are older than 69 years of age and your health care provider tells you that you are at risk for this type of infection. ? Your sexual activity has changed since you were last screened, and you are at increased risk for chlamydia or gonorrhea. Ask your health care provider if you are at risk.  Ask your health care provider about whether you are at high risk for HIV. Your health care provider may recommend a prescription medicine to help prevent HIV infection. If you choose to take medicine to prevent HIV, you should first get tested for HIV. You should then be tested every 3 months for as long as you are taking the medicine. Pregnancy  If you are about to stop having your period (premenopausal) and you may become pregnant, seek counseling before you get pregnant.  Take 400 to 800  micrograms (mcg) of folic acid every day if you become pregnant.  Ask for birth control (contraception) if you want to prevent pregnancy. Osteoporosis and menopause Osteoporosis is a disease in which the bones lose minerals and strength with aging. This can result in bone fractures. If you are 58 years old or older, or if you are at risk for osteoporosis and fractures, ask your health care provider if you should:  Be screened for bone loss.  Take a calcium or vitamin D supplement to lower your risk of fractures.  Be given hormone replacement therapy (HRT) to treat symptoms of menopause. Follow these instructions at home: Lifestyle  Do not use any products that contain nicotine or tobacco, such as cigarettes, e-cigarettes, and chewing tobacco. If you need help quitting, ask your health care provider.  Do not use street drugs.  Do not share needles.  Ask your health care provider for help if you need support or information about quitting drugs. Alcohol use  Do not drink alcohol if: ? Your health care provider tells you not to drink. ? You are pregnant, may be pregnant, or are planning to become pregnant.  If you drink alcohol: ? Limit how much you use to 0-1 drink a day. ? Limit intake if you are breastfeeding.  Be aware of how much alcohol is in your drink. In the [place] one drink equals one 12 oz bottle of beer (355 mL), one 5 oz glass of wine (148 mL), or one 1 oz glass of hard liquor (44 mL). General instructions  Schedule regular health, dental, and eye exams.  Stay current with your vaccines.  Tell your health care provider if: ? You often feel depressed. ? You have ever been abused or do not feel safe at home. Summary  Adopting a healthy lifestyle and getting preventive care are important in promoting health and wellness.  Follow your health care provider's instructions about healthy diet, exercising, and getting tested or screened for diseases.  Follow your health  care provider's instructions on monitoring your cholesterol and blood pressure. This information is not intended to replace advice given to you by your health care provider. Make sure you discuss any questions you have with your health care provider. Document Revised: 10/24/2018 Document Reviewed: 10/24/2018 Elsevier Patient Education  2020 [place] Ke[place]ia Ground M.D.

## 2020-09-23 ENCOUNTER — Ambulatory Visit (INDEPENDENT_AMBULATORY_CARE_PROVIDER_SITE_OTHER): Payer: PPO | Admitting: Internal Medicine

## 2020-09-23 ENCOUNTER — Encounter: Payer: Self-pay | Admitting: Internal Medicine

## 2020-09-23 ENCOUNTER — Other Ambulatory Visit: Payer: Self-pay

## 2020-09-23 VITALS — BP 150/88 | HR 80 | Temp 98.5°F | Ht 65.0 in | Wt 182.4 lb

## 2020-09-23 DIAGNOSIS — Z8249 Family history of ischemic heart disease and other diseases of the circulatory system: Secondary | ICD-10-CM

## 2020-09-23 DIAGNOSIS — Z720 Tobacco use: Secondary | ICD-10-CM

## 2020-09-23 DIAGNOSIS — R030 Elevated blood-pressure reading, without diagnosis of hypertension: Secondary | ICD-10-CM

## 2020-09-23 DIAGNOSIS — Z79899 Other long term (current) drug therapy: Secondary | ICD-10-CM

## 2020-09-23 DIAGNOSIS — R739 Hyperglycemia, unspecified: Secondary | ICD-10-CM

## 2020-09-23 DIAGNOSIS — Z0000 Encounter for general adult medical examination without abnormal findings: Secondary | ICD-10-CM | POA: Diagnosis not present

## 2020-09-23 DIAGNOSIS — Z833 Family history of diabetes mellitus: Secondary | ICD-10-CM

## 2020-09-23 NOTE — Patient Instructions (Addendum)
Continue lifestyle intervention healthy eating and exercise .  BP goal perfect is 120/80 but at least  Below 140/90  Average .  Please bring your blood pressure cuff to an appt  appointment Take blood pressure readings twice a day for 3-7 days and record .     Take 2 -3 readings at each sitting .   Can send in readings  by My Chart.     Before checking your blood pressure make sure: You are seated and quite for 5 min before checking Feet are flat on the floor Siting in chair with your back supported straight up and down Arm resting on table or arm of chair at heart level Bladder is empty You have NOT had caffeine or tobacco within the last 30 min  If elevated most times  In addition to lfie style  We can add medicaiton to reduce risk of stroke and heart attack and heart failure.   Check out DASH eating.   Continue to work on stopping tobacco    Health Maintenance, Female Adopting a healthy lifestyle and getting preventive care are important in promoting health and wellness. Ask your health care provider about:  The right schedule for you to have regular tests and exams.  Things you can do on your own to prevent diseases and keep yourself healthy. What should I know about diet, weight, and exercise? Eat a healthy diet   Eat a diet that includes plenty of vegetables, fruits, low-fat dairy products, and lean protein.  Do not eat a lot of foods that are high in solid fats, added sugars, or sodium. Maintain a healthy weight Body mass index (BMI) is used to identify weight problems. It estimates body fat based on height and weight. Your health care provider can help determine your BMI and help you achieve or maintain a healthy weight. Get regular exercise Get regular exercise. This is one of the most important things you can do for your health. Most adults should:  Exercise for at least 150 minutes each week. The exercise should increase your heart rate and make you sweat  (moderate-intensity exercise).  Do strengthening exercises at least twice a week. This is in addition to the moderate-intensity exercise.  Spend less time sitting. Even light physical activity can be beneficial. Watch cholesterol and blood lipids Have your blood tested for lipids and cholesterol at 69 years of age, then have this test every 5 years. Have your cholesterol levels checked more often if:  Your lipid or cholesterol levels are high.  You are older than 69 years of age.  You are at high risk for heart disease. What should I know about cancer screening? Depending on your health history and family history, you may need to have cancer screening at various ages. This may include screening for:  Breast cancer.  Cervical cancer.  Colorectal cancer.  Skin cancer.  Lung cancer. What should I know about heart disease, diabetes, and high blood pressure? Blood pressure and heart disease  High blood pressure causes heart disease and increases the risk of stroke. This is more likely to develop in people who have high blood pressure readings, are of African descent, or are overweight.  Have your blood pressure checked: ? Every 3-5 years if you are 71-85 years of age. ? Every year if you are 61 years old or older. Diabetes Have regular diabetes screenings. This checks your fasting blood sugar level. Have the screening done:  Once every three years after age 95  if you are at a normal weight and have a low risk for diabetes.  More often and at a younger age if you are overweight or have a high risk for diabetes. What should I know about preventing infection? Hepatitis B If you have a higher risk for hepatitis B, you should be screened for this virus. Talk with your health care provider to find out if you are at risk for hepatitis B infection. Hepatitis C Testing is recommended for:  Everyone born from [birth year] through 1965.  Anyone with known risk factors for hepatitis  C. Sexually transmitted infections (STIs)  Get screened for STIs, including gonorrhea and chlamydia, if: ? You are sexually active and are younger than 69 years of age. ? You are older than 69 years of age and your health care provider tells you that you are at risk for this type of infection. ? Your sexual activity has changed since you were last screened, and you are at increased risk for chlamydia or gonorrhea. Ask your health care provider if you are at risk.  Ask your health care provider about whether you are at high risk for HIV. Your health care provider may recommend a prescription medicine to help prevent HIV infection. If you choose to take medicine to prevent HIV, you should first get tested for HIV. You should then be tested every 3 months for as long as you are taking the medicine. Pregnancy  If you are about to stop having your period (premenopausal) and you may become pregnant, seek counseling before you get pregnant.  Take 400 to 800 micrograms (mcg) of folic acid every day if you become pregnant.  Ask for birth control (contraception) if you want to prevent pregnancy. Osteoporosis and menopause Osteoporosis is a disease in which the bones lose minerals and strength with aging. This can result in bone fractures. If you are 47 years old or older, or if you are at risk for osteoporosis and fractures, ask your health care provider if you should:  Be screened for bone loss.  Take a calcium or vitamin D supplement to lower your risk of fractures.  Be given hormone replacement therapy (HRT) to treat symptoms of menopause. Follow these instructions at home: Lifestyle  Do not use any products that contain nicotine or tobacco, such as cigarettes, e-cigarettes, and chewing tobacco. If you need help quitting, ask your health care provider.  Do not use street drugs.  Do not share needles.  Ask your health care provider for help if you need support or information about quitting  drugs. Alcohol use  Do not drink alcohol if: ? Your health care provider tells you not to drink. ? You are pregnant, may be pregnant, or are planning to become pregnant.  If you drink alcohol: ? Limit how much you use to 0-1 drink a day. ? Limit intake if you are breastfeeding.  Be aware of how much alcohol is in your drink. In the [place] one drink equals one 12 oz bottle of beer (355 mL), one 5 oz glass of wine (148 mL), or one 1 oz glass of hard liquor (44 mL). General instructions  Schedule regular health, dental, and eye exams.  Stay current with your vaccines.  Tell your health care provider if: ? You often feel depressed. ? You have ever been abused or do not feel safe at home. Summary  Adopting a healthy lifestyle and getting preventive care are important in promoting health and wellness.  Follow your health care  provider's instructions about healthy diet, exercising, and getting tested or screened for diseases.  Follow your health care provider's instructions on monitoring your cholesterol and blood pressure. This information is not intended to replace advice given to you by your health care provider. Make sure you discuss any questions you have with your health care provider. Document Revised: 10/24/2018 Document Reviewed: 10/24/2018 Elsevier Patient Education  2020 [employer].

## 2020-09-24 LAB — CBC WITH DIFFERENTIAL/PLATELET
Absolute Monocytes: 655 cells/uL (ref 200–950)
Basophils Absolute: 22 cells/uL (ref 0–200)
Basophils Relative: 0.4 %
Eosinophils Absolute: 151 cells/uL (ref 15–500)
Eosinophils Relative: 2.7 %
HCT: 41.1 % (ref 35.0–45.0)
Hemoglobin: 14.3 g/dL (ref 11.7–15.5)
Lymphs Abs: 1826 cells/uL (ref 850–3900)
MCH: 32.8 pg (ref 27.0–33.0)
MCHC: 34.8 g/dL (ref 32.0–36.0)
MCV: 94.3 fL (ref 80.0–100.0)
MPV: 10.4 fL (ref 7.5–12.5)
Monocytes Relative: 11.7 %
Neutro Abs: 2946 cells/uL (ref 1500–7800)
Neutrophils Relative %: 52.6 %
Platelets: 408 10*3/uL — ABNORMAL HIGH (ref 140–400)
RBC: 4.36 10*6/uL (ref 3.80–5.10)
RDW: 13 % (ref 11.0–15.0)
Total Lymphocyte: 32.6 %
WBC: 5.6 10*3/uL (ref 3.8–10.8)

## 2020-09-24 LAB — LIPID PANEL
Cholesterol: 153 mg/dL (ref <200)
HDL: 67 mg/dL (ref >=50)
LDL Cholesterol (Calc): 70 mg/dL (calc)
Non-HDL Cholesterol (Calc): 86 mg/dL (calc) (ref <130)
Total CHOL/HDL Ratio: 2.3 (calc) (ref <5.0)
Triglycerides: 81 mg/dL (ref <150)

## 2020-09-24 LAB — BASIC METABOLIC PANEL WITH GFR
BUN: 9 mg/dL (ref 7–25)
CO2: 27 mmol/L (ref 20–32)
Calcium: 10.4 mg/dL (ref 8.6–10.4)
Chloride: 106 mmol/L (ref 98–110)
Creat: 0.74 mg/dL (ref 0.50–0.99)
GFR, Est African American: 96 mL/min/{1.73_m2} (ref >=60)
GFR, Est Non African American: 83 mL/min/{1.73_m2} (ref >=60)
Glucose, Bld: 111 mg/dL — ABNORMAL HIGH (ref 65–99)
Potassium: 4.6 mmol/L (ref 3.5–5.3)
Sodium: 140 mmol/L (ref 135–146)

## 2020-09-24 LAB — HEPATIC FUNCTION PANEL
AG Ratio: 1.5 (calc) (ref 1.0–2.5)
ALT: 13 U/L (ref 6–29)
AST: 16 U/L (ref 10–35)
Albumin: 4.2 g/dL (ref 3.6–5.1)
Alkaline phosphatase (APISO): 94 U/L (ref 37–153)
Bilirubin, Direct: 0.1 mg/dL (ref 0.0–0.2)
Globulin: 2.8 g/dL (calc) (ref 1.9–3.7)
Indirect Bilirubin: 0.5 mg/dL (calc) (ref 0.2–1.2)
Total Bilirubin: 0.6 mg/dL (ref 0.2–1.2)
Total Protein: 7 g/dL (ref 6.1–8.1)

## 2020-09-24 LAB — HEMOGLOBIN A1C
Hgb A1c MFr Bld: 6.2 % of total Hgb — ABNORMAL HIGH (ref <5.7)
Mean Plasma Glucose: 131 (calc)
eAG (mmol/L): 7.3 (calc)

## 2020-09-24 NOTE — Progress Notes (Signed)
Blood sugar borderline elevated   No diabetes   Otherwise labs stable or  normal

## 2020-10-27 DIAGNOSIS — H40023 Open angle with borderline findings, high risk, bilateral: Secondary | ICD-10-CM | POA: Diagnosis not present

## 2020-10-27 DIAGNOSIS — H40033 Anatomical narrow angle, bilateral: Secondary | ICD-10-CM | POA: Diagnosis not present

## 2020-10-27 DIAGNOSIS — H2513 Age-related nuclear cataract, bilateral: Secondary | ICD-10-CM | POA: Diagnosis not present

## 2020-10-27 DIAGNOSIS — H34212 Partial retinal artery occlusion, left eye: Secondary | ICD-10-CM | POA: Diagnosis not present

## 2020-10-27 DIAGNOSIS — H524 Presbyopia: Secondary | ICD-10-CM | POA: Diagnosis not present

## 2020-11-18 ENCOUNTER — Ambulatory Visit: Payer: PPO | Admitting: Pharmacist

## 2020-11-18 DIAGNOSIS — Z720 Tobacco use: Secondary | ICD-10-CM

## 2020-11-18 DIAGNOSIS — J309 Allergic rhinitis, unspecified: Secondary | ICD-10-CM

## 2020-11-18 NOTE — Chronic Care Management (AMB) (Signed)
Chronic Care Management Pharmacy  Name: Debra Woodward  MRN: [id number] DOB: 12-09-1950  Initial Questions: 1. Have you seen any other providers since your last visit? Yes  2. Any changes in your medicines or health? No   Chief Complaint/ HPI  Debra Woodward,  70 y.o. , female presents for their Follow-Up CCM visit with the clinical pharmacist via telephone due to COVID-19 Pandemic.  PCP : Madelin Headings, MD  Their chronic conditions include: Tobacco use, Hyperglycemia, Allergic rhinitis, Arthritis   Office Visits: 09/23/20 Berniece Andreas, MD: Patient presented for annual exam. A1c decreased from last visit but still elevated. BP elevated in office.  -06/22/20 Theodora Blow, LPN: [name]  -06/01/20 Berniece Andreas, MD: [name] Prescribed medrol dosepak.   Consult Visit: 08/25/20 Wyline Beady, NP (OBGYN): Patient presented for vaginal discharge. Wet prep was unremarkable with normal pelvic exam.  08/21/20 Geralynn Rile, DPM (podiatry): Patient presented for toe nail removal.  08/20/20 Crissie Figures, [place] (podiatry): [name] No changes made.  -08/11/20 Max Al Corpus, DPM (podiatry): [name] [name] Removed sutures and allowed her to get back to regular activities. Follow up in a few weeks.   -08/05/20 Dillard Cannon, MD (otolaryngology): Patient presented for cerumen impaction removal. Follow up in 6 months.   -07/30/20 Ernestene Kiel, DPM (podiatry): [name] [name] Foot redressed today and encouraged patient to stay off as much as possible and to  keep it elevated. Follow up in 1 week.  -06/04/20 Max Al Corpus, DPM (podiatry): [name] Plan for surgery.    -05/20/20 Geralynn Rile, DPM (podiatry): Patient presented for painful thick toenails that are difficult to trim. Pain interferes with ambulation. Performed nail debridement.   -04/28/20 Wynell Balloon (ophthalmology): Patient presented for eye exam. Unable to access notes.  -04/15/20 Lisette Grinder, MD (dermatology): Patient presented for skin tag removal.   -04/14/20 Genia Del (OBGYN): Patient presented for DEXA. Unable to access notes.  Medications: Outpatient Encounter Medications as of 08/19/2020  Medication Sig  . Ascorbic Acid (VITAMIN C) 500 MG CAPS Take 1 capsule by mouth daily.  . Cholecalciferol (VITAMIN D3) 1.25 MG (50000 UT) CAPS Take 2,000 Units by mouth every other day.   . Multiple Vitamin (MULTIVITAMIN) tablet Take 1 tablet by mouth daily.    . cetirizine (ZYRTEC) 10 MG tablet Take 10 mg by mouth daily. Patient alternates with Allegra  . desonide (DESOWEN) 0.05 % cream APPLY  CREAM TOPICALLY TWICE DAILY TO  ITCHY  AREA  ON  FACE  NOT  IN  THE  EYE  . diclofenac sodium (VOLTAREN) 1 % GEL Apply 2 g topically 4 (four) times daily.   Marland Kitchen docusate sodium (COLACE) 100 MG capsule Take 1 capsule (100 mg total) by mouth every 12 (twelve) hours.  . fexofenadine (ALLEGRA) 60 MG tablet Take 60 mg by mouth daily. Patient alternates with Zyrtec  . fluticasone (FLONASE) 50 MCG/ACT nasal spray SMARTSIG:2 Spray(s) Both Nares Every Night  . FLUZONE HIGH-DOSE QUADRIVALENT 0.7 ML SUSY   . HYDROcodone-acetaminophen (NORCO) 10-325 MG tablet Take 1 tablet  by mouth every 6 (six) hours as needed.  . NONFORMULARY OR COMPOUNDED ITEM Apply 1-2 g topically daily. [facility]: Nail laquer  . ondansetron (ZOFRAN) 4 MG tablet Take 1 tablet (4 mg total) by mouth every 8 (eight) hours as needed.  . polyethylene glycol (MIRALAX / GLYCOLAX) 17  g packet Take 17 g by mouth daily.  Marland Kitchen Propylene Glycol (SYSTANE COMPLETE OP) Apply to eye.   No facility-administered encounter medications on file as of 08/19/2020.     Current Diagnosis/Assessment:  Goals Addressed            This Visit's Progress   . Pharmacy Care Plan       CARE PLAN ENTRY  Current Barriers:  . Chronic Disease Management support, education, and care coordination needs related to  Smoking cessation, Elevated blood sugar, Allergic Rhinitis, Arthritis  Pharmacist Clinical Goal(s):  Marland Kitchen Over next 3 months work on the following goals,  o Smoking cessation - Decrease number of cigarettes o Elevated blood sugars - Work on lifestyle modifications (diet and exercise). o Arthritis . Continue to see improvement in pain level.   Interventions: . Comprehensive medication review performed. . Smoking cessation o Continue chewing sugarless gum or eating baby carrots to minimize number of cigarettes.  o Call 1-800-QUITNOW to discuss options for quitting smoking and resources o Look into cost of nicotine lozenges and if your insurance will cover it . Elevated blood glucose . Keeping blood pressure and lipids under goal. We discussed modifying lifestyle, including to participate in moderate physical activity (e.g., walking) at least 150 minutes per week.  . Discussed a Mediterranean eating plan with an emphasis on whole grains, legumes, nuts, fruits, and vegetables and minimal refined and processed foods. . Allergic rhinitis . Continue:  - Alternating between Allergra 60mg , 1 tablet once daily and Zyrtec 10mg , 1 tablet once daily.  - Fluticasone (Flonase) 31mcg/act nasal spray, use 2 sprays into both nostrils every night . Arthritis . Continue:  -  diclofenac 1% gel, apply 2 grams four times daily as needed  Patient Self Care Activities:  . Self administers medications as prescribed, Calls pharmacy for medication refills, and Calls provider office for new concerns or  questions  Please see past updates related to this goal by clicking on the "Past Updates" button in the selected goal           Elevated BP   BP goal is:  <140/90  Office blood pressures are  BP Readings from Last 3 Encounters:  09/23/20 (!) 150/88  08/25/20 132/84  06/22/20 120/78   Patient checks BP at home weekly Patient home BP readings are ranging: n/a  Patient has failed these meds in the past: none Patient is currently controlled on the following medications:  . No medications   We discussed diet and exercise extensively -DASH eating plan recommendations: . Emphasizes vegetables, fruits, and whole-grains . Includes fat-free or low-fat dairy products, fish, poultry, beans, nuts, and vegetable oils . Limits foods that are high in saturated fat. These foods include fatty meats, full-fat dairy products, and tropical oils such as coconut, palm kernel, and palm oils. . Limits sugar-sweetened beverages and sweets . Limiting sodium intake to < 1500 mg/day -Diet: does not cook with salt; doesn't eat red meat, usually seafood or chicken or [place] - no frying; eats lots of vegetables, some fruit (apples and bananas)   Plan  Continue control with diet and exercise    Tobacco Use    Tobacco Status:  Social History   Tobacco Use  Smoking Status Current Every Day Smoker  . Packs/day: 0.25  . Years: 51.00  . Pack years: 12.75  . Types: Cigarettes  . Start date: 1973  Smokeless Tobacco Never Used  Tobacco Comment   4 cigs a day (09/28/17)   Patient endorses 1 pack will last at least 3 days. She has her first cigarette with breakfast and it is a habit. In between she chews sugarless gum or eats baby carrots.   We discussed: options for NRT such as patches & gum (she did not like in the past - patches gave too much nicotine and did not like gum flavor) and lozenges   Plan Patient will call insurance or pharmacy to determine the price of nicotine lozenges 2 mg to  try and will reach out if she would rather pursue a prescription.  Pre-Diabetes   Recent Relevant Labs: Lab Results  Component Value Date/Time   HGBA1C 6.2 (H) 09/23/2020 12:06 PM   HGBA1C 6.4 09/23/2019 04:28 PM    Patient has failed these meds in past: none Patient is currently Continue current medication management strategy on the following medications:  - lifestyle modifications   We discussed: diet and exercise extensively.  --Following the healthy plate method which includes: . Fill half of your plate with nonstarchy vegetables, such as spinach, broccoli, carrots and tomatoes. Venida Jarvis a quarter of your plate with a protein, such as tuna, lean pork or chicken. Venida Jarvis the last quarter with a whole-grain item, such as brown rice, or a starchy vegetable, such as green peas or potatoes. . Include "good" fats such as nuts or avocados in small amounts.   Plan Continue control with diet and exercise.   Allergic rhinitis   Patient is currently controlled on the following medications:  - fexofenadine (Allegra) 60mg , 1 tablet once daily  - cetirizine (Zyrtec) 10mg , 1 tablet once daily (alternates antihistamines- does not take together)   - fluticasone (Flonase) 88mcg/ act nasal spray, 2 sprays into both nostrils every night   We discussed: alternating between Allegra and Zyrtec  Plan Continue current medications.   Arthritis of both knees   Patient has failed these meds in past: none Patient is currently controlled on the following medications:  - diclofenac 1% gel, apply 2 grams four times daily (uses as needed)   Plan Continue current medications   Vaccines  Reviewed and discussed patient's vaccination history.    Immunization History  Administered Date(s) Administered  . Hep A / Hep B 01/25/2018, 02/27/2018, 08/10/2018  . Influenza Split 08/07/2012, 08/23/2014  . Influenza Whole 08/14/2001, 09/10/2007, 08/16/2010, 08/15/2011  . Influenza, High Dose Seasonal PF  08/23/2017, 08/10/2018, 09/02/2019  . Influenza,inj,Quad PF,6+ Mos 08/16/2016  . Influenza-Unspecified 08/14/2013, 08/14/2017  . PFIZER SARS-COV-2 Vaccination 12/03/2019, 12/25/2019  . Pneumococcal Conjugate-13 09/30/2016  . Pneumococcal Polysaccharide-23 11/15/2011, 09/18/2018  . Pneumococcal-Unspecified 07/15/2016  . Td 03/13/2009  . Tdap 01/25/2018  . Zoster 05/28/2012  . Zoster Recombinat (Shingrix) 03/22/2017, 12/11/2017   Patient reported receiving influenza vaccine at pharmacy.   Medication Management   Pt uses [facility] for all medications Uses pill box? No - puts all of her medicines on her dresser Pt endorses 90% compliance  We discussed: Current pharmacy is preferred with insurance plan and patient is satisfied with pharmacy services  Plan  Continue current medication management strategy   Follow up: 2-3 month phone visit for smoking cessation  Jeni Salles, PharmD Clinical Pharmacist [place] [facility] [address]  [phone]  

## 2020-11-23 ENCOUNTER — Other Ambulatory Visit: Payer: Self-pay

## 2020-11-23 ENCOUNTER — Ambulatory Visit: Payer: PPO | Admitting: Nurse Practitioner

## 2020-11-23 ENCOUNTER — Encounter: Payer: Self-pay | Admitting: Nurse Practitioner

## 2020-11-23 VITALS — BP 118/78 | HR 70 | Resp 16 | Wt 182.0 lb

## 2020-11-23 DIAGNOSIS — N9089 Other specified noninflammatory disorders of vulva and perineum: Secondary | ICD-10-CM

## 2020-11-23 DIAGNOSIS — N898 Other specified noninflammatory disorders of vagina: Secondary | ICD-10-CM

## 2020-11-23 LAB — WET PREP FOR TRICH, YEAST, CLUE

## 2020-11-23 MED ORDER — TRIAMCINOLONE ACETONIDE 0.1 % EX OINT: TOPICAL_OINTMENT | CUTANEOUS | 1 refills | Status: AC

## 2020-11-23 NOTE — Patient Instructions (Addendum)
Hi Debra Woodward,  It was so lovely to get to speak with you on the phone again! As we discussed, continue working on cutting back with your smoking as this can be contributing to your elevated blood pressures. Please look into the cost of the nicotine lozenges and see if your insurance will cover them. If so, definitely let me know and I can see if Dr. Fabian Sharp will write you a prescription!  Please give me a call if you have any questions or need anything before our follow up!  Best, Maddie  Gaylord Shih, PharmD [facility] Clinical Pharmacist [facility] [place] [phone]   Visit Information  Goals Addressed            This Visit's Progress   . Pharmacy Care Plan       CARE PLAN ENTRY  Current Barriers:  . Chronic Disease Management support, education, and care coordination needs related to  Smoking cessation, Elevated blood sugar, Allergic Rhinitis, Arthritis  Pharmacist Clinical Goal(s):  Marland Kitchen Over next 3 months work on the following goals,  o Smoking cessation - Decrease number of cigarettes o Elevated blood sugars - Work on lifestyle modifications (diet and exercise). o Arthritis . Continue to see improvement in pain level.   Interventions: . Comprehensive medication review performed. . Smoking cessation o Continue chewing sugarless gum or eating baby carrots to minimize number of cigarettes.  o Call 1-800-QUITNOW to discuss options for quitting smoking and resources o Look into cost of nicotine lozenges and if your insurance will cover it . Elevated blood glucose . Keeping blood pressure and lipids under goal. We discussed modifying lifestyle, including to participate in moderate physical activity (e.g., walking) at least 150 minutes per week.  . Discussed a Mediterranean eating plan with an emphasis on whole grains, legumes, nuts, fruits, and vegetables and minimal refined and processed foods. . Allergic rhinitis . Continue:  - Alternating between Allergra  60mg , 1 tablet once daily and Zyrtec 10mg , 1 tablet once daily.  - Fluticasone (Flonase) 37mcg/act nasal spray, use 2 sprays into both nostrils every night . Arthritis . Continue:  -  diclofenac 1% gel, apply 2 grams four times daily as needed  Patient Self Care Activities:  . Self administers medications as prescribed, Calls pharmacy for medication refills, and Calls provider office for new concerns or questions  Please see past updates related to this goal by clicking on the "Past Updates" button in the selected goal         The patient verbalized understanding of instructions, educational materials, and care plan provided today and declined offer to receive copy of patient instructions, educational materials, and care plan.   Telephone follow up appointment with pharmacy team member scheduled for: 2-3 months  , [facility]  Steps to Quit Smoking Smoking tobacco is the leading cause of preventable death. It can affect almost every organ in the body. Smoking puts you and those around you at risk for developing many serious chronic diseases. Quitting smoking can be difficult, but it is one of the best things that you can do for your health. It is never too late to quit. How do I get ready to quit? When you decide to quit smoking, create a plan to help you succeed. Before you quit:  Pick a date to quit. Set a date within the next 2 weeks to give you time to prepare.  Write down the reasons why you are quitting. Keep this list in places where you will  see it often.  Tell your family, friends, and co-workers that you are quitting. Support from your loved ones can make quitting easier.  Talk with your health care provider about your options for quitting smoking.  Find out what treatment options are covered by your health insurance.  Identify people, places, things, and activities that make you want to smoke (triggers). Avoid them. What first steps can I take to quit  smoking?  Throw away all cigarettes at home, at work, and in your car.  Throw away smoking accessories, such as Scientist, research (medical).  Clean your car. Make sure to empty the ashtray.  Clean your home, including curtains and carpets. What strategies can I use to quit smoking? Talk with your health care provider about combining strategies, such as taking medicines while you are also receiving in-person counseling. Using these two strategies together makes you more likely to succeed in quitting than if you used either strategy on its own.  If you are pregnant or breastfeeding, talk with your health care provider about finding counseling or other support strategies to quit smoking. Do not take medicine to help you quit smoking unless your health care provider tells you to do so. To quit smoking: Quit right away  Quit smoking completely, instead of gradually reducing how much you smoke over a period of time. Research shows that stopping smoking right away is more successful than gradually quitting.  Attend in-person counseling to help you build problem-solving skills. You are more likely to succeed in quitting if you attend counseling sessions regularly. Even short sessions of 10 minutes can be effective. Take medicine You may take medicines to help you quit smoking. Some medicines require a prescription and some you can purchase over-the-counter. Medicines may have nicotine in them to replace the nicotine in cigarettes. Medicines may:  Help to stop cravings.  Help to relieve withdrawal symptoms. Your health care provider may recommend:  Nicotine patches, gum, or lozenges.  Nicotine inhalers or sprays.  Non-nicotine medicine that is taken by mouth. Find resources Find resources and support systems that can help you to quit smoking and remain smoke-free after you quit. These resources are most helpful when you use them often. They include:  Online chats with a Social worker.  Telephone  quitlines.  Printed Furniture conservator/restorer.  Support groups or group counseling.  Text messaging programs.  Mobile phone apps or applications. Use apps that can help you stick to your quit plan by providing reminders, tips, and encouragement. There are many free apps for mobile devices as well as websites. Examples include Quit Guide from the [employer] and smokefree.gov   What things can I do to make it easier to quit?  Reach out to your family and friends for support and encouragement. Call telephone quitlines (1-800-QUIT-NOW), reach out to support groups, or work with a counselor for support.  Ask people who smoke to avoid smoking around you.  Avoid places that trigger you to smoke, such as bars, parties, or smoke-break areas at work.  Spend time with people who do not smoke.  Lessen the stress in your life. Stress can be a smoking trigger for some people. To lessen stress, try: ? Exercising regularly. ? Doing deep-breathing exercises. ? Doing yoga. ? Meditating. ? Performing a body scan. This involves closing your eyes, scanning your body from head to toe, and noticing which parts of your body are particularly tense. Try to relax the muscles in those areas.   How will I feel when I  quit smoking? Day 1 to 3 weeks Within the first 24 hours of quitting smoking, you may start to feel withdrawal symptoms. These symptoms are usually most noticeable 2-3 days after quitting, but they usually do not last for more than 2-3 weeks. You may experience these symptoms:  Mood swings.  Restlessness, anxiety, or irritability.  Trouble concentrating.  Dizziness.  Strong cravings for sugary foods and nicotine.  Mild weight gain.  Constipation.  Nausea.  Coughing or a sore throat.  Changes in how the medicines that you take for unrelated issues work in your body.  Depression.  Trouble sleeping (insomnia). Week 3 and afterward After the first 2-3 weeks of quitting, you may start to notice  more positive results, such as:  Improved sense of smell and taste.  Decreased coughing and sore throat.  Slower heart rate.  Lower blood pressure.  Clearer skin.  The ability to breathe more easily.  Fewer sick days. Quitting smoking can be very challenging. Do not get discouraged if you are not successful the first time. Some people need to make many attempts to quit before they achieve long-term success. Do your best to stick to your quit plan, and talk with your health care provider if you have any questions or concerns. Summary  Smoking tobacco is the leading cause of preventable death. Quitting smoking is one of the best things that you can do for your health.  When you decide to quit smoking, create a plan to help you succeed.  Quit smoking right away, not slowly over a period of time.  When you start quitting, seek help from your health care provider, family, or friends. This information is not intended to replace advice given to you by your health care provider. Make sure you discuss any questions you have with your health care provider. Document Revised: 07/26/2019 Document Reviewed: 01/19/2019 Elsevier Patient Education  [place].

## 2020-11-23 NOTE — Progress Notes (Signed)
GYNECOLOGY  VISIT  CC:   C/o vaginal infection  HPI: 70 y.o. [id number] Single Black or African American female here for vaginal itching, discharge & with odor.   Has noticed above symptoms x 10 days. Sexually active but same partner, no concern of infidelity Used a fruity massage gel and wonders if that could have caused her the problem Has never used estrogen, denies vaginal dryness  GYNECOLOGIC HISTORY: No LMP recorded. Patient has had a hysterectomy. Contraception: hysterectomy Menopausal hormone therapy: none  Patient Active Problem List   Diagnosis Date Noted  . Arthritis of both knees 11/15/2017  . Aftercare 11/15/2017  . Hx of adenomatous colonic polyps 07/28/2015  . Degenerative disc disease, lumbar sacral 11/13/2012  . Elevated blood pressure reading 11/13/2012  . Allergic rhinitis 09/14/2006    Past Medical History:  Diagnosis Date  . ALLERGIC RHINITIS 09/14/2006  . Allergy   . Fibroid   . Hx of adenomatous colonic polyps 07/28/2015  . Seasonal allergies     Past Surgical History:  Procedure Laterality Date  . ABDOMINAL HYSTERECTOMY  1996  . BREAST EXCISIONAL BIOPSY Left 1983   No scar seen  . BREAST EXCISIONAL BIOPSY Right 1987   NO scar seen  . BREAST SURGERY Bilateral 1983, 1996   benign  . CARPAL TUNNEL RELEASE Right 2008  . COLONOSCOPY  07/23/2015  . CYSTECTOMY Bilateral 1983, 1988   breast; benign  . KNEE ARTHROSCOPY Right 2010  . TOE SURGERY Bilateral   . TOTAL KNEE ARTHROPLASTY Right 2011    MEDS:   Current Outpatient Medications on File Prior to Visit  Medication Sig Dispense Refill  . Ascorbic Acid (VITAMIN C) 500 MG CAPS Take 1 capsule by mouth daily.    . cetirizine (ZYRTEC) 10 MG tablet Take 10 mg by mouth daily. Patient alternates with Allegra    . docusate sodium (COLACE) 100 MG capsule Take 1 capsule (100 mg total) by mouth every 12 (twelve) hours. 30 capsule 0  . fexofenadine (ALLEGRA) 60 MG tablet Take 60 mg by mouth daily. Patient  alternates with Zyrtec    . fluticasone (FLONASE) 50 MCG/ACT nasal spray SMARTSIG:2 Spray(s) Both Nares Every Night    . Glycerin-Polysorbate 80 (REFRESH DRY EYE THERAPY OP) Apply to eye.    . Multiple Vitamin (MULTIVITAMIN) tablet Take 1 tablet by mouth daily.    . NONFORMULARY OR COMPOUNDED ITEM Apply 1-2 g topically daily. [facility]: Nail laquer 120 each 3  . NONFORMULARY OR COMPOUNDED ITEM Antifungal solution: Terbinafine 3%, Fluconazole 2%, Tea Tree Oil 5%, Urea 10%, Ibuprofen 2% in DMSO suspension #72mL 1 each 3  . VITAMIN D PO Take 5,000 Int'l Units by mouth every other day.    . diclofenac sodium (VOLTAREN) 1 % GEL Apply 2 g topically 4 (four) times daily.  (Patient not taking: Reported on 11/23/2020)     No current facility-administered medications on file prior to visit.    ALLERGIES: Bee venom and Prednisone  Family History  Problem Relation Age of Onset  . Stroke Mother   . Diabetes Mother   . Diabetes Father   . Hypertension Sister   . Esophageal cancer Paternal Grandfather 80  . Colon cancer Neg Hx   . Stomach cancer Neg Hx   . Rectal cancer Neg Hx   . Breast cancer Neg Hx   . Colon polyps Neg Hx       Review of Systems  Constitutional: Negative.   HENT: Negative.   Eyes: Negative.  Respiratory: Negative.   Cardiovascular: Negative.   Gastrointestinal: Negative.   Endocrine: Negative.   Genitourinary:       Vaginal itching, discharge with odor  Musculoskeletal: Negative.   Skin: Negative.   Allergic/Immunologic: Negative.   Neurological: Negative.   Hematological: Negative.   Psychiatric/Behavioral: Negative.     PHYSICAL EXAMINATION:    BP 118/78   Pulse 70   Resp 16   Wt 182 lb (82.6 kg)   BMI 30.29 kg/m     General appearance: alert, cooperative, no acute distress  Lymph:  no inguinal LAD noted  Pelvic: External genitalia:  no lesions              Urethra:  normal appearing urethra with no masses, tenderness or lesions               Bartholins and Skenes: normal                 Vagina: moderate amount white discharge, no erythema or edema. Hair removed from vagina.    Uterus/Cervix: absent               Chaperone, Bianca, CMA, was present for exam.  Assessment/Plan: Vaginal discharge - Plan: WET PREP FOR [place], YEAST, CLUE  Vulvar irritation - Plan: triamcinolone ointment (KENALOG) 0.1 %    Discussed wet mount essentially normal. Possible causes of symptoms are sensitivity to the massage oil she used or possible irritation from the hair that was in vagina (removed). Pt to return to clinic is symptoms do not resolve  20 minutes of total time was spent for this patient encounter, including preparation, face-to-face counseling with the patient and coordination of care, and documentation of the encounter.

## 2020-11-23 NOTE — Patient Instructions (Signed)
Try Vaseline externally at first. If no relief, my try triamcinolone (pea-size) twice weekly x 2 weeks

## 2020-11-25 ENCOUNTER — Other Ambulatory Visit: Payer: Self-pay

## 2020-11-25 ENCOUNTER — Encounter: Payer: Self-pay | Admitting: Podiatry

## 2020-11-25 ENCOUNTER — Ambulatory Visit: Payer: PPO | Admitting: Podiatry

## 2020-11-25 DIAGNOSIS — M79675 Pain in left toe(s): Secondary | ICD-10-CM

## 2020-11-25 DIAGNOSIS — M79674 Pain in right toe(s): Secondary | ICD-10-CM

## 2020-11-25 DIAGNOSIS — M25571 Pain in right ankle and joints of right foot: Secondary | ICD-10-CM

## 2020-11-25 DIAGNOSIS — B351 Tinea unguium: Secondary | ICD-10-CM

## 2020-11-27 ENCOUNTER — Ambulatory Visit: Payer: PPO | Admitting: Podiatry

## 2020-11-29 NOTE — Progress Notes (Signed)
  Subjective:  Patient ID: Debra Woodward, female    DOB: July 09, 1951,  MRN: [id number]  [name] presents to clinic today for painful thick toenails that are difficult to trim. Pain interferes with ambulation. Aggravating factors include wearing enclosed shoe gear. Pain is relieved with periodic professional debridement.  She continues use of topical antifungal solution on toenails from [place].   She states she is starting to experience pain of the inside of her right ankle for the past two days. This is an area where she has had surgery in the past. She denies any known trauma. Pain mostly presents in the morning. She has been wearing compression sleeve.  Review of Systems: Negative except as noted in the HPI. Past Medical History:  Diagnosis Date  . ALLERGIC RHINITIS 09/14/2006  . Allergy   . Fibroid   . Hx of adenomatous colonic polyps 07/28/2015  . Seasonal allergies    Past Surgical History:  Procedure Laterality Date  . ABDOMINAL HYSTERECTOMY  1996  . BREAST EXCISIONAL BIOPSY Left 1983   No scar seen  . BREAST EXCISIONAL BIOPSY Right 1987   NO scar seen  . BREAST SURGERY Bilateral 1983, 1996   benign  . CARPAL TUNNEL RELEASE Right 2008  . COLONOSCOPY  07/23/2015  . CYSTECTOMY Bilateral 1983, 1988   breast; benign  . KNEE ARTHROSCOPY Right 2010  . TOE SURGERY Bilateral   . TOTAL KNEE ARTHROPLASTY Right 2011   Allergies  Allergen Reactions  . Bee Venom Hives  . Prednisone Other (See Comments)    hallucinations   @SHSOC @ Objective:   Constitutional Debra Woodward is a pleasant 70 y.o. African American female, WD, WN in NAD.  AAO x 3.   Vascular Dorsalis pedis pulses palpable bilaterally. Posterior tibial pulses palpable bilaterally. Capillary refill normal to all digits. No cyanosis or clubbing noted. Pedal hair growth normal.  Neurologic Normal speech. Oriented to person, place, and time. Epicritic sensation to light touch grossly present bilaterally.  Protective sensation intact with 10 gram monofilament b/l LE.  Dermatologic Pedal skin with normal turgor, texture and tone b/l.  Nails thick, elongated, dystrophic with pain on palpation x 10. Well healed longitudinal surgical scar noted along dorsal aspect of 5th metatarsal. No open wounds. Well healed surgical scar medial aspect right ankle. No erythema, no warmth, no edema.  Orthopedic: Normal joint ROM without pain or crepitus bilaterally. No visible deformities. No bony tenderness. She has some tenderness along medial scar of right ankle. No edema, no erythema, no warmth.    Radiographs: None Assessment:   1. Pain due to onychomycosis of toenails of both feet   2. Acute right ankle pain    Plan:  Patient was evaluated and treated and all questions answered.  Onychomycosis with pain -Nails palliatively debridement as below -Educated on self-care  Procedure: Nail Debridement Rationale: Pain Type of Debridement: manual, sharp debridement. Instrumentation: Nail nipper, rotary burr. Number of Nails: 10 -Examined patient. -Continue use of compression sleeve daily. Do not wear 24 hours. Remove in the evening. Follow up with Dr. Milinda Pointer regarding ankle pain.  -Toenails 1-5 b/l were debrided in length and girth with sterile nail nippers and dremel without iatrogenic bleeding.  -Patient to report any pedal injuries to medical professional immediately. -Patient to continue soft, supportive shoe gear daily. -Patient/POA to call should there be question/concern in the interim.  Return in about 3 months (around 02/23/2021).  Marzetta Board, DPM

## 2020-12-02 ENCOUNTER — Other Ambulatory Visit: Payer: Self-pay | Admitting: Internal Medicine

## 2020-12-02 DIAGNOSIS — Z1231 Encounter for screening mammogram for malignant neoplasm of breast: Secondary | ICD-10-CM

## 2020-12-18 ENCOUNTER — Other Ambulatory Visit (INDEPENDENT_AMBULATORY_CARE_PROVIDER_SITE_OTHER): Payer: Self-pay | Admitting: Otolaryngology

## 2020-12-24 ENCOUNTER — Other Ambulatory Visit (INDEPENDENT_AMBULATORY_CARE_PROVIDER_SITE_OTHER): Payer: Self-pay | Admitting: Otolaryngology

## 2021-01-15 ENCOUNTER — Other Ambulatory Visit: Payer: Self-pay

## 2021-01-15 ENCOUNTER — Other Ambulatory Visit (INDEPENDENT_AMBULATORY_CARE_PROVIDER_SITE_OTHER): Payer: Self-pay

## 2021-01-15 ENCOUNTER — Ambulatory Visit
Admission: RE | Admit: 2021-01-15 | Discharge: 2021-01-15 | Disposition: A | Discharge status: Home / Self Care | Payer: Medicare HMO | Source: Ambulatory Visit | Attending: Internal Medicine | Admitting: Internal Medicine

## 2021-01-15 DIAGNOSIS — Z1231 Encounter for screening mammogram for malignant neoplasm of breast: Secondary | ICD-10-CM

## 2021-01-15 MED ORDER — FLUTICASONE PROPIONATE 50 MCG/ACT NA SUSP: NASAL | 6 refills | Status: AC

## 2021-02-02 ENCOUNTER — Ambulatory Visit (INDEPENDENT_AMBULATORY_CARE_PROVIDER_SITE_OTHER): Payer: Medicare HMO | Admitting: Pharmacist

## 2021-02-02 ENCOUNTER — Other Ambulatory Visit: Payer: Self-pay

## 2021-02-02 DIAGNOSIS — M170 Bilateral primary osteoarthritis of knee: Secondary | ICD-10-CM

## 2021-02-02 DIAGNOSIS — F17200 Nicotine dependence, unspecified, uncomplicated: Secondary | ICD-10-CM | POA: Diagnosis not present

## 2021-02-02 NOTE — Progress Notes (Signed)
Chronic Care Management Pharmacy Note  02/10/2021 Name:  Debra Woodward MRN:  [id number] DOB:  04/19/1951  Subjective: Debra Woodward is an 70 y.o. year old female who is a primary patient of Panosh, [name], MD.  The CCM team was consulted for assistance with disease management and care coordination needs.    Engaged with patient by telephone for follow up visit in response to provider referral for pharmacy case management and/or care coordination services.   Consent to Services:  The patient was given information about Chronic Care Management services, agreed to services, and gave verbal consent prior to initiation of services.  Please see initial visit note for detailed documentation.   Patient Care Team: Panosh, [name], MD as PCP - General (Internal Medicine) Huel Cote, NP (Inactive) as Nurse Practitioner (Obstetrics and Gynecology) Viona Gilmore, [facility] as Pharmacist (Pharmacist)  Recent office visits: 09/23/20 Shanon Ace, MD: Patient presented for annual exam. A1c decreased from last visit but still elevated. BP elevated in office.  06/22/20 Ofilia Neas, LPN: [name]  Recent consult visits: 11/25/20 Acquanetta Sit, DPM (podiatry): Patient presented for nail trim.  11/23/20 Karma Ganja, NP (GYN): Patient presented for vaginitis. Recommended vaseline first and then triamcinolone.   08/25/20 Marny Lowenstein, NP (OBGYN): Patient presented for vaginal discharge. Wet prep was unremarkable with normal pelvic exam.  08/21/20 Acquanetta Sit, DPM (podiatry): Patient presented for toe nail removal.  Hospital visits: None in previous 6 months  Objective:  Lab Results  Component Value Date   CREATININE 0.74 09/23/2020   BUN 9 09/23/2020   GFR 105.91 09/23/2019   GFRNONAA 83 09/23/2020   GFRAA 96 09/23/2020   NA 140 09/23/2020   K 4.6 09/23/2020   CALCIUM 10.4 09/23/2020   CO2 27 09/23/2020   GLUCOSE  111 (H) 09/23/2020    Lab Results  Component Value Date/Time   HGBA1C 6.2 (H) 09/23/2020 12:06 PM   HGBA1C 6.4 09/23/2019 04:28 PM   GFR 105.91 09/23/2019 04:28 PM   GFR 99.13 09/18/2018 11:45 AM    Last diabetic Eye exam: No results found for: HMDIABEYEEXA  Last diabetic Foot exam: No results found for: HMDIABFOOTEX   Lab Results  Component Value Date   CHOL 153 09/23/2020   HDL 67 09/23/2020   LDLCALC 70 09/23/2020   TRIG 81 09/23/2020   CHOLHDL 2.3 09/23/2020    Hepatic Function Latest Ref Rng & Units 09/23/2020 06/26/2019 03/08/2017  Total Protein 6.1 - 8.1 g/dL 7.0 7.7 6.5  Albumin 3.5 - 5.0 g/dL - 4.0 4.1  AST 10 - 35 U/L _0 ALT 6 - 29 U/L 13 44 13  Alk Phosphatase 38 - 126 U/L - 73 78  Total Bilirubin 0.2 - 1.2 mg/dL 0.6 0.4 0.5  Bilirubin, Direct 0.0 - 0.2 mg/dL 0.1 - 0.1    Lab Results  Component Value Date/Time   TSH 2.41 09/23/2019 04:28 PM   TSH 1.74 03/08/2017 08:04 AM    CBC Latest Ref Rng & Units 09/23/2020 09/23/2019 06/26/2019  WBC 3.8 - 10.8 Thousand/uL 5.6 6.5 8.1  Hemoglobin 11.7 - 15.5 g/dL 14.3 12.9 13.1  Hematocrit 35.0 - 45.0 % 41.1 39.0 40.2  Platelets 140 - 400 Thousand/uL 408(H) 371.0 439(H)    Lab Results  Component Value Date/Time   VD25OH 22.44 (L) 03/11/2016 08:58 AM   VD25OH 23 (L) 03/10/2015 04:27 PM    Clinical ASCVD: No  The 10-year ASCVD risk  score Mikey Bussing DC Jr., et al., 2013) is: 14.1%   Values used to calculate the score:     Age: 63 years     Sex: Female     Is Non-Hispanic African American: Yes     Diabetic: No     Tobacco smoker: Yes     Systolic Blood Pressure: 789 mmHg     Is BP treated: No     HDL Cholesterol: 67 mg/dL     Total Cholesterol: 153 mg/dL    Depression screen [facility] 2/9 06/22/2020 09/23/2019 08/22/2018  Decreased Interest 0 0 0  Down, Depressed, Hopeless 0 0 0  PHQ - 2 Score 0 0 0  Altered sleeping 0 - -  Tired, decreased energy 0 - -  Change in appetite 0 - -  Feeling bad or failure about  yourself  0 - -  Trouble concentrating 0 - -  Moving slowly or fidgety/restless 0 - -  Suicidal thoughts 0 - -  PHQ-9 Score 0 - -  Difficult doing work/chores Not difficult at all - -      Social History   Tobacco Use  Smoking Status Current Every Day Smoker  . Types: Cigarettes  Smokeless Tobacco Never Used   BP Readings from Last 3 Encounters:  11/23/20 118/78  09/23/20 (!) 150/88  08/25/20 132/84   Pulse Readings from Last 3 Encounters:  11/23/20 70  09/23/20 80  06/22/20 79   Wt Readings from Last 3 Encounters:  11/23/20 182 lb (82.6 kg)  09/23/20 182 lb 6.4 oz (82.7 kg)  06/22/20 183 lb (83 kg)   BMI Readings from Last 3 Encounters:  11/23/20 30.29 kg/m  09/23/20 30.35 kg/m  06/22/20 30.45 kg/m    Assessment/Interventions: Review of patient past medical history, allergies, medications, health status, including review of consultants reports, laboratory and other test data, was performed as part of comprehensive evaluation and provision of chronic care management services.   SDOH:  (Social Determinants of Health) assessments and interventions performed: No   CCM Care Plan  Allergies  Allergen Reactions  . Bee Venom Hives  . Prednisone Other (See Comments)    hallucinations    Medications Reviewed Today    Reviewed by Marzetta Board, DPM (Physician) on 11/25/20 at 1056  Med List Status: <None>  Medication Order Taking? Sig Documenting Provider Last Dose Status Informant  Ascorbic Acid (VITAMIN C) 500 MG CAPS [id number] Yes Take 1 capsule by mouth daily. [provider] Taking Active   cetirizine (ZYRTEC) 10 MG tablet [id number] Yes Take 10 mg by mouth daily. Patient alternates with Allegra [provider] Taking Active   diclofenac sodium (VOLTAREN) 1 % GEL [id number] Yes Apply 2 g topically 4 (four) times daily. [provider] Taking Active   docusate sodium (COLACE) 100 MG capsule [id number] Yes Take 1 capsule (100 mg total)  by mouth every 12 (twelve) hours. Delia Heady, PA-C Taking Active   fexofenadine (ALLEGRA) 60 MG tablet [id number] Yes Take 60 mg by mouth daily. Patient alternates with Zyrtec [provider] Taking Active Self  fluticasone (FLONASE) 50 MCG/ACT nasal spray [id number] Yes SMARTSIG:2 Spray(s) Both Nares Every Night [provider] Taking Active   Glycerin-Polysorbate 80 (REFRESH DRY EYE THERAPY OP) [id number] Yes Apply to eye. [provider] Taking Active   Multiple Vitamin (MULTIVITAMIN) tablet [id number] Yes Take 1 tablet by mouth daily. [provider] Taking Active Self  NONFORMULARY OR COMPOUNDED ITEM [id number] Yes Apply 1-2 g topically daily. [facility]:  Nail laquer Marzetta Board, DPM Taking Active Self  NONFORMULARY OR COMPOUNDED ITEM [id number] Yes Antifungal solution: Terbinafine 3%, Fluconazole 2%, Tea Tree Oil 5%, Urea 10%, Ibuprofen 2% in DMSO suspension #76m GMarzetta Board DPM Taking Active   triamcinolone ointment (KENALOG) 0.1 % [id number] Apply small amt twice per day x 14 days then twice weekly DKarma Ganja NP Taking Active   VITAMIN D PO [id number] Take 5,000 Int'l Units by mouth every other day. [provider] Taking Active           Patient Active Problem List   Diagnosis Date Noted  . Arthritis of both knees 11/15/2017  . Aftercare 11/15/2017  . Hx of adenomatous colonic polyps 07/28/2015  . Degenerative disc disease, lumbar sacral 11/13/2012  . Elevated blood pressure reading 11/13/2012  . Allergic rhinitis 09/14/2006    Immunization History  Administered Date(s) Administered  . Hep A / Hep B 01/25/2018, 02/27/2018, 08/10/2018  . Influenza Split 08/07/2012, 08/23/2014  . Influenza Whole 08/14/2001, 09/10/2007, 08/16/2010, 08/15/2011  . Influenza, High Dose Seasonal PF 08/23/2017, 08/10/2018, 09/02/2019, 10/01/2020  . Influenza,inj,Quad PF,6+ Mos 08/16/2016  . Influenza-Unspecified 08/14/2013,  08/14/2017  . PFIZER(Purple Top)SARS-COV-2 Vaccination 12/03/2019, 12/25/2019, 08/21/2020  . Pneumococcal Conjugate-13 09/30/2016  . Pneumococcal Polysaccharide-23 11/15/2011, 09/18/2018  . Pneumococcal-Unspecified 07/15/2016  . Td 03/13/2009  . Tdap 01/25/2018  . Zoster 05/28/2012  . Zoster Recombinat (Shingrix) 03/22/2017, 12/11/2017    Conditions to be addressed/monitored:  Osteoarthritis, Tobacco use, Allergic Rhinitis and elevated blood pressure and pre-diabetes  Care Plan : CCrescent Updates made by PViona Gilmore REast Pointsince 02/10/2021 12:00 AM    Problem: Problem: Elevated blood pressure, tobacco use, allergic rhinitis, arthritis, prediabetes     Long-Range Goal: Patient-Specific Goal   Start Date: 02/02/2021  Expected End Date: 02/02/2022  This Visit's Progress: On track  Priority: High  Note:   Current Barriers:  . Unable to independently monitor therapeutic efficacy  Pharmacist Clinical Goal(s):  .Marland KitchenPatient will achieve adherence to monitoring guidelines and medication adherence to achieve therapeutic efficacy through collaboration with PharmD and provider.    Interventions: . 1:1 collaboration with Panosh, WStandley Brooking MD regarding development and update of comprehensive plan of care as evidenced by provider attestation and co-signature . Inter-disciplinary care team collaboration (see longitudinal plan of care) . Comprehensive medication review performed; medication list updated in electronic medical record  Elevated blood pressure (BP goal <130/80) -Controlled -Current treatment: . No medications -Medications previously tried: none  -Current home readings: not checking regularly -Current dietary habits: does not cook with salt and doesn't eat red meat; eats mostly seafood, chicken or [place] eats lots of vegetables and some fruit -Current exercise habits: limited with taking care of her sister -Denies hypotensive/hypertensive symptoms -Educated on  Daily salt intake goal < 2300 mg; Exercise goal of 150 minutes per week; Importance of home blood pressure monitoring; -Counseled to monitor BP at home as directed, document, and provide log at future appointments -Counseled on diet and exercise extensively  Pre-diabetes (A1c goal <6.5%) -Controlled -Current medications: . No medications -Medications previously tried: none  -Current home glucose readings . fasting glucose: does not check . post prandial glucose: does not check -Denies hypoglycemic/hyperglycemic symptoms -Current meal patterns:  . breakfast: n/a  . lunch: n/a  . dinner: n/a . snacks: n/a . drinks: n/a -Current exercise: n/a -Educated on A1c and blood sugar goals; Exercise goal of 150 minutes per week; Carbohydrate counting and/or plate method -Counseled  to check feet daily and get yearly eye exams -Counseled on diet and exercise extensively  Tobacco use (Goal quit smoking) -Uncontrolled -Previous quit attempts: patches (too much nicotine), gum (flavor), quit cold [place] for about 6 months (6-7 years ago)  -Current treatment  . No medications -Patient smokes After 30 minutes of waking -Patient triggers include:  habit and with breakfast -On a scale of 1-10, reports MOTIVATION to quit is 8 -On a scale of 1-10, reports CONFIDENCE in quitting is 8 -Counseled to allow lozenge to dissolve and absorb in cheek pocket, rather than swallow, to reduce GI side effects. -Counseled on using distractions to stop herself from picking up a cigarette and putting chewing gum on top of her cigarettes in her bag to give an added layer of distraction Recommended setting a goal of 1 less cigarette per day per week to see if she can slowly taper off  Allergic rhinitis (Goal: minimize symptoms) -Controlled -Current treatment   fexofenadine (Allegra) 62m, 1 tablet once daily   cetirizine (Zyrtec) 110m 1 tablet once daily (alternates antihistamines- does not take together)     fluticasone (Flonase) 5048m act nasal spray, 2 sprays into both nostrils every night  -Medications previously tried: none  -Recommended to continue current medication Patient has been going to ENT every 3-4 months.  Arthritis (Goal: minimize pain) -Controlled -Current treatment  . diclofenac 1% gel, apply 2 grams four times daily (uses as needed) -Medications previously tried: none  -Recommended to continue current medication   Health Maintenance -Vaccine gaps: none -Current therapy:  . Docusate 100 mg 1 capsule every 12 hours . Refresh dry eye as needed . Multivitamin 1 tablet daily . Vitamin C 500 mg 1 capsule daily . Antifungal solution apply as needed to toenails . Triamcinolone 0.1% ointment apply twice weekly . Vitamin D 5000 every other day -Educated on Cost vs benefit of each product must be carefully weighed by individual consumer -Patient is satisfied with current therapy and denies issues -Recommended to continue current medication  Patient Goals/Self-Care Activities . Patient will:  - take medications as prescribed check blood pressure as directed, document, and provide at future appointments target a minimum of 150 minutes of moderate intensity exercise weekly  Follow Up Plan: Telephone follow up appointment with care management team member scheduled for: 3 month       Medication Assistance: None required.  Patient affirms current coverage meets needs.  [facility] [place]: [phone]: [phone] [address] [place]one: [phone]: [phone] pill box? No - puts all of her medicines on her dresser Pt endorses 95% compliance  We discussed: Current pharmacy is preferred with insurance plan and  patient is satisfied with pharmacy services Patient decided to: Continue current medication management strategy  Care Plan and Follow Up Patient Decision:  Patient agrees to Care Plan and Follow-up.  Plan: Telephone follow up appointment with care management team member scheduled for:  3 months  MadJeni SallesharmD BCABrewster Hillarmacist LeBOxon Hill BraNew [phone]

## 2021-02-10 ENCOUNTER — Ambulatory Visit (INDEPENDENT_AMBULATORY_CARE_PROVIDER_SITE_OTHER): Payer: Medicare HMO | Admitting: Otolaryngology

## 2021-02-10 ENCOUNTER — Other Ambulatory Visit: Payer: Self-pay

## 2021-02-10 DIAGNOSIS — H6123 Impacted cerumen, bilateral: Secondary | ICD-10-CM | POA: Diagnosis not present

## 2021-02-10 DIAGNOSIS — H90A22 Sensorineural hearing loss, unilateral, left ear, with restricted hearing on the contralateral side: Secondary | ICD-10-CM

## 2021-02-10 NOTE — Progress Notes (Signed)
HPI: Debra Woodward is a 70 y.o. female who presents for evaluation of wax buildup in her ears.  She is having a little bit more difficulty hearing in the left ear compared to the right.  She has had her ears cleaned all the regular basis last time being about 6 months ago..  Past Medical History:  Diagnosis Date  . ALLERGIC RHINITIS 09/14/2006  . Allergy   . Fibroid   . Hx of adenomatous colonic polyps 07/28/2015  . Seasonal allergies    Past Surgical History:  Procedure Laterality Date  . ABDOMINAL HYSTERECTOMY  1996  . BREAST EXCISIONAL BIOPSY Left 1983   No scar seen  . BREAST EXCISIONAL BIOPSY Right 1987   NO scar seen  . BREAST SURGERY Bilateral 1983, 1996   benign  . CARPAL TUNNEL RELEASE Right 2008  . COLONOSCOPY  07/23/2015  . CYSTECTOMY Bilateral 1983, 1988   breast; benign  . KNEE ARTHROSCOPY Right 2010  . TOE SURGERY Bilateral   . TOTAL KNEE ARTHROPLASTY Right 2011   Social History   Socioeconomic History  . Marital status: Single    Spouse name: Not on file  . Number of children: Not on file  . Years of education: Not on file  . Highest education level: Not on file  Occupational History  . Not on file  Tobacco Use  . Smoking status: Current Every Day Smoker    Types: Cigarettes  . Smokeless tobacco: Never Used  Vaping Use  . Vaping Use: Never used  Substance and Sexual Activity  . Alcohol use: Yes    Alcohol/week: 2.0 standard drinks    Types: 2 Glasses of wine per week    Comment: social  . Drug use: No  . Sexual activity: Yes    Birth control/protection: Surgical    Comment: HYST  Other Topics Concern  . 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    Social Determinants of Health   Financial Resource Strain: Low Risk   . Difficulty of Paying Living Expenses: Not hard at all  Food Insecurity: Not on file  Transportation Needs: No Transportation Needs  .  Lack of Transportation (Medical): No  . Lack of Transportation (Non-Medical): No  Physical Activity: Sufficiently Active  . Days of Exercise per Week: 4 days  . Minutes of Exercise per Session: 60 min  Stress: No Stress Concern Present  . Feeling of Stress : Not at all  Social Connections: Moderately Integrated  . Frequency of Communication with Friends and Family: More than three times a week  . Frequency of Social Gatherings with Friends and Family: Once a week  . Attends Religious Services: More than 4 times per year  . Active Member of Clubs or Organizations: Yes  . Attends Archivist Meetings: More than 4 times per year  . Marital Status: Divorced   Family History  Problem Relation Age of Onset  . Stroke Mother   . Diabetes Mother   . Diabetes Father   . Hypertension Sister   . Esophageal cancer Paternal Grandfather 4  . Colon cancer Neg Hx   . Stomach cancer Neg Hx   . Rectal cancer Neg Hx   . Breast cancer Neg Hx   . Colon polyps Neg Hx    Allergies  Allergen Reactions  .  Bee Venom Hives  . Prednisone Other (See Comments)    hallucinations   Prior to Admission medications   Medication Sig Start Date End Date Taking? Authorizing Provider  Ascorbic Acid (VITAMIN C) 500 MG CAPS Take 1 capsule by mouth daily.    [provider]  cetirizine (ZYRTEC) 10 MG tablet Take 10 mg by mouth daily. Patient alternates with Allegra    [provider]  diclofenac sodium (VOLTAREN) 1 % GEL Apply 2 g topically 4 (four) times daily. 01/16/19   [provider]  docusate sodium (COLACE) 100 MG capsule Take 1 capsule (100 mg total) by mouth every 12 (twelve) hours. 06/26/19   Khatri, Hina, PA-C  fexofenadine (ALLEGRA) 60 MG tablet Take 60 mg by mouth daily. Patient alternates with Zyrtec    [provider]  fluticasone (FLONASE) 50 MCG/ACT nasal spray USE 2 SPRAY(S) IN [facility] NOSTRIL ONCE DAILY AT  NIGHT 01/15/21   Rozetta Nunnery, MD   Glycerin-Polysorbate 80 (REFRESH DRY EYE THERAPY OP) Apply to eye.    [provider]  Multiple Vitamin (MULTIVITAMIN) tablet Take 1 tablet by mouth daily.    [provider]  NONFORMULARY OR COMPOUNDED ITEM Apply 1-2 g topically daily. [facility]: Nail laquer 02/12/19   Marzetta Board, DPM  NONFORMULARY OR COMPOUNDED ITEM Antifungal solution: Terbinafine 3%, Fluconazole 2%, Tea Tree Oil 5%, Urea 10%, Ibuprofen 2% in DMSO suspension #79mL 08/24/20   [place], Stephani Police, DPM  triamcinolone ointment (KENALOG) 0.1 % Apply small amt twice per day x 14 days then twice weekly 11/23/20   Karma Ganja, NP  VITAMIN D PO Take 5,000 Int'l Units by mouth every other day.    [provider]     Positive ROS: Otherwise negative  All other systems have been reviewed and were otherwise negative with the exception of those mentioned in the HPI and as above.  Physical Exam: Constitutional: Alert, well-appearing, no acute distress Ears: External ears without lesions or tenderness. Ear canals with a mild amount of wax buildup in both ears that was cleaned with curette suction and forceps.  TMs are clear bilaterally..  On tuning fork testing she has slightly more hearing loss in the left side compared to the right but this is minimal on both sides. Nasal: External nose without lesions. Clear nasal passages Oral: Oropharynx clear. Neck: No palpable adenopathy or masses Respiratory: Breathing comfortably  Skin: No facial/neck lesions or rash noted.  Cerumen impaction removal  Date/Time: 02/10/2021 5:32 PM Performed by: Rozetta Nunnery, MD Authorized by: Rozetta Nunnery, MD   Consent:    Consent obtained:  Verbal   Consent given by:  Patient   Risks discussed:  Pain and bleeding Procedure details:    Location:  L ear and R ear   Procedure type: curette, suction and forceps   Post-procedure details:    Inspection:  TM intact and canal normal   Hearing  quality:  Improved   Patient tolerance of procedure:  Tolerated well, no immediate complications Comments:     TMs are clear bilaterally.    Assessment: Cerumen buildup Mild sensorineural hearing loss slightly worse on the left side.  This was noted on hearing test performed in 2007.  However she has not had a recent audiogram.  Plan: Suggested getting a hearing test to evaluate the amount of hearing loss in the left ear.  She can arrange this at next visit when she has her ears cleaned or can call hearing life  earlier if she is having difficulty with her hearing.  Reviewed with her that the treatment option of this would be use of a hearing aid.  Radene Journey, MD

## 2021-02-10 NOTE — Patient Instructions (Signed)
Hi Debra Woodward,  Keep working on slowly cutting back on your cigarettes and using all of the distractions to your advantage! I know you can quit and I think you will feel much better once you have.   Please reach out to me if you have any questions or need anything before our follow up!  Best, Maddie  Jeni Salles, PharmD, [name] [phone]  Visit Information  Goals Addressed   None    Patient Care Plan: CCM Pharmacy Care Plan    Problem Identified: Problem: Elevated blood pressure, tobacco use, allergic rhinitis, arthritis, prediabetes     Long-Range Goal: Patient-Specific Goal   Start Date: 02/02/2021  Expected End Date: 02/02/2022  This Visit's Progress: On track  Priority: High  Note:   Current Barriers:  . Unable to independently monitor therapeutic efficacy  Pharmacist Clinical Goal(s):  Marland Kitchen Patient will achieve adherence to monitoring guidelines and medication adherence to achieve therapeutic efficacy through collaboration with PharmD and provider.    Interventions: . 1:1 collaboration with Panosh, [name], MD regarding development and update of comprehensive plan of care as evidenced by provider attestation and co-signature . Inter-disciplinary care team collaboration (see longitudinal plan of care) . Comprehensive medication review performed; medication list updated in electronic medical record  Elevated blood pressure (BP goal <130/80) -Controlled -Current treatment: . No medications -Medications previously tried: none  -Current home readings: not checking regularly -Current dietary habits: does not cook with salt and doesn't eat red meat; eats mostly seafood, chicken or [place]; eats lots of vegetables and some fruit -Current exercise habits: limited with taking care of her sister -Denies hypotensive/hypertensive symptoms -Educated on Daily salt intake goal < 2300 mg; Exercise goal of 150 minutes per  week; Importance of home blood pressure monitoring; -Counseled to monitor BP at home as directed, document, and provide log at future appointments -Counseled on diet and exercise extensively  Pre-diabetes (A1c goal <6.5%) -Controlled -Current medications: . No medications -Medications previously tried: none  -Current home glucose readings . fasting glucose: does not check . post prandial glucose: does not check -Denies hypoglycemic/hyperglycemic symptoms -Current meal patterns:  . breakfast: n/a  . lunch: n/a  . dinner: n/a . snacks: n/a . drinks: n/a -Current exercise: n/a -Educated on A1c and blood sugar goals; Exercise goal of 150 minutes per week; Carbohydrate counting and/or plate method -Counseled to check feet daily and get yearly eye exams -Counseled on diet and exercise extensively  Tobacco use (Goal quit smoking) -Uncontrolled -Previous quit attempts: patches (too much nicotine), gum (flavor), quit cold [place] for about 6 months (6-7 years ago)  -Current treatment  . No medications -Patient smokes After 30 minutes of waking -Patient triggers include:  habit and with breakfast -On a scale of 1-10, reports MOTIVATION to quit is 8 -On a scale of 1-10, reports CONFIDENCE in quitting is 8 -Counseled to allow lozenge to dissolve and absorb in cheek pocket, rather than swallow, to reduce GI side effects. -Counseled on using distractions to stop herself from picking up a cigarette and putting chewing gum on top of her cigarettes in her bag to give an added layer of distraction Recommended setting a goal of 1 less cigarette per day per week to see if she can slowly taper off  Allergic rhinitis (Goal: minimize symptoms) -Controlled -Current treatment   fexofenadine (Allegra) 60mg , 1 tablet once daily   cetirizine (Zyrtec) 10mg , 1 tablet once daily (alternates antihistamines- does not take together)  fluticasone (Flonase) 44mcg/ act nasal spray, 2 sprays into both  nostrils every night  -Medications previously tried: none  -Recommended to continue current medication Patient has been going to ENT every 3-4 months.  Arthritis (Goal: minimize pain) -Controlled -Current treatment  . diclofenac 1% gel, apply 2 grams four times daily (uses as needed) -Medications previously tried: none  -Recommended to continue current medication   Health Maintenance -Vaccine gaps: none -Current therapy:  . Docusate 100 mg 1 capsule every 12 hours . Refresh dry eye as needed . Multivitamin 1 tablet daily . Vitamin C 500 mg 1 capsule daily . Antifungal solution apply as needed to toenails . Triamcinolone 0.1% ointment apply twice weekly . Vitamin D 5000 every other day -Educated on Cost vs benefit of each product must be carefully weighed by individual consumer -Patient is satisfied with current therapy and denies issues -Recommended to continue current medication  Patient Goals/Self-Care Activities . Patient will:  - take medications as prescribed check blood pressure as directed, document, and provide at future appointments target a minimum of 150 minutes of moderate intensity exercise weekly  Follow Up Plan: Telephone follow up appointment with care management team member scheduled for: 3 month       Patient verbalizes understanding of instructions provided today and agrees to view in [place].  Telephone follow up appointment with pharmacy team member scheduled for: 3 months  Viona Gilmore, [facility]

## 2021-02-24 ENCOUNTER — Encounter: Payer: Self-pay | Admitting: Podiatry

## 2021-02-24 ENCOUNTER — Ambulatory Visit: Payer: PPO | Admitting: Podiatry

## 2021-02-24 ENCOUNTER — Other Ambulatory Visit: Payer: Self-pay

## 2021-02-24 DIAGNOSIS — M79675 Pain in left toe(s): Secondary | ICD-10-CM | POA: Diagnosis not present

## 2021-02-24 DIAGNOSIS — M79674 Pain in right toe(s): Secondary | ICD-10-CM | POA: Diagnosis not present

## 2021-02-24 DIAGNOSIS — B351 Tinea unguium: Secondary | ICD-10-CM

## 2021-02-24 NOTE — Progress Notes (Signed)
This patient returns to the office for evaluation and treatment of long thick painful nails .  This patient is unable to trim her own nails since the patient cannot reach her feet.  Patient says the nails are painful walking and wearing his shoes.  He returns for preventive foot care services.  General Appearance  Alert, conversant and in no acute stress.  Vascular  Dorsalis pedis and posterior tibial  pulses are palpable  bilaterally.  Capillary return is within normal limits  bilaterally. Temperature is within normal limits  bilaterally.  Neurologic  Senn-Weinstein monofilament wire test within normal limits  bilaterally. Muscle power within normal limits bilaterally.  Nails Thick disfigured discolored nails with subungual debris  from hallux to fifth toes bilaterally. No evidence of bacterial infection or drainage bilaterally.  Pincer nails hallux  B/L.  Orthopedic  No limitations of motion  feet .  No crepitus or effusions noted.  No bony pathology or digital deformities noted.  Skin  normotropic skin with no porokeratosis noted bilaterally.  No signs of infections or ulcers noted.     Onychomycosis  Pain in toes right foot  Pain in toes left foot  Debridement  of nails  1-5  B/L with a nail nipper.  Nails were then filed using a dremel tool with no incidents.    RTC 10 weeks   Gardiner Barefoot DPM

## 2021-03-02 ENCOUNTER — Telehealth: Payer: Self-pay | Admitting: Pharmacist

## 2021-03-02 NOTE — Progress Notes (Signed)
Chronic Care Management Pharmacy Assistant   Name: Lakisa Lotz  MRN: [id number] DOB: 01-14-1951   Reason for Encounter:Disease State For HTN.   Conditions to be addressed/monitored: Allergies, DDD, Arthritis.HTN.  Recent office visits:  None since 02/02/21  Recent consult visits:  02/24/21 Podiatry Gardiner Barefoot, [name] For onychomycosis for both feet. No medication changes. 02/10/21 Otolaryngolo Rozetta Nunnery, MD. For Hearing loss in both ears/Bilaterl impacted cerumen. Per note: The doctor suggested an hearing test be done and to follow up in the future to get her ears cleaned out No medication changes.  Hospital visits:  None in previous 6 months  Medications: Outpatient Encounter Medications as of 03/02/2021  Medication Sig  . Ascorbic Acid (VITAMIN C) 500 MG CAPS Take 1 capsule by mouth daily.  . cetirizine (ZYRTEC) 10 MG tablet Take 10 mg by mouth daily. Patient alternates with Allegra  . diclofenac sodium (VOLTAREN) 1 % GEL Apply 2 g topically 4 (four) times daily.  Marland Kitchen docusate sodium (COLACE) 100 MG capsule Take 1 capsule (100 mg total) by mouth every 12 (twelve) hours.  . fexofenadine (ALLEGRA) 60 MG tablet Take 60 mg by mouth daily. Patient alternates with Zyrtec  . fluticasone (FLONASE) 50 MCG/ACT nasal spray USE 2 SPRAY(S) IN EACH NOSTRIL ONCE DAILY AT  NIGHT  . Glycerin-Polysorbate 80 (REFRESH DRY EYE THERAPY OP) Apply to eye.  . Multiple Vitamin (MULTIVITAMIN) tablet Take 1 tablet by mouth daily.  . NONFORMULARY OR COMPOUNDED ITEM Apply 1-2 g topically daily. [facility]: Nail laquer  . NONFORMULARY OR COMPOUNDED ITEM Antifungal solution: Terbinafine 3%, Fluconazole 2%, Tea Tree Oil 5%, Urea 10%, Ibuprofen 2% in DMSO suspension #65mL  . triamcinolone ointment (KENALOG) 0.1 % Apply small amt twice per day x 14 days then twice weekly  . VITAMIN D PO Take 5,000 Int'l Units by mouth every other day.   No facility-administered encounter medications on file  as of 03/02/2021.    Reviewed chart prior to disease state call. Spoke with patient regarding BP  Recent Office Vitals: BP Readings from Last 3 Encounters:  11/23/20 118/78  09/23/20 (!) 150/88  08/25/20 132/84   Pulse Readings from Last 3 Encounters:  11/23/20 70  09/23/20 80  06/22/20 79    Wt Readings from Last 3 Encounters:  11/23/20 182 lb (82.6 kg)  09/23/20 182 lb 6.4 oz (82.7 kg)  06/22/20 183 lb (83 kg)     Kidney Function Lab Results  Component Value Date/Time   CREATININE 0.74 09/23/2020 12:06 PM   CREATININE 0.67 09/23/2019 04:28 PM   CREATININE 0.70 06/26/2019 01:48 PM   CREATININE 0.88 03/10/2015 04:27 PM   GFR 105.91 09/23/2019 04:28 PM   GFRNONAA 83 09/23/2020 12:06 PM   GFRAA 96 09/23/2020 12:06 PM    BMP Latest Ref Rng & Units 09/23/2020 09/23/2019 06/26/2019  Glucose 65 - 99 mg/dL 111(H) 102(H) 108(H)  BUN 7 - 25 mg/dL 9 8 10   Creatinine 0.50 - 0.99 mg/dL 0.74 0.67 0.70  BUN/Creat Ratio 6 - 22 (calc) NOT APPLICABLE - -  Sodium 563 - 146 mmol/L 140 138 140  Potassium 3.5 - 5.3 mmol/L 4.6 4.1 3.5  Chloride 98 - 110 mmol/L 106 104 106  CO2 20 - 32 mmol/L 27 29 25   Calcium 8.6 - 10.4 mg/dL 10.4 9.8 9.6    . Current antihypertensive regimen:  o None.  . How often are you checking your Blood Pressure? several times per month   . Current home BP readings:  Patient stated she does not have a blood pressure machine at home.  . What recent interventions/DTPs have been made by any provider to improve Blood Pressure control since last CPP Visit: None.  . Any recent hospitalizations or ED visits since last visit with CPP? Patient stated no.  . What diet changes have been made to improve Blood Pressure Control?  o Patient stated she does not eat red meat or ate fired foods in over 30 years. Patient stated she eats a normal amount of fruit and vegetables. She stated she drinks about 8 bottles of water a day.  . What exercise is being done to improve your  Blood Pressure Control?  o Patient stated when the weather permits she goes walking for about 45 min to an hour and half.   Adherence Review: Is the patient currently on ACE/ARB medication? No.  Does the patient have >5 day gap between last estimated fill dates? N/A  Patient stated she does not have any questions or concerns about her medications at this time.  Star Rating Drugs: None.  [place], [place] Pharmacist Assistant [phone]

## 2021-03-16 NOTE — Progress Notes (Signed)
   Debra Woodward [birth year] [id number]   History:  70 y.o. [id number] presents for breast and pelvic exam without GYN complaints. Postmenopausal, no HRT.1996 TAH for fibroids. Normal pap and mammogram history. Has been caring for sister (colon cancer) and brother (lung cancer) for last 4 months.   Gynecologic History No LMP recorded. Patient has had a hysterectomy.   Contraception: status post hysterectomy  Health Maintenance Last Pap: No longer screening per guidelines Last mammogram: 01/15/2021. Results were: normal Last colonoscopy: 2019. Results were: normal, 5-year recall Last Dexa: 04/14/2020. Results were: normal  Past medical history, past surgical history, family history and social history were all reviewed and documented in the EPIC chart. Single. Grandchildren 12 and 15 live in [place] - she visits a couple of times per year. Caring for brother and sister who were diagnosed with cancer recently but doing ok.   ROS:  A ROS was performed and pertinent positives and negatives are included.  Exam:  Vitals:   03/22/21 0854  BP: 118/70  Pulse: 76  Resp: 16  Weight: 179 lb (81.2 kg)  Height: 5\' 4"  (1.626 m)   Body mass index is 30.73 kg/m.  General appearance:  Normal Thyroid:  Symmetrical, normal in size, without palpable masses or nodularity. Respiratory  Auscultation:  Clear without wheezing or rhonchi Cardiovascular  Auscultation:  Regular rate, without rubs, murmurs or gallops  Edema/varicosities:  Not grossly evident Abdominal  Soft,nontender, without masses, guarding or rebound.  Liver/spleen:  No organomegaly noted  Hernia:  None appreciated  Skin  Inspection:  Grossly normal Breasts: Examined lying and sitting.   Right: Without masses, retractions, nipple discharge or axillary adenopathy.   Left: Without masses, retractions, nipple discharge or axillary adenopathy. Gentitourinary   Inguinal/mons:  Normal without inguinal adenopathy  External genitalia:  Normal  appearing vulva with no masses, tenderness, or lesions  BUS/Urethra/Skene's glands:  Normal  Vagina:  Normal appearing with normal color and discharge, no lesions. Atrophic changes  Cervix:  Absent  Uterus:  Absent  Adnexa/parametria:     Rt: Normal in size, without masses or tenderness.   Lt: Normal in size, without masses or tenderness.  Anus and perineum: Normal  Digital rectal exam: Normal sphincter tone without palpated masses or tenderness  Assessment/Plan:  70 y.o. [id number] for breast and pelvic exam.   Well female exam with routine gynecological exam - Education provided on SBEs, importance of preventative screenings, current guidelines, high calcium diet, regular exercise, and multivitamin daily. Labs with PCP.   History of total abdominal hysterectomy - 1996 for leimyomas  Postmenopausal - no HRT   Screening for cervical cancer - Normal Pap history.  No longer screening per guidelines.   Screening for breast cancer - Normal mammogram history.  Continue annual screenings.  Normal breast exam today.  Screening for colon cancer - 2019 colonoscopy. Will repeat at 5-year interval per GI's recommendation.   Screening for osteoporosis - normal DXA 04/2020. Will repeat at 5-year interval.   Return in 1 year for annual.   [name] DNP, 9:03 AM 03/22/2021

## 2021-03-22 ENCOUNTER — Ambulatory Visit (INDEPENDENT_AMBULATORY_CARE_PROVIDER_SITE_OTHER): Payer: Medicare HMO | Admitting: Nurse Practitioner

## 2021-03-22 ENCOUNTER — Other Ambulatory Visit: Payer: Self-pay

## 2021-03-22 ENCOUNTER — Encounter: Payer: Self-pay | Admitting: Nurse Practitioner

## 2021-03-22 VITALS — BP 118/70 | HR 76 | Resp 16 | Ht 64.0 in | Wt 179.0 lb

## 2021-03-22 DIAGNOSIS — Z01419 Encounter for gynecological examination (general) (routine) without abnormal findings: Secondary | ICD-10-CM | POA: Diagnosis not present

## 2021-03-22 DIAGNOSIS — Z780 Asymptomatic menopausal state: Secondary | ICD-10-CM

## 2021-03-22 DIAGNOSIS — Z90710 Acquired absence of both cervix and uterus: Secondary | ICD-10-CM

## 2021-03-22 NOTE — Patient Instructions (Signed)
Health Maintenance After Age 70 After age 70, you are at a higher risk for certain long-term diseases and infections as well as injuries from falls. Falls are a major cause of broken bones and head injuries in people who are older than age 70. Getting regular preventive care can help to keep you healthy and well. Preventive care includes getting regular testing and making lifestyle changes as recommended by your health care provider. Talk with your health care provider about:  Which screenings and tests you should have. A screening is a test that checks for a disease when you have no symptoms.  A diet and exercise plan that is right for you. What should I know about screenings and tests to prevent falls? Screening and testing are the best ways to find a health problem early. Early diagnosis and treatment give you the best chance of managing medical conditions that are common after age 70. Certain conditions and lifestyle choices may make you more likely to have a fall. Your health care provider may recommend:  Regular vision checks. Poor vision and conditions such as cataracts can make you more likely to have a fall. If you wear glasses, make sure to get your prescription updated if your vision changes.  Medicine review. Work with your health care provider to regularly review all of the medicines you are taking, including over-the-counter medicines. Ask your health care provider about any side effects that may make you more likely to have a fall. Tell your health care provider if any medicines that you take make you feel dizzy or sleepy.  Osteoporosis screening. Osteoporosis is a condition that causes the bones to get weaker. This can make the bones weak and cause them to break more easily.  Blood pressure screening. Blood pressure changes and medicines to control blood pressure can make you feel dizzy.  Strength and balance checks. Your health care provider may recommend certain tests to check your  strength and balance while standing, walking, or changing positions.  Foot health exam. Foot pain and numbness, as well as not wearing proper footwear, can make you more likely to have a fall.  Depression screening. You may be more likely to have a fall if you have a fear of falling, feel emotionally low, or feel unable to do activities that you used to do.  Alcohol use screening. Using too much alcohol can affect your balance and may make you more likely to have a fall. What actions can I take to lower my risk of falls? General instructions  Talk with your health care provider about your risks for falling. Tell your health care provider if: ? You fall. Be sure to tell your health care provider about all falls, even ones that seem minor. ? You feel dizzy, sleepy, or off-balance.  Take over-the-counter and prescription medicines only as told by your health care provider. These include any supplements.  Eat a healthy diet and maintain a healthy weight. A healthy diet includes low-fat dairy products, low-fat (lean) meats, and fiber from whole grains, beans, and lots of fruits and vegetables. Home safety  Remove any tripping hazards, such as rugs, cords, and clutter.  Install safety equipment such as grab bars in bathrooms and safety rails on stairs.  Keep rooms and walkways well-lit. Activity  Follow a regular exercise program to stay fit. This will help you maintain your balance. Ask your health care provider what types of exercise are appropriate for you.  If you need a cane or walker,   use it as recommended by your health care provider.  Wear supportive shoes that have nonskid soles.   Lifestyle  Do not drink alcohol if your health care provider tells you not to drink.  If you drink alcohol, limit how much you have: ? 0-1 drink a day for women. ? 0-2 drinks a day for men.  Be aware of how much alcohol is in your drink. In the [place] one drink equals one typical bottle of beer (12  oz), one-half glass of wine (5 oz), or one shot of hard liquor (1 oz).  Do not use any products that contain nicotine or tobacco, such as cigarettes and e-cigarettes. If you need help quitting, ask your health care provider. Summary  Having a healthy lifestyle and getting preventive care can help to protect your health and wellness after age 70.  Screening and testing are the best way to find a health problem early and help you avoid having a fall. Early diagnosis and treatment give you the best chance for managing medical conditions that are more common for people who are older than age 70.  Falls are a major cause of broken bones and head injuries in people who are older than age 70. Take precautions to prevent a fall at home.  Work with your health care provider to learn what changes you can make to improve your health and wellness and to prevent falls. This information is not intended to replace advice given to you by your health care provider. Make sure you discuss any questions you have with your health care provider. Document Revised: 02/21/2019 Document Reviewed: 09/13/2017 Elsevier Patient Education  2021 Elsevier Inc.  

## 2021-04-13 ENCOUNTER — Encounter: Payer: Self-pay | Admitting: Internal Medicine

## 2021-04-13 DIAGNOSIS — H40023 Open angle with borderline findings, high risk, bilateral: Secondary | ICD-10-CM | POA: Diagnosis not present

## 2021-04-13 DIAGNOSIS — H1045 Other chronic allergic conjunctivitis: Secondary | ICD-10-CM | POA: Diagnosis not present

## 2021-04-13 DIAGNOSIS — H34212 Partial retinal artery occlusion, left eye: Secondary | ICD-10-CM | POA: Diagnosis not present

## 2021-04-13 DIAGNOSIS — H40033 Anatomical narrow angle, bilateral: Secondary | ICD-10-CM | POA: Diagnosis not present

## 2021-04-20 DIAGNOSIS — S93492A Sprain of other ligament of left ankle, initial encounter: Secondary | ICD-10-CM | POA: Diagnosis not present

## 2021-04-20 DIAGNOSIS — M25572 Pain in left ankle and joints of left foot: Secondary | ICD-10-CM | POA: Insufficient documentation

## 2021-04-20 DIAGNOSIS — S86012A Strain of left Achilles tendon, initial encounter: Secondary | ICD-10-CM | POA: Diagnosis not present

## 2021-04-20 HISTORY — DX: Pain in left ankle and joints of left foot: M25.572

## 2021-04-23 ENCOUNTER — Other Ambulatory Visit (HOSPITAL_BASED_OUTPATIENT_CLINIC_OR_DEPARTMENT_OTHER): Payer: Self-pay

## 2021-04-23 ENCOUNTER — Other Ambulatory Visit: Payer: Self-pay

## 2021-04-23 ENCOUNTER — Ambulatory Visit: Payer: Medicare HMO | Attending: Internal Medicine

## 2021-04-23 DIAGNOSIS — Z23 Encounter for immunization: Secondary | ICD-10-CM

## 2021-04-23 MED ORDER — PFIZER-BIONT COVID-19 VAC-TRIS 30 MCG/0.3ML IM SUSP
INTRAMUSCULAR | 0 refills | Status: DC
  Filled 2021-04-23: qty 0.3, 1d supply, fill #0

## 2021-04-23 NOTE — Progress Notes (Signed)
   Covid-19 Vaccination Clinic  Name:  Debra Woodward    MRN: [id number] DOB: 09-30-1951  04/23/2021  [name] was observed post Covid-19 immunization for 15 minutes without incident. She was provided with Vaccine Information Sheet and instruction to access the V-Safe system.   [name] was instructed to call 911 with any severe reactions post vaccine: Difficulty breathing  Swelling of face and throat  A fast heartbeat  A bad rash all over body  Dizziness and weakness   Immunizations Administered     Name Date Dose VIS Date Route   PFIZER Comrnaty(Gray TOP) Covid-19 Vaccine 04/23/2021  1:55 PM 0.3 mL 10/22/2020 Intramuscular   Manufacturer: [place]   Lot: [id number]   [place]: [phone]

## 2021-05-04 ENCOUNTER — Telehealth: Payer: Self-pay | Admitting: Pharmacist

## 2021-05-04 NOTE — Chronic Care Management (AMB) (Signed)
    Chronic Care Management Pharmacy Assistant   Name: Debra Woodward  MRN: [id number] DOB: 29-Jan-1951  Reason for Encounter:    Recent office visits:  None  Recent consult visits:  05.09.2022 [name], NP Gynecology: patient seen for well women exam  Hospital visits:  None in previous 6 months  Medications: Outpatient Encounter Medications as of 05/04/2021  Medication Sig   Ascorbic Acid (VITAMIN C) 500 MG CAPS Take 1 capsule by mouth daily.   cetirizine (ZYRTEC) 10 MG tablet Take 10 mg by mouth daily. Patient alternates with Allegra   COVID-19 mRNA Vac-TriS, Pfizer, (PFIZER-BIONT COVID-19 VAC-TRIS) SUSP injection Inject into the muscle.   diclofenac sodium (VOLTAREN) 1 % GEL Apply 2 g topically 4 (four) times daily.   docusate sodium (COLACE) 100 MG capsule Take 1 capsule (100 mg total) by mouth every 12 (twelve) hours.   fexofenadine (ALLEGRA) 60 MG tablet Take 60 mg by mouth daily. Patient alternates with Zyrtec   fluticasone (FLONASE) 50 MCG/ACT nasal spray USE 2 SPRAY(S) IN EACH NOSTRIL ONCE DAILY AT  NIGHT   Glycerin-Polysorbate 80 (REFRESH DRY EYE THERAPY OP) Apply to eye.   Multiple Vitamin (MULTIVITAMIN) tablet Take 1 tablet by mouth daily.   NONFORMULARY OR COMPOUNDED ITEM Apply 1-2 g topically daily. [facility]: Nail laquer   NONFORMULARY OR COMPOUNDED ITEM Antifungal solution: Terbinafine 3%, Fluconazole 2%, Tea Tree Oil 5%, Urea 10%, Ibuprofen 2% in DMSO suspension #42mL   triamcinolone ointment (KENALOG) 0.1 % Apply small amt twice per day x 14 days then twice weekly   VITAMIN D PO Take 5,000 Int'l Units by mouth every other day.   No facility-administered encounter medications on file as of 05/04/2021.   I spoke with the patient about medication adherence. She states that she has been doing well. She does not have any current issues with her medications. She is not experiencing any side effects. The patient was originally scheduled for June 22nd for  a CCM appointment, and it was moved to August 17th, 2022, at 9:00 AM via telephone. She states that she has not started any new medications. She continues to take her medications as prescribed. There have been no emergency department or urgent care visits since her last CPP or PCP visit. She does not have any issues with her pharmacy. The patient also wanted to schedule follow-up appointments with her primary care doctor. Lab work for her annual physical was scheduled for November the 2nd 2022 at 8:00 AM and a physical with her PCP was scheduled for November the 9th at 11 AM.   Star Rating Drugs: None  Amilia ([place]) Mare Ferrari, [place] Pharmacist Assistant [phone]

## 2021-05-05 ENCOUNTER — Ambulatory Visit (INDEPENDENT_AMBULATORY_CARE_PROVIDER_SITE_OTHER): Payer: Medicare HMO | Admitting: Pharmacist

## 2021-05-05 DIAGNOSIS — M1990 Unspecified osteoarthritis, unspecified site: Secondary | ICD-10-CM | POA: Diagnosis not present

## 2021-05-05 DIAGNOSIS — R030 Elevated blood-pressure reading, without diagnosis of hypertension: Secondary | ICD-10-CM

## 2021-05-05 DIAGNOSIS — J309 Allergic rhinitis, unspecified: Secondary | ICD-10-CM

## 2021-05-05 DIAGNOSIS — R7303 Prediabetes: Secondary | ICD-10-CM

## 2021-05-05 DIAGNOSIS — Z720 Tobacco use: Secondary | ICD-10-CM

## 2021-05-05 DIAGNOSIS — M170 Bilateral primary osteoarthritis of knee: Secondary | ICD-10-CM

## 2021-05-05 DIAGNOSIS — F17200 Nicotine dependence, unspecified, uncomplicated: Secondary | ICD-10-CM

## 2021-05-05 NOTE — Progress Notes (Signed)
Chronic Care Management Pharmacy Note  05/05/2021 Name:  Debra Woodward MRN:  [id number] DOB:  09-22-1951  Summary: Patient continues to smoke  Recommendations/Changes made from today's visit: -Recommended for patient to call quit line and provided phone number for supplies (lozenges) -Recommended getting a bag and separating out 4 cigarettes per day and slowly tapering off by 1 cigarette per month  Plan: -Follow up in 3 months  Subjective: Debra Woodward is an 70 y.o. year old female who is a primary patient of Panosh, [name], MD.  The CCM team was consulted for assistance with disease management and care coordination needs.    Engaged with patient by telephone for follow up visit in response to provider referral for pharmacy case management and/or care coordination services.   Consent to Services:  The patient was given information about Chronic Care Management services, agreed to services, and gave verbal consent prior to initiation of services.  Please see initial visit note for detailed documentation.   Patient Care Team: Panosh, [name], MD as PCP - General (Internal Medicine) Huel Cote, NP (Inactive) as Nurse Practitioner (Obstetrics and Gynecology) Viona Gilmore, [facility] as Pharmacist (Pharmacist)  Recent office visits: 09/23/20 Debra Ace, MD: Patient presented for annual exam. A1c decreased from last visit but still elevated. BP elevated in office.   06/22/20 Ofilia Neas, LPN: [name]  Recent consult visits: 02/24/21 Podiatry Gardiner Barefoot, DPM: presented for onychomycosis for both feet. No medication changes.  02/10/21 Otolaryngolo Rozetta Nunnery, MD: presented for hearing loss in both ears/Bilateral impacted cerumen. Per note: The doctor suggested an hearing test be done and to follow up in the future to get her ears cleaned out No medication changes.  11/25/20 Acquanetta Sit, DPM  (podiatry): Patient presented for nail trim.  11/23/20 Karma Ganja, NP (GYN): Patient presented for vaginitis. Recommended vaseline first and then triamcinolone.   08/25/20 Marny Lowenstein, NP (OBGYN): Patient presented for vaginal discharge. Wet prep was unremarkable with normal pelvic exam.   08/21/20 Acquanetta Sit, DPM (podiatry): Patient presented for toe nail removal.  Hospital visits: None in previous 6 months  Objective:  Lab Results  Component Value Date   CREATININE 0.74 09/23/2020   BUN 9 09/23/2020   GFR 105.91 09/23/2019   GFRNONAA 83 09/23/2020   GFRAA 96 09/23/2020   NA 140 09/23/2020   K 4.6 09/23/2020   CALCIUM 10.4 09/23/2020   CO2 27 09/23/2020   GLUCOSE 111 (H) 09/23/2020    Lab Results  Component Value Date/Time   HGBA1C 6.2 (H) 09/23/2020 12:06 PM   HGBA1C 6.4 09/23/2019 04:28 PM   GFR 105.91 09/23/2019 04:28 PM   GFR 99.13 09/18/2018 11:45 AM    Last diabetic Eye exam: No results found for: HMDIABEYEEXA  Last diabetic Foot exam: No results found for: HMDIABFOOTEX   Lab Results  Component Value Date   CHOL 153 09/23/2020   HDL 67 09/23/2020   LDLCALC 70 09/23/2020   TRIG 81 09/23/2020   CHOLHDL 2.3 09/23/2020    Hepatic Function Latest Ref Rng & Units 09/23/2020 06/26/2019 03/08/2017  Total Protein 6.1 - 8.1 g/dL 7.0 7.7 6.5  Albumin 3.5 - 5.0 g/dL - 4.0 4.1  AST 10 - 35 U/L '16 27 14  ' ALT 6 - 29 U/L 13 44 13  Alk Phosphatase 38 - 126 U/L - 73 78  Total Bilirubin 0.2 - 1.2 mg/dL 0.6 0.4 0.5  Bilirubin, Direct 0.0 -  0.2 mg/dL 0.1 - 0.1    Lab Results  Component Value Date/Time   TSH 2.41 09/23/2019 04:28 PM   TSH 1.74 03/08/2017 08:04 AM    CBC Latest Ref Rng & Units 09/23/2020 09/23/2019 06/26/2019  WBC 3.8 - 10.8 Thousand/uL 5.6 6.5 8.1  Hemoglobin 11.7 - 15.5 g/dL 14.3 12.9 13.1  Hematocrit 35.0 - 45.0 % 41.1 39.0 40.2  Platelets 140 - 400 Thousand/uL 408(H) 371.0 439(H)    Lab Results  Component Value Date/Time   VD25OH 22.44  (L) 03/11/2016 08:58 AM   VD25OH 23 (L) 03/10/2015 04:27 PM    Clinical ASCVD: No  The 10-year ASCVD risk score Mikey Bussing DC Jr., et al., 2013) is: 14.1%   Values used to calculate the score:     Age: 45 years     Sex: Female     Is Non-Hispanic African American: Yes     Diabetic: No     Tobacco smoker: Yes     Systolic Blood Pressure: 270 mmHg     Is BP treated: No     HDL Cholesterol: 67 mg/dL     Total Cholesterol: 153 mg/dL    Depression screen [facility] 2/9 06/22/2020 09/23/2019 08/22/2018  Decreased Interest 0 0 0  Down, Depressed, Hopeless 0 0 0  PHQ - 2 Score 0 0 0  Altered sleeping 0 - -  Tired, decreased energy 0 - -  Change in appetite 0 - -  Feeling bad or failure about yourself  0 - -  Trouble concentrating 0 - -  Moving slowly or fidgety/restless 0 - -  Suicidal thoughts 0 - -  PHQ-9 Score 0 - -  Difficult doing work/chores Not difficult at all - -  Some recent data might be hidden      Social History   Tobacco Use  Smoking Status Every Day   Pack years: 0.00   Types: Cigarettes  Smokeless Tobacco Never   BP Readings from Last 3 Encounters:  03/22/21 118/70  11/23/20 118/78  09/23/20 (!) 150/88   Pulse Readings from Last 3 Encounters:  03/22/21 76  11/23/20 70  09/23/20 80   Wt Readings from Last 3 Encounters:  03/22/21 179 lb (81.2 kg)  11/23/20 182 lb (82.6 kg)  09/23/20 182 lb 6.4 oz (82.7 kg)   BMI Readings from Last 3 Encounters:  03/22/21 30.73 kg/m  11/23/20 30.29 kg/m  09/23/20 30.35 kg/m    Assessment/Interventions: Review of patient past medical history, allergies, medications, health status, including review of consultants reports, laboratory and other test data, was performed as part of comprehensive evaluation and provision of chronic care management services.   SDOH:  (Social Determinants of Health) assessments and interventions performed: No   CCM Care Plan  Allergies  Allergen Reactions   Bee Venom Hives   Prednisone Other  (See Comments)    hallucinations    Medications Reviewed Today     Reviewed by [name], NP (Nurse Practitioner) on 03/22/21 at [place] List Status: <None>   Medication Order Taking? Sig Documenting Provider Last Dose Status Informant  Ascorbic Acid (VITAMIN C) 500 MG CAPS [id number] Yes Take 1 capsule by mouth daily. [provider] Taking Active   cetirizine (ZYRTEC) 10 MG tablet [id number] Yes Take 10 mg by mouth daily. Patient alternates with Allegra [provider] Taking Active   diclofenac sodium (VOLTAREN) 1 % GEL [id number] Yes Apply 2 g topically 4 (four) times daily. [provider] Taking Active  docusate sodium (COLACE) 100 MG capsule [id number] Yes Take 1 capsule (100 mg total) by mouth every 12 (twelve) hours. Delia Heady, PA-C Taking Active   fexofenadine (ALLEGRA) 60 MG tablet [id number] Yes Take 60 mg by mouth daily. Patient alternates with Zyrtec [provider] Taking Active Self  fluticasone (FLONASE) 50 MCG/ACT nasal spray [id number] Yes USE 2 SPRAY(S) IN EACH NOSTRIL ONCE DAILY AT  Elie Confer, MD Taking Active   Glycerin-Polysorbate 80 [facility] DRY EYE THERAPY OP) [id number] Yes Apply to eye. [provider] Taking Active   Multiple Vitamin (MULTIVITAMIN) tablet [id number] Yes Take 1 tablet by mouth daily. [provider] Taking Active Self  NONFORMULARY OR COMPOUNDED ITEM [id number] Yes Apply 1-2 g topically daily. [place] [facility]: Nail laquer Marzetta Board, DPM Taking Active Self  NONFORMULARY OR COMPOUNDED ITEM [id number] Yes Antifungal solution: Terbinafine 3%, Fluconazole 2%, Tea Tree Oil 5%, Urea 10%, Ibuprofen 2% in DMSO suspension #71m GMarzetta Board DPM Taking Active   triamcinolone ointment (KENALOG) 0.1 % [id number] Apply small amt twice per day x 14 days then twice weekly DKarma Ganja NP Taking Active   VITAMIN D PO [id number] Take 5,000 Int'l Units by mouth every  other day. [provider] Taking Active             Patient Active Problem List   Diagnosis Date Noted   Arthritis of both knees 11/15/2017   Aftercare 11/15/2017   Hx of adenomatous colonic polyps 07/28/2015   Degenerative disc disease, lumbar sacral 11/13/2012   Elevated blood pressure reading 11/13/2012   Allergic rhinitis 09/14/2006    Immunization History  Administered Date(s) Administered   Hep A / Hep B 01/25/2018, 02/27/2018, 08/10/2018   Influenza Split 08/07/2012, 08/23/2014   Influenza Whole 08/14/2001, 09/10/2007, 08/16/2010, 08/15/2011   Influenza, High Dose Seasonal PF 08/23/2017, 08/10/2018, 09/02/2019, 10/01/2020   Influenza,inj,Quad PF,6+ Mos 08/16/2016   Influenza-Unspecified 08/14/2013, 08/14/2017   PFIZER Comirnaty(Gray Top)Covid-19 Tri-Sucrose Vaccine 04/23/2021   PFIZER(Purple Top)SARS-COV-2 Vaccination 12/03/2019, 12/25/2019, 08/21/2020   Pneumococcal Conjugate-13 09/30/2016   Pneumococcal Polysaccharide-23 11/15/2011, 09/18/2018   Pneumococcal-Unspecified 07/15/2016   Td 03/13/2009   Tdap 01/25/2018   Zoster Recombinat (Shingrix) 03/22/2017, 12/11/2017   Zoster, Live 05/28/2012    Conditions to be addressed/monitored:  Osteoarthritis, Tobacco use, Allergic Rhinitis and elevated blood pressure and pre-diabetes  Care Plan : [place] Updates made by PViona Gilmore [place] 05/05/2021 12:00 AM     Problem: Problem: Elevated blood pressure, tobacco use, allergic rhinitis, arthritis, prediabetes      Long-Range Goal: Patient-Specific Goal   Start Date: 02/02/2021  Expected End Date: 02/02/2022  Recent Progress: On track  Priority: High  Note:   Current Barriers:  Unable to independently monitor therapeutic efficacy  Pharmacist Clinical Goal(s):  Patient will achieve adherence to monitoring guidelines and medication adherence to achieve therapeutic efficacy through collaboration with PharmD and provider.     Interventions: 1:1 collaboration with Panosh, WStandley Brooking MD regarding development and update of comprehensive plan of care as evidenced by provider attestation and co-signature Inter-disciplinary care team collaboration (see longitudinal plan of care) Comprehensive medication review performed; medication list updated in electronic medical record  Elevated blood pressure (BP goal <130/80) -Controlled -Current treatment: No medications -Medications previously tried: none  -Current home readings: not checking regularly -Current dietary habits: does not cook with salt and doesn't eat red meat; eats mostly seafood, chicken or [place] eats lots of vegetables and some fruit -Current  exercise habits: limited with taking care of her sister -Denies hypotensive/hypertensive symptoms -Educated on Daily salt intake goal < 2300 mg; Exercise goal of 150 minutes per week; Importance of home blood pressure monitoring; -Counseled to monitor BP at home as directed, document, and provide log at future appointments -Counseled on diet and exercise extensively  Pre-diabetes (A1c goal <6.5%) -Controlled -Current medications: No medications -Medications previously tried: none  -Current home glucose readings fasting glucose: does not check post prandial glucose: does not check -Denies hypoglycemic/hyperglycemic symptoms -Current meal patterns:  breakfast: n/a  lunch: n/a  dinner: n/a snacks: n/a drinks: n/a -Current exercise: n/a -Educated on A1c and blood sugar goals; Exercise goal of 150 minutes per week; Carbohydrate counting and/or plate method -Counseled to check feet daily and get yearly eye exams -Counseled on diet and exercise extensively  Tobacco use (Goal quit smoking) -Uncontrolled -Previous quit attempts: patches (too much nicotine), gum (flavor), quit cold [place] for about 6 months (6-7 years ago)  -Current treatment  No medications -Patient smokes After 30 minutes of  waking -Patient triggers include:  habit and with breakfast -On a scale of 1-10, reports MOTIVATION to quit is 8 -On a scale of 1-10, reports CONFIDENCE in quitting is 8 -Counseled to allow lozenge to dissolve and absorb in cheek pocket, rather than swallow, to reduce GI side effects. Provided contact information for [place] Quit Line (1-800-QUIT-NOW) and encouraged patient to reach out to this group for support. -Counseled on using distractions to stop herself from picking up a cigarette and putting chewing gum on top of her cigarettes in her bag to give an added layer of distraction Recommended setting a goal of 1 less cigarette per day per month to see if she can slowly taper off and setting them aside in a plastic bag for each day. Educated on using the 2 mg dose of nicotine lozenges if she decides to start  Allergic rhinitis (Goal: minimize symptoms) -Controlled -Current treatment  fexofenadine (Allegra) 68m, 1 tablet once daily  cetirizine (Zyrtec) 171m 1 tablet once daily (alternates antihistamines- does not take together)   fluticasone (Flonase) 5038m act nasal spray, 2 sprays into both nostrils every night  -Medications previously tried: none  -Recommended to continue current medication Patient has been going to ENT every 3-4 months.  Arthritis (Goal: minimize pain) -Controlled -Current treatment  diclofenac 1% gel, apply 2 grams four times daily (uses as needed) -Medications previously tried: none  -Recommended to continue current medication   Health Maintenance -Vaccine gaps: none -Current therapy:  Docusate 100 mg 1 capsule every 12 hours Refresh dry eye as needed Multivitamin 1 tablet daily Vitamin C 500 mg 1 capsule daily Antifungal solution apply as needed to toenails Triamcinolone 0.1% ointment apply twice weekly Vitamin D 5000 every other day -Educated on Cost vs benefit of each product must be carefully weighed by individual consumer -Patient is satisfied with  current therapy and denies issues -Recommended to continue current medication  Patient Goals/Self-Care Activities Patient will:  - take medications as prescribed check blood pressure as directed, document, and provide at future appointments target a minimum of 150 minutes of moderate intensity exercise weekly  Follow Up Plan: Telephone follow up appointment with care management team member scheduled for: 3 month       Medication Assistance: None required.  Patient affirms current coverage meets needs.  Compliance/Adherence/Medication fill history: Care Gaps: None  Star-Rating Drugs: None  Patient's [facility] pharmacy is:  [facility] [place]  [place] [address] Phone: [phone] Fax: [phone]  [facility] - [place], [address] [place] [address] Phone: [phone] Fax: [phone]  Uses pill box? No - puts all of her medicines on her dresser Pt endorses 95% compliance  We discussed: Current pharmacy is preferred with insurance plan and patient is satisfied with pharmacy services Patient decided to: Continue current medication management strategy  Care Plan and Follow Up Patient Decision:  Patient agrees to Care Plan and Follow-up.  Plan: Telephone follow up appointment with care management team member scheduled for:  3 months  Jeni Salles, PharmD [place] Pharmacist [place] [facility] [place] [phone]

## 2021-05-05 NOTE — Patient Instructions (Signed)
Hi Carmina,  It was great to speak with you again! Try calling the quit line as we discussed because they may be able to provide you with some lozenges for free. Keep up the good work with using distractions to your advantage and chewing sugarless gum and carrots as often as possible.  Please reach out to me if you have any questions or need anything before our follow up!  Best, Maddie  Jeni Salles, PharmD, [facility]   Visit Information   Goals Addressed   None    Patient Care Plan: CCM Pharmacy Care Plan     Problem Identified: Problem: Elevated blood pressure, tobacco use, allergic rhinitis, arthritis, prediabetes      Long-Range Goal: Patient-Specific Goal   Start Date: 02/02/2021  Expected End Date: 02/02/2022  Recent Progress: On track  Priority: High  Note:   Current Barriers:  Unable to independently monitor therapeutic efficacy  Pharmacist Clinical Goal(s):  Patient will achieve adherence to monitoring guidelines and medication adherence to achieve therapeutic efficacy through collaboration with PharmD and provider.    Interventions: 1:1 collaboration with Panosh, [name], MD regarding development and update of comprehensive plan of care as evidenced by provider attestation and co-signature Inter-disciplinary care team collaboration (see longitudinal plan of care) Comprehensive medication review performed; medication list updated in electronic medical record  Elevated blood pressure (BP goal <130/80) -Controlled -Current treatment: No medications -Medications previously tried: none  -Current home readings: not checking regularly -Current dietary habits: does not cook with salt and doesn't eat red meat; eats mostly seafood, chicken or [place]; eats lots of vegetables and some fruit -Current exercise habits: limited with taking care of her sister -Denies hypotensive/hypertensive  symptoms -Educated on Daily salt intake goal < 2300 mg; Exercise goal of 150 minutes per week; Importance of home blood pressure monitoring; -Counseled to monitor BP at home as directed, document, and provide log at future appointments -Counseled on diet and exercise extensively  Pre-diabetes (A1c goal <6.5%) -Controlled -Current medications: No medications -Medications previously tried: none  -Current home glucose readings fasting glucose: does not check post prandial glucose: does not check -Denies hypoglycemic/hyperglycemic symptoms -Current meal patterns:  breakfast: n/a  lunch: n/a  dinner: n/a snacks: n/a drinks: n/a -Current exercise: n/a -Educated on A1c and blood sugar goals; Exercise goal of 150 minutes per week; Carbohydrate counting and/or plate method -Counseled to check feet daily and get yearly eye exams -Counseled on diet and exercise extensively  Tobacco use (Goal quit smoking) -Uncontrolled -Previous quit attempts: patches (too much nicotine), gum (flavor), quit cold [place] for about 6 months (6-7 years ago)  -Current treatment  No medications -Patient smokes After 30 minutes of waking -Patient triggers include:  habit and with breakfast -On a scale of 1-10, reports MOTIVATION to quit is 8 -On a scale of 1-10, reports CONFIDENCE in quitting is 8 -Counseled to allow lozenge to dissolve and absorb in cheek pocket, rather than swallow, to reduce GI side effects. Provided contact information for [place] [facility]uit Line (1-800-QUIT-NOW) and encouraged patient to reach out to this group for support. -Counseled on using distractions to stop herself from picking up a cigarette and putting chewing gum on top of her cigarettes in her bag to give an added layer of distraction Recommended setting a goal of 1 less cigarette per day per month to see if she can slowly taper off and setting them aside in a plastic bag for each day. Educated on using  the 2 mg dose of nicotine lozenges  if she decides to start  Allergic rhinitis (Goal: minimize symptoms) -Controlled -Current treatment  fexofenadine (Allegra) 60mg , 1 tablet once daily  cetirizine (Zyrtec) 10mg , 1 tablet once daily (alternates antihistamines- does not take together)   fluticasone (Flonase) 58mcg/ act nasal spray, 2 sprays into both nostrils every night  -Medications previously tried: none  -Recommended to continue current medication Patient has been going to ENT every 3-4 months.  Arthritis (Goal: minimize pain) -Controlled -Current treatment  diclofenac 1% gel, apply 2 grams four times daily (uses as needed) -Medications previously tried: none  -Recommended to continue current medication   Health Maintenance -Vaccine gaps: none -Current therapy:  Docusate 100 mg 1 capsule every 12 hours Refresh dry eye as needed Multivitamin 1 tablet daily Vitamin C 500 mg 1 capsule daily Antifungal solution apply as needed to toenails Triamcinolone 0.1% ointment apply twice weekly Vitamin D 5000 every other day -Educated on Cost vs benefit of each product must be carefully weighed by individual consumer -Patient is satisfied with current therapy and denies issues -Recommended to continue current medication  Patient Goals/Self-Care Activities Patient will:  - take medications as prescribed check blood pressure as directed, document, and provide at future appointments target a minimum of 150 minutes of moderate intensity exercise weekly  Follow Up Plan: Telephone follow up appointment with care management team member scheduled for: 3 month       Patient verbalizes understanding of instructions provided today and agrees to view in [place].  Telephone follow up appointment with pharmacy team member scheduled for: 3 months  Viona Gilmore, [facility]

## 2021-05-10 ENCOUNTER — Ambulatory Visit (INDEPENDENT_AMBULATORY_CARE_PROVIDER_SITE_OTHER): Payer: Medicare HMO | Admitting: Otolaryngology

## 2021-05-10 ENCOUNTER — Other Ambulatory Visit: Payer: Self-pay

## 2021-05-10 DIAGNOSIS — H6123 Impacted cerumen, bilateral: Secondary | ICD-10-CM | POA: Diagnosis not present

## 2021-05-10 DIAGNOSIS — H903 Sensorineural hearing loss, bilateral: Secondary | ICD-10-CM

## 2021-05-10 NOTE — Progress Notes (Signed)
HPI: Debra Woodward is a 70 y.o. female who returns today for evaluation of hearing.  She underwent audiologic testing today that demonstrated a mild bilateral symmetric sensorineural hearing loss in both ears which is downsloping in the upper frequencies slightly worse on the left side versus the right.  However SRT's were 25 DB in both ears.  She was noted to have some wax buildup in her ears again.  She was last cleaned about 3 months ago..  Past Medical History:  Diagnosis Date   ALLERGIC RHINITIS 09/14/2006   Allergy    Fibroid    Hx of adenomatous colonic polyps 07/28/2015   Seasonal allergies    Past Surgical History:  Procedure Laterality Date   ABDOMINAL HYSTERECTOMY  1996   BREAST EXCISIONAL BIOPSY Left 1983   No scar seen   BREAST EXCISIONAL BIOPSY Right 1987   NO scar seen   BREAST SURGERY Bilateral 1983, 1996   benign   CARPAL TUNNEL RELEASE Right 2008   COLONOSCOPY  07/23/2015   CYSTECTOMY Bilateral 1983, 1988   breast; benign   KNEE ARTHROSCOPY Right 2010   TOE SURGERY Bilateral    TOTAL KNEE ARTHROPLASTY Right 2011   Social History   Socioeconomic History   Marital status: Single    Spouse name: Not on file   Number of children: Not on file   Years of education: Not on file   Highest education level: Not on file  Occupational History   Not on file  Tobacco Use   Smoking status: Every Day    Pack years: 0.00    Types: Cigarettes   Smokeless tobacco: Never  Vaping Use   Vaping Use: Never used  Substance and Sexual Activity   Alcohol use: Yes    Alcohol/week: 2.0 standard drinks    Types: 2 Glasses of wine per week    Comment: social   Drug use: No   Sexual activity: Yes    Birth control/protection: Surgical    Comment: HYST  Other Topics Concern   Not on file  Social History Narrative   Going to [facility] of 1   No pets       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    Social Determinants of Health   Financial Resource Strain: Low Risk     Difficulty of Paying Living Expenses: Not hard at all  Food Insecurity: Not on file  Transportation Needs: No Transportation Needs   Lack of Transportation (Medical): No   Lack of Transportation (Non-Medical): No  Physical Activity: Sufficiently Active   Days of Exercise per Week: 4 days   Minutes of Exercise per Session: 60 min  Stress: No Stress Concern Present   Feeling of Stress : Not at all  Social Connections: Moderately Integrated   Frequency of Communication with Friends and Family: More than three times a week   Frequency of Social Gatherings with Friends and Family: Once a week   Attends Religious Services: More than 4 times per year   Active Member of [employer] or Organizations: Yes   Attends Music therapist: More than 4 times per year   Marital Status: Divorced   Family History  Problem Relation Age of Onset   Stroke Mother    Diabetes Mother    Diabetes Father    Hypertension Sister    Esophageal cancer Paternal Grandfather 70   Colon cancer Neg Hx    Stomach cancer Neg Hx  Rectal cancer Neg Hx    Breast cancer Neg Hx    Colon polyps Neg Hx    Allergies  Allergen Reactions   Bee Venom Hives   Prednisone Other (See Comments)    hallucinations   Prior to Admission medications   Medication Sig Start Date End Date Taking? Authorizing Provider  Ascorbic Acid (VITAMIN C) 500 MG CAPS Take 1 capsule by mouth daily.    [provider]  cetirizine (ZYRTEC) 10 MG tablet Take 10 mg by mouth daily. Patient alternates with Allegra    [provider]  COVID-19 mRNA Vac-TriS, Pfizer, (PFIZER-BIONT COVID-19 VAC-TRIS) SUSP injection Inject into the muscle. 04/23/21   Carlyle Basques, MD  diclofenac sodium (VOLTAREN) 1 % GEL Apply 2 g topically 4 (four) times daily. 01/16/19   [provider]  docusate sodium (COLACE) 100 MG capsule Take 1 capsule (100 mg total) by mouth every 12 (twelve) hours. 06/26/19   Khatri, Hina, PA-C  fexofenadine  (ALLEGRA) 60 MG tablet Take 60 mg by mouth daily. Patient alternates with Zyrtec    [provider]  fluticasone (FLONASE) 50 MCG/ACT nasal spray USE 2 SPRAY(S) IN [facility] NOSTRIL ONCE DAILY AT  NIGHT 01/15/21   Rozetta Nunnery, MD  Glycerin-Polysorbate 80 (REFRESH DRY EYE THERAPY OP) Apply to eye.    [provider]  Multiple Vitamin (MULTIVITAMIN) tablet Take 1 tablet by mouth daily.    [provider]  NONFORMULARY OR COMPOUNDED ITEM Apply 1-2 g topically daily. [facility]: Nail laquer 02/12/19   Marzetta Board, DPM  NONFORMULARY OR COMPOUNDED ITEM Antifungal solution: Terbinafine 3%, Fluconazole 2%, Tea Tree Oil 5%, Urea 10%, Ibuprofen 2% in DMSO suspension #35mL 08/24/20   [place], Stephani Police, DPM  triamcinolone ointment (KENALOG) 0.1 % Apply small amt twice per day x 14 days then twice weekly 11/23/20   Karma Ganja, NP  VITAMIN D PO Take 5,000 Int'l Units by mouth every other day.    [provider]     Positive ROS: Otherwise negative  All other systems have been reviewed and were otherwise negative with the exception of those mentioned in the HPI and as above.  Physical Exam: Constitutional: Alert, well-appearing, no acute distress Ears: External ears without lesions or tenderness. Ear canals with moderate wax buildup that was cleaned with curettes.  She has slightly small ear canals.  TMs are clear bilaterally with no middle ear effusion noted. Nasal: External nose without lesions. Septum mild deformity and mild rhinitis.. Clear nasal passages otherwise. Oral: Lips and gums without lesions. Tongue and palate mucosa without lesions. Posterior oropharynx clear. Neck: No palpable adenopathy or masses Respiratory: Breathing comfortably  Skin: No facial/neck lesions or rash noted.  Cerumen impaction removal  Date/Time: 05/10/2021 1:37 PM Performed by: Rozetta Nunnery, MD Authorized by: Rozetta Nunnery, MD   Consent:     Consent obtained:  Verbal   Consent given by:  Patient   Risks discussed:  Pain and bleeding Procedure details:    Location:  L ear and R ear   Procedure type: curette   Post-procedure details:    Inspection:  TM intact and canal normal   Hearing quality:  Improved   Procedure completion:  Tolerated well, no immediate complications Comments:     Both ear canals were cleaned with a curette.  TMs were clear otherwise bilaterally.    Assessment: Mild bilateral upper frequency SNHL in both ears. Cerumen build up  Plan: Ears were cleaned She will follow  up prn She's not ready for hearing aids yet.    Radene Journey, MD

## 2021-05-20 NOTE — Telephone Encounter (Signed)
Make appt to discuss Can do an on line  self assessment  screen called SAGE  a home test  and see how you do  .before you come.

## 2021-05-31 ENCOUNTER — Encounter: Payer: Self-pay | Admitting: Podiatry

## 2021-05-31 ENCOUNTER — Ambulatory Visit: Payer: Medicare HMO | Admitting: Podiatry

## 2021-05-31 ENCOUNTER — Other Ambulatory Visit: Payer: Self-pay

## 2021-05-31 DIAGNOSIS — B351 Tinea unguium: Secondary | ICD-10-CM | POA: Diagnosis not present

## 2021-05-31 DIAGNOSIS — M79675 Pain in left toe(s): Secondary | ICD-10-CM | POA: Diagnosis not present

## 2021-05-31 DIAGNOSIS — M79674 Pain in right toe(s): Secondary | ICD-10-CM

## 2021-05-31 NOTE — Telephone Encounter (Signed)
Have to go online to download    [email or web address]   Otherwise make appt with me and we can print it out for you and have you take it here  ( 15 minutes )

## 2021-06-03 DIAGNOSIS — H903 Sensorineural hearing loss, bilateral: Secondary | ICD-10-CM | POA: Diagnosis not present

## 2021-06-03 NOTE — Progress Notes (Signed)
Subjective: Zaynah Chawla is a pleasant 70 y.o. female patient seen today painful thick toenails that are difficult to trim. Pain interferes with ambulation. Aggravating factors include wearing enclosed shoe gear. Pain is relieved with periodic professional debridement.   Allergies  Allergen Reactions   Bee Venom Hives   Prednisone Other (See Comments)    hallucinations    Objective: Physical Exam  General: Trista Ciocca is a pleasant 70 y.o. African American female, WD, WN in NAD. AAO x 3.   Vascular:  Capillary refill time to digits immediate b/l. Palpable pedal pulses b/l LE. Pedal hair present. Lower extremity skin temperature gradient within normal limits. No pain with calf compression b/l.  Dermatological:  Pedal skin with normal turgor, texture and tone b/l lower extremities. No open wounds b/l lower extremities. No interdigital macerations b/l lower extremities. Toenails 1-5 b/l elongated, discolored, dystrophic, thickened, crumbly with subungual debris and tenderness to dorsal palpation. Well healed surgical scars noted dorsal aspect of 5th metatarsal and medial aspect of right ankle.  Musculoskeletal:  Normal muscle strength 5/5 to all lower extremity muscle groups bilaterally. No gross bony deformities bilaterally. Limited joint ROM to the right ankle.  Neurological:  Protective sensation intact 5/5 intact bilaterally with 10g monofilament b/l. Vibratory sensation intact b/l.  Assessment and Plan:  1. Pain due to onychomycosis of toenails of both feet   -Examined patient. -Patient to continue soft, supportive shoe gear daily. -Toenails 1-5 b/l were debrided in length and girth with sterile nail nippers and dremel without iatrogenic bleeding.  -Patient to report any pedal injuries to medical professional immediately. -Patient/POA to call should there be question/concern in the interim.  Return in about 3 months (around 08/31/2021).  Marzetta Board, DPM

## 2021-06-07 NOTE — Telephone Encounter (Signed)
Drop it off so I can score it and then  we can make appt  to review and decide  any other evaluation.

## 2021-06-10 NOTE — Telephone Encounter (Signed)
Scoring seems to be 18/22  will review at  upcoming visit.

## 2021-06-14 ENCOUNTER — Telehealth (INDEPENDENT_AMBULATORY_CARE_PROVIDER_SITE_OTHER): Payer: Medicare HMO | Admitting: Internal Medicine

## 2021-06-14 ENCOUNTER — Encounter: Payer: Self-pay | Admitting: Internal Medicine

## 2021-06-14 VITALS — Ht 64.0 in | Wt 179.0 lb

## 2021-06-14 DIAGNOSIS — H34212 Partial retinal artery occlusion, left eye: Secondary | ICD-10-CM | POA: Diagnosis not present

## 2021-06-14 DIAGNOSIS — H35039 Hypertensive retinopathy, unspecified eye: Secondary | ICD-10-CM

## 2021-06-14 DIAGNOSIS — R413 Other amnesia: Secondary | ICD-10-CM

## 2021-06-14 DIAGNOSIS — R739 Hyperglycemia, unspecified: Secondary | ICD-10-CM | POA: Diagnosis not present
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
## 2021-06-30 ENCOUNTER — Ambulatory Visit (INDEPENDENT_AMBULATORY_CARE_PROVIDER_SITE_OTHER): Payer: Medicare HMO

## 2021-06-30 DIAGNOSIS — Z0000 Encounter for general adult medical examination without abnormal findings: Secondary | ICD-10-CM

## 2021-06-30 NOTE — Progress Notes (Signed)
Subjective:   Damiya Guiza is a 70 y.o. female who presents for Medicare Annual (Subsequent) preventive examination.  Patient requested telephone visit.   Virtual Visit via Video Note  I connected with [name] by a video enabled telemedicine application and verified that I am speaking with the correct person using two identifiers.  Location: Patient: Home Provider: Office Persons participating in the virtual visit: patient, provider   I discussed the limitations of evaluation and management by telemedicine and the availability of in person appointments. The patient expressed understanding and agreed to proceed.     Randel Pigg ,LPN  Review of Systems    N/a       Objective:    There were no vitals filed for this visit. There is no height or weight on file to calculate BMI.  Advanced Directives 06/22/2020 06/26/2019 08/22/2018 07/09/2015  Does Patient Have a Medical Advance Directive? Yes Yes Yes Yes  Type of Paramedic of [place];Living will - - Living will;Healthcare Power of Attorney  Does patient want to make changes to medical advance directive? No - Patient declined - - -  Copy of [place] in Chart? No - copy requested - - -    Current Medications (verified) Outpatient Encounter Medications as of 06/30/2021  Medication Sig   Ascorbic Acid (VITAMIN C) 500 MG CAPS Take 1 capsule by mouth daily.   cephALEXin (KEFLEX) 500 MG capsule cephalexin 500 mg capsule  TAKE 1 CAPSULE BY MOUTH THREE TIMES DAILY   cetirizine (ZYRTEC) 10 MG tablet Take 10 mg by mouth daily. Patient alternates with Allegra   COVID-19 mRNA Vac-TriS, Pfizer, (PFIZER-BIONT COVID-19 VAC-TRIS) SUSP injection Inject into the muscle.   desonide (DESOWEN) 0.05 % cream desonide 0.05 % topical cream  APPLY CREAM TOPICALLY TWICE DAILY TO ITCHY AREA ON FACE NOT IN THE EYE   diclofenac sodium (VOLTAREN) 1 % GEL Apply 2 g topically 4 (four) times daily.    docusate sodium (COLACE) 100 MG capsule Take 1 capsule (100 mg total) by mouth every 12 (twelve) hours.   fexofenadine (ALLEGRA) 60 MG tablet Take 60 mg by mouth daily. Patient alternates with Zyrtec   fluticasone (FLONASE) 50 MCG/ACT nasal spray USE 2 SPRAY(S) IN EACH NOSTRIL ONCE DAILY AT  NIGHT   Glycerin-Polysorbate 80 (REFRESH DRY EYE THERAPY OP) Apply to eye.   methylPREDNISolone (MEDROL) 4 MG tablet methylprednisolone 4 mg tablets in a dose pack  TAKE BY MOUTH AS DIRECTED ON INSIDE OF PACKAGE   Multiple Vitamin (MULTIVITAMIN) tablet Take 1 tablet by mouth daily.   NONFORMULARY OR COMPOUNDED ITEM Apply 1-2 g topically daily. [facility]: Nail laquer   NONFORMULARY OR COMPOUNDED ITEM Antifungal solution: Terbinafine 3%, Fluconazole 2%, Tea Tree Oil 5%, Urea 10%, Ibuprofen 2% in DMSO suspension #49m   ondansetron (ZOFRAN) 4 MG tablet ondansetron HCl 4 mg tablet  TAKE 1 TABLET BY MOUTH EVERY 8 HOURS AS NEEDED   triamcinolone ointment (KENALOG) 0.1 % Apply small amt twice per day x 14 days then twice weekly   VITAMIN D PO Take 5,000 Int'l Units by mouth every other day.   No facility-administered encounter medications on file as of 06/30/2021.    Allergies (verified) Bee venom and Prednisone   History: Past Medical History:  Diagnosis Date   ALLERGIC RHINITIS 09/14/2006   Allergy    Fibroid    Hx of adenomatous colonic polyps 07/28/2015   Seasonal allergies    Past Surgical History:  Procedure Laterality  Date   ABDOMINAL HYSTERECTOMY  1996   BREAST EXCISIONAL BIOPSY Left 1983   No scar seen   BREAST EXCISIONAL BIOPSY Right 1987   NO scar seen   BREAST SURGERY Bilateral 1983, 1996   benign   CARPAL TUNNEL RELEASE Right 2008   COLONOSCOPY  07/23/2015   CYSTECTOMY Bilateral 1983, 1988   breast; benign   KNEE ARTHROSCOPY Right 2010   TOE SURGERY Bilateral    TOTAL KNEE ARTHROPLASTY Right 2011   Family History  Problem Relation Age of Onset   Stroke Mother     Diabetes Mother    Diabetes Father    Hypertension Sister    Esophageal cancer Paternal Grandfather 79   Colon cancer Neg Hx    Stomach cancer Neg Hx    Rectal cancer Neg Hx    Breast cancer Neg Hx    Colon polyps Neg Hx    Social History   Socioeconomic History   Marital status: Single    Spouse name: Not on file   Number of children: Not on file   Years of education: Not on file   Highest education level: Not on file  Occupational History   Not on file  Tobacco Use   Smoking status: Every Day    Types: Cigarettes   Smokeless tobacco: Never  Vaping Use   Vaping Use: Never used  Substance and Sexual Activity   Alcohol use: Yes    Alcohol/week: 2.0 standard drinks    Types: 2 Glasses of wine per week    Comment: social   Drug use: No   Sexual activity: Yes    Birth control/protection: Surgical    Comment: HYST  Other Topics Concern   Not on file  Social History Narrative   Going to [facility] of 1   No pets       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    Social Determinants of Health   Financial Resource Strain: Not on file  Food Insecurity: Not on file  Transportation Needs: Not on file  Physical Activity: Not on file  Stress: Not on file  Social Connections: Not on file    Tobacco Counseling Ready to quit: Not Answered Counseling given: Not Answered   Clinical Intake:                 Diabetic?no         Activities of Daily Living No flowsheet data found.  Patient Care Team: Panosh, [name], MD as PCP - General (Internal Medicine) Huel Cote, NP (Inactive) as Nurse Practitioner (Obstetrics and Gynecology) Viona Gilmore, [facility] as Pharmacist (Pharmacist)  Indicate any recent Medical Services you may have received from other than [facility] providers in the past year (date may be approximate).     Assessment:   This is a routine wellness examination for Emmajo.  Hearing/Vision screen No results found.  Dietary issues  and exercise activities discussed:     Goals Addressed   None    Depression Screen PHQ 2/9 Scores 06/22/2020 09/23/2019 08/22/2018 09/09/2016 05/20/2015  PHQ - 2 Score 0 0 0 0 0  PHQ- 9 Score 0 - - - -    Fall Risk Fall Risk  09/23/2020 06/22/2020 06/09/2020 10/08/2019 09/23/2019  Falls in the past year? 0 0 0 0 1  Comment - - Emmi Telephone Survey: data to providers prior to load [employer] Survey: data to providers prior to load -  Number falls in past yr: 0 0 - - 0  Injury with Fall? 0 0 - - 1  Risk for fall due to : - Medication side effect - - -  Follow up - Falls evaluation completed;Falls prevention discussed - - Falls evaluation completed    FALL RISK PREVENTION PERTAINING TO THE HOME:  Any stairs in or around the home? No  If so, are there any without handrails? No  Home free of loose throw rugs in walkways, pet beds, electrical cords, etc? Yes  Adequate lighting in your home to reduce risk of falls? Yes   ASSISTIVE DEVICES UTILIZED TO PREVENT FALLS:  Life alert? No  Use of a cane, walker or w/c? No  Grab bars in the bathroom? No  Shower chair or bench in shower? Yes  Elevated toilet seat or a handicapped toilet? No    Cognitive Function: Normal cognitive status assessed by direct observation by this Nurse Health Advisor. No abnormalities found.   MMSE - Mini Mental State Exam 08/22/2018  Not completed: (No Data)        Immunizations Immunization History  Administered Date(s) Administered   Hep A / Hep B 01/25/2018, 02/27/2018, 08/10/2018   Influenza Split 08/07/2012, 08/23/2014   Influenza Whole 08/14/2001, 09/10/2007, 08/16/2010, 08/15/2011   Influenza, High Dose Seasonal PF 08/23/2017, 08/10/2018, 09/02/2019, 10/01/2020   Influenza,inj,Quad PF,6+ Mos 08/16/2016   Influenza-Unspecified 08/14/2013, 08/14/2017   PFIZER Comirnaty(Gray Top)Covid-19 Tri-Sucrose Vaccine 04/23/2021   PFIZER(Purple Top)SARS-COV-2 Vaccination 12/03/2019, 12/25/2019, 08/21/2020    Pneumococcal Conjugate-13 09/30/2016   Pneumococcal Polysaccharide-23 11/15/2011, 09/18/2018   Pneumococcal-Unspecified 07/15/2016   Td 03/13/2009   Tdap 01/25/2018   Zoster Recombinat (Shingrix) 03/22/2017, 12/11/2017   Zoster, Live 05/28/2012    TDAP status: Up to date  Flu Vaccine status: Up to date  Pneumococcal vaccine status: Up to date  Covid-19 vaccine status: Completed vaccines  Qualifies for Shingles Vaccine? Yes   Zostavax completed No   Shingrix Completed?: Yes  Screening Tests Health Maintenance  Topic Date Due   INFLUENZA VACCINE  06/14/2021   COVID-19 Vaccine (5 - Booster for [place] series) 08/23/2021   MAMMOGRAM  01/16/2023   COLONOSCOPY (Pts 45-23yr Insurance coverage will need to be confirmed)  10/16/2023   TETANUS/TDAP  01/26/2028   DEXA SCAN  Completed   Hepatitis C Screening  Completed   PNA vac Low Risk Adult  Completed   Zoster Vaccines- Shingrix  Completed   HPV VACCINES  Aged Out    Health Maintenance  Health Maintenance Due  Topic Date Due   INFLUENZA VACCINE  06/14/2021    Colorectal cancer screening: Type of screening: Colonoscopy. Completed 10/15/2018. Repeat every 10 years  Mammogram status: Completed 01/15/2021. Repeat every year  Bone Density status: Completed 04/14/2020. Results reflect: Bone density results: OSTEOPENIA. Repeat every 5 years.  Lung Cancer Screening: (Low Dose CT Chest recommended if Age 70-80years, 30 pack-year currently smoking OR have quit w/in 15years.) does not qualify.   Lung Cancer Screening Referral: n/a  Additional Screening:  Hepatitis C Screening: does not qualify; Completed 03/06/2013  Vision Screening: Recommended annual ophthalmology exams for early detection of glaucoma and other disorders of the eye. Is the patient up to date with their annual eye exam?  Yes  Who is the provider or what is the name of the office in which the patient attends annual eye exams? Dr.Weaver If pt is not established  with a provider, would they like to be referred to a provider to establish care?  No .   Dental Screening: Recommended annual dental exams for proper oral hygiene  Community Resource Referral / Chronic Care Management: CRR required this visit?  No   CCM required this visit?  No      Plan:     I have personally reviewed and noted the following in the patient's chart:   Medical and social history Use of alcohol, tobacco or illicit drugs  Current medications and supplements including opioid prescriptions.  Functional ability and status Nutritional status Physical activity Advanced directives List of other physicians Hospitalizations, surgeries, and ER visits in previous 12 months Vitals Screenings to include cognitive, depression, and falls Referrals and appointments  In addition, I have reviewed and discussed with patient certain preventive protocols, quality metrics, and best practice recommendations. A written personalized care plan for preventive services as well as general preventive health recommendations were provided to patient.     Randel Pigg, LPN   [id number]   Nurse Notes: none

## 2021-06-30 NOTE — Patient Instructions (Signed)
Debra Woodward , Thank you for taking time to come for your Medicare Wellness Visit. I appreciate your ongoing commitment to your health goals. Please review the following plan we discussed and let me know if I can assist you in the future.   Screening recommendations/referrals: Colonoscopy: 10/15/2018 due 10 years  Mammogram: 01/15/2021 Bone Density: 04/14/2020 Recommended yearly ophthalmology/optometry visit for glaucoma screening and checkup Recommended yearly dental visit for hygiene and checkup  Vaccinations: Influenza vaccine: due in fall 2022  Pneumococcal vaccine: completed series  Tdap vaccine: 01/25/2018 Shingles vaccine: completed series   Advanced directives: will provide copies   Conditions/risks identified: none   Next appointment: none    Preventive Care 5 Years and Older, Female Preventive care refers to lifestyle choices and visits with your health care provider that can promote health and wellness. What does preventive care include? A yearly physical exam. This is also called an annual well check. Dental exams once or twice a year. Routine eye exams. Ask your health care provider how often you should have your eyes checked. Personal lifestyle choices, including: Daily care of your teeth and gums. Regular physical activity. Eating a healthy diet. Avoiding tobacco and drug use. Limiting alcohol use. Practicing safe sex. Taking low-dose aspirin every day. Taking vitamin and mineral supplements as recommended by your health care provider. What happens during an annual well check? The services and screenings done by your health care provider during your annual well check will depend on your age, overall health, lifestyle risk factors, and family history of disease. Counseling  Your health care provider may ask you questions about your: Alcohol use. Tobacco use. Drug use. Emotional well-being. Home and relationship well-being. Sexual activity. Eating  habits. History of falls. Memory and ability to understand (cognition). Work and work Statistician. Reproductive health. Screening  You may have the following tests or measurements: Height, weight, and BMI. Blood pressure. Lipid and cholesterol levels. These may be checked every 5 years, or more frequently if you are over 65 years old. Skin check. Lung cancer screening. You may have this screening every year starting at age 77 if you have a 30-pack-year history of smoking and currently smoke or have quit within the past 15 years. Fecal occult blood test (FOBT) of the stool. You may have this test every year starting at age 55. Flexible sigmoidoscopy or colonoscopy. You may have a sigmoidoscopy every 5 years or a colonoscopy every 10 years starting at age 63. Hepatitis C blood test. Hepatitis B blood test. Sexually transmitted disease (STD) testing. Diabetes screening. This is done by checking your blood sugar (glucose) after you have not eaten for a while (fasting). You may have this done every 1-3 years. Bone density scan. This is done to screen for osteoporosis. You may have this done starting at age 57. Mammogram. This may be done every 1-2 years. Talk to your health care provider about how often you should have regular mammograms. Talk with your health care provider about your test results, treatment options, and if necessary, the need for more tests. Vaccines  Your health care provider may recommend certain vaccines, such as: Influenza vaccine. This is recommended every year. Tetanus, diphtheria, and acellular pertussis (Tdap, Td) vaccine. You may need a Td booster every 10 years. Zoster vaccine. You may need this after age 17. Pneumococcal 13-valent conjugate (PCV13) vaccine. One dose is recommended after age 9. Pneumococcal polysaccharide (PPSV23) vaccine. One dose is recommended after age 56. Talk to your health care provider about which  screenings and vaccines you need and how  often you need them. This information is not intended to replace advice given to you by your health care provider. Make sure you discuss any questions you have with your health care provider. Document Released: 11/27/2015 Document Revised: 07/20/2016 Document Reviewed: 09/01/2015 Elsevier Interactive Patient Education  2017 [employer] Prevention in the Home Falls can cause injuries. They can happen to people of all ages. There are many things you can do to make your home safe and to help prevent falls. What can I do on the outside of my home? Regularly fix the edges of walkways and driveways and fix any cracks. Remove anything that might make you trip as you walk through a door, such as a raised step or threshold. Trim any bushes or trees on the path to your home. Use bright outdoor lighting. Clear any walking paths of anything that might make someone trip, such as rocks or tools. Regularly check to see if handrails are loose or broken. Make sure that both sides of any steps have handrails. Any raised decks and porches should have guardrails on the edges. Have any leaves, snow, or ice cleared regularly. Use sand or salt on walking paths during winter. Clean up any spills in your garage right away. This includes oil or grease spills. What can I do in the bathroom? Use night lights. Install grab bars by the toilet and in the tub and shower. Do not use towel bars as grab bars. Use non-skid mats or decals in the tub or shower. If you need to sit down in the shower, use a plastic, non-slip stool. Keep the floor dry. Clean up any water that spills on the floor as soon as it happens. Remove soap buildup in the tub or shower regularly. Attach bath mats securely with double-sided non-slip rug tape. Do not have throw rugs and other things on the floor that can make you trip. What can I do in the bedroom? Use night lights. Make sure that you have a light by your bed that is easy to  reach. Do not use any sheets or blankets that are too big for your bed. They should not hang down onto the floor. Have a firm chair that has side arms. You can use this for support while you get dressed. Do not have throw rugs and other things on the floor that can make you trip. What can I do in the kitchen? Clean up any spills right away. Avoid walking on wet floors. Keep items that you use a lot in easy-to-reach places. If you need to reach something above you, use a strong step stool that has a grab bar. Keep electrical cords out of the way. Do not use floor polish or wax that makes floors slippery. If you must use wax, use non-skid floor wax. Do not have throw rugs and other things on the floor that can make you trip. What can I do with my stairs? Do not leave any items on the stairs. Make sure that there are handrails on both sides of the stairs and use them. Fix handrails that are broken or loose. Make sure that handrails are as long as the stairways. Check any carpeting to make sure that it is firmly attached to the stairs. Fix any carpet that is loose or worn. Avoid having throw rugs at the top or bottom of the stairs. If you do have throw rugs, attach them to the floor with carpet  tape. Make sure that you have a light switch at the top of the stairs and the bottom of the stairs. If you do not have them, ask someone to add them for you. What else can I do to help prevent falls? Wear shoes that: Do not have high heels. Have rubber bottoms. Are comfortable and fit you well. Are closed at the toe. Do not wear sandals. If you use a stepladder: Make sure that it is fully opened. Do not climb a closed stepladder. Make sure that both sides of the stepladder are locked into place. Ask someone to hold it for you, if possible. Clearly mark and make sure that you can see: Any grab bars or handrails. First and last steps. Where the edge of each step is. Use tools that help you move  around (mobility aids) if they are needed. These include: Canes. Walkers. Scooters. Crutches. Turn on the lights when you go into a dark area. Replace any light bulbs as soon as they burn out. Set up your furniture so you have a clear path. Avoid moving your furniture around. If any of your floors are uneven, fix them. If there are any pets around you, be aware of where they are. Review your medicines with your doctor. Some medicines can make you feel dizzy. This can increase your chance of falling. Ask your doctor what other things that you can do to help prevent falls. This information is not intended to replace advice given to you by your health care provider. Make sure you discuss any questions you have with your health care provider. Document Released: 08/27/2009 Document Revised: 04/07/2016 Document Reviewed: 12/05/2014 Elsevier Interactive Patient Education  2017 [employer].

## 2021-07-28 ENCOUNTER — Other Ambulatory Visit: Payer: Self-pay

## 2021-07-28 ENCOUNTER — Ambulatory Visit (INDEPENDENT_AMBULATORY_CARE_PROVIDER_SITE_OTHER): Payer: Medicare HMO | Admitting: Otolaryngology

## 2021-07-28 DIAGNOSIS — H903 Sensorineural hearing loss, bilateral: Secondary | ICD-10-CM | POA: Diagnosis not present

## 2021-07-28 DIAGNOSIS — H6123 Impacted cerumen, bilateral: Secondary | ICD-10-CM | POA: Diagnosis not present

## 2021-07-28 NOTE — Progress Notes (Signed)
HPI: Yuktha Cullin is a 70 y.o. female who presents for evaluation of wax buildup in her ears.  She was last cleaned about 4 months ago.  She has underlying sensorineural hearing loss in both ears.  She is doing well otherwise.  She has noted some slight swelling beneath the left earlobe..  Past Medical History:  Diagnosis Date   ALLERGIC RHINITIS 09/14/2006   Allergy    Fibroid    Hx of adenomatous colonic polyps 07/28/2015   Seasonal allergies    Past Surgical History:  Procedure Laterality Date   ABDOMINAL HYSTERECTOMY  1996   BREAST EXCISIONAL BIOPSY Left 1983   No scar seen   BREAST EXCISIONAL BIOPSY Right 1987   NO scar seen   BREAST SURGERY Bilateral 1983, 1996   benign   CARPAL TUNNEL RELEASE Right 2008   COLONOSCOPY  07/23/2015   CYSTECTOMY Bilateral 1983, 1988   breast; benign   KNEE ARTHROSCOPY Right 2010   TOE SURGERY Bilateral    TOTAL KNEE ARTHROPLASTY Right 2011   Social History   Socioeconomic History   Marital status: Single    Spouse name: Not on file   Number of children: Not on file   Years of education: Not on file   Highest education level: Not on file  Occupational History   Not on file  Tobacco Use   Smoking status: Every Day    Types: Cigarettes   Smokeless tobacco: Never  Vaping Use   Vaping Use: Never used  Substance and Sexual Activity   Alcohol use: Yes    Alcohol/week: 2.0 standard drinks    Types: 2 Glasses of wine per week    Comment: social   Drug use: No   Sexual activity: Yes    Birth control/protection: Surgical    Comment: HYST  Other Topics Concern   Not on file  Social History Narrative   Going [facility]   No pets       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    Social Determinants of Health   Financial Resource Strain: Low Risk    Difficulty of Paying Living Expenses: Not hard at all  Food Insecurity: No Food Insecurity   Worried About Charity fundraiser in the Last Year: Never true   [place] in the Last Year: Never true  Transportation Needs: No Transportation Needs   Lack of Transportation (Medical): No   Lack of Transportation (Non-Medical): No  Physical Activity: Sufficiently Active   Days of Exercise per Week: 4 days   Minutes of Exercise per Session: 50 min  Stress: No Stress Concern Present   Feeling of Stress : Not at all  Social Connections: Moderately Integrated   Frequency of Communication with Friends and Family: Three times a week   Frequency of Social Gatherings with Friends and Family: Three times a week   Attends Religious Services: More than 4 times per year   Active Member of Clubs or Organizations: Yes   Attends Music therapist: More than 4 times per year   Marital Status: Divorced   Family History  Problem Relation Age of Onset   Stroke Mother    Diabetes Mother    Diabetes Father    Hypertension Sister    Esophageal cancer Paternal Grandfather 39   Colon cancer Neg Hx    Stomach cancer Neg Hx    Rectal cancer Neg Hx  Breast cancer Neg Hx    Colon polyps Neg Hx    Allergies  Allergen Reactions   Bee Venom Hives   Prednisone Other (See Comments)    hallucinations   Prior to Admission medications   Medication Sig Start Date End Date Taking? Authorizing Provider  Ascorbic Acid (VITAMIN C) 500 MG CAPS Take 1 capsule by mouth daily.    [provider]  cephALEXin (KEFLEX) 500 MG capsule cephalexin 500 mg capsule  TAKE 1 CAPSULE BY MOUTH THREE TIMES DAILY    [provider]  cetirizine (ZYRTEC) 10 MG tablet Take 10 mg by mouth daily. Patient alternates with Allegra    [provider]  COVID-19 mRNA Vac-TriS, Pfizer, (PFIZER-BIONT COVID-19 VAC-TRIS) SUSP injection Inject into the muscle. 04/23/21   Carlyle Basques, MD  desonide (DESOWEN) 0.05 % cream desonide 0.05 % topical cream  APPLY CREAM TOPICALLY TWICE DAILY TO ITCHY AREA ON FACE NOT IN THE EYE    [provider]  diclofenac sodium  (VOLTAREN) 1 % GEL Apply 2 g topically 4 (four) times daily. 01/16/19   [provider]  docusate sodium (COLACE) 100 MG capsule Take 1 capsule (100 mg total) by mouth every 12 (twelve) hours. 06/26/19   Khatri, Hina, PA-C  fexofenadine (ALLEGRA) 60 MG tablet Take 60 mg by mouth daily. Patient alternates with Zyrtec    [provider]  fluticasone (FLONASE) 50 MCG/ACT nasal spray USE 2 SPRAY(S) IN [facility] NOSTRIL ONCE DAILY AT  NIGHT 01/15/21   Rozetta Nunnery, MD  Glycerin-Polysorbate 80 (REFRESH DRY EYE THERAPY OP) Apply to eye.    [provider]  methylPREDNISolone (MEDROL) 4 MG tablet methylprednisolone 4 mg tablets in a dose pack  TAKE BY MOUTH AS DIRECTED ON INSIDE OF PACKAGE Patient not taking: Reported on 06/30/2021    [provider]  Multiple Vitamin (MULTIVITAMIN) tablet Take 1 tablet by mouth daily.    [provider]  NONFORMULARY OR COMPOUNDED ITEM Apply 1-2 g topically daily. [facility]: Nail laquer 02/12/19   Marzetta Board, DPM  NONFORMULARY OR COMPOUNDED ITEM Antifungal solution: Terbinafine 3%, Fluconazole 2%, Tea Tree Oil 5%, Urea 10%, Ibuprofen 2% in DMSO suspension #49m 08/24/20   [place], JStephani Police DPM  ondansetron (ZOFRAN) 4 MG tablet ondansetron HCl 4 mg tablet  TAKE 1 TABLET BY MOUTH EVERY 8 HOURS AS NEEDED Patient not taking: Reported on 06/30/2021    [provider]  triamcinolone ointment (KENALOG) 0.1 % Apply small amt twice per day x 14 days then twice weekly 11/23/20   DKarma Ganja NP  VITAMIN D PO Take 5,000 Int'l Units by mouth every other day.    [provider]     Positive ROS: Otherwise negative  All other systems have been reviewed and were otherwise negative with the exception of those mentioned in the HPI and as above.  Physical Exam: Constitutional: Alert, well-appearing, no acute distress Ears: External ears without lesions or tenderness. Ear canals moderate wax buildup in  both ear canals with a very narrowed ear canal on the left side.  After cleaning the ear canals with curettes TMs were otherwise clear.. Nasal: External nose without lesions. Clear nasal passages Oral: Oropharynx clear.  Drainage from the parotid ducts was clear bilaterally. Neck: No palpable adenopathy or masses.  No significant palpable parotid mass noted on the left side but does have slight fullness just anterior and inferior to the earlobe on the left side.  But no definite palpable mass.  No adenopathy lower in the neck. Respiratory: Breathing comfortably  Skin: No facial/neck lesions or rash noted.  Cerumen impaction removal  Date/Time: 07/28/2021 9:01 AM Performed by: Rozetta Nunnery, MD Authorized by: Rozetta Nunnery, MD   Consent:    Consent obtained:  Verbal   Consent given by:  Patient   Risks discussed:  Pain and bleeding Procedure details:    Location:  L ear and R ear   Procedure type: curette   Post-procedure details:    Inspection:  TM intact and canal normal   Hearing quality:  Improved   Procedure completion:  Tolerated well, no immediate complications Comments:     Ear canals were cleaned with a curette.  TMs were clear bilaterally.  Assessment: Wax buildup in both ear canals and patient with small ear canals.  Patient with underlying sensorineural hearing loss.  Plan: She will follow-up as needed.  Radene Journey, MD

## 2021-08-03 ENCOUNTER — Telehealth: Payer: Self-pay | Admitting: Pharmacist

## 2021-08-03 NOTE — Chronic Care Management (AMB) (Signed)
    Chronic Care Management Pharmacy Assistant   Name: Debra Woodward  MRN: [id number] DOB: [birth year]   08-03-2021 APPOINTMENT REMINDER   Called [name], No answer, left message of appointment on 08-04-2021 at 4:30 via telephone visit with Jeni Salles, Pharm D. Notified to have all medications, supplements, blood pressure and/or blood sugar logs available during appointment and to return call if need to reschedule.   Care Gaps: Flu Vaccine - Overdue AWV - Done 06-30-21  Star Rating Drug: None  Any gaps in medications fill history? None  Medications: Outpatient Encounter Medications as of 08/03/2021  Medication Sig   Ascorbic Acid (VITAMIN C) 500 MG CAPS Take 1 capsule by mouth daily.   cephALEXin (KEFLEX) 500 MG capsule cephalexin 500 mg capsule  TAKE 1 CAPSULE BY MOUTH THREE TIMES DAILY   cetirizine (ZYRTEC) 10 MG tablet Take 10 mg by mouth daily. Patient alternates with Allegra   COVID-19 mRNA Vac-TriS, Pfizer, (PFIZER-BIONT COVID-19 VAC-TRIS) SUSP injection Inject into the muscle.   desonide (DESOWEN) 0.05 % cream desonide 0.05 % topical cream  APPLY CREAM TOPICALLY TWICE DAILY TO ITCHY AREA ON FACE NOT IN THE EYE   diclofenac sodium (VOLTAREN) 1 % GEL Apply 2 g topically 4 (four) times daily.   docusate sodium (COLACE) 100 MG capsule Take 1 capsule (100 mg total) by mouth every 12 (twelve) hours.   fexofenadine (ALLEGRA) 60 MG tablet Take 60 mg by mouth daily. Patient alternates with Zyrtec   fluticasone (FLONASE) 50 MCG/ACT nasal spray USE 2 SPRAY(S) IN EACH NOSTRIL ONCE DAILY AT  NIGHT   Glycerin-Polysorbate 80 (REFRESH DRY EYE THERAPY OP) Apply to eye.   methylPREDNISolone (MEDROL) 4 MG tablet methylprednisolone 4 mg tablets in a dose pack  TAKE BY MOUTH AS DIRECTED ON INSIDE OF PACKAGE (Patient not taking: Reported on 06/30/2021)   Multiple Vitamin (MULTIVITAMIN) tablet Take 1 tablet by mouth daily.   NONFORMULARY OR COMPOUNDED ITEM Apply 1-2 g topically daily.  [facility]: Nail laquer   NONFORMULARY OR COMPOUNDED ITEM Antifungal solution: Terbinafine 3%, Fluconazole 2%, Tea Tree Oil 5%, Urea 10%, Ibuprofen 2% in DMSO suspension #83mL   ondansetron (ZOFRAN) 4 MG tablet ondansetron HCl 4 mg tablet  TAKE 1 TABLET BY MOUTH EVERY 8 HOURS AS NEEDED (Patient not taking: Reported on 06/30/2021)   triamcinolone ointment (KENALOG) 0.1 % Apply small amt twice per day x 14 days then twice weekly   VITAMIN D PO Take 5,000 Int'l Units by mouth every other day.   No facility-administered encounter medications on file as of 08/03/2021.    [place] Clinical Pharmacist Assistant [phone]          Time Spent 6 min

## 2021-08-04 ENCOUNTER — Ambulatory Visit (INDEPENDENT_AMBULATORY_CARE_PROVIDER_SITE_OTHER): Payer: Medicare HMO | Admitting: Pharmacist

## 2021-08-04 DIAGNOSIS — H35039 Hypertensive retinopathy, unspecified eye: Secondary | ICD-10-CM

## 2021-08-04 DIAGNOSIS — F17200 Nicotine dependence, unspecified, uncomplicated: Secondary | ICD-10-CM

## 2021-08-04 NOTE — Progress Notes (Signed)
Chronic Care Management Pharmacy Note  08/13/2021 Name:  Debra Woodward MRN:  [id number] DOB:  November 07, 1951  Summary: Patient continues to smoke BP has been at goal < 130/80 per home readings  Recommendations/Changes made from today's visit: -Recommended for patient to call quit line and provided phone number for supplies (lozenges) -Recommended getting a bag and separating out 4 cigarettes per day and slowly tapering off by 1 cigarette per month  Plan: -Follow up in 3 months  Subjective: Debra Woodward is an 70 y.o. year old female who is a primary patient of Panosh, [name], MD.  The CCM team was consulted for assistance with disease management and care coordination needs.    Engaged with patient by telephone for follow up visit in response to provider referral for pharmacy case management and/or care coordination services.   Consent to Services:  The patient was given information about Chronic Care Management services, agreed to services, and gave verbal consent prior to initiation of services.  Please see initial visit note for detailed documentation.   Patient Care Team: Panosh, [name], MD as PCP - General (Internal Medicine) Huel Cote, NP (Inactive) as Nurse Practitioner (Obstetrics and Gynecology) Viona Gilmore, [facility] as Pharmacist (Pharmacist)  Recent office visits: 06/30/21 Randel Pigg, LPN: [name]  06/14/21 Shanon Ace, MD: Patient presented for video visit for concerns for memory. Recommended checking blood pressure at home.   Recent consult visits: 07/28/21 Melony Overly, MD (ENT): Patient presented for bilateral impacted cerumen removal.   05/31/21 Acquanetta Sit, DPM (podiatry): Patient presented for nail trim.  05/10/21 Melony Overly, MD (ENT): Patient presented for bilateral impacted cerumen removal.   02/24/21 Podiatry Gardiner Barefoot, DPM: presented for onychomycosis for both feet.  No medication changes.  02/10/21 Otolaryngolo Rozetta Nunnery, MD: presented for hearing loss in both ears/Bilateral impacted cerumen. Per note: The doctor suggested an hearing test be done and to follow up in the future to get her ears cleaned out No medication changes.  11/23/20 Karma Ganja, NP (GYN): Patient presented for vaginitis. Recommended vaseline first and then triamcinolone.   08/25/20 Marny Lowenstein, NP (OBGYN): Patient presented for vaginal discharge. Wet prep was unremarkable with normal pelvic exam.   08/21/20 Acquanetta Sit, DPM (podiatry): Patient presented for toe nail removal.  Hospital visits: None in previous 6 months  Objective:  Lab Results  Component Value Date   CREATININE 0.74 09/23/2020   BUN 9 09/23/2020   GFR 105.91 09/23/2019   GFRNONAA 83 09/23/2020   GFRAA 96 09/23/2020   NA 140 09/23/2020   K 4.6 09/23/2020   CALCIUM 10.4 09/23/2020   CO2 27 09/23/2020   GLUCOSE 111 (H) 09/23/2020    Lab Results  Component Value Date/Time   HGBA1C 6.2 (H) 09/23/2020 12:06 PM   HGBA1C 6.4 09/23/2019 04:28 PM   GFR 105.91 09/23/2019 04:28 PM   GFR 99.13 09/18/2018 11:45 AM    Last diabetic Eye exam: No results found for: HMDIABEYEEXA  Last diabetic Foot exam: No results found for: HMDIABFOOTEX   Lab Results  Component Value Date   CHOL 153 09/23/2020   HDL 67 09/23/2020   LDLCALC 70 09/23/2020   TRIG 81 09/23/2020   CHOLHDL 2.3 09/23/2020    Hepatic Function Latest Ref Rng & Units 09/23/2020 06/26/2019 03/08/2017  Total Protein 6.1 - 8.1 g/dL 7.0 7.7 6.5  Albumin 3.5 - 5.0 g/dL - 4.0 4.1  AST 10 - 35 U/L 16  27 14  ALT 6 - 29 U/L 13 44 13  Alk Phosphatase 38 - 126 U/L - 73 78  Total Bilirubin 0.2 - 1.2 mg/dL 0.6 0.4 0.5  Bilirubin, Direct 0.0 - 0.2 mg/dL 0.1 - 0.1    Lab Results  Component Value Date/Time   TSH 2.41 09/23/2019 04:28 PM   TSH 1.74 03/08/2017 08:04 AM    CBC Latest Ref Rng & Units 09/23/2020 09/23/2019 06/26/2019  WBC 3.8 -  10.8 Thousand/uL 5.6 6.5 8.1  Hemoglobin 11.7 - 15.5 g/dL 14.3 12.9 13.1  Hematocrit 35.0 - 45.0 % 41.1 39.0 40.2  Platelets 140 - 400 Thousand/uL 408(H) 371.0 439(H)    Lab Results  Component Value Date/Time   VD25OH 22.44 (L) 03/11/2016 08:58 AM   VD25OH 23 (L) 03/10/2015 04:27 PM    Clinical ASCVD: No  The ASCVD Risk score (Arnett DK, et al., 2019) failed to calculate for the following reasons:   The systolic blood pressure is missing    Depression screen [facility] 2/9 06/30/2021 06/30/2021 06/22/2020  Decreased Interest 0 0 0  Down, Depressed, Hopeless 0 0 0  PHQ - 2 Score 0 0 0  Altered sleeping - - 0  Tired, decreased energy - - 0  Change in appetite - - 0  Feeling bad or failure about yourself  - - 0  Trouble concentrating - - 0  Moving slowly or fidgety/restless - - 0  Suicidal thoughts - - 0  PHQ-9 Score - - 0  Difficult doing work/chores - - Not difficult at all  Some recent data might be hidden      Social History   Tobacco Use  Smoking Status Every Day   Types: Cigarettes  Smokeless Tobacco Never   BP Readings from Last 3 Encounters:  03/22/21 118/70  11/23/20 118/78  09/23/20 (!) 150/88   Pulse Readings from Last 3 Encounters:  03/22/21 76  11/23/20 70  09/23/20 80   Wt Readings from Last 3 Encounters:  06/14/21 179 lb (81.2 kg)  03/22/21 179 lb (81.2 kg)  11/23/20 182 lb (82.6 kg)   BMI Readings from Last 3 Encounters:  06/14/21 30.73 kg/m  03/22/21 30.73 kg/m  11/23/20 30.29 kg/m    Assessment/Interventions: Review of patient past medical history, allergies, medications, health status, including review of consultants reports, laboratory and other test data, was performed as part of comprehensive evaluation and provision of chronic care management services.   SDOH:  (Social Determinants of Health) assessments and interventions performed: No   CCM Care Plan  Allergies  Allergen Reactions   Bee Venom Hives   Prednisone Other (See Comments)     hallucinations    Medications Reviewed Today     Reviewed by Randel Pigg, LPN (Licensed Practical Nurse) on 06/30/21 at 51  Med List Status: <None>   Medication Order Taking? Sig Documenting Provider Last Dose Status Informant  Ascorbic Acid (VITAMIN C) 500 MG CAPS [id number] Yes Take 1 capsule by mouth daily. [provider] Taking Active   cephALEXin (KEFLEX) 500 MG capsule [id number] Yes cephalexin 500 mg capsule  TAKE 1 CAPSULE BY MOUTH THREE TIMES DAILY [provider] Taking Active   cetirizine (ZYRTEC) 10 MG tablet [id number] Yes Take 10 mg by mouth daily. Patient alternates with Allegra [provider] Taking Active   COVID-19 mRNA Vac-TriS, Pfizer, (PFIZER-BIONT COVID-19 VAC-TRIS) SUSP injection [id number]  Inject into the muscle. Carlyle Basques, MD  Active   desonide (DESOWEN) 0.05 % cream [id number] Yes desonide 0.05 %  topical cream  APPLY CREAM TOPICALLY TWICE DAILY TO ITCHY AREA ON FACE NOT IN THE EYE [provider] Taking Active   diclofenac sodium (VOLTAREN) 1 % GEL [id number] Yes Apply 2 g topically 4 (four) times daily. [provider] Taking Active   docusate sodium (COLACE) 100 MG capsule [id number] Yes Take 1 capsule (100 mg total) by mouth every 12 (twelve) hours. Delia Heady, PA-C Taking Active   fexofenadine (ALLEGRA) 60 MG tablet [id number] Yes Take 60 mg by mouth daily. Patient alternates with Zyrtec [provider] Taking Active Self  fluticasone (FLONASE) 50 MCG/ACT nasal spray [id number] Yes USE 2 SPRAY(S) IN EACH NOSTRIL ONCE DAILY AT  Elie Confer, MD Taking Active   Glycerin-Polysorbate 80 [facility] DRY EYE THERAPY OP) [id number] Yes Apply to eye. [provider] Taking Active   methylPREDNISolone (MEDROL) 4 MG tablet [id number] No methylprednisolone 4 mg tablets in a dose pack  TAKE BY MOUTH AS DIRECTED ON INSIDE OF PACKAGE  Patient not taking: Reported on 06/30/2021   [provider] Not Taking Active   Multiple Vitamin (MULTIVITAMIN) tablet [id number] Yes Take 1 tablet by mouth daily. [provider] Taking Active Self  NONFORMULARY OR COMPOUNDED ITEM [id number] Yes Apply 1-2 g topically daily. [facility]: Nail laquer Marzetta Board, DPM Taking Active Self  NONFORMULARY OR COMPOUNDED ITEM [id number] Yes Antifungal solution: Terbinafine 3%, Fluconazole 2%, Tea Tree Oil 5%, Urea 10%, Ibuprofen 2% in DMSO suspension #69m GMarzetta Board DPM Taking Active   ondansetron (ZOFRAN) 4 MG tablet [id number] ondansetron HCl 4 mg tablet  TAKE 1 TABLET BY MOUTH EVERY 8 HOURS AS NEEDED  Patient not taking: Reported on 06/30/2021   [provider] Not Taking Active   triamcinolone ointment (KENALOG) 0.1 % [id number] Apply small amt twice per day x 14 days then twice weekly DKarma Ganja NP Taking Active   VITAMIN D PO [id number] Take 5,000 Int'l Units by mouth every other day. [provider] Taking Active             Patient Active Problem List   Diagnosis Date Noted   Arthralgia of left ankle 04/20/2021   Arthritis of both knees 11/15/2017   Aftercare 11/15/2017   Hx of adenomatous colonic polyps 07/28/2015   Degenerative disc disease, lumbar sacral 11/13/2012   Elevated blood pressure reading 11/13/2012   Allergic rhinitis 09/14/2006    Immunization History  Administered Date(s) Administered   Hep A / Hep B 01/25/2018, 02/27/2018, 08/10/2018   Influenza Split 08/07/2012, 08/23/2014   Influenza Whole 08/14/2001, 09/10/2007, 08/16/2010, 08/15/2011   Influenza, High Dose Seasonal PF 08/23/2017, 08/10/2018, 09/02/2019, 10/01/2020   Influenza,inj,Quad PF,6+ Mos 08/16/2016   Influenza-Unspecified 08/14/2013, 08/14/2017   PFIZER Comirnaty(Gray Top)Covid-19 Tri-Sucrose Vaccine 04/23/2021   PFIZER(Purple Top)SARS-COV-2 Vaccination 12/03/2019, 12/25/2019, 08/21/2020   Pneumococcal Conjugate-13 09/30/2016    Pneumococcal Polysaccharide-23 11/15/2011, 09/18/2018   Pneumococcal-Unspecified 07/15/2016   Td 03/13/2009   Tdap 01/25/2018   Zoster Recombinat (Shingrix) 03/22/2017, 12/11/2017   Zoster, Live 05/28/2012   Patient reports she has been putting herself on the back burners in terms of her health. Her sister GMolli Poseyis not doing well and is looking for prayers for her. She low her brother in law at the end of May as well. She has been trying to stay level headed and patient. She has not picked up any additional cigarettes with added stress. Some days she is not smoking at all with being in doctor's appointments.  Conditions to be addressed/monitored:  Osteoarthritis, Tobacco use, Allergic Rhinitis and elevated blood pressure and pre-diabetes  Conditions addressed this visit: Tobacco use, elevated BP, allergic rhinitis  Care Plan : [place]  Updates made by Viona Gilmore, RPH since 08/13/2021 12:00 AM     Problem: Problem: Elevated blood pressure, tobacco use, allergic rhinitis, arthritis, prediabetes      Long-Range Goal: Patient-Specific Goal   Start Date: 02/02/2021  Expected End Date: 02/02/2022  Recent Progress: On track  Priority: High  Note:   Current Barriers:  Unable to independently monitor therapeutic efficacy  Pharmacist Clinical Goal(s):  Patient will achieve adherence to monitoring guidelines and medication adherence to achieve therapeutic efficacy through collaboration with PharmD and provider.    Interventions: 1:1 collaboration with Panosh, [name], MD regarding development and update of comprehensive plan of care as evidenced by provider attestation and co-signature Inter-disciplinary care team collaboration (see longitudinal plan of care) Comprehensive medication review performed; medication list updated in electronic medical record  Elevated blood pressure (BP goal <130/80) -Controlled -Current treatment: No medications -Medications  previously tried: none  -Current home readings: 118/78 (sister's arm cuff) -Current dietary habits: does not cook with salt and doesn't eat red meat; eats mostly seafood, chicken or [place]; eats lots of vegetables and some fruit -Current exercise habits: limited with taking care of her sister -Denies hypotensive/hypertensive symptoms -Educated on Daily salt intake goal < 2300 mg; Exercise goal of 150 minutes per week; Importance of home blood pressure monitoring; -Counseled to monitor BP at home as directed, document, and provide log at future appointments -Counseled on diet and exercise extensively  Pre-diabetes (A1c goal <6.5%) -Controlled -Current medications: No medications -Medications previously tried: none  -Current home glucose readings fasting glucose: does not check post prandial glucose: does not check -Denies hypoglycemic/hyperglycemic symptoms -Current meal patterns:  breakfast: n/a  lunch: n/a  dinner: n/a snacks: n/a drinks: n/a -Current exercise: n/a -Educated on A1c and blood sugar goals; Exercise goal of 150 minutes per week; Carbohydrate counting and/or plate method -Counseled to check feet daily and get yearly eye exams -Counseled on diet and exercise extensively  Tobacco use (Goal quit smoking) -Uncontrolled -Previous quit attempts: patches (too much nicotine), gum (flavor), quit cold [place] for about 6 months (6-7 years ago)  -Current treatment  No medications -Patient smokes After 30 minutes of waking -Patient triggers include:  habit and with breakfast -On a scale of 1-10, reports MOTIVATION to quit is 8 -On a scale of 1-10, reports CONFIDENCE in quitting is 8 -Counseled to allow lozenge to dissolve and absorb in cheek pocket, rather than swallow, to reduce GI side effects. Provided contact information for [facility] Quit Line (1-800-QUIT-NOW) and encouraged patient to reach out to this group for support. -Counseled on using distractions to stop herself from  picking up a cigarette and putting chewing gum on top of her cigarettes in her bag to give an added layer of distraction Recommended setting a goal of 1 less cigarette per day per month to see if she can slowly taper off and setting them aside in a plastic bag for each day. Educated on using the 2 mg dose of nicotine lozenges if she decides to start  Allergic rhinitis (Goal: minimize symptoms) -Controlled -Current treatment  fexofenadine (Allegra) 52m, 1 tablet once daily  cetirizine (Zyrtec) 184m 1 tablet once daily (alternates antihistamines- does not take together)   fluticasone (Flonase) 5065m act nasal spray, 2 sprays into both nostrils every night  -  Medications previously tried: none  -Recommended to continue current medication Patient has been going to ENT every 3-4 months.  Arthritis (Goal: minimize pain) -Controlled -Current treatment  diclofenac 1% gel, apply 2 grams four times daily (uses as needed) -Medications previously tried: none  -Recommended to continue current medication   Health Maintenance -Vaccine gaps: none -Current therapy:  Docusate 100 mg 1 capsule every 12 hours Refresh dry eye as needed Multivitamin 1 tablet daily Vitamin C 500 mg 1 capsule daily Antifungal solution apply as needed to toenails Triamcinolone 0.1% ointment apply twice weekly Vitamin D 5000 every other day -Educated on Cost vs benefit of each product must be carefully weighed by individual consumer -Patient is satisfied with current therapy and denies issues -Recommended to continue current medication  Patient Goals/Self-Care Activities Patient will:  - take medications as prescribed check blood pressure as directed, document, and provide at future appointments target a minimum of 150 minutes of moderate intensity exercise weekly  Follow Up Plan: Telephone follow up appointment with care management team member scheduled for: 3 months        Medication Assistance: None  required.  Patient affirms current coverage meets needs.  Compliance/Adherence/Medication fill history: Care Gaps: None  Star-Rating Drugs: None  Patient's preferred pharmacy is:  [place], [address] [address] Phone: [phone] Fax: [phone]  [place], [place] [address] Phone: [phone] Fax: [phone]  Uses pill box? No - puts all of her medicines on her dresser Pt endorses 95% compliance  We discussed: Current pharmacy is preferred with insurance plan and patient is satisfied with pharmacy services Patient decided to: Continue current medication management strategy  Care Plan and Follow Up Patient Decision:  Patient agrees to Care Plan and Follow-up.  Plan: Telephone follow up appointment with care management team member scheduled for:  3 months  Jeni Salles, PharmD [place] Pharmacist [place] [facility] [place] [phone]

## 2021-08-13 DIAGNOSIS — F17200 Nicotine dependence, unspecified, uncomplicated: Secondary | ICD-10-CM

## 2021-08-13 DIAGNOSIS — H35039 Hypertensive retinopathy, unspecified eye: Secondary | ICD-10-CM

## 2021-08-13 NOTE — Patient Instructions (Signed)
Hi Lily,  It was great catching up with you! I will keep you and your sister in my prayers.   Please reach out to me if you have any questions or need anything before our follow up!  Best, Maddie  Jeni Salles, PharmD, [place] [facility] [place]   Visit Information   Goals Addressed   None    Patient Care Plan: CCM Pharmacy Care Plan     Problem Identified: Problem: Elevated blood pressure, tobacco use, allergic rhinitis, arthritis, prediabetes      Long-Range Goal: Patient-Specific Goal   Start Date: 02/02/2021  Expected End Date: 02/02/2022  Recent Progress: On track  Priority: High  Note:   Current Barriers:  Unable to independently monitor therapeutic efficacy  Pharmacist Clinical Goal(s):  Patient will achieve adherence to monitoring guidelines and medication adherence to achieve therapeutic efficacy through collaboration with PharmD and provider.    Interventions: 1:1 collaboration with Panosh, [name], MD regarding development and update of comprehensive plan of care as evidenced by provider attestation and co-signature Inter-disciplinary care team collaboration (see longitudinal plan of care) Comprehensive medication review performed; medication list updated in electronic medical record  Elevated blood pressure (BP goal <130/80) -Controlled -Current treatment: No medications -Medications previously tried: none  -Current home readings: 118/78 (sister's arm cuff) -Current dietary habits: does not cook with salt and doesn't eat red meat; eats mostly seafood, chicken or [place]; eats lots of vegetables and some fruit -Current exercise habits: limited with taking care of her sister -Denies hypotensive/hypertensive symptoms -Educated on Daily salt intake goal < 2300 mg; Exercise goal of 150 minutes per week; Importance of home blood pressure monitoring; -Counseled to monitor BP at home as directed, document, and  provide log at future appointments -Counseled on diet and exercise extensively  Pre-diabetes (A1c goal <6.5%) -Controlled -Current medications: No medications -Medications previously tried: none  -Current home glucose readings fasting glucose: does not check post prandial glucose: does not check -Denies hypoglycemic/hyperglycemic symptoms -Current meal patterns:  breakfast: n/a  lunch: n/a  dinner: n/a snacks: n/a drinks: n/a -Current exercise: n/a -Educated on A1c and blood sugar goals; Exercise goal of 150 minutes per week; Carbohydrate counting and/or plate method -Counseled to check feet daily and get yearly eye exams -Counseled on diet and exercise extensively  Tobacco use (Goal quit smoking) -Uncontrolled -Previous quit attempts: patches (too much nicotine), gum (flavor), quit cold [place] for about 6 months (6-7 years ago)  -Current treatment  No medications -Patient smokes After 30 minutes of waking -Patient triggers include:  habit and with breakfast -On a scale of 1-10, reports MOTIVATION to quit is 8 -On a scale of 1-10, reports CONFIDENCE in quitting is 8 -Counseled to allow lozenge to dissolve and absorb in cheek pocket, rather than swallow, to reduce GI side effects. Provided contact information for [place] Quit Line (1-800-QUIT-NOW) and encouraged patient to reach out to this group for support. -Counseled on using distractions to stop herself from picking up a cigarette and putting chewing gum on top of her cigarettes in her bag to give an added layer of distraction Recommended setting a goal of 1 less cigarette per day per month to see if she can slowly taper off and setting them aside in a plastic bag for each day. Educated on using the 2 mg dose of nicotine lozenges if she decides to start  Allergic rhinitis (Goal: minimize symptoms) -Controlled -Current treatment  fexofenadine (Allegra) 60mg , 1 tablet once daily  cetirizine (  Zyrtec) 10mg , 1 tablet once daily  (alternates antihistamines- does not take together)   fluticasone (Flonase) 72mcg/ act nasal spray, 2 sprays into both nostrils every night  -Medications previously tried: none  -Recommended to continue current medication Patient has been going to ENT every 3-4 months.  Arthritis (Goal: minimize pain) -Controlled -Current treatment  diclofenac 1% gel, apply 2 grams four times daily (uses as needed) -Medications previously tried: none  -Recommended to continue current medication   Health Maintenance -Vaccine gaps: none -Current therapy:  Docusate 100 mg 1 capsule every 12 hours Refresh dry eye as needed Multivitamin 1 tablet daily Vitamin C 500 mg 1 capsule daily Antifungal solution apply as needed to toenails Triamcinolone 0.1% ointment apply twice weekly Vitamin D 5000 every other day -Educated on Cost vs benefit of each product must be carefully weighed by individual consumer -Patient is satisfied with current therapy and denies issues -Recommended to continue current medication  Patient Goals/Self-Care Activities Patient will:  - take medications as prescribed check blood pressure as directed, document, and provide at future appointments target a minimum of 150 minutes of moderate intensity exercise weekly  Follow Up Plan: Telephone follow up appointment with care management team member scheduled for: 3 months       Patient verbalizes understanding of instructions provided today and agrees to view in [place].  Telephone follow up appointment with pharmacy team member scheduled for: 3 months  Viona Gilmore, [facility]

## 2021-08-16 ENCOUNTER — Telehealth: Payer: Self-pay | Admitting: Pharmacist

## 2021-08-16 NOTE — Chronic Care Management (AMB) (Signed)
Chronic Care Management Pharmacy Assistant   Name: Debra Woodward  MRN: [id number] DOB: [birth year]   Reason for Encounter: General Assessment Call   Conditions to be addressed/monitored: HTN   Recent office visits:  None  Recent consult visits:  None   Hospital visits:  None in previous 6 months  Medications: Outpatient Encounter Medications as of 08/16/2021  Medication Sig   Ascorbic Acid (VITAMIN C) 500 MG CAPS Take 1 capsule by mouth daily.   cephALEXin (KEFLEX) 500 MG capsule cephalexin 500 mg capsule  TAKE 1 CAPSULE BY MOUTH THREE TIMES DAILY   cetirizine (ZYRTEC) 10 MG tablet Take 10 mg by mouth daily. Patient alternates with Allegra   COVID-19 mRNA Vac-TriS, Pfizer, (PFIZER-BIONT COVID-19 VAC-TRIS) SUSP injection Inject into the muscle.   desonide (DESOWEN) 0.05 % cream desonide 0.05 % topical cream  APPLY CREAM TOPICALLY TWICE DAILY TO ITCHY AREA ON FACE NOT IN THE EYE   diclofenac sodium (VOLTAREN) 1 % GEL Apply 2 g topically 4 (four) times daily.   docusate sodium (COLACE) 100 MG capsule Take 1 capsule (100 mg total) by mouth every 12 (twelve) hours.   fexofenadine (ALLEGRA) 60 MG tablet Take 60 mg by mouth daily. Patient alternates with Zyrtec   fluticasone (FLONASE) 50 MCG/ACT nasal spray USE 2 SPRAY(S) IN EACH NOSTRIL ONCE DAILY AT  NIGHT   Glycerin-Polysorbate 80 (REFRESH DRY EYE THERAPY OP) Apply to eye.   methylPREDNISolone (MEDROL) 4 MG tablet methylprednisolone 4 mg tablets in a dose pack  TAKE BY MOUTH AS DIRECTED ON INSIDE OF PACKAGE (Patient not taking: Reported on 06/30/2021)   Multiple Vitamin (MULTIVITAMIN) tablet Take 1 tablet by mouth daily.   NONFORMULARY OR COMPOUNDED ITEM Apply 1-2 g topically daily. [facility]: Nail laquer   NONFORMULARY OR COMPOUNDED ITEM Antifungal solution: Terbinafine 3%, Fluconazole 2%, Tea Tree Oil 5%, Urea 10%, Ibuprofen 2% in DMSO suspension #34mL   ondansetron (ZOFRAN) 4 MG tablet ondansetron HCl 4 mg tablet   TAKE 1 TABLET BY MOUTH EVERY 8 HOURS AS NEEDED (Patient not taking: Reported on 06/30/2021)   triamcinolone ointment (KENALOG) 0.1 % Apply small amt twice per day x 14 days then twice weekly   VITAMIN D PO Take 5,000 Int'l Units by mouth every other day.   No facility-administered encounter medications on file as of 08/16/2021.   Reviewed chart prior to disease state call. Spoke with patient regarding BP  Recent Office Vitals: BP Readings from Last 3 Encounters:  03/22/21 118/70  11/23/20 118/78  09/23/20 (!) 150/88   Pulse Readings from Last 3 Encounters:  03/22/21 76  11/23/20 70  09/23/20 80    Wt Readings from Last 3 Encounters:  06/14/21 179 lb (81.2 kg)  03/22/21 179 lb (81.2 kg)  11/23/20 182 lb (82.6 kg)     Kidney Function Lab Results  Component Value Date/Time   CREATININE 0.74 09/23/2020 12:06 PM   CREATININE 0.67 09/23/2019 04:28 PM   CREATININE 0.70 06/26/2019 01:48 PM   CREATININE 0.88 03/10/2015 04:27 PM   GFR 105.91 09/23/2019 04:28 PM   GFRNONAA 83 09/23/2020 12:06 PM   GFRAA 96 09/23/2020 12:06 PM    BMP Latest Ref Rng & Units 09/23/2020 09/23/2019 06/26/2019  Glucose 65 - 99 mg/dL 111(H) 102(H) 108(H)  BUN 7 - 25 mg/dL 9 8 10   Creatinine 0.50 - 0.99 mg/dL 0.74 0.67 0.70  BUN/Creat Ratio 6 - 22 (calc) NOT APPLICABLE - -  Sodium 341 - 146 mmol/L 140 138 140  Potassium  3.5 - 5.3 mmol/L 4.6 4.1 3.5  Chloride 98 - 110 mmol/L 106 104 106  CO2 20 - 32 mmol/L 27 29 25   Calcium 8.6 - 10.4 mg/dL 10.4 9.8 9.6    Current antihypertensive regimen:  No medications  Call to patient she reports she is doing well, has not had any symptoms of higher blood pressure readings, is checking at home and remains under 130/80. She reports no recent hospital stays or concerns at this time.   Care Gaps: Flu Vaccine - Overdue AWV - 06-30-21 Foot Exam 05-31-21 BP 118/78 (11-2020) CCM 12/22  Star Rating Drugs: None  Ned Clines [place] Clinical Pharmacist  Assistant [phone]

## 2021-08-18 ENCOUNTER — Encounter (INDEPENDENT_AMBULATORY_CARE_PROVIDER_SITE_OTHER): Payer: Self-pay

## 2021-08-31 ENCOUNTER — Ambulatory Visit: Payer: Medicare HMO | Admitting: Podiatry

## 2021-08-31 ENCOUNTER — Other Ambulatory Visit: Payer: Self-pay

## 2021-08-31 ENCOUNTER — Encounter: Payer: Self-pay | Admitting: Podiatry

## 2021-08-31 DIAGNOSIS — M79674 Pain in right toe(s): Secondary | ICD-10-CM | POA: Diagnosis not present

## 2021-08-31 DIAGNOSIS — B351 Tinea unguium: Secondary | ICD-10-CM

## 2021-08-31 DIAGNOSIS — M79675 Pain in left toe(s): Secondary | ICD-10-CM

## 2021-09-05 NOTE — Progress Notes (Signed)
  Subjective:  Patient ID: Debra Woodward, female    DOB: [birth year],  MRN: [id number]  Carlynn Leduc presents to clinic today for painful thick toenails that are difficult to trim. Pain interferes with ambulation. Aggravating factors include wearing enclosed shoe gear. Pain is relieved with periodic professional debridement.  Patient relates no new pedal problems on today's visit. She is taking care of her sister who is ill.   PCP is Panosh, [name], MD , and last visit was 05/05/2021.  Allergies  Allergen Reactions   Bee Venom Hives   Prednisone Other (See Comments)    hallucinations    Review of Systems: Negative except as noted in the HPI. Objective:   Constitutional Kenyia Wambolt is a pleasant 70 y.o. African American female, WD, WN in NAD. AAO x 3.   Vascular Capillary fill time to digits <3 seconds b/l lower extremities. Palpable DP pulse(s) b/l lower extremities Palpable PT pulse(s) b/l lower extremities Pedal hair sparse. Lower extremity skin temperature gradient within normal limits. No pain with calf compression b/l. No edema noted b/l lower extremities. No cyanosis or clubbing noted.  Neurologic Normal speech. Oriented to person, place, and time. Protective sensation intact 5/5 intact bilaterally with 10g monofilament b/l.  Dermatologic Pedal skin is warm and supple b/l LE. No open wounds b/l LE. No interdigital macerations b/l lower extremities. Toenails 1-5 b/l elongated, discolored, dystrophic, thickened, crumbly with subungual debris and tenderness to dorsal palpation.  Orthopedic: Normal muscle strength 5/5 to all lower extremity muscle groups bilaterally. No gross bony deformities b/l lower extremities. Limited joint ROM to the right ankle.   Radiographs: None Assessment:   1. Pain due to onychomycosis of toenails of both feet    Plan:  Patient was evaluated and treated and all questions answered. Consent given for treatment as described below: -No new findings. No  new orders. -Mycotic toenails were debrided in length and girth with sterile nail nippers and dremel without iatrogenic bleeding. -Patient to report any pedal injuries to medical professional immediately. -Patient/POA to call should there be question/concern in the interim.  Return in about 3 months (around 12/01/2021).  Marzetta Board, DPM

## 2021-09-21 NOTE — Progress Notes (Signed)
Chief Complaint  Patient presents with   Annual Exam     HPI: Debra Woodward 70 y.o. comes in today for Preventive Medicare exam/ wellness visit .  Sis  gi cancer of some sort .  Rushing today .  Recently woidowed    Health Maintenance  Topic Date Due   COVID-19 Vaccine (5 - Booster for Pfizer series) 06/18/2021   MAMMOGRAM  01/15/2022   COLONOSCOPY (Pts 45-81yrs Insurance coverage will need to be confirmed)  10/16/2023   TETANUS/TDAP  01/26/2028   Pneumonia Vaccine 58+ Years old  Completed   INFLUENZA VACCINE  Completed   DEXA SCAN  Completed   Hepatitis C Screening  Completed   Zoster Vaccines- Shingrix  Completed   HPV VACCINES  Aged Out   Health Maintenance Review LIFESTYLE:  Exercise:  not as much recnetly Tobacco/ETS:y 1 pack per 5 days  Alcohol: r Sugar beverages:n Sleep:y Drug use: no HH:1     Hearing: ok Vision: sees eye doc getttin new one  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  No excess sun exposure. Sees dentist regularly. Depression: No anhedonia unusual crying or depressive symptoms Nutrition: Eats well balanced diet; adequate calcium and vitamin D. No swallowing chewing problems. Injury: no major injuries in the last six months. Other healthcare providers:  Reviewed today . Preventive parameters: up-to-date  Reviewed  ADLS:   There are no problems or need for assistance  driving, feeding, obtaining food, dressing, toileting and bathing, managing money using phone. She is independent.  Helping caretaker sister getting her to appointments among other things    ROS:  REST of 12 system review negative except as per HPI   Past Medical History:  Diagnosis Date   ALLERGIC RHINITIS 09/14/2006   Allergy    Fibroid    Hx of adenomatous colonic polyps 07/28/2015   Seasonal allergies     Family History  Problem Relation Age of Onset   Stroke Mother    Diabetes Mother    Diabetes Father    Hypertension Sister     Esophageal cancer Paternal Grandfather 80   Colon cancer Neg Hx    Stomach cancer Neg Hx    Rectal cancer Neg Hx    Breast cancer Neg Hx    Colon polyps Neg Hx     Social History   Socioeconomic History   Marital status: Single    Spouse name: Not on file   Number of children: Not on file   Years of education: Not on file   Highest education level: Not on file  Occupational History   Not on file  Tobacco Use   Smoking status: Every Day    Types: Cigarettes   Smokeless tobacco: Never  Vaping Use   Vaping Use: Never used  Substance and Sexual Activity   Alcohol use: Yes    Alcohol/week: 2.0 standard drinks    Types: 2 Glasses of wine per week    Comment: social   Drug use: No   Sexual activity: Yes    Birth control/protection: Surgical    Comment: HYST  Other Topics Concern   Not on file  Social History Narrative   Going to [facility] of 1   No pets       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    Social Determinants of Health   Financial Resource Strain: Low Risk    Difficulty of  Paying Living Expenses: Not hard at all  Food Insecurity: No Food Insecurity   Worried About [place] in the Last Year: Never true   Ran Out of Food in the Last Year: Never true  Transportation Needs: No Transportation Needs   Lack of Transportation (Medical): No   Lack of Transportation (Non-Medical): No  Physical Activity: Sufficiently Active   Days of Exercise per Week: 4 days   Minutes of Exercise per Session: 50 min  Stress: No Stress Concern Present   Feeling of Stress : Not at all  Social Connections: Moderately Integrated   Frequency of Communication with Friends and Family: Three times a week   Frequency of Social Gatherings with Friends and Family: Three times a week   Attends Religious Services: More than 4 times per year   Active Member of Clubs or Organizations: Yes   Attends Archivist Meetings: More than 4 times per year   Marital Status:  Divorced    Outpatient Encounter Medications as of 09/22/2021  Medication Sig   amLODipine (NORVASC) 2.5 MG tablet Take 1 tablet (2.5 mg total) by mouth daily. For high BP   Ascorbic Acid (VITAMIN C) 500 MG CAPS Take 1 capsule by mouth daily.   cephALEXin (KEFLEX) 500 MG capsule cephalexin 500 mg capsule  TAKE 1 CAPSULE BY MOUTH THREE TIMES DAILY   cetirizine (ZYRTEC) 10 MG tablet Take 10 mg by mouth daily. Patient alternates with Allegra   COVID-19 mRNA Vac-TriS, Pfizer, (PFIZER-BIONT COVID-19 VAC-TRIS) SUSP injection Inject into the muscle.   desonide (DESOWEN) 0.05 % cream desonide 0.05 % topical cream  APPLY CREAM TOPICALLY TWICE DAILY TO ITCHY AREA ON FACE NOT IN THE EYE   diclofenac sodium (VOLTAREN) 1 % GEL Apply 2 g topically 4 (four) times daily.   docusate sodium (COLACE) 100 MG capsule Take 1 capsule (100 mg total) by mouth every 12 (twelve) hours.   fexofenadine (ALLEGRA) 60 MG tablet Take 60 mg by mouth daily. Patient alternates with Zyrtec   fluticasone (FLONASE) 50 MCG/ACT nasal spray USE 2 SPRAY(S) IN EACH NOSTRIL ONCE DAILY AT  NIGHT   Glycerin-Polysorbate 80 (REFRESH DRY EYE THERAPY OP) Apply to eye.   methylPREDNISolone (MEDROL) 4 MG tablet    Multiple Vitamin (MULTIVITAMIN) tablet Take 1 tablet by mouth daily.   NONFORMULARY OR COMPOUNDED ITEM Apply 1-2 g topically daily. [facility]: Nail laquer   NONFORMULARY OR COMPOUNDED ITEM Antifungal solution: Terbinafine 3%, Fluconazole 2%, Tea Tree Oil 5%, Urea 10%, Ibuprofen 2% in DMSO suspension #41mL   ondansetron (ZOFRAN) 4 MG tablet    triamcinolone ointment (KENALOG) 0.1 % Apply small amt twice per day x 14 days then twice weekly   VITAMIN D PO Take 5,000 Int'l Units by mouth every other day.   No facility-administered encounter medications on file as of 09/22/2021.    EXAM:  BP (!) 176/94 (BP Location: Left Arm, Patient Position: Sitting, Cuff Size: Normal)   Pulse 70   Temp 98.8 F (37.1 C) (Oral)   Ht 5'  5" (1.651 m)   Wt 176 lb 12.8 oz (80.2 kg)   SpO2 98%   BMI 29.42 kg/m   Body mass index is 29.42 kg/m. Repeat bp right sitting 148/86 Physical Exam: Vital signs reviewed [id number] is a well-developed well-nourished alert cooperative   who appears stated age in no acute distress.  HEENT: normocephalic atraumatic , Eyes: PERRL conjunctiva clear, tenderness., Ears: no deformity EAC's clear TMs with normal landmarks. Mouth: masked  NECK: supple without masses, thyromegaly or bruits. CHEST/PULM:  Clear to auscultation and percussion breath sounds equal no wheeze , rales or rhonchi. CV: PMI is nondisplaced, S1 S2 no gallops, murmurs, rubs. Peripheral pulses are without delay.No JVD .  ABDOMEN: Bowel sounds normal nontender  No guard or rebound, no hepato splenomegal no CVA tenderness.   Extremtities:  No clubbing cyanosis or edema, no acute joint swelling or redness no focal atrophyknee arthritis  NEURO:  Oriented x3, cranial nerves 3-12 appear to be intact, no obvious focal weakness,gait within normal limits no abnormal reflexes or asymmetrical SKIN: No acute rashes normal turgor, color, no bruising or petechiae. PSYCH: Oriented, good eye contact, stress , cognition and judgment appear normal. LN: no cervical axillary inguinal adenopathy No noted deficits in memory, attention, and speech.   Lab Results  Component Value Date   WBC 6.2 09/22/2021   HGB 14.0 09/22/2021   HCT 41.5 09/22/2021   PLT 398.0 09/22/2021   GLUCOSE 92 09/22/2021   CHOL 169 09/22/2021   TRIG 70.0 09/22/2021   HDL 75.90 09/22/2021   LDLCALC 79 09/22/2021   ALT 15 09/22/2021   AST 20 09/22/2021   NA 141 09/22/2021   K 4.0 09/22/2021   CL 104 09/22/2021   CREATININE 0.76 09/22/2021   BUN 13 09/22/2021   CO2 28 09/22/2021   TSH 2.49 09/22/2021   INR 0.94 09/07/2010   HGBA1C 6.7 (H) 09/22/2021    ASSESSMENT AND PLAN:  Discussed the following assessment and plan:  Visit for preventive health  examination  Hypertensive retinopathy, unspecified laterality - Plan: Vitamin B12, C-reactive protein, Sedimentation rate, T4, free, TSH, Lipid panel, Hepatic function panel, Hemoglobin A1c, CBC with Differential/Platelet, Basic metabolic panel  Tobacco use disorder  Hyperglycemia - Plan: Vitamin B12, C-reactive protein, Sedimentation rate, T4, free, TSH, Lipid panel, Hepatic function panel, Hemoglobin A1c, CBC with Differential/Platelet, Basic metabolic panel  Stress - Help caretaking sister who has abdominal cancer.  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 She is aware that tobacco cessation is advised Would consider adding statin medicine based on her risk profile  and eye findings but we can talk about that when she comes back in or has follow-up. Discussed blood pressure monitoring to get a baseline stress reduction self-care. Not to mention preventive care today. Consider  carotid doppler  if never done Stress reduction strategies  Patient Care Team: Ronia Hazelett, [name], MD as PCP - General (Internal Medicine) Annamaria Boots Candiss Norse, NP (Inactive) as Nurse Practitioner (Obstetrics and Gynecology) Viona Gilmore, [facility] as  Pharmacist (Pharmacist)  Patient Instructions  Good to see you today  Repeat BP was 148/86 still too high .  Eye exam showed evidence of   HBP changes in blood vessels so need to be treated  Begin  low dose medication  every day .  Consideration of adding statin medication for cholesterol build up in arteries  that currently not causeing a symptoms but   medication may decrease risk of  future stroke and heart attack.   Stop tobacco to decrease  vascular risk .  Plan  ROV in 2-3 months  for BP check medication check or as needed.    Take care of yourself.    [name]. Lezli Danek M.D.

## 2021-09-22 ENCOUNTER — Ambulatory Visit (INDEPENDENT_AMBULATORY_CARE_PROVIDER_SITE_OTHER): Payer: Medicare HMO | Admitting: Internal Medicine

## 2021-09-22 ENCOUNTER — Encounter: Payer: Medicare HMO | Admitting: Internal Medicine

## 2021-09-22 ENCOUNTER — Encounter: Payer: Self-pay | Admitting: Internal Medicine

## 2021-09-22 ENCOUNTER — Other Ambulatory Visit: Payer: Self-pay

## 2021-09-22 VITALS — BP 176/94 | HR 70 | Temp 98.8°F | Ht 65.0 in | Wt 176.8 lb

## 2021-09-22 DIAGNOSIS — H34212 Partial retinal artery occlusion, left eye: Secondary | ICD-10-CM

## 2021-09-22 DIAGNOSIS — R739 Hyperglycemia, unspecified: Secondary | ICD-10-CM

## 2021-09-22 DIAGNOSIS — Z0000 Encounter for general adult medical examination without abnormal findings: Secondary | ICD-10-CM

## 2021-09-22 DIAGNOSIS — H35039 Hypertensive retinopathy, unspecified eye: Secondary | ICD-10-CM | POA: Diagnosis not present

## 2021-09-22 DIAGNOSIS — F439 Reaction to severe stress, unspecified: Secondary | ICD-10-CM

## 2021-09-22 DIAGNOSIS — Z79899 Other long term (current) drug therapy: Secondary | ICD-10-CM | POA: Diagnosis not present

## 2021-09-22 DIAGNOSIS — F17200 Nicotine dependence, unspecified, uncomplicated: Secondary | ICD-10-CM | POA: Diagnosis not present

## 2021-09-22 LAB — HEMOGLOBIN A1C: Hgb A1c MFr Bld: 6.7 % — ABNORMAL HIGH (ref 4.6–6.5)

## 2021-09-22 LAB — BASIC METABOLIC PANEL
BUN: 13 mg/dL (ref 6–23)
CO2: 28 mEq/L (ref 19–32)
Calcium: 10.1 mg/dL (ref 8.4–10.5)
Chloride: 104 mEq/L (ref 96–112)
Creatinine, Ser: 0.76 mg/dL (ref 0.40–1.20)
GFR: 79.64 mL/min (ref >60.00)
Glucose, Bld: 92 mg/dL (ref 70–99)
Potassium: 4 mEq/L (ref 3.5–5.1)
Sodium: 141 mEq/L (ref 135–145)

## 2021-09-22 LAB — CBC WITH DIFFERENTIAL/PLATELET
Basophils Absolute: 0 10*3/uL (ref 0.0–0.1)
Basophils Relative: 0.4 % (ref 0.0–3.0)
Eosinophils Absolute: 0.2 10*3/uL (ref 0.0–0.7)
Eosinophils Relative: 2.6 % (ref 0.0–5.0)
HCT: 41.5 % (ref 36.0–46.0)
Hemoglobin: 14 g/dL (ref 12.0–15.0)
Lymphocytes Relative: 38.5 % (ref 12.0–46.0)
Lymphs Abs: 2.4 10*3/uL (ref 0.7–4.0)
MCHC: 33.6 g/dL (ref 30.0–36.0)
MCV: 97.6 fl (ref 78.0–100.0)
Monocytes Absolute: 0.5 10*3/uL (ref 0.1–1.0)
Monocytes Relative: 7.5 % (ref 3.0–12.0)
Neutro Abs: 3.1 10*3/uL (ref 1.4–7.7)
Neutrophils Relative %: 51 % (ref 43.0–77.0)
Platelets: 398 10*3/uL (ref 150.0–400.0)
RBC: 4.26 Mil/uL (ref 3.87–5.11)
RDW: 14.4 % (ref 11.5–15.5)
WBC: 6.2 10*3/uL (ref 4.0–10.5)

## 2021-09-22 LAB — C-REACTIVE PROTEIN: CRP: <1 mg/dL (ref 0.5–20.0)

## 2021-09-22 LAB — HEPATIC FUNCTION PANEL
ALT: 15 U/L (ref 0–35)
AST: 20 U/L (ref 0–37)
Albumin: 4.8 g/dL (ref 3.5–5.2)
Alkaline Phosphatase: 92 U/L (ref 39–117)
Bilirubin, Direct: 0.1 mg/dL (ref 0.0–0.3)
Total Bilirubin: 0.4 mg/dL (ref 0.2–1.2)
Total Protein: 7.8 g/dL (ref 6.0–8.3)

## 2021-09-22 LAB — TSH: TSH: 2.49 u[IU]/mL (ref 0.35–5.50)

## 2021-09-22 LAB — LIPID PANEL
Cholesterol: 169 mg/dL (ref 0–200)
HDL: 75.9 mg/dL (ref >39.00)
LDL Cholesterol: 79 mg/dL (ref 0–99)
NonHDL: 93.45
Total CHOL/HDL Ratio: 2
Triglycerides: 70 mg/dL (ref 0.0–149.0)
VLDL: 14 mg/dL (ref 0.0–40.0)

## 2021-09-22 LAB — VITAMIN B12: Vitamin B-12: 690 pg/mL (ref 211–911)

## 2021-09-22 LAB — SEDIMENTATION RATE: Sed Rate: 22 mm/hr (ref 0–30)

## 2021-09-22 LAB — T4, FREE: Free T4: 0.81 ng/dL (ref 0.60–1.60)

## 2021-09-22 MED ORDER — AMLODIPINE BESYLATE 2.5 MG PO TABS: 2.5000 mg | ORAL_TABLET | Freq: Every day | ORAL | 1 refills | Status: DC

## 2021-09-22 NOTE — Patient Instructions (Addendum)
Good to see you today  Repeat BP was 148/86 still too high .  Eye exam showed evidence of   HBP changes in blood vessels so need to be treated  Begin low dose medication  every day .  Consideration of adding statin medication for cholesterol build up in arteries  that currently not causeing a symptoms but   medication may decrease risk of  future stroke and heart attack.   Stop tobacco to decrease  vascular risk .  Plan  ROV in 2-3 months  for BP check medication check or as needed.    Take care of yourself.

## 2021-09-23 ENCOUNTER — Telehealth: Payer: Self-pay

## 2021-09-23 DIAGNOSIS — Z83518 Family history of other specified eye disorder: Secondary | ICD-10-CM

## 2021-09-23 NOTE — Progress Notes (Signed)
Blood results show good cholesterol profile . A1c blood sugar u need to fu blood sugar at your next visit  otherwise labs are  good.  No change in plans   need BP control and FU  as we discussed

## 2021-09-23 NOTE — Telephone Encounter (Signed)
-----   Message from Burnis Medin, MD sent at 09/23/2021 11:56 AM EST ----- Debra Woodward  please order  carotid doppler  from vascular ,  for the dx history of retinal artery occulsion.   I didt talk with her about getting this tests at the visit ., please explain not an urgent test but a check for blood flow blockage  or narrowing  in the carotid arteries . Thanks so much

## 2021-10-01 NOTE — Telephone Encounter (Signed)
Imaging test has been ordered.

## 2021-10-04 ENCOUNTER — Telehealth: Payer: Self-pay

## 2021-10-04 NOTE — Telephone Encounter (Signed)
I spoke with the pt and she reported dizziness and blurred vision, x1 day, Pt reports stated she believes bp medication is causing side effect since she takes her medication at night before bedtime. Pt is currently taking Amlodipine 2.5 mg. Please advise.

## 2021-10-04 NOTE — Telephone Encounter (Signed)
If symptoms recur needs either a visit or urgent care.  Please get blood pressure monitor so we can check your blood pressure readings at home a  Make a virtual visit to discuss next step

## 2021-10-04 NOTE — Telephone Encounter (Signed)
Can stop medication in case .  Need  to  get BP readings   and pulse to [place] .  Can make    fu video visit  this week other is continueing .

## 2021-10-04 NOTE — Telephone Encounter (Signed)
Pt informed of the message and verbalized understanding. Pt BP was 132/80,  P: 66

## 2021-10-04 NOTE — Telephone Encounter (Signed)
I spoke with the pt and informed her of the message below. Virtual f/u visit scheduled for 11/30

## 2021-10-04 NOTE — Telephone Encounter (Signed)
Patient called stating she is having so blurring vision patient stated the B/p medication is causing dizziness and lightheaded and patient would like to know what she should do and would like a call back.

## 2021-10-05 ENCOUNTER — Encounter: Payer: Medicare HMO | Admitting: Internal Medicine

## 2021-10-06 ENCOUNTER — Ambulatory Visit
Admission: RE | Admit: 2021-10-06 | Discharge: 2021-10-06 | Disposition: A | Discharge status: Home / Self Care | Payer: Medicare HMO | Source: Ambulatory Visit | Attending: Internal Medicine | Admitting: Internal Medicine

## 2021-10-06 DIAGNOSIS — Z83518 Family history of other specified eye disorder: Secondary | ICD-10-CM

## 2021-10-06 DIAGNOSIS — I6521 Occlusion and stenosis of right carotid artery: Secondary | ICD-10-CM | POA: Diagnosis not present

## 2021-10-06 DIAGNOSIS — H349 Unspecified retinal vascular occlusion: Secondary | ICD-10-CM | POA: Diagnosis not present

## 2021-10-06 DIAGNOSIS — R42 Dizziness and giddiness: Secondary | ICD-10-CM | POA: Diagnosis not present

## 2021-10-06 DIAGNOSIS — H538 Other visual disturbances: Secondary | ICD-10-CM | POA: Diagnosis not present

## 2021-10-06 NOTE — Progress Notes (Signed)
Good news   there is only a slight amount of narrowing of the right carotid artery and the left is open.  We will discuss this at your follow-up visit next week.

## 2021-10-06 NOTE — Progress Notes (Signed)
I am not aware of a major problem but she can ask her pharmacist.

## 2021-10-13 ENCOUNTER — Other Ambulatory Visit (HOSPITAL_BASED_OUTPATIENT_CLINIC_OR_DEPARTMENT_OTHER): Payer: Self-pay

## 2021-10-13 ENCOUNTER — Telehealth (INDEPENDENT_AMBULATORY_CARE_PROVIDER_SITE_OTHER): Payer: Medicare HMO | Admitting: Internal Medicine

## 2021-10-13 ENCOUNTER — Encounter: Payer: Self-pay | Admitting: Internal Medicine

## 2021-10-13 ENCOUNTER — Ambulatory Visit: Payer: Medicare HMO | Attending: Internal Medicine

## 2021-10-13 VITALS — Ht 65.0 in | Wt 176.8 lb

## 2021-10-13 DIAGNOSIS — Z23 Encounter for immunization: Secondary | ICD-10-CM

## 2021-10-13 DIAGNOSIS — F17200 Nicotine dependence, unspecified, uncomplicated: Secondary | ICD-10-CM

## 2021-10-13 DIAGNOSIS — R030 Elevated blood-pressure reading, without diagnosis of hypertension: Secondary | ICD-10-CM

## 2021-10-13 DIAGNOSIS — H35039 Hypertensive retinopathy, unspecified eye: Secondary | ICD-10-CM

## 2021-10-13 DIAGNOSIS — Z79899 Other long term (current) drug therapy: Secondary | ICD-10-CM | POA: Diagnosis not present

## 2021-10-13 DIAGNOSIS — H34212 Partial retinal artery occlusion, left eye: Secondary | ICD-10-CM

## 2021-10-13 MED ORDER — PFIZER COVID-19 VAC BIVALENT 30 MCG/0.3ML IM SUSP
INTRAMUSCULAR | 0 refills | Status: DC
  Filled 2021-10-13: qty 0.3, 1d supply, fill #0

## 2021-10-13 NOTE — Progress Notes (Signed)
   Covid-19 Vaccination Clinic  Name:  Debra Woodward    MRN: [id number] DOB: 1950/11/23  10/13/2021  Debra Woodward was observed post Covid-19 immunization for 15 minutes without incident. She was provided with Vaccine Information Sheet and instruction to access the V-Safe system.   Debra Woodward was instructed to call 911 with any severe reactions post vaccine: Difficulty breathing  Swelling of face and throat  A fast heartbeat  A bad rash all over body  Dizziness and weakness   Immunizations Administered     Name Date Dose VIS Date Route   Pfizer Covid-19 Vaccine Bivalent Booster 10/13/2021  2:00 PM 0.3 mL 07/14/2021 Intramuscular   Manufacturer: [place]   Lot: [id number]   [place]: [phone]

## 2021-10-13 NOTE — Progress Notes (Signed)
Virtual Visit via Video Note  I connected withNAME@ on 10/13/21 at 12:30 PM EST by a video enabled telemedicine application and verified that I am speaking with the correct person using two identifiers. Location patient: medical office  Location provider:work office Persons participating in the virtual visit: patient, provider  [place] national recommendations  regarding COVID 19 pandemic   video visit is advised over in office visit for this patient.  Patient aware  of the limitations of evaluation and management by telemedicine and  availability of in person appointments. and agreed to proceed.   HPI: Debra Woodward presents for video visit follow-up possible side effect of medicine. She [facility] on her sister is getting a chemoinfusion. See past notes after about 10 days on the low-dose amlodipine she had a hard time getting up in the morning without being dizzy feeling like she had to crawl into the bathroom.  So she stopped the medicine and felt better she started it back after Thanksgiving.  Blood pressure was checked in the office and was slightly elevated see note by CMA She is taking the medicine in the evening and so far has not had a recurrence of symptoms.  She did get carotid ultrasound vascular study and record right less than 50% stenosis minimal plaque.  No comment was made about the type of plaque. She is very reluctant not interested in statin cholesterol medicine. Is a follow-up with the eye doctor next week in regard to the probable RA occlusion ROS: See pertinent positives and negatives per HPI.  Past Medical History:  Diagnosis Date   ALLERGIC RHINITIS 09/14/2006   Allergy    Fibroid    Hx of adenomatous colonic polyps 07/28/2015   Seasonal allergies     Past Surgical History:  Procedure Laterality Date   ABDOMINAL HYSTERECTOMY  1996   BREAST EXCISIONAL BIOPSY Left 1983   No scar seen   BREAST EXCISIONAL BIOPSY Right 1987   NO scar seen    BREAST SURGERY Bilateral 1983, 1996   benign   CARPAL TUNNEL RELEASE Right 2008   COLONOSCOPY  07/23/2015   CYSTECTOMY Bilateral 1983, 1988   breast; benign   KNEE ARTHROSCOPY Right 2010   TOE SURGERY Bilateral    TOTAL KNEE ARTHROPLASTY Right 2011    Family History  Problem Relation Age of Onset   Stroke Mother    Diabetes Mother    Diabetes Father    Hypertension Sister    Esophageal cancer Paternal Grandfather 39   Colon cancer Neg Hx    Stomach cancer Neg Hx    Rectal cancer Neg Hx    Breast cancer Neg Hx    Colon polyps Neg Hx     Social History   Tobacco Use   Smoking status: Every Day    Types: Cigarettes   Smokeless tobacco: Never  Vaping Use   Vaping Use: Never used  Substance Use Topics   Alcohol use: Yes    Alcohol/week: 2.0 standard drinks    Types: 2 Glasses of wine per week    Comment: social   Drug use: No      Current Outpatient Medications:    amLODipine (NORVASC) 2.5 MG tablet, Take 1 tablet (2.5 mg total) by mouth daily. For high BP, Disp: 90 tablet, Rfl: 1   Ascorbic Acid (VITAMIN C) 500 MG CAPS, Take 1 capsule by mouth daily., Disp: , Rfl:    aspirin 81 MG EC tablet, 1 by mouth daily or  3 d per week., Disp: 30 tablet, Rfl: 12   cephALEXin (KEFLEX) 500 MG capsule, cephalexin 500 mg capsule  TAKE 1 CAPSULE BY MOUTH THREE TIMES DAILY, Disp: , Rfl:    cetirizine (ZYRTEC) 10 MG tablet, Take 10 mg by mouth daily. Patient alternates with Allegra, Disp: , Rfl:    COVID-19 mRNA Vac-TriS, Pfizer, (PFIZER-BIONT COVID-19 VAC-TRIS) SUSP injection, Inject into the muscle., Disp: 0.3 mL, Rfl: 0   desonide (DESOWEN) 0.05 % cream, desonide 0.05 % topical cream  APPLY CREAM TOPICALLY TWICE DAILY TO ITCHY AREA ON FACE NOT IN THE EYE, Disp: , Rfl:    diclofenac sodium (VOLTAREN) 1 % GEL, Apply 2 g topically 4 (four) times daily., Disp: , Rfl:    docusate sodium (COLACE) 100 MG capsule, Take 1 capsule (100 mg total) by mouth every 12 (twelve) hours., Disp: 30  capsule, Rfl: 0   fexofenadine (ALLEGRA) 60 MG tablet, Take 60 mg by mouth daily. Patient alternates with Zyrtec, Disp: , Rfl:    fluticasone (FLONASE) 50 MCG/ACT nasal spray, USE 2 SPRAY(S) IN EACH NOSTRIL ONCE DAILY AT  NIGHT, Disp: 16 g, Rfl: 6   Glycerin-Polysorbate 80 (REFRESH DRY EYE THERAPY OP), Apply to eye., Disp: , Rfl:    methylPREDNISolone (MEDROL) 4 MG tablet, , Disp: , Rfl:    Multiple Vitamin (MULTIVITAMIN) tablet, Take 1 tablet by mouth daily., Disp: , Rfl:    NONFORMULARY OR COMPOUNDED ITEM, Apply 1-2 g topically daily. [facility]: Nail laquer, Disp: 120 each, Rfl: 3   NONFORMULARY OR COMPOUNDED ITEM, Antifungal solution: Terbinafine 3%, Fluconazole 2%, Tea Tree Oil 5%, Urea 10%, Ibuprofen 2% in DMSO suspension #34mL, Disp: 1 each, Rfl: 3   ondansetron (ZOFRAN) 4 MG tablet, , Disp: , Rfl:    triamcinolone ointment (KENALOG) 0.1 %, Apply small amt twice per day x 14 days then twice weekly, Disp: 30 g, Rfl: 1   VITAMIN D PO, Take 5,000 Int'l Units by mouth every other day., Disp: , Rfl:    COVID-19 mRNA bivalent vaccine, Pfizer, (PFIZER COVID-19 VAC BIVALENT) injection, Inject into the muscle., Disp: 0.3 mL, Rfl: 0  EXAM: BP Readings from Last 3 Encounters:  09/22/21 (!) 176/94  03/22/21 118/70  11/23/20 118/78    VITALS per patient if applicable:  GENERAL: alert, oriented, appears well and in no acute distress  HEENT: atraumatic, conjunttiva clear, no obvious abnormalities on inspection of external nose and ears  NECK: normal movements of the head and neck  LUNGS: on inspection no signs of respiratory distress, breathing rate appears normal, no obvious gross SOB, gasping or wheezing  CV: no obvious cyanosis  MS: moves all visible extremities without noticeable abnormality  PSYCH/NEURO: pleasant and cooperative, no obvious depression or anxiety, speech and thought processing grossly intact Lab Results  Component Value Date   WBC 6.2 09/22/2021   HGB 14.0  09/22/2021   HCT 41.5 09/22/2021   PLT 398.0 09/22/2021   GLUCOSE 92 09/22/2021   CHOL 169 09/22/2021   TRIG 70.0 09/22/2021   HDL 75.90 09/22/2021   LDLCALC 79 09/22/2021   ALT 15 09/22/2021   AST 20 09/22/2021   NA 141 09/22/2021   K 4.0 09/22/2021   CL 104 09/22/2021   CREATININE 0.76 09/22/2021   BUN 13 09/22/2021   CO2 28 09/22/2021   TSH 2.49 09/22/2021   INR 0.94 09/07/2010   HGBA1C 6.7 (H) 09/22/2021   FINDINGS: Criteria: Quantification of carotid stenosis is based on velocity parameters that correlate the residual  internal carotid diameter with NASCET-based stenosis levels, using the diameter of the distal internal carotid lumen as the denominator for stenosis measurement.   The following velocity measurements were obtained:   RIGHT   ICA:  83/11 cm/sec   CCA:  95/2 cm/sec   SYSTOLIC ICA/CCA RATIO:  1.0   ECA:  84 cm/sec   LEFT   ICA:  66/14 cm/sec   CCA:  84/1 cm/sec   SYSTOLIC ICA/CCA RATIO:  0.2   ECA:  60 cm/sec   RIGHT CAROTID ARTERY: Minimal plaque at the level of the carotid bulb and right ICA. Estimated right ICA stenosis is less than 50%.   RIGHT VERTEBRAL ARTERY: Antegrade flow with normal waveform and velocity.   LEFT CAROTID ARTERY: No significant focal plaque identified. No evidence left-sided carotid stenosis in the neck.   LEFT VERTEBRAL ARTERY: Antegrade flow with normal waveform and velocity.   IMPRESSION: Minimal plaque at the right carotid bifurcation. Estimated right ICA stenosis is less than 50%. No evidence of plaque or stenosis at the left carotid bifurcation.     Electronically Signed   By: Aletta Edouard M.D.   On: 10/06/2021 11:02 ASSESSMENT AND PLAN:  Discussed the following assessment and plan:    ICD-10-CM   1. Medication management  Z79.899     2. Hypertensive retinopathy, unspecified laterality  H35.039     3. 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 Elevated blood pressure  reading  R03.0     5. Tobacco use disorder  F17.200      Continue medicine as tolerated add low-dose aspirin 81 mg every day or 3 days a week she has no history of bleeding. Keep appointment in February has follow-up with [facility] pharmacy team next month. Counseled.  Again she is aware advised to stop tobacco. Her LDL is 79 and she is reluctant to take statin medicine .    Expectant management and discussion of plan and treatment with opportunity to ask questions and all were answered. The patient agreed with the plan and demonstrated an understanding of the instructions.   Advised to call back or seek an in-person evaluation if worsening  or having  further concerns  in interim. Return for when planned.    Shanon Ace, MD

## 2021-10-18 DIAGNOSIS — H35363 Drusen (degenerative) of macula, bilateral: Secondary | ICD-10-CM | POA: Diagnosis not present

## 2021-10-18 DIAGNOSIS — H04123 Dry eye syndrome of bilateral lacrimal glands: Secondary | ICD-10-CM | POA: Diagnosis not present

## 2021-10-18 DIAGNOSIS — H2513 Age-related nuclear cataract, bilateral: Secondary | ICD-10-CM | POA: Diagnosis not present

## 2021-10-18 DIAGNOSIS — H40023 Open angle with borderline findings, high risk, bilateral: Secondary | ICD-10-CM | POA: Diagnosis not present

## 2021-10-29 ENCOUNTER — Telehealth: Payer: Self-pay | Admitting: Pharmacist

## 2021-10-29 NOTE — Chronic Care Management (AMB) (Signed)
° ° °  Chronic Care Management Pharmacy Assistant   Name: Debra Woodward  MRN: [id number] DOB: 08-29-1951  10/29/21 APPOINTMENT REMINDER   Patient was reminded to have all medications, supplements and any blood glucose and blood pressure readings available for review with Jeni Salles, Pharm. D, for telephone visit on 11/02/21 at 4 :30.   Care Gaps: AWV - 8/22 BP 176/67 (11/9/22022)  Star Rating Drug: None  Any gaps in medications fill history? None    Medications: Outpatient Encounter Medications as of 10/29/2021  Medication Sig   amLODipine (NORVASC) 2.5 MG tablet Take 1 tablet (2.5 mg total) by mouth daily. For high BP   Ascorbic Acid (VITAMIN C) 500 MG CAPS Take 1 capsule by mouth daily.   aspirin 81 MG EC tablet 1 by mouth daily or 3 d per week.   cephALEXin (KEFLEX) 500 MG capsule cephalexin 500 mg capsule  TAKE 1 CAPSULE BY MOUTH THREE TIMES DAILY   cetirizine (ZYRTEC) 10 MG tablet Take 10 mg by mouth daily. Patient alternates with Allegra   COVID-19 mRNA bivalent vaccine, Pfizer, (PFIZER COVID-19 VAC BIVALENT) injection Inject into the muscle.   COVID-19 mRNA Vac-TriS, Pfizer, (PFIZER-BIONT COVID-19 VAC-TRIS) SUSP injection Inject into the muscle.   desonide (DESOWEN) 0.05 % cream desonide 0.05 % topical cream  APPLY CREAM TOPICALLY TWICE DAILY TO ITCHY AREA ON FACE NOT IN THE EYE   diclofenac sodium (VOLTAREN) 1 % GEL Apply 2 g topically 4 (four) times daily.   docusate sodium (COLACE) 100 MG capsule Take 1 capsule (100 mg total) by mouth every 12 (twelve) hours.   fexofenadine (ALLEGRA) 60 MG tablet Take 60 mg by mouth daily. Patient alternates with Zyrtec   fluticasone (FLONASE) 50 MCG/ACT nasal spray USE 2 SPRAY(S) IN EACH NOSTRIL ONCE DAILY AT  NIGHT   Glycerin-Polysorbate 80 (REFRESH DRY EYE THERAPY OP) Apply to eye.   methylPREDNISolone (MEDROL) 4 MG tablet    Multiple Vitamin (MULTIVITAMIN) tablet Take 1 tablet by mouth daily.   NONFORMULARY OR COMPOUNDED  ITEM Apply 1-2 g topically daily. [facility]: Nail laquer   NONFORMULARY OR COMPOUNDED ITEM Antifungal solution: Terbinafine 3%, Fluconazole 2%, Tea Tree Oil 5%, Urea 10%, Ibuprofen 2% in DMSO suspension #58mL   ondansetron (ZOFRAN) 4 MG tablet    triamcinolone ointment (KENALOG) 0.1 % Apply small amt twice per day x 14 days then twice weekly   VITAMIN D PO Take 5,000 Int'l Units by mouth every other day.   No facility-administered encounter medications on file as of 10/29/2021.      [facility] Clinical Pharmacist Assistant [phone]

## 2021-11-02 ENCOUNTER — Telehealth: Payer: Medicare HMO

## 2021-11-02 ENCOUNTER — Telehealth: Payer: Self-pay | Admitting: Pharmacist

## 2021-11-02 NOTE — Telephone Encounter (Signed)
°  Chronic Care Management   Outreach Note  11/02/2021 Name: Debra Woodward MRN: [id number] DOB: 1951/05/26  Referred by: Burnis Medin, MD  Patient had a phone appointment scheduled with clinical pharmacist today.  An unsuccessful telephone outreach was attempted today. The patient was referred to the pharmacist for assistance with care management and care coordination.   If possible, a message was left to return call to: [phone] or to [facility]: [place], PharmD, [facility]

## 2021-11-02 NOTE — Progress Notes (Deleted)
Chronic Care Management Pharmacy Note  11/02/2021 Name:  Debra Woodward MRN:  [id number] DOB:  1951/06/21  Summary: Patient continues to smoke BP has been at goal < 130/80 per home readings  Recommendations/Changes made from today's visit: -Recommended for patient to call quit line and provided phone number for supplies (lozenges) -Recommended getting a bag and separating out 4 cigarettes per day and slowly tapering off by 1 cigarette per month  Plan: -Follow up in 3 months  Subjective: Debra Woodward is an 70 y.o. year old female who is a primary patient of Panosh, [name], MD.  The CCM team was consulted for assistance with disease management and care coordination needs.    Engaged with patient by telephone for follow up visit in response to provider referral for pharmacy case management and/or care coordination services.   Consent to Services:  The patient was given information about Chronic Care Management services, agreed to services, and gave verbal consent prior to initiation of services.  Please see initial visit note for detailed documentation.   Patient Care Team: Panosh, [name], MD as PCP - General (Internal Medicine) Huel Cote, NP (Inactive) as Nurse Practitioner (Obstetrics and Gynecology) Viona Gilmore, [facility] as Pharmacist (Pharmacist)  Recent office visits: 10/13/21 Shanon Ace, MD: Patient presented for video visit for chronic conditions. Recommended aspirin 81 mg daily. A1c increased to 6.7%.  09/22/21 Shanon Ace, MD: Patient presented for annual exam. BP elevated. Prescribed amlodipine 2.5 mg daily.  06/30/21 Randel Pigg, LPN: [name]  06/14/21 Shanon Ace, MD: Patient presented for video visit for concerns for memory. Recommended checking blood pressure at home.   Recent consult visits: 08/31/21 Acquanetta Sit, DPM (podiatry): Patient presented for nail trim.  07/28/21 Melony Overly, MD (ENT): Patient presented for bilateral impacted cerumen removal.   11/23/20 Karma Ganja, NP (GYN): Patient presented for vaginitis. Recommended vaseline first and then triamcinolone.   08/25/20 Marny Lowenstein, NP (OBGYN): Patient presented for vaginal discharge. Wet prep was unremarkable with normal pelvic exam.   08/21/20 Acquanetta Sit, DPM (podiatry): Patient presented for toe nail removal.  Hospital visits: None in previous 6 months  Objective:  Lab Results  Component Value Date   CREATININE 0.76 09/22/2021   BUN 13 09/22/2021   GFR 79.64 09/22/2021   GFRNONAA 83 09/23/2020   GFRAA 96 09/23/2020   NA 141 09/22/2021   K 4.0 09/22/2021   CALCIUM 10.1 09/22/2021   CO2 28 09/22/2021   GLUCOSE 92 09/22/2021    Lab Results  Component Value Date/Time   HGBA1C 6.7 (H) 09/22/2021 12:01 PM   HGBA1C 6.2 (H) 09/23/2020 12:06 PM   GFR 79.64 09/22/2021 12:01 PM   GFR 105.91 09/23/2019 04:28 PM    Last diabetic Eye exam: No results found for: HMDIABEYEEXA  Last diabetic Foot exam: No results found for: HMDIABFOOTEX   Lab Results  Component Value Date   CHOL 169 09/22/2021   HDL 75.90 09/22/2021   LDLCALC 79 09/22/2021   TRIG 70.0 09/22/2021   CHOLHDL 2 09/22/2021    Hepatic Function Latest Ref Rng & Units 09/22/2021 09/23/2020 06/26/2019  Total Protein 6.0 - 8.3 g/dL 7.8 7.0 7.7  Albumin 3.5 - 5.2 g/dL 4.8 - 4.0  AST 0 - 37 U/L _0 ALT 0 - 35 U/L 15 13 44  Alk Phosphatase 39 - 117 U/L 92 - 73  Total Bilirubin 0.2 - 1.2 mg/dL 0.4 0.6 0.4  Bilirubin, Direct 0.0 - 0.3 mg/dL 0.1 0.1 -    Lab Results  Component Value Date/Time   TSH 2.49 09/22/2021 12:01 PM   TSH 2.41 09/23/2019 04:28 PM   FREET4 0.81 09/22/2021 12:01 PM    CBC Latest Ref Rng & Units 09/22/2021 09/23/2020 09/23/2019  WBC 4.0 - 10.5 K/uL 6.2 5.6 6.5  Hemoglobin 12.0 - 15.0 g/dL 14.0 14.3 12.9  Hematocrit 36.0 - 46.0 % 41.5 41.1 39.0  Platelets 150.0 - 400.0 K/uL 398.0 408(H) 371.0     Lab Results  Component Value Date/Time   VD25OH 22.44 (L) 03/11/2016 08:58 AM   VD25OH 23 (L) 03/10/2015 04:27 PM    Clinical ASCVD: No  The 10-year ASCVD risk score (Arnett DK, et al., 2019) is: 34.2%   Values used to calculate the score:     Age: 70 years     Sex: Female     Is Non-Hispanic African American: Yes     Diabetic: No     Tobacco smoker: Yes     Systolic Blood Pressure: 625 mmHg     Is BP treated: Yes     HDL Cholesterol: 75.9 mg/dL     Total Cholesterol: 169 mg/dL    Depression screen [facility] 2/9 06/30/2021 06/30/2021 06/22/2020  Decreased Interest 0 0 0  Down, Depressed, Hopeless 0 0 0  PHQ - 2 Score 0 0 0  Altered sleeping - - 0  Tired, decreased energy - - 0  Change in appetite - - 0  Feeling bad or failure about yourself  - - 0  Trouble concentrating - - 0  Moving slowly or fidgety/restless - - 0  Suicidal thoughts - - 0  PHQ-9 Score - - 0  Difficult doing work/chores - - Not difficult at all  Some recent data might be hidden      Social History   Tobacco Use  Smoking Status Every Day   Types: Cigarettes  Smokeless Tobacco Never   BP Readings from Last 3 Encounters:  09/22/21 (!) 176/94  03/22/21 118/70  11/23/20 118/78   Pulse Readings from Last 3 Encounters:  09/22/21 70  03/22/21 76  11/23/20 70   Wt Readings from Last 3 Encounters:  10/13/21 176 lb 12.8 oz (80.2 kg)  09/22/21 176 lb 12.8 oz (80.2 kg)  06/14/21 179 lb (81.2 kg)   BMI Readings from Last 3 Encounters:  10/13/21 29.42 kg/m  09/22/21 29.42 kg/m  06/14/21 30.73 kg/m    Assessment/Interventions: Review of patient past medical history, allergies, medications, health status, including review of consultants reports, laboratory and other test data, was performed as part of comprehensive evaluation and provision of chronic care management services.   SDOH:  (Social Determinants of Health) assessments and interventions performed: No   CCM Care Plan  Allergies  Allergen  Reactions   Bee Venom Hives   Prednisone Other (See Comments)    hallucinations    Medications Reviewed Today     Reviewed by Nilda Riggs, CMA (Certified Medical Assistant) on 10/13/21 at 1222  Med List Status: <None>   Medication Order Taking? Sig Documenting Provider Last Dose Status Informant  amLODipine (NORVASC) 2.5 MG tablet [id number] Yes Take 1 tablet (2.5 mg total) by mouth daily. For high BP Panosh, [name], MD Taking Active   Ascorbic Acid (VITAMIN C) 500 MG CAPS [id number] Yes Take 1 capsule by mouth daily. [provider] Taking Active   cephALEXin (KEFLEX) 500 MG capsule [id number] Yes cephalexin 500 mg capsule  TAKE 1 CAPSULE BY MOUTH THREE TIMES DAILY [provider] Taking Active   cetirizine (ZYRTEC) 10 MG tablet [id number] Yes Take 10 mg by mouth daily. Patient alternates with Allegra [provider] Taking Active   COVID-19 mRNA Vac-TriS, Pfizer, (PFIZER-BIONT COVID-19 VAC-TRIS) SUSP injection [id number] Yes Inject into the muscle. Carlyle Basques, MD Taking Active   desonide (DESOWEN) 0.05 % cream [id number] Yes desonide 0.05 % topical cream  APPLY CREAM TOPICALLY TWICE DAILY TO ITCHY AREA ON FACE NOT IN THE EYE [provider] Taking Active   diclofenac sodium (VOLTAREN) 1 % GEL [id number] Yes Apply 2 g topically 4 (four) times daily. [provider] Taking Active   docusate sodium (COLACE) 100 MG capsule [id number] Yes Take 1 capsule (100 mg total) by mouth every 12 (twelve) hours. Delia Heady, PA-C Taking Active   fexofenadine (ALLEGRA) 60 MG tablet [id number] Yes Take 60 mg by mouth daily. Patient alternates with Zyrtec [provider] Taking Active Self  fluticasone (FLONASE) 50 MCG/ACT nasal spray [id number] Yes USE 2 SPRAY(S) IN EACH NOSTRIL ONCE DAILY AT  Elie Confer, MD Taking Active   Glycerin-Polysorbate 80 [facility] DRY EYE THERAPY OP) [id number] Yes Apply to eye. [provider] Taking  Active   methylPREDNISolone (MEDROL) 4 MG tablet [id number] Yes  [provider] Taking Active   Multiple Vitamin (MULTIVITAMIN) tablet [id number] Yes Take 1 tablet by mouth daily. [provider] Taking Active Self  NONFORMULARY OR COMPOUNDED ITEM [id number] Yes Apply 1-2 g topically daily. [facility]: Nail laquer Marzetta Board, DPM Taking Active Self  NONFORMULARY OR COMPOUNDED ITEM [id number] Yes Antifungal solution: Terbinafine 3%, Fluconazole 2%, Tea Tree Oil 5%, Urea 10%, Ibuprofen 2% in DMSO suspension #43m GMarzetta Board DPM Taking Active   ondansetron (ZOFRAN) 4 MG tablet [id number]  [provider] Taking Active   triamcinolone ointment (KENALOG) 0.1 % [id number] Apply small amt twice per day x 14 days then twice weekly DKarma Ganja NP Taking Active   VITAMIN D PO [id number] Take 5,000 Int'l Units by mouth every other day. [provider] Taking Active             Patient Active Problem List   Diagnosis Date Noted   Arthralgia of left ankle 04/20/2021   Arthritis of both knees 11/15/2017   Aftercare 11/15/2017   Hx of adenomatous colonic polyps 07/28/2015   Degenerative disc disease, lumbar sacral 11/13/2012   Elevated blood pressure reading 11/13/2012   Allergic rhinitis 09/14/2006    Immunization History  Administered Date(s) Administered   Fluad Quad(high Dose 65+) 09/14/2021   Hep A / Hep B 01/25/2018, 02/27/2018, 08/10/2018   Influenza Split 08/07/2012, 08/23/2014   Influenza Whole 08/14/2001, 09/10/2007, 08/16/2010, 08/15/2011   Influenza, High Dose Seasonal PF 08/23/2017, 08/10/2018, 09/02/2019, 10/01/2020   Influenza,inj,Quad PF,6+ Mos 08/16/2016   Influenza-Unspecified 08/14/2013, 08/14/2017   PFIZER Comirnaty(Gray Top)Covid-19 Tri-Sucrose Vaccine 04/23/2021   PFIZER(Purple Top)SARS-COV-2 Vaccination 12/03/2019, 12/25/2019, 08/21/2020   Pfizer Covid-19 Vaccine Bivalent Booster 111yr& up 10/13/2021    Pneumococcal Conjugate-13 09/30/2016   Pneumococcal Polysaccharide-23 11/15/2011, 09/18/2018   Pneumococcal-Unspecified 07/15/2016   Td 03/13/2009   Tdap 01/25/2018   Zoster Recombinat (Shingrix) 03/22/2017, 12/11/2017   Zoster, Live 05/28/2012   Patient reports she has been putting herself on the back burners in terms of her health. Her sister GeMolli Poseys not doing well and is looking for prayers for her. She low her brother in law at the end of May  as well. She has been trying to stay level headed and patient. She has not picked up any additional cigarettes with added stress. Some days she is not smoking at all with being in doctor's appointments.   Conditions to be addressed/monitored:  Osteoarthritis, Tobacco use, Allergic Rhinitis and elevated blood pressure and pre-diabetes  Conditions addressed this visit: Tobacco use, elevated BP, allergic rhinitis ***  There are no care plans that you recently modified to display for this patient.   Medication Assistance: None required.  Patient affirms current coverage meets needs.  Compliance/Adherence/Medication fill history: Care Gaps: None  Star-Rating Drugs: None  Patient's preferred pharmacy is:  [place], [address] [place] [address] Phone: [phone] Fax: [phone]  [place], [address] [address] [place] [address] Phone: [phone] Fax: [phone]   Uses pill box? No - puts all of her medicines on her dresser Pt endorses 95% compliance  We discussed: Current pharmacy is preferred with insurance plan and patient is satisfied with pharmacy services Patient decided to: Continue current medication management strategy  Care Plan and Follow Up Patient Decision:  Patient agrees to Care Plan and Follow-up.  Plan: Telephone follow up appointment with care management team  member scheduled for:  3 months  Jeni Salles, PharmD [place] Pharmacist [facility] [phone]

## 2021-11-30 ENCOUNTER — Telehealth: Payer: Self-pay | Admitting: Internal Medicine

## 2021-11-30 NOTE — Telephone Encounter (Signed)
Patient is calling to schedule reschedule an appointment that she previously missed. The missed appointment was 11/02/2021.  Patient could be contacted at [phone].  Please advise.

## 2021-12-02 ENCOUNTER — Other Ambulatory Visit: Payer: Self-pay

## 2021-12-02 ENCOUNTER — Ambulatory Visit: Payer: Medicare HMO | Admitting: Nurse Practitioner

## 2021-12-02 ENCOUNTER — Encounter: Payer: Self-pay | Admitting: Nurse Practitioner

## 2021-12-02 VITALS — BP 134/84

## 2021-12-02 DIAGNOSIS — N898 Other specified noninflammatory disorders of vagina: Secondary | ICD-10-CM | POA: Diagnosis not present

## 2021-12-02 DIAGNOSIS — L853 Xerosis cutis: Secondary | ICD-10-CM | POA: Diagnosis not present

## 2021-12-02 DIAGNOSIS — Z113 Encounter for screening for infections with a predominantly sexual mode of transmission: Secondary | ICD-10-CM | POA: Diagnosis not present

## 2021-12-02 LAB — WET PREP FOR TRICH, YEAST, CLUE

## 2021-12-02 NOTE — Progress Notes (Addendum)
Call to patient to reschedule appointment was unable to reach left VM with my contact information for call back.     [place] Clinical Pharmacist Assistant [phone]

## 2021-12-02 NOTE — Progress Notes (Signed)
° °  Acute Office Visit  Subjective:    Patient ID: Debra Woodward, female    DOB: 1951/11/07, 71 y.o.   MRN: [id number]   HPI 71 y.o. presents today for vaginal discharge and itching on mons pubis. The itching began a couple of days ago and the discharge 1-2 weeks ago. Denies odor or vaginal itching. Just notices a wetness. She has had intermittent mons pubis itching in the past that has been well controlled with oil-based topical ointments. Sexually active. Would like STD screening today.    Review of Systems  Constitutional: Negative.   Genitourinary:  Positive for vaginal discharge.       No odor, no itching  Skin:        Itching on mons pubis      Objective:    Physical Exam Constitutional:      Appearance: Normal appearance.  Genitourinary:    General: Normal vulva.     Vagina: Vaginal discharge present. No erythema.     Comments: No redness or rash to mons pubis, dry skin   BP 134/84  Wt Readings from Last 3 Encounters:  10/13/21 176 lb 12.8 oz (80.2 kg)  09/22/21 176 lb 12.8 oz (80.2 kg)  06/14/21 179 lb (81.2 kg)   Wet prep negative     Assessment & Plan:   Problem List Items Addressed This Visit   None Visit Diagnoses     Vaginal discharge    -  Primary   Relevant Orders   WET PREP FOR [place], YEAST, CLUE (Completed)   Screen for STD (sexually transmitted disease)       Relevant Orders   C. trachomatis/N. gonorrhoeae RNA   RPR   HIV Antibody (routine testing w rflx)   Dry skin          Plan: Wet prep and exam negative. STD panel pending. Restart topical oil-based ointment such as vasoline or coconut oil for moisture on mons pubis. Avoid scratching. She is agreeable to plan.      [name] DNP, 2:29 PM 12/02/2021

## 2021-12-03 LAB — C. TRACHOMATIS/N. GONORRHOEAE RNA
C. trachomatis RNA, TMA: NOT DETECTED
N. gonorrhoeae RNA, TMA: NOT DETECTED

## 2021-12-03 LAB — RPR: RPR Ser Ql: NONREACTIVE

## 2021-12-03 LAB — HIV ANTIBODY (ROUTINE TESTING W REFLEX): HIV 1&2 Ab, 4th Generation: NONREACTIVE

## 2021-12-06 NOTE — Progress Notes (Signed)
Patient returned call offered appointment for 01/10/22, patient accepted.    [place] Clinical Pharmacist Assistant [phone]

## 2021-12-13 ENCOUNTER — Other Ambulatory Visit: Payer: Self-pay

## 2021-12-13 ENCOUNTER — Ambulatory Visit: Payer: Medicare HMO | Admitting: Podiatry

## 2021-12-13 DIAGNOSIS — M79675 Pain in left toe(s): Secondary | ICD-10-CM | POA: Diagnosis not present

## 2021-12-13 DIAGNOSIS — M79674 Pain in right toe(s): Secondary | ICD-10-CM | POA: Diagnosis not present

## 2021-12-13 DIAGNOSIS — B351 Tinea unguium: Secondary | ICD-10-CM

## 2021-12-17 DIAGNOSIS — Z9889 Other specified postprocedural states: Secondary | ICD-10-CM | POA: Diagnosis not present

## 2021-12-17 DIAGNOSIS — H61303 Acquired stenosis of external ear canal, unspecified, bilateral: Secondary | ICD-10-CM | POA: Diagnosis not present

## 2021-12-17 DIAGNOSIS — H6123 Impacted cerumen, bilateral: Secondary | ICD-10-CM | POA: Insufficient documentation

## 2021-12-17 DIAGNOSIS — H903 Sensorineural hearing loss, bilateral: Secondary | ICD-10-CM | POA: Insufficient documentation

## 2021-12-17 DIAGNOSIS — H9042 Sensorineural hearing loss, unilateral, left ear, with unrestricted hearing on the contralateral side: Secondary | ICD-10-CM | POA: Diagnosis not present

## 2021-12-19 ENCOUNTER — Encounter: Payer: Self-pay | Admitting: Podiatry

## 2021-12-19 NOTE — Progress Notes (Signed)
Subjective: Debra Woodward is a 71 y.o. female patient seen today for follow up of  painful elongated mycotic toenails 1-5 bilaterally which are tender when wearing enclosed shoe gear. Pain is relieved with periodic professional debridement..   New problem(s)/concern(s) today: None    PCP is Panosh, [name], MD. Last visit was: 10/13/2021.  Allergies  Allergen Reactions   Bee Venom Hives   Prednisone Other (See Comments)    hallucinations    Objective: Physical Exam  General: Patient is a pleasant 71 y.o. African American female in NAD. AAO x 3.   Neurovascular Examination: CFT <3 seconds b/l LE. Palpable DP/PT pulses b/l LE. Digital hair sparse b/l. Skin temperature gradient WNL b/l. No pain with calf compression b/l. No edema noted b/l. No cyanosis or clubbing noted b/l LE.  Proprioception intact bilaterally.  Dermatological:  Pedal integument with normal turgor, texture and tone b/l LE. No open wounds b/l. No interdigital macerations b/l. Toenails 1-5 b/l elongated, thickened, discolored with subungual debris. +Tenderness with dorsal palpation of nailplates. No hyperkeratotic or porokeratotic lesions present.  Musculoskeletal:  Muscle strength 5/5 to all lower extremity muscle groups bilaterally. Limited joint ROM to the right ankle. Patient ambulates independent of any assistive aids.  Assessment: 1. Pain due to onychomycosis of toenails of both feet    Plan: -Examined patient. -Toenails 1-5 b/l were debrided in length and girth with sterile nail nippers and dremel without iatrogenic bleeding.  -Patient/POA to call should there be question/concern in the interim.  Return in about 9 weeks (around 02/14/2022).  Marzetta Board, DPM

## 2021-12-27 ENCOUNTER — Ambulatory Visit: Payer: Medicare HMO | Admitting: Internal Medicine

## 2021-12-29 ENCOUNTER — Encounter: Payer: Self-pay | Admitting: Internal Medicine

## 2021-12-29 ENCOUNTER — Ambulatory Visit (INDEPENDENT_AMBULATORY_CARE_PROVIDER_SITE_OTHER): Payer: Medicare HMO | Admitting: Internal Medicine

## 2021-12-29 VITALS — BP 150/76 | HR 81 | Temp 98.6°F | Ht 65.0 in | Wt 183.6 lb

## 2021-12-29 DIAGNOSIS — R739 Hyperglycemia, unspecified: Secondary | ICD-10-CM | POA: Diagnosis not present

## 2021-12-29 DIAGNOSIS — F17200 Nicotine dependence, unspecified, uncomplicated: Secondary | ICD-10-CM | POA: Diagnosis not present

## 2021-12-29 DIAGNOSIS — H35039 Hypertensive retinopathy, unspecified eye: Secondary | ICD-10-CM

## 2021-12-29 DIAGNOSIS — Z79899 Other long term (current) drug therapy: Secondary | ICD-10-CM | POA: Diagnosis not present

## 2021-12-29 DIAGNOSIS — I10 Essential (primary) hypertension: Secondary | ICD-10-CM | POA: Diagnosis not present

## 2021-12-29 LAB — POCT GLYCOSYLATED HEMOGLOBIN (HGB A1C): Hemoglobin A1C: 6.4 % — AB (ref 4.0–5.6)

## 2021-12-29 MED ORDER — AMLODIPINE BESYLATE 5 MG PO TABS: 5.0000 mg | ORAL_TABLET | Freq: Every day | ORAL | 2 refills | Status: DC

## 2021-12-29 NOTE — Progress Notes (Signed)
Chief Complaint  Patient presents with   Follow-up    B/p    HPI: Debra Woodward 71 y.o. come in for follow-up of blood pressure and blood sugar  BP did bring in her own monitor now and readings has been taking amlodipine 2.5 mg at least 6 days a week.  Blood pressure readings in her log occasional 128 144 147 159/76 8485 range with pulse in the 70s and 80s. She has gained weight but over the last 1 to 2 weeks has made an effort to decreased sugar change diet eat less in the evening and has lost 1 pound. 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 Her sleep has been i affected sometimes will stay up and cannot get to sleep and sometimes has to get up at 6 or 5 in the morning other days on the weekends sleep in.  Hx or retinal artery occulsion Tobacco yes ROS: See pertinent positives and negatives per HPI.  Past Medical History:  Diagnosis Date   ALLERGIC RHINITIS 09/14/2006   Allergy    Fibroid    Hx of adenomatous colonic polyps 07/28/2015   Seasonal allergies     Family History  Problem Relation Age of Onset   Stroke Mother    Diabetes Mother    Diabetes Father    Hypertension Sister    Esophageal cancer Paternal Grandfather 81   Colon cancer Neg Hx    Stomach cancer Neg Hx    Rectal cancer Neg Hx    Breast cancer Neg Hx    Colon polyps Neg Hx     Social History   Socioeconomic History   Marital status: Single    Spouse name: Not on file   Number of children: Not on file   Years of education: Not on file   Highest education level: Bachelor's degree (e.g., BA, AB, BS)  Occupational History   Not on file  Tobacco Use   Smoking status: Every Day    Types: Cigarettes   Smokeless tobacco: Never  Vaping Use   Vaping Use: Never used  Substance and Sexual Activity   Alcohol use: Yes    Alcohol/week: 2.0 standard drinks    Types: 2 Glasses of wine per week    Comment: social   Drug use: No    Sexual activity: Yes    Birth control/protection: Surgical    Comment: HYST  Other Topics Concern   Not on file  Social History Narrative   Going to [facility] of 1   No pets       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    Social Determinants of Health   Financial Resource Strain: Low Risk    Difficulty of Paying Living Expenses: Not very hard  Food Insecurity: No Food Insecurity   Worried About Charity fundraiser in the Last Year: Never true   Ran Out of Food in the Last Year: Never true  Transportation Needs: No Transportation Needs   Lack of Transportation (Medical): No   Lack of Transportation (Non-Medical): No  Physical Activity: Sufficiently Active   Days of Exercise per Week: 3 days   Minutes of Exercise per Session: 60 min  Stress: No Stress Concern Present   Feeling of Stress : Only a little  Social Connections: Moderately Integrated   Frequency of Communication with Friends and Family: More than three  times a week   Frequency of Social Gatherings with Friends and Family: Once a week   Attends Religious Services: More than 4 times per year   Active Member of Clubs or Organizations: Patient refused   Attends Music therapist: More than 4 times per year   Marital Status: Divorced    Outpatient Medications Prior to Visit  Medication Sig Dispense Refill   Ascorbic Acid (VITAMIN C) 500 MG CAPS Take 1 capsule by mouth daily.     aspirin 81 MG EC tablet 1 by mouth daily or 3 d per week. 30 tablet 12   cetirizine (ZYRTEC) 10 MG tablet Take 10 mg by mouth daily. Patient alternates with Allegra     COVID-19 mRNA bivalent vaccine, Pfizer, (PFIZER COVID-19 VAC BIVALENT) injection Inject into the muscle. 0.3 mL 0   COVID-19 mRNA Vac-TriS, Pfizer, (PFIZER-BIONT COVID-19 VAC-TRIS) SUSP injection Inject into the muscle. 0.3 mL 0   desonide (DESOWEN) 0.05 % cream desonide 0.05 % topical cream  APPLY CREAM TOPICALLY TWICE DAILY TO ITCHY AREA ON FACE NOT IN THE  EYE     diclofenac sodium (VOLTAREN) 1 % GEL Apply 2 g topically 4 (four) times daily.     docusate sodium (COLACE) 100 MG capsule Take 1 capsule (100 mg total) by mouth every 12 (twelve) hours. 30 capsule 0   fexofenadine (ALLEGRA) 60 MG tablet Take 60 mg by mouth daily. Patient alternates with Zyrtec     fluticasone (FLONASE) 50 MCG/ACT nasal spray USE 2 SPRAY(S) IN EACH NOSTRIL ONCE DAILY AT  NIGHT 16 g 6   Glycerin-Polysorbate 80 (REFRESH DRY EYE THERAPY OP) Apply to eye.     methylPREDNISolone (MEDROL) 4 MG tablet      Multiple Vitamin (MULTIVITAMIN) tablet Take 1 tablet by mouth daily.     NONFORMULARY OR COMPOUNDED ITEM Apply 1-2 g topically daily. [facility]: Nail laquer 120 each 3   NONFORMULARY OR COMPOUNDED ITEM Antifungal solution: Terbinafine 3%, Fluconazole 2%, Tea Tree Oil 5%, Urea 10%, Ibuprofen 2% in DMSO suspension #40mL 1 each 3   ondansetron (ZOFRAN) 4 MG tablet      triamcinolone ointment (KENALOG) 0.1 % Apply small amt twice per day x 14 days then twice weekly 30 g 1   VITAMIN D PO Take 5,000 Int'l Units by mouth every other day.     amLODipine (NORVASC) 2.5 MG tablet Take 1 tablet (2.5 mg total) by mouth daily. For high BP 90 tablet 1   No facility-administered medications prior to visit.     EXAM:  BP (!) 150/76 (BP Location: Right Arm)    Pulse 81    Temp 98.6 F (37 C) (Oral)    Ht 5\' 5"  (1.651 m)    Wt 183 lb 9.6 oz (83.3 kg)    SpO2 97%    BMI 30.55 kg/m   Body mass index is 30.55 kg/m. Blood pressure reading large cuff her monitor 145/92 pulse 70 office cuff large 147/92 per physician performed. GENERAL: vitals reviewed and listed above, alert, oriented, appears well hydrated and in no acute distress HEENT: atraumatic, conjunctiva  clear, no obvious abnormalities on inspection of external nose and ears NECK: no obvious masses on inspection palpation  PSYCH: pleasant and cooperative, no obvious depression or anxiety Lab Results  Component Value  Date   WBC 6.2 09/22/2021   HGB 14.0 09/22/2021   HCT 41.5 09/22/2021   PLT 398.0 09/22/2021   GLUCOSE 92 09/22/2021   CHOL 169 09/22/2021  TRIG 70.0 09/22/2021   HDL 75.90 09/22/2021   LDLCALC 79 09/22/2021   ALT 15 09/22/2021   AST 20 09/22/2021   NA 141 09/22/2021   K 4.0 09/22/2021   CL 104 09/22/2021   CREATININE 0.76 09/22/2021   BUN 13 09/22/2021   CO2 28 09/22/2021   TSH 2.49 09/22/2021   INR 0.94 09/07/2010   HGBA1C 6.4 (A) 12/29/2021   BP Readings from Last 3 Encounters:  12/29/21 (!) 150/76  12/02/21 134/84  09/22/21 (!) 176/94    ASSESSMENT AND PLAN:  Discussed the following assessment and plan:  Essential hypertension  Hyperglycemia - Improved prediabetic follow-up 3 months continue lifestyle consider medicine if appropriate - Plan: POCT glycosylated hemoglobin (Hb A1C)  Hypertensive retinopathy, unspecified laterality  Medication management  Tobacco use disorder Has declined statins in past -Patient advised to return or notify health care team  if  new concerns arise. Time record review evaluation of monitor face-to-face with clinician counseling lifestyle intervention adjusting medicine and Lan for follow-up.  34 minutes Patient Instructions  Intensify lifestyle interventions. As  you are doing .   Increase dose of amlodipine to 5 mg per day . Yoyur BP  monitor is   accurate enough to  make management decisions.   BP goal below 130/80 average .   Plan rov in 3 months    if bp 160 above for a time  or bp not improving   over time  Plan fu earlier   Jg a1c is a bit better  6.4   Mariann Laster K. Rendy Lazard M.D.

## 2021-12-29 NOTE — Patient Instructions (Addendum)
Intensify lifestyle interventions. As  you are doing .   Increase dose of amlodipine to 5 mg per day . Yoyur BP  monitor is   accurate enough to  make management decisions.   BP goal below 130/80 average .   Plan rov in 3 months    if bp 160 above for a time  or bp not improving   over time  Plan fu earlier   Jg a1c is a bit better  6.4

## 2022-01-04 ENCOUNTER — Telehealth: Payer: Self-pay | Admitting: Pharmacist

## 2022-01-04 NOTE — Progress Notes (Signed)
° ° °  Chronic Care Management Pharmacy Assistant   Name: Debra Woodward  MRN: [id number] DOB: 04-01-1951  01/06/22 APPOINTMENT REMINDER   Called Patient No answer, left message of appointment on 01/10/22 at 11 via telephone visit with Jeni Salles, Pharm D.   Notified to have all medications, supplements, blood pressure and/or blood sugar logs available during appointment and to return call if need to reschedule.      Care Gaps: BP- 150/76 ( 12/29/21) AWV- 8/22 CCM- 2/23  Star Rating Drugs: None   Ned Clines CMA Clinical Pharmacist Assistant [phone]  Medications: Outpatient Encounter Medications as of 01/04/2022  Medication Sig   amLODipine (NORVASC) 5 MG tablet Take 1 tablet (5 mg total) by mouth daily.   Ascorbic Acid (VITAMIN C) 500 MG CAPS Take 1 capsule by mouth daily.   aspirin 81 MG EC tablet 1 by mouth daily or 3 d per week.   cetirizine (ZYRTEC) 10 MG tablet Take 10 mg by mouth daily. Patient alternates with Allegra   COVID-19 mRNA bivalent vaccine, Pfizer, (PFIZER COVID-19 VAC BIVALENT) injection Inject into the muscle.   COVID-19 mRNA Vac-TriS, Pfizer, (PFIZER-BIONT COVID-19 VAC-TRIS) SUSP injection Inject into the muscle.   desonide (DESOWEN) 0.05 % cream desonide 0.05 % topical cream  APPLY CREAM TOPICALLY TWICE DAILY TO ITCHY AREA ON FACE NOT IN THE EYE   diclofenac sodium (VOLTAREN) 1 % GEL Apply 2 g topically 4 (four) times daily.   docusate sodium (COLACE) 100 MG capsule Take 1 capsule (100 mg total) by mouth every 12 (twelve) hours.   fexofenadine (ALLEGRA) 60 MG tablet Take 60 mg by mouth daily. Patient alternates with Zyrtec   fluticasone (FLONASE) 50 MCG/ACT nasal spray USE 2 SPRAY(S) IN EACH NOSTRIL ONCE DAILY AT  NIGHT   Glycerin-Polysorbate 80 (REFRESH DRY EYE THERAPY OP) Apply to eye.   methylPREDNISolone (MEDROL) 4 MG tablet    Multiple Vitamin (MULTIVITAMIN) tablet Take 1 tablet by mouth daily.   NONFORMULARY OR COMPOUNDED ITEM Apply 1-2 g  topically daily. [facility]: Nail laquer   NONFORMULARY OR COMPOUNDED ITEM Antifungal solution: Terbinafine 3%, Fluconazole 2%, Tea Tree Oil 5%, Urea 10%, Ibuprofen 2% in DMSO suspension #39mL   ondansetron (ZOFRAN) 4 MG tablet    triamcinolone ointment (KENALOG) 0.1 % Apply small amt twice per day x 14 days then twice weekly   VITAMIN D PO Take 5,000 Int'l Units by mouth every other day.   No facility-administered encounter medications on file as of 01/04/2022.

## 2022-01-07 ENCOUNTER — Other Ambulatory Visit: Payer: Self-pay | Admitting: Physician Assistant

## 2022-01-07 DIAGNOSIS — H903 Sensorineural hearing loss, bilateral: Secondary | ICD-10-CM

## 2022-01-10 ENCOUNTER — Ambulatory Visit (INDEPENDENT_AMBULATORY_CARE_PROVIDER_SITE_OTHER): Payer: Medicare HMO | Admitting: Pharmacist

## 2022-01-10 DIAGNOSIS — I10 Essential (primary) hypertension: Secondary | ICD-10-CM

## 2022-01-10 DIAGNOSIS — F17200 Nicotine dependence, unspecified, uncomplicated: Secondary | ICD-10-CM

## 2022-01-10 NOTE — Progress Notes (Signed)
Chronic Care Management Pharmacy Note  01/10/2022 Name:  Debra Woodward MRN:  [id number] DOB:  1951-05-22  Summary: Patient continues to smoke about 4-5 cigs/day BP has not been at goal < 130/80 per home readings  Recommendations/Changes made from today's visit: -Recommended annual low dose lung CT scan based on current smoking status -Recommend adding hypertension diagnosis to problem list -Educated on proper BP monitoring technique  Plan: BP assessment in 1 month Follow up in 4 months  Subjective: Debra Woodward is an 71 y.o. year old female who is a primary patient of Panosh, [name], MD.  The CCM team was consulted for assistance with disease management and care coordination needs.    Engaged with patient by telephone for follow up visit in response to provider referral for pharmacy case management and/or care coordination services.   Consent to Services:  The patient was given information about Chronic Care Management services, agreed to services, and gave verbal consent prior to initiation of services.  Please see initial visit note for detailed documentation.   Patient Care Team: Panosh, [name], MD as PCP - General (Internal Medicine) Huel Cote, NP (Inactive) as Nurse Practitioner (Obstetrics and Gynecology) Viona Gilmore, [facility] as Pharmacist (Pharmacist)  Recent office visits: 12/29/21 Debra Ace, MD: Patient presented for chronic conditions follow up. Increased almodipine to 5 mg day. Follow up in 3 months.  10/13/21 Debra Ace, MD: Patient presented for video visit for possible medication side effect.  09/22/21 Debra Ace, MD: Patient presented for annual exam. BP elevated in office. Prescribed amlodipine 2.5 mg daily. A1c increased to 6.7%.  Recent consult visits: 12/17/21 Jolene Provost, PA-C (ENT): Patient presented for initial consult for ear cleaning and hearing loss.  12/13/21 Acquanetta Sit, DPM (podiatry): Patient presented for nail  trim.  10/18/21 Arnoldo Hooker (ophthalmology): Patient presented for eye exam. Unable to access notes.  08/31/21 Acquanetta Sit, DPM (podiatry): Patient presented for nail trim.  07/28/21 Melony Overly, MD (ENT): Patient presented for bilateral impacted cerumen removal.   12/02/20 Marny Lowenstein, NP (OBGYN): Patient presented for vaginal discharge. Screening for STDs was negative.  Hospital visits: None in previous 6 months  Objective:  Lab Results  Component Value Date   CREATININE 0.76 09/22/2021   BUN 13 09/22/2021   GFR 79.64 09/22/2021   GFRNONAA 83 09/23/2020   GFRAA 96 09/23/2020   NA 141 09/22/2021   K 4.0 09/22/2021   CALCIUM 10.1 09/22/2021   CO2 28 09/22/2021   GLUCOSE 92 09/22/2021    Lab Results  Component Value Date/Time   HGBA1C 6.4 (A) 12/29/2021 12:29 PM   HGBA1C 6.7 (H) 09/22/2021 12:01 PM   HGBA1C 6.2 (H) 09/23/2020 12:06 PM   GFR 79.64 09/22/2021 12:01 PM   GFR 105.91 09/23/2019 04:28 PM    Last diabetic Eye exam: No results found for: HMDIABEYEEXA  Last diabetic Foot exam: No results found for: HMDIABFOOTEX   Lab Results  Component Value Date   CHOL 169 09/22/2021   HDL 75.90 09/22/2021   LDLCALC 79 09/22/2021   TRIG 70.0 09/22/2021   CHOLHDL 2 09/22/2021    Hepatic Function Latest Ref Rng & Units 09/22/2021 09/23/2020 06/26/2019  Total Protein 6.0 - 8.3 g/dL 7.8 7.0 7.7  Albumin 3.5 - 5.2 g/dL 4.8 - 4.0  AST 0 - 37 U/L '20 16 27  ' ALT 0 - 35 U/L 15 13 44  Alk Phosphatase 39 - 117 U/L 92 - 73  Total Bilirubin 0.2 - 1.2 mg/dL 0.4  0.6 0.4  Bilirubin, Direct 0.0 - 0.3 mg/dL 0.1 0.1 -    Lab Results  Component Value Date/Time   TSH 2.49 09/22/2021 12:01 PM   TSH 2.41 09/23/2019 04:28 PM   FREET4 0.81 09/22/2021 12:01 PM    CBC Latest Ref Rng & Units 09/22/2021 09/23/2020 09/23/2019  WBC 4.0 - 10.5 K/uL 6.2 5.6 6.5  Hemoglobin 12.0 - 15.0 g/dL 14.0 14.3 12.9  Hematocrit 36.0 - 46.0 % 41.5 41.1 39.0  Platelets 150.0 - 400.0 K/uL 398.0  408(H) 371.0    Lab Results  Component Value Date/Time   VD25OH 22.44 (L) 03/11/2016 08:58 AM   VD25OH 23 (L) 03/10/2015 04:27 PM    Clinical ASCVD: No  The 10-year ASCVD risk score (Arnett DK, et al., 2019) is: 26.3%   Values used to calculate the score:     Age: 71 years     Sex: Female     Is Non-Hispanic African American: Yes     Diabetic: No     Tobacco smoker: Yes     Systolic Blood Pressure: 993 mmHg     Is BP treated: Yes     HDL Cholesterol: 75.9 mg/dL     Total Cholesterol: 169 mg/dL    Depression screen [facility] 2/9 12/29/2021 06/30/2021 06/30/2021  Decreased Interest 0 0 0  Down, Depressed, Hopeless 0 0 0  PHQ - 2 Score 0 0 0  Altered sleeping 1 - -  Tired, decreased energy 1 - -  Change in appetite 1 - -  Feeling bad or failure about yourself  1 - -  Trouble concentrating 1 - -  Moving slowly or fidgety/restless 1 - -  Suicidal thoughts 0 - -  PHQ-9 Score 6 - -  Difficult doing work/chores - - -  Some recent data might be hidden      Social History   Tobacco Use  Smoking Status Every Day   Types: Cigarettes  Smokeless Tobacco Never   BP Readings from Last 3 Encounters:  12/29/21 (!) 150/76  12/02/21 134/84  09/22/21 (!) 176/94   Pulse Readings from Last 3 Encounters:  12/29/21 81  09/22/21 70  03/22/21 76   Wt Readings from Last 3 Encounters:  12/29/21 183 lb 9.6 oz (83.3 kg)  10/13/21 176 lb 12.8 oz (80.2 kg)  09/22/21 176 lb 12.8 oz (80.2 kg)   BMI Readings from Last 3 Encounters:  12/29/21 30.55 kg/m  10/13/21 29.42 kg/m  09/22/21 29.42 kg/m    Assessment/Interventions: Review of patient past medical history, allergies, medications, health status, including review of consultants reports, laboratory and other test data, was performed as part of comprehensive evaluation and provision of chronic care management services.   SDOH:  (Social Determinants of Health) assessments and interventions performed: No   CCM Care Plan  Allergies   Allergen Reactions   Bee Venom Hives   Prednisone Other (See Comments)    hallucinations    Medications Reviewed Today     Reviewed by Burnis Medin, MD (Physician) on 12/29/21 at 1825  Med List Status: <None>   Medication Order Taking? Sig Documenting Provider Last Dose Status Informant  amLODipine (NORVASC) 5 MG tablet [id number] Yes Take 1 tablet (5 mg total) by mouth daily. Panosh, [name], MD  Active   Ascorbic Acid (VITAMIN C) 500 MG CAPS [id number] Yes Take 1 capsule by mouth daily. [provider] Taking Active   aspirin 81 MG EC tablet [id number] Yes 1 by mouth daily or 3  d per week. Panosh, [name], MD Taking Active   cetirizine (ZYRTEC) 10 MG tablet [id number] Yes Take 10 mg by mouth daily. Patient alternates with Allegra [provider] Taking Active   COVID-19 mRNA bivalent vaccine, Pfizer, (PFIZER COVID-19 VAC BIVALENT) injection [id number] Yes Inject into the muscle. Carlyle Basques, MD Taking Active   COVID-19 mRNA Vac-TriS, Pfizer, (PFIZER-BIONT COVID-19 VAC-TRIS) SUSP injection [id number] Yes Inject into the muscle. Carlyle Basques, MD Taking Active   desonide (DESOWEN) 0.05 % cream [id number] Yes desonide 0.05 % topical cream  APPLY CREAM TOPICALLY TWICE DAILY TO ITCHY AREA ON FACE NOT IN THE EYE [provider] Taking Active   diclofenac sodium (VOLTAREN) 1 % GEL [id number] Yes Apply 2 g topically 4 (four) times daily. [provider] Taking Active   docusate sodium (COLACE) 100 MG capsule [id number] Yes Take 1 capsule (100 mg total) by mouth every 12 (twelve) hours. Delia Heady, PA-C Taking Active   fexofenadine (ALLEGRA) 60 MG tablet [id number] Yes Take 60 mg by mouth daily. Patient alternates with Zyrtec [provider] Taking Active Self  fluticasone (FLONASE) 50 MCG/ACT nasal spray [id number] Yes USE 2 SPRAY(S) IN EACH NOSTRIL ONCE DAILY AT  Elie Confer, MD Taking Active   Glycerin-Polysorbate 80 [facility] DRY EYE  THERAPY OP) [id number] Yes Apply to eye. [provider] Taking Active   methylPREDNISolone (MEDROL) 4 MG tablet [id number] Yes  [provider] Taking Active   Multiple Vitamin (MULTIVITAMIN) tablet [id number] Yes Take 1 tablet by mouth daily. [provider] Taking Active Self  NONFORMULARY OR COMPOUNDED ITEM [id number] Yes Apply 1-2 g topically daily. [facility]: Nail laquer Marzetta Board, DPM Taking Active Self  NONFORMULARY OR COMPOUNDED ITEM [id number] Yes Antifungal solution: Terbinafine 3%, Fluconazole 2%, Tea Tree Oil 5%, Urea 10%, Ibuprofen 2% in DMSO suspension #38m GMarzetta Board DPM Taking Active   ondansetron (ZOFRAN) 4 MG tablet [id number]  [provider] Taking Active   triamcinolone ointment (KENALOG) 0.1 % [id number] Apply small amt twice per day x 14 days then twice weekly DKarma Ganja NP Taking Active   VITAMIN D PO [id number] Take 5,000 Int'l Units by mouth every other day. [provider] Taking Active             Patient Active Problem List   Diagnosis Date Noted   Acquired stenosis of both external ear canals 12/17/2021   Excessive cerumen in both ear canals 12/17/2021   Sensorineural hearing loss (SNHL), bilateral 12/17/2021   Arthralgia of left ankle 04/20/2021   Arthritis of both knees 11/15/2017   Aftercare 11/15/2017   Hx of adenomatous colonic polyps 07/28/2015   Degenerative disc disease, lumbar sacral 11/13/2012   Elevated blood pressure reading 11/13/2012   Allergic rhinitis 09/14/2006    Immunization History  Administered Date(s) Administered   Fluad Quad(high Dose 65+) 09/14/2021   Hep A / Hep B 01/25/2018, 02/27/2018, 08/10/2018   Influenza Split 08/07/2012, 08/23/2014   Influenza Whole 08/14/2001, 09/10/2007, 08/16/2010, 08/15/2011   Influenza, High Dose Seasonal PF 08/23/2017, 08/10/2018, 09/02/2019, 10/01/2020   Influenza,inj,Quad PF,6+ Mos 08/16/2016    Influenza-Unspecified 08/14/2013, 08/14/2017   PFIZER Comirnaty(Gray Top)Covid-19 Tri-Sucrose Vaccine 04/23/2021   PFIZER(Purple Top)SARS-COV-2 Vaccination 12/03/2019, 12/25/2019, 08/21/2020   Pfizer Covid-19 Vaccine Bivalent Booster 145yr& up 10/13/2021   Pneumococcal Conjugate-13 09/30/2016   Pneumococcal Polysaccharide-23 11/15/2011, 09/18/2018   Pneumococcal-Unspecified 07/15/2016   Td 03/13/2009   Tdap 01/25/2018   Zoster Recombinat (Shingrix) 03/22/2017, 12/11/2017  Zoster, Live 05/28/2012   Patient reports her schedule is crazy with her sister and taking her to appointments with chemo and it has been really tough. Her sister fell and broke her shoulder and has been bringing her to appointments. Patient needs to take care of herself in order to take care of her sister. Patient reports she has been trying to take care of herself a little better. She has been taking the weekends to herself and she has been chilling out of the weekends. 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  Conditions to be addressed/monitored:  Osteoarthritis, Tobacco use, Allergic Rhinitis and elevated blood pressure and pre-diabetes  Conditions addressed this visit: Tobacco use, hypertension  Care Plan : CCM Pharmacy Care Plan  Updates made by Viona Gilmore, [place] since 01/10/2022 12:00 AM     Problem: Problem: Hypertension, tobacco use, allergic rhinitis, arthritis, prediabetes      Long-Range Goal: Patient-Specific Goal   Start Date: 02/02/2021  Expected End Date: 02/02/2022  Recent Progress: On track  Priority: High  Note:   Current Barriers:  Unable to independently monitor therapeutic efficacy  Pharmacist Clinical Goal(s):  Patient will achieve adherence to monitoring guidelines and medication adherence to achieve therapeutic efficacy through collaboration with PharmD and provider.    Interventions: 1:1 collaboration with Panosh, [name], MD regarding  development and update of comprehensive plan of care as evidenced by provider attestation and co-signature Inter-disciplinary care team collaboration (see longitudinal plan of care) Comprehensive medication review performed; medication list updated in electronic medical record  Hypertension (BP goal <130/80) -Controlled -Current treatment: Amlodipine 5 mg 1 tablet daily - Appropriate, Query effective, Safe, Accessible -Medications previously tried: none  -Current home readings: 130s/80; 132/89 today; checking daily at different times (sister's arm cuff) -Current dietary habits: does not cook with salt and doesn't eat red meat; eats mostly seafood, chicken or [place]; eats lots of vegetables and some fruit -Current exercise habits: limited with taking care of her sister -Denies hypotensive/hypertensive symptoms -Educated on Daily salt intake goal < 2300 mg; Exercise goal of 150 minutes per week; Importance of home blood pressure monitoring; -Counseled to monitor BP at home as directed, document, and provide log at future appointments -Counseled on diet and exercise extensively  Pre-diabetes (A1c goal <6.5%) -Controlled -Current medications: No medications -Medications previously tried: none  -Current home glucose readings fasting glucose: does not check post prandial glucose: does not check -Denies hypoglycemic/hyperglycemic symptoms -Current meal patterns:  breakfast: n/a  lunch: n/a  dinner: n/a snacks: n/a drinks: n/a -Current exercise: n/a -Educated on A1c and blood sugar goals; Exercise goal of 150 minutes per week; Carbohydrate counting and/or plate method -Counseled to check feet daily and get yearly eye exams -Counseled on diet and exercise extensively  Tobacco use (Goal quit smoking) -Uncontrolled -Previous quit attempts: patches (too much nicotine), gum (flavor), quit cold [place] for about 6 months (6-7 years ago)  -Current treatment  No medications -Patient  smokes After 30 minutes of waking -Patient triggers include:  habit and with breakfast -On a scale of 1-10, reports MOTIVATION to quit is 7 -On a scale of 1-10, reports CONFIDENCE in quitting is 7 -Counseled to allow lozenge to dissolve and absorb in cheek pocket, rather than swallow, to reduce GI side effects. Provided contact information for [place] Quit Line (1-800-QUIT-NOW) and encouraged patient to reach out to this group for support. -Counseled on using distractions to stop herself from picking  up a cigarette and putting chewing gum on top of her cigarettes in her bag to give an added layer of distraction Recommended setting a goal of 1 less cigarette per day per month to see if she can slowly taper off and setting them aside in a plastic bag for each day. Educated on using the 2 mg dose of nicotine lozenges if she decides to start  Allergic rhinitis (Goal: minimize symptoms) -Controlled -Current treatment  fexofenadine (Allegra) 27m, 1 tablet once daily  - Appropriate, Effective, Safe, Accessible cetirizine (Zyrtec) 190m 1 tablet once daily (alternates antihistamines- does not take together)  - Appropriate, Effective, Safe, Accessible fluticasone (Flonase) 5062m act nasal spray, 2 sprays into both nostrils every night - Appropriate, Effective, Safe, Accessible -Medications previously tried: none  -Recommended to continue current medication Patient has been going to ENT every 3-4 months.  Arthritis (Goal: minimize pain) -Controlled -Current treatment  diclofenac 1% gel, apply 2 grams four times daily (uses as needed) - Appropriate, Effective, Safe, Accessible -Medications previously tried: none  -Recommended to continue current medication   Health Maintenance -Vaccine gaps: none -Current therapy:  Docusate 100 mg 1 capsule every 12 hours Refresh dry eye as needed Multivitamin 1 tablet daily Vitamin C 500 mg 1 capsule daily Antifungal solution apply as needed to  toenails Triamcinolone 0.1% ointment apply twice weekly Vitamin D 5000 every other day -Educated on Cost vs benefit of each product must be carefully weighed by individual consumer -Patient is satisfied with current therapy and denies issues -Recommended to continue current medication  Patient Goals/Self-Care Activities Patient will:  - take medications as prescribed check blood pressure as directed, document, and provide at future appointments target a minimum of 150 minutes of moderate intensity exercise weekly  Follow Up Plan: Telephone follow up appointment with care management team member scheduled for: 4 months       Medication Assistance: None required.  Patient affirms current coverage meets needs.  Compliance/Adherence/Medication fill history: Care Gaps: None  Star-Rating Drugs: None  Patient's [facility] pharmacy is:  [facility] [address] [place]one: [phone]: [phone] [address] [place]one: [phone]: [phone] pill box? No - puts all of her medicines on her dresser Pt endorses 95% compliance  We discussed: Current pharmacy is preferred with insurance plan and patient is satisfied with pharmacy services Patient decided to: Continue current medication management strategy  Care Plan and Follow Up Patient Decision:  Patient agrees to Care Plan and Follow-up.  Plan: Telephone follow up appointment with care management team member scheduled for:  4 months  MadJeni SallesharmD BCAWoodlawn Heightsarmacist LeBRapid [place] [phone]

## 2022-01-10 NOTE — Patient Instructions (Signed)
Hi Debra Woodward,  It was great to catch up with you again!  Please reach out to me if you have any questions or need anything before our follow up!  Best, Maddie  Debra Woodward, PharmD, [place] [facility] [place]   Visit Information   Goals Addressed   None    Patient Care Plan: CCM Pharmacy Care Plan     Problem Identified: Problem: Hypertension, tobacco use, allergic rhinitis, arthritis, prediabetes      Long-Range Goal: Patient-Specific Goal   Start Date: 02/02/2021  Expected End Date: 02/02/2022  Recent Progress: On track  Priority: High  Note:   Current Barriers:  Unable to independently monitor therapeutic efficacy  Pharmacist Clinical Goal(s):  Patient will achieve adherence to monitoring guidelines and medication adherence to achieve therapeutic efficacy through collaboration with PharmD and provider.    Interventions: 1:1 collaboration with Panosh, [name], MD regarding development and update of comprehensive plan of care as evidenced by provider attestation and co-signature Inter-disciplinary care team collaboration (see longitudinal plan of care) Comprehensive medication review performed; medication list updated in electronic medical record  Hypertension (BP goal <130/80) -Controlled -Current treatment: Amlodipine 5 mg 1 tablet daily - Appropriate, Query effective, Safe, Accessible -Medications previously tried: none  -Current home readings: 130s/80; 132/89 today; checking daily at different times (sister's arm cuff) -Current dietary habits: does not cook with salt and doesn't eat red meat; eats mostly seafood, chicken or [place]; eats lots of vegetables and some fruit -Current exercise habits: limited with taking care of her sister -Denies hypotensive/hypertensive symptoms -Educated on Daily salt intake goal < 2300 mg; Exercise goal of 150 minutes per week; Importance of home blood pressure  monitoring; -Counseled to monitor BP at home as directed, document, and provide log at future appointments -Counseled on diet and exercise extensively  Pre-diabetes (A1c goal <6.5%) -Controlled -Current medications: No medications -Medications previously tried: none  -Current home glucose readings fasting glucose: does not check post prandial glucose: does not check -Denies hypoglycemic/hyperglycemic symptoms -Current meal patterns:  breakfast: n/a  lunch: n/a  dinner: n/a snacks: n/a drinks: n/a -Current exercise: n/a -Educated on A1c and blood sugar goals; Exercise goal of 150 minutes per week; Carbohydrate counting and/or plate method -Counseled to check feet daily and get yearly eye exams -Counseled on diet and exercise extensively  Tobacco use (Goal quit smoking) -Uncontrolled -Previous quit attempts: patches (too much nicotine), gum (flavor), quit cold [place] for about 6 months (6-7 years ago)  -Current treatment  No medications -Patient smokes After 30 minutes of waking -Patient triggers include:  habit and with breakfast -On a scale of 1-10, reports MOTIVATION to quit is 7 -On a scale of 1-10, reports CONFIDENCE in quitting is 7 -Counseled to allow lozenge to dissolve and absorb in cheek pocket, rather than swallow, to reduce GI side effects. Provided contact information for [place] Quit Line (1-800-QUIT-NOW) and encouraged patient to reach out to this group for support. -Counseled on using distractions to stop herself from picking up a cigarette and putting chewing gum on top of her cigarettes in her bag to give an added layer of distraction Recommended setting a goal of 1 less cigarette per day per month to see if she can slowly taper off and setting them aside in a plastic bag for each day. Educated on using the 2 mg dose of nicotine lozenges if she decides to start  Allergic rhinitis (Goal: minimize symptoms) -Controlled -Current treatment  fexofenadine (Allegra)  60mg , 1  tablet once daily  - Appropriate, Effective, Safe, Accessible cetirizine (Zyrtec) 10mg , 1 tablet once daily (alternates antihistamines- does not take together)  - Appropriate, Effective, Safe, Accessible fluticasone (Flonase) 34mcg/ act nasal spray, 2 sprays into both nostrils every night - Appropriate, Effective, Safe, Accessible -Medications previously tried: none  -Recommended to continue current medication Patient has been going to ENT every 3-4 months.  Arthritis (Goal: minimize pain) -Controlled -Current treatment  diclofenac 1% gel, apply 2 grams four times daily (uses as needed) - Appropriate, Effective, Safe, Accessible -Medications previously tried: none  -Recommended to continue current medication   Health Maintenance -Vaccine gaps: none -Current therapy:  Docusate 100 mg 1 capsule every 12 hours Refresh dry eye as needed Multivitamin 1 tablet daily Vitamin C 500 mg 1 capsule daily Antifungal solution apply as needed to toenails Triamcinolone 0.1% ointment apply twice weekly Vitamin D 5000 every other day -Educated on Cost vs benefit of each product must be carefully weighed by individual consumer -Patient is satisfied with current therapy and denies issues -Recommended to continue current medication  Patient Goals/Self-Care Activities Patient will:  - take medications as prescribed check blood pressure as directed, document, and provide at future appointments target a minimum of 150 minutes of moderate intensity exercise weekly  Follow Up Plan: Telephone follow up appointment with care management team member scheduled for: 4 months       Patient verbalizes understanding of instructions and care plan provided today and agrees to view in [place]. Active MyChart status confirmed with patient.   The pharmacy team will reach out to the patient again over the next 30 days.   Viona Gilmore, [facility]

## 2022-01-11 DIAGNOSIS — I10 Essential (primary) hypertension: Secondary | ICD-10-CM | POA: Diagnosis not present

## 2022-01-11 DIAGNOSIS — F17200 Nicotine dependence, unspecified, uncomplicated: Secondary | ICD-10-CM

## 2022-01-22 ENCOUNTER — Ambulatory Visit
Admission: RE | Admit: 2022-01-22 | Discharge: 2022-01-22 | Disposition: A | Discharge status: Home / Self Care | Payer: Medicare HMO | Source: Ambulatory Visit | Attending: Physician Assistant | Admitting: Physician Assistant

## 2022-01-22 ENCOUNTER — Other Ambulatory Visit: Payer: Self-pay

## 2022-01-22 DIAGNOSIS — H9042 Sensorineural hearing loss, unilateral, left ear, with unrestricted hearing on the contralateral side: Secondary | ICD-10-CM | POA: Diagnosis not present

## 2022-01-22 DIAGNOSIS — H903 Sensorineural hearing loss, bilateral: Secondary | ICD-10-CM

## 2022-01-22 DIAGNOSIS — I6381 Other cerebral infarction due to occlusion or stenosis of small artery: Secondary | ICD-10-CM | POA: Diagnosis not present

## 2022-01-22 DIAGNOSIS — I6782 Cerebral ischemia: Secondary | ICD-10-CM | POA: Diagnosis not present

## 2022-01-22 MED ORDER — GADOBENATE DIMEGLUMINE 529 MG/ML IV SOLN
16.0000 mL | Freq: Once | INTRAVENOUS | Status: AC | PRN
  Administered 2022-01-22: 16 mL via INTRAVENOUS

## 2022-01-31 DIAGNOSIS — I10 Essential (primary) hypertension: Secondary | ICD-10-CM | POA: Insufficient documentation

## 2022-02-14 ENCOUNTER — Encounter: Payer: Self-pay | Admitting: Podiatry

## 2022-02-14 ENCOUNTER — Ambulatory Visit: Payer: Medicare HMO | Admitting: Podiatry

## 2022-02-14 DIAGNOSIS — B351 Tinea unguium: Secondary | ICD-10-CM

## 2022-02-14 DIAGNOSIS — M79674 Pain in right toe(s): Secondary | ICD-10-CM

## 2022-02-14 DIAGNOSIS — M79675 Pain in left toe(s): Secondary | ICD-10-CM | POA: Diagnosis not present

## 2022-02-16 ENCOUNTER — Other Ambulatory Visit: Payer: Self-pay | Admitting: Internal Medicine

## 2022-02-16 DIAGNOSIS — Z1231 Encounter for screening mammogram for malignant neoplasm of breast: Secondary | ICD-10-CM

## 2022-02-19 NOTE — Progress Notes (Signed)
?  Subjective:  ?Patient ID: Debra Woodward, female    DOB: 01-09-1951,  MRN: [id number] ? ?Leocadia Idleman presents to clinic today for painful thick toenails that are difficult to trim. Pain interferes with ambulation. Aggravating factors include wearing enclosed shoe gear. Pain is relieved with periodic professional debridement. ? ?She continues to use the compounded topical antifungal toenail solution from [place]. She relates she did experience an episode of pain when pressing on the great toenails. ? ?PCP is Panosh, [name], MD , and last visit was December 29, 2021. ? ?Allergies  ?Allergen Reactions  ? Bee Venom Hives  ? Prednisone Other (See Comments)  ?  hallucinations  ? ? ?Review of Systems: Negative except as noted in the HPI. ? ?Objective: No changes noted in today's physical examination. ? ?Neurovascular Examination: ?CFT <3 seconds b/l LE. Palpable DP/PT pulses b/l LE. Digital hair sparse b/l. Skin temperature gradient WNL b/l. No pain with calf compression b/l. No edema noted b/l. No cyanosis or clubbing noted b/l LE. ? ?Proprioception intact bilaterally. ? ?Dermatological:  ?Pedal integument with normal turgor, texture and tone b/l LE. No open wounds b/l. No interdigital macerations b/l. Toenails 1-5 b/l elongated, thickened, discolored with subungual debris. Distal 1/2 of bilateral great toes with resolution of +Tenderness with dorsal palpation of nailplates. No hyperkeratotic or porokeratotic lesions present. ? ?Musculoskeletal:  ?Muscle strength 5/5 to all lower extremity muscle groups bilaterally. Limited joint ROM to the right ankle. Patient ambulates independent of any assistive aids. ? ? ?  Latest Ref Rng & Units 12/29/2021  ? 12:29 PM 09/22/2021  ? 12:01 PM  ?Hemoglobin A1C  ?Hemoglobin-A1c 4.0 - 5.6 % 6.4   6.7    ? ?Assessment/Plan: ?1. Pain due to onychomycosis of toenails of both feet   ?  ?-Examined patient. ?-Discussed her one episode of tenderness to bilateral great toes. No  acute findings on today's visit. Monitor. ?-Mycotic toenails were debrided in length and girth 1-5 bilaterally with sterile nail nippers and dremel without iatrogenic bleeding. Continue daily use of topical compounded antifungal medication from [place] as instructed. ?-Patient/POA to call should there be question/concern in the interim.  ? ?Return in about 3 months (around 05/16/2022). ? ?Marzetta Board, DPM  ?

## 2022-02-28 ENCOUNTER — Ambulatory Visit
Admission: RE | Admit: 2022-02-28 | Discharge: 2022-02-28 | Disposition: A | Discharge status: Home / Self Care | Payer: Medicare HMO | Source: Ambulatory Visit | Attending: Internal Medicine | Admitting: Internal Medicine

## 2022-02-28 DIAGNOSIS — Z1231 Encounter for screening mammogram for malignant neoplasm of breast: Secondary | ICD-10-CM

## 2022-03-01 ENCOUNTER — Other Ambulatory Visit: Payer: Self-pay | Admitting: Internal Medicine

## 2022-03-01 DIAGNOSIS — R928 Other abnormal and inconclusive findings on diagnostic imaging of breast: Secondary | ICD-10-CM

## 2022-03-06 NOTE — Progress Notes (Signed)
Make sure patient knows result and has a fu   as advised by radiologist

## 2022-03-17 ENCOUNTER — Ambulatory Visit
Admission: RE | Admit: 2022-03-17 | Discharge: 2022-03-17 | Disposition: A | Discharge status: Home / Self Care | Payer: Medicare HMO | Source: Ambulatory Visit | Attending: Internal Medicine | Admitting: Internal Medicine

## 2022-03-17 ENCOUNTER — Ambulatory Visit: Payer: Medicare HMO

## 2022-03-17 ENCOUNTER — Other Ambulatory Visit: Payer: Medicare HMO

## 2022-03-17 DIAGNOSIS — N6489 Other specified disorders of breast: Secondary | ICD-10-CM | POA: Diagnosis not present

## 2022-03-17 DIAGNOSIS — R928 Other abnormal and inconclusive findings on diagnostic imaging of breast: Secondary | ICD-10-CM

## 2022-03-22 NOTE — Progress Notes (Signed)
? ?  Kayleanna Lorman Jan 16, 1951 [id number] ? ? ?History:  71 y.o. [id number] presents for breast and pelvic exam without GYN complaints. Postmenopausal, no HRT. S/P 1996 TAH for fibroids. Normal pap and mammogram history. Has been caring for sister who has colon cancer.  ? ?Gynecologic History ?No LMP recorded. Patient has had a hysterectomy. ?  ?Contraception: status post hysterectomy ?Sexually active: Yes ? ?Health Maintenance ?Last Pap: 2010. Results were: Normal ?Last mammogram: 02/28/2022 Results were: Normal after follow up imaging ?Last colonoscopy: 10/15/2018. Results were: Normal, 5-year recall ?Last Dexa: 04/14/2020. Results were: Normal ? ?Past medical history, past surgical history, family history and social history were all reviewed and documented in the EPIC chart. Long-term partner. Grandchildren 13 and 16 live in [place] - she visits a couple of times per year.  ? ?ROS:  A ROS was performed and pertinent positives and negatives are included. ? ?Exam: ? ?Vitals:  ? 03/23/22 0813  ?BP: 132/82  ?Pulse: 75  ?SpO2: 94%  ?Weight: 191 lb (86.6 kg)  ?Height: 5' 4.75" (1.645 m)  ? ? ?Body mass index is 32.03 kg/m?. ? ?General appearance:  Normal ?Thyroid:  Symmetrical, normal in size, without palpable masses or nodularity. ?Respiratory ? Auscultation:  Clear without wheezing or rhonchi ?Cardiovascular ? Auscultation:  Regular rate, without rubs, murmurs or gallops ? Edema/varicosities:  Not grossly evident ?Abdominal ? Soft,nontender, without masses, guarding or rebound. ? Liver/spleen:  No organomegaly noted ? Hernia:  None appreciated ? Skin ? Inspection:  Grossly normal ?Breasts: Examined lying and sitting.  ? Right: Without masses, retractions, nipple discharge or axillary adenopathy. ? ? Left: Without masses, retractions, nipple discharge or axillary adenopathy. ?Gentitourinary  ? Inguinal/mons:  Normal without inguinal adenopathy ? External genitalia:  Normal appearing vulva with no masses, tenderness, or  lesions ? BUS/Urethra/Skene's glands:  Normal ? Vagina:  Normal appearing with normal color and discharge, no lesions. Atrophic changes ? Cervix:  Absent ? Uterus:  Absent ? Adnexa/parametria:   ?  Rt: Normal in size, without masses or tenderness. ?  Lt: Normal in size, without masses or tenderness. ? Anus and perineum: Normal ? Digital rectal exam: Normal sphincter tone without palpated masses or tenderness ? ?Patient informed chaperone available to be present for breast and pelvic exam. Patient has requested no chaperone to be present. Patient has been advised what will be completed during breast and pelvic exam.  ? ?Assessment/Plan:  71 y.o. [id number] for breast and pelvic exam.  ? ?Well female exam with routine gynecological exam - Education provided on SBEs, importance of preventative screenings, current guidelines, high calcium diet, regular exercise, and multivitamin daily. Labs with PCP.  ? ?Postmenopausal - no HRT  ? ?Screening for cervical cancer - Normal Pap history.  No longer screening per guidelines.  ? ?Screening for breast cancer - Normal mammogram history.  Continue annual screenings.  Normal breast exam today. ? ?Screening for colon cancer - 2019 colonoscopy. Will repeat at 5-year interval per GI's recommendation.  ? ?Screening for osteoporosis - Normal DXA 04/2020. Will repeat at 5-year interval.  ? ?Return in 2 years for breast and pelvic exam. ? ? ?[name] DNP, 8:44 AM 03/23/2022 ? ?

## 2022-03-23 ENCOUNTER — Encounter: Payer: Self-pay | Admitting: Nurse Practitioner

## 2022-03-23 ENCOUNTER — Ambulatory Visit (INDEPENDENT_AMBULATORY_CARE_PROVIDER_SITE_OTHER): Payer: Medicare HMO | Admitting: Nurse Practitioner

## 2022-03-23 VITALS — BP 132/82 | HR 75 | Ht 64.75 in | Wt 191.0 lb

## 2022-03-23 DIAGNOSIS — Z780 Asymptomatic menopausal state: Secondary | ICD-10-CM

## 2022-03-23 DIAGNOSIS — Z01419 Encounter for gynecological examination (general) (routine) without abnormal findings: Secondary | ICD-10-CM

## 2022-03-28 ENCOUNTER — Ambulatory Visit: Payer: Medicare HMO | Admitting: Internal Medicine

## 2022-04-03 NOTE — Progress Notes (Signed)
Chief Complaint  Patient presents with   Follow-up    HPI: Debra Woodward 71 y.o. come in for Chronic disease management  Last visit 2 15   plan to  increase  amlodipine from 2.5 - 5 mg . NO se but  missed a dose but now taking reg at night  Lots of stress  Trans porting sister to pT and infusion. 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 Herself Breakfast ok .  Has reduced snacking  at night  working on the weight gain. Tnoted at her gyne visit  Notes readings are better  6-8 hours after taking  med Allergic to bees hx of hives and sob . Has old epi pen  asks for updated rx  Still using tobacco   ROS: See pertinent positives and negatives per HPI.  Past Medical History:  Diagnosis Date   ALLERGIC RHINITIS 09/14/2006   Allergy    Fibroid    Hx of adenomatous colonic polyps 07/28/2015   Seasonal allergies     Family History  Problem Relation Age of Onset   Stroke Mother    Diabetes Mother    Diabetes Father    Hypertension Sister    Esophageal cancer Paternal Grandfather 90   Colon cancer Neg Hx    Stomach cancer Neg Hx    Rectal cancer Neg Hx    Breast cancer Neg Hx    Colon polyps Neg Hx     Social History   Socioeconomic History   Marital status: Single    Spouse name: Not on file   Number of children: Not on file   Years of education: Not on file   Highest education level: Bachelor's degree (e.g., BA, AB, BS)  Occupational History   Not on file  Tobacco Use   Smoking status: Every Day    Packs/day: 0.25    Types: Cigarettes   Smokeless tobacco: Never  Vaping Use   Vaping Use: Never used  Substance and Sexual Activity   Alcohol use: Yes    Alcohol/week: 2.0 standard drinks    Types: 2 Glasses of wine per week    Comment: social   Drug use: No   Sexual activity: Yes    Birth control/protection: Surgical    Comment: HYST  Other Topics Concern   Not on file  Social History Narrative   Going to [facility]   No pets       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    Social Determinants of Health   Financial Resource Strain: Low Risk    Difficulty of Paying Living Expenses: Not very hard  Food Insecurity: No Food Insecurity   Worried About Charity fundraiser in the Last Year: Never true   Ran Out of Food in the Last Year: Never true  Transportation Needs: No Transportation Needs   Lack of Transportation (Medical): No   Lack of Transportation (Non-Medical): No  Physical Activity: Sufficiently Active   Days of Exercise per Week: 3 days   Minutes of Exercise per Session: 60 min  Stress: No Stress Concern Present   Feeling of Stress : Only a little  Social Connections: Moderately Integrated   Frequency of Communication with Friends and Family: More than three times a week   Frequency of Social Gatherings with Friends and Family: Once a week   Attends Religious Services: More  than 4 times per year   Active Member of Clubs or Organizations: Patient refused   Attends Archivist Meetings: More than 4 times per year   Marital Status: Divorced    Outpatient Medications Prior to Visit  Medication Sig Dispense Refill   amLODipine (NORVASC) 5 MG tablet Take 1 tablet (5 mg total) by mouth daily. 90 tablet 2   Ascorbic Acid (VITAMIN C) 500 MG CAPS Take 1 capsule by mouth daily.     aspirin 81 MG EC tablet 1 by mouth daily or 3 d per week. 30 tablet 12   cetirizine (ZYRTEC) 10 MG tablet Take 10 mg by mouth daily. Patient alternates with Allegra     Cholecalciferol (VITAMIN D3) 100000 UNIT/GM POWD Take by mouth.     desonide (DESOWEN) 0.05 % cream desonide 0.05 % topical cream  APPLY CREAM TOPICALLY TWICE DAILY TO ITCHY AREA ON FACE NOT IN THE EYE     docusate sodium (COLACE) 100 MG capsule Take 1 capsule (100 mg total) by mouth every 12 (twelve) hours. 30 capsule 0   fexofenadine (ALLEGRA) 60 MG tablet Take 60 mg by mouth daily. Patient alternates with Zyrtec     fluticasone  (FLONASE) 50 MCG/ACT nasal spray USE 2 SPRAY(S) IN EACH NOSTRIL ONCE DAILY AT  NIGHT 16 g 6   Glycerin-Polysorbate 80 (REFRESH DRY EYE THERAPY OP) Apply to eye.     Multiple Vitamin (MULTIVITAMIN) tablet Take 1 tablet by mouth daily.     NONFORMULARY OR COMPOUNDED ITEM Apply 1-2 g topically daily. [facility]: Nail laquer 120 each 3   ondansetron (ZOFRAN) 4 MG tablet      triamcinolone ointment (KENALOG) 0.1 % Apply small amt twice per day x 14 days then twice weekly 30 g 1   VITAMIN D PO Take 5,000 Int'l Units by mouth every other day.     No facility-administered medications prior to visit.     EXAM:  BP 124/72 (BP Location: Left Arm, Patient Position: Sitting, Cuff Size: Normal)   Pulse 80   Temp 98.9 F (37.2 C) (Oral)   Ht 5' 4.75" (1.645 m)   Wt 188 lb 9.6 oz (85.5 kg)   SpO2 97%   BMI 31.63 kg/m   Body mass index is 31.63 kg/m. Wt Readings from Last 3 Encounters:  04/04/22 188 lb 9.6 oz (85.5 kg)  03/23/22 191 lb (86.6 kg)  12/29/21 183 lb 9.6 oz (83.3 kg)  Bp readings per patient 134/85 162/92 aftr missed lat dose , 146/88 138/88  154/93   GENERAL: vitals reviewed and listed above, alert, oriented, appears well hydrated and in no acute distress HEENT: atraumatic, conjunctiva  clear, no obvious abnormalities on inspection of external nose and ears NECK: no obvious masses on inspection palpation  CV: HRRR, no clubbing cyanosis or  peripheral edema nl cap refill  MS: moves all extremities without noticeable focal  abnormality PSYCH: pleasant and cooperative, no obvious depression or anxiety Lab Results  Component Value Date   WBC 6.2 09/22/2021   HGB 14.0 09/22/2021   HCT 41.5 09/22/2021   PLT 398.0 09/22/2021   GLUCOSE 92 09/22/2021   CHOL 169 09/22/2021   TRIG 70.0 09/22/2021   HDL 75.90 09/22/2021   LDLCALC 79 09/22/2021   ALT 15 09/22/2021   AST 20 09/22/2021   NA 141 09/22/2021   K 4.0 09/22/2021   CL 104 09/22/2021   CREATININE 0.76 09/22/2021    BUN 13 09/22/2021   CO2 28 09/22/2021  TSH 2.49 09/22/2021   INR 0.94 09/07/2010   HGBA1C 6.4 (A) 12/29/2021   BP Readings from Last 3 Encounters:  04/04/22 124/72  03/23/22 132/82  12/29/21 (!) 150/76    ASSESSMENT AND PLAN:  Discussed the following assessment and plan:  Essential hypertension - continue amlodipine  5 mg and lsi   monitor bp and fu cpx in fall   Medication management  Tobacco use disorder  Stress due to illness of family member  Allergy to bee sting - refill epipen updated other expired   -Patient advised to return or notify health care team  if  new concerns arise.  Patient Instructions  Continue Continue lifestyle intervention healthy eating and activity. Bp acceptable today . Continue medication   Cpx in fall .  Labs at that time. Stop tobacco.    [name]. Greogory Cornette M.D.

## 2022-04-04 ENCOUNTER — Ambulatory Visit (INDEPENDENT_AMBULATORY_CARE_PROVIDER_SITE_OTHER): Payer: Medicare HMO | Admitting: Internal Medicine

## 2022-04-04 ENCOUNTER — Encounter: Payer: Self-pay | Admitting: Internal Medicine

## 2022-04-04 VITALS — BP 124/72 | HR 80 | Temp 98.9°F | Ht 64.75 in | Wt 188.6 lb

## 2022-04-04 DIAGNOSIS — Z6379 Other stressful life events affecting family and household: Secondary | ICD-10-CM

## 2022-04-04 DIAGNOSIS — I10 Essential (primary) hypertension: Secondary | ICD-10-CM | POA: Diagnosis not present

## 2022-04-04 DIAGNOSIS — Z79899 Other long term (current) drug therapy: Secondary | ICD-10-CM

## 2022-04-04 DIAGNOSIS — Z91030 Bee allergy status: Secondary | ICD-10-CM

## 2022-04-04 DIAGNOSIS — F17200 Nicotine dependence, unspecified, uncomplicated: Secondary | ICD-10-CM

## 2022-04-04 MED ORDER — EPINEPHRINE 0.3 MG/0.3ML IJ SOAJ: 0.3000 mg | INTRAMUSCULAR | 1 refills | Status: AC | PRN

## 2022-04-04 NOTE — Patient Instructions (Addendum)
Continue Continue lifestyle intervention healthy eating and activity. Bp acceptable today . Continue medication   Cpx in fall .  Labs at that time. Stop tobacco.

## 2022-04-14 DIAGNOSIS — H6122 Impacted cerumen, left ear: Secondary | ICD-10-CM | POA: Insufficient documentation

## 2022-04-14 HISTORY — DX: Impacted cerumen, left ear: H61.22

## 2022-04-19 DIAGNOSIS — H40033 Anatomical narrow angle, bilateral: Secondary | ICD-10-CM | POA: Diagnosis not present

## 2022-04-19 DIAGNOSIS — H40023 Open angle with borderline findings, high risk, bilateral: Secondary | ICD-10-CM | POA: Diagnosis not present

## 2022-04-19 DIAGNOSIS — H04123 Dry eye syndrome of bilateral lacrimal glands: Secondary | ICD-10-CM | POA: Diagnosis not present

## 2022-05-23 ENCOUNTER — Ambulatory Visit: Payer: Medicare HMO | Admitting: Podiatry

## 2022-05-24 ENCOUNTER — Telehealth: Payer: Medicare HMO

## 2022-06-02 ENCOUNTER — Telehealth: Payer: Self-pay | Admitting: Pharmacist

## 2022-06-02 NOTE — Chronic Care Management (AMB) (Signed)
    Chronic Care Management Pharmacy Assistant   Name: Debra Woodward  MRN: [id number] DOB: 07/02/1951  06/02/22 APPOINTMENT REMINDER   Patient is aware and has checked in for telephone visit on 06/03/22 at 9:30, with Jeni Salles, Pharm. D, aware to have all medications, supplements and any blood glucose and blood pressure readings available for review   Care Gaps: BP- 124/72 04/04/22 AWV- 06/30/21  Star Rating Drug: None     Medications: Outpatient Encounter Medications as of 06/02/2022  Medication Sig   amLODipine (NORVASC) 5 MG tablet Take 1 tablet (5 mg total) by mouth daily.   Ascorbic Acid (VITAMIN C) 500 MG CAPS Take 1 capsule by mouth daily.   aspirin 81 MG EC tablet 1 by mouth daily or 3 d per week.   cetirizine (ZYRTEC) 10 MG tablet Take 10 mg by mouth daily. Patient alternates with Allegra   Cholecalciferol (VITAMIN D3) 100000 UNIT/GM POWD Take by mouth.   desonide (DESOWEN) 0.05 % cream desonide 0.05 % topical cream  APPLY CREAM TOPICALLY TWICE DAILY TO ITCHY AREA ON FACE NOT IN THE EYE   docusate sodium (COLACE) 100 MG capsule Take 1 capsule (100 mg total) by mouth every 12 (twelve) hours.   EPINEPHrine (EPIPEN 2-PAK) 0.3 mg/0.3 mL IJ SOAJ injection Inject 0.3 mg into the muscle as needed for anaphylaxis.   fexofenadine (ALLEGRA) 60 MG tablet Take 60 mg by mouth daily. Patient alternates with Zyrtec   fluticasone (FLONASE) 50 MCG/ACT nasal spray USE 2 SPRAY(S) IN EACH NOSTRIL ONCE DAILY AT  NIGHT   Glycerin-Polysorbate 80 (REFRESH DRY EYE THERAPY OP) Apply to eye.   Multiple Vitamin (MULTIVITAMIN) tablet Take 1 tablet by mouth daily.   NONFORMULARY OR COMPOUNDED ITEM Apply 1-2 g topically daily. [facility]: Nail laquer   ondansetron (ZOFRAN) 4 MG tablet    triamcinolone ointment (KENALOG) 0.1 % Apply small amt twice per day x 14 days then twice weekly   VITAMIN D PO Take 5,000 Int'l Units by mouth every other day.   No facility-administered encounter  medications on file as of 06/02/2022.      [place] Clinical Pharmacist Assistant [phone]

## 2022-06-02 NOTE — Progress Notes (Signed)
Chronic Care Management Pharmacy Note  06/03/2022 Name:  Debra Woodward MRN:  [id number] DOB:  12/15/1950  Summary: Patient continues to smoke about 4-5 cigs/day BP has not been at goal < 130/80 per home readings  Recommendations/Changes made from today's visit: -Recommended increasing amlodipine dose and sent recommendation to PCP -Educated on proper BP monitoring technique  Plan: BP assessment in 1 month Follow up in 5 months  Subjective: Debra Woodward is an 71 y.o. year old female who is a primary patient of Panosh, [name], MD.  The CCM team was consulted for assistance with disease management and care coordination needs.    Engaged with patient by telephone for follow up visit in response to provider referral for pharmacy case management and/or care coordination services.   Consent to Services:  The patient was given information about Chronic Care Management services, agreed to services, and gave verbal consent prior to initiation of services.  Please see initial visit note for detailed documentation.   Patient Care Team: Panosh, [name], MD as PCP - General (Internal Medicine) Huel Cote, NP (Inactive) as Nurse Practitioner (Obstetrics and Gynecology) Viona Gilmore, [facility] as Pharmacist (Pharmacist)  Recent office visits: 04/04/22 Shanon Ace, MD: Patient presented for chronic conditions follow up. No medication changes. Plan for physical in fall.  12/29/21 Shanon Ace, MD: Patient presented for chronic conditions follow up. Increased almodipine to 5 mg day. Follow up in 3 months.  Recent consult visits: 04/19/22 Arnoldo Hooker (ophthalmology): Patient presented for eye exam. Unable to access notes.  04/14/22 Jolene Provost, PA-C (ENT): Patient presented for cerumen impaction and removal. Follow up in 4 months.  03/23/22 Marny Lowenstein, NP (OBGYN): Patient presented for breast and pelvic exam. Follow up in 2 years.  03/13/22 Acquanetta Sit, DPM (podiatry): Patient  presented for nail trim.  12/17/21 Jolene Provost, PA-C (ENT): Patient presented for initial consult for ear cleaning and hearing loss.  Hospital visits: None in previous 6 months  Objective:  Lab Results  Component Value Date   CREATININE 0.76 09/22/2021   BUN 13 09/22/2021   GFR 79.64 09/22/2021   GFRNONAA 83 09/23/2020   GFRAA 96 09/23/2020   NA 141 09/22/2021   K 4.0 09/22/2021   CALCIUM 10.1 09/22/2021   CO2 28 09/22/2021   GLUCOSE 92 09/22/2021    Lab Results  Component Value Date/Time   HGBA1C 6.4 (A) 12/29/2021 12:29 PM   HGBA1C 6.7 (H) 09/22/2021 12:01 PM   HGBA1C 6.2 (H) 09/23/2020 12:06 PM   GFR 79.64 09/22/2021 12:01 PM   GFR 105.91 09/23/2019 04:28 PM    Last diabetic Eye exam: No results found for: "HMDIABEYEEXA"  Last diabetic Foot exam: No results found for: "HMDIABFOOTEX"   Lab Results  Component Value Date   CHOL 169 09/22/2021   HDL 75.90 09/22/2021   LDLCALC 79 09/22/2021   TRIG 70.0 09/22/2021   CHOLHDL 2 09/22/2021       Latest Ref Rng & Units 09/22/2021   12:01 PM 09/23/2020   12:06 PM 06/26/2019    1:48 PM  Hepatic Function  Total Protein 6.0 - 8.3 g/dL 7.8  7.0  7.7   Albumin 3.5 - 5.2 g/dL 4.8   4.0   AST 0 - 37 U/L '20  16  27   ' ALT 0 - 35 U/L 15  13  44   Alk Phosphatase 39 - 117 U/L 92   73   Total Bilirubin 0.2 - 1.2 mg/dL 0.4  0.6  0.4   Bilirubin, Direct 0.0 - 0.3 mg/dL 0.1  0.1      Lab Results  Component Value Date/Time   TSH 2.49 09/22/2021 12:01 PM   TSH 2.41 09/23/2019 04:28 PM   FREET4 0.81 09/22/2021 12:01 PM       Latest Ref Rng & Units 09/22/2021   12:01 PM 09/23/2020   12:06 PM 09/23/2019    4:28 PM  CBC  WBC 4.0 - 10.5 K/uL 6.2  5.6  6.5   Hemoglobin 12.0 - 15.0 g/dL 14.0  14.3  12.9   Hematocrit 36.0 - 46.0 % 41.5  41.1  39.0   Platelets 150.0 - 400.0 K/uL 398.0  408  371.0     Lab Results  Component Value Date/Time   VD25OH 22.44 (L) 03/11/2016 08:58 AM   VD25OH 23 (L) 03/10/2015 04:27 PM     Clinical ASCVD: No  The 10-year ASCVD risk score (Arnett DK, et al., 2019) is: 21.2%   Values used to calculate the score:     Age: 70 years     Sex: Female     Is Non-Hispanic African American: Yes     Diabetic: No     Tobacco smoker: Yes     Systolic Blood Pressure: 810 mmHg     Is BP treated: Yes     HDL Cholesterol: 75.9 mg/dL     Total Cholesterol: 169 mg/dL       04/04/2022    3:40 PM 12/29/2021   11:32 AM 06/30/2021    3:28 PM  Depression screen PHQ 2/9  Decreased Interest 0 0 0  Down, Depressed, Hopeless 0 0 0  PHQ - 2 Score 0 0 0  Altered sleeping 1 1   Tired, decreased energy 1 1   Change in appetite 1 1   Feeling bad or failure about yourself  1 1   Trouble concentrating 1 1   Moving slowly or fidgety/restless 0 1   Suicidal thoughts 0 0   PHQ-9 Score 5 6   Difficult doing work/chores Not difficult at all        Social History   Tobacco Use  Smoking Status Every Day   Packs/day: 0.25   Types: Cigarettes  Smokeless Tobacco Never   BP Readings from Last 3 Encounters:  04/04/22 124/72  03/23/22 132/82  12/29/21 (!) 150/76   Pulse Readings from Last 3 Encounters:  04/04/22 80  03/23/22 75  12/29/21 81   Wt Readings from Last 3 Encounters:  04/04/22 188 lb 9.6 oz (85.5 kg)  03/23/22 191 lb (86.6 kg)  12/29/21 183 lb 9.6 oz (83.3 kg)   BMI Readings from Last 3 Encounters:  04/04/22 31.63 kg/m  03/23/22 32.03 kg/m  12/29/21 30.55 kg/m    Assessment/Interventions: Review of patient past medical history, allergies, medications, health status, including review of consultants reports, laboratory and other test data, was performed as part of comprehensive evaluation and provision of chronic care management services.   SDOH:  (Social Determinants of Health) assessments and interventions performed: Yes SDOH Interventions    Flowsheet Row Most Recent Value  SDOH Interventions   Financial Strain Interventions Intervention Not Indicated        CCM Care Plan  Allergies  Allergen Reactions   Bee Venom Hives   Prednisone Other (See Comments)    hallucinations    Medications Reviewed Today     Reviewed by Burnis Medin, MD (Physician) on 04/04/22 at 1650  Med List Status: <None>  Medication Order Taking? Sig Documenting Provider Last Dose Status Informant  amLODipine (NORVASC) 5 MG tablet [id number] Yes Take 1 tablet (5 mg total) by mouth daily. Panosh, [name], MD Taking Active   Ascorbic Acid (VITAMIN C) 500 MG CAPS [id number] Yes Take 1 capsule by mouth daily. [provider] Taking Active   aspirin 81 MG EC tablet [id number] Yes 1 by mouth daily or 3 d per week. Panosh, [name], MD Taking Active   cetirizine (ZYRTEC) 10 MG tablet [id number] Yes Take 10 mg by mouth daily. Patient alternates with Allegra [provider] Taking Active   Cholecalciferol (VITAMIN D3) 100000 UNIT/GM POWD [id number] Yes Take by mouth. [provider] Taking Active   desonide (DESOWEN) 0.05 % cream [id number] Yes desonide 0.05 % topical cream  APPLY CREAM TOPICALLY TWICE DAILY TO ITCHY AREA ON FACE NOT IN THE EYE [provider] Taking Active   docusate sodium (COLACE) 100 MG capsule [id number] Yes Take 1 capsule (100 mg total) by mouth every 12 (twelve) hours. Khatri, Hina, PA-C Taking Active   EPINEPHrine (EPIPEN 2-PAK) 0.3 mg/0.3 mL IJ SOAJ injection [id number] Yes Inject 0.3 mg into the muscle as needed for anaphylaxis. Panosh, [name], MD  Active   fexofenadine (ALLEGRA) 60 MG tablet [id number] Yes Take 60 mg by mouth daily. Patient alternates with Zyrtec [provider] Taking Active Self  fluticasone (FLONASE) 50 MCG/ACT nasal spray [id number] Yes USE 2 SPRAY(S) IN EACH NOSTRIL ONCE DAILY AT  Elie Confer, MD Taking Active   Glycerin-Polysorbate 80 [facility] DRY EYE THERAPY OP) [id number] Yes Apply to eye. [provider] Taking Active   Multiple Vitamin (MULTIVITAMIN) tablet  [id number] Yes Take 1 tablet by mouth daily. [provider] Taking Active Self  NONFORMULARY OR COMPOUNDED ITEM [id number] Yes Apply 1-2 g topically daily. [facility]: Nail laquer Marzetta Board, DPM Taking Active Self  ondansetron (ZOFRAN) 4 MG tablet [id number] Yes  [provider] Taking Active   triamcinolone ointment (KENALOG) 0.1 % [id number] Yes Apply small amt twice per day x 14 days then twice weekly Karma Ganja, NP Taking Active   VITAMIN D PO [id number] Yes Take 5,000 Int'l Units by mouth every other day. [provider] Taking Active             Patient Active Problem List   Diagnosis Date Noted   Essential hypertension 01/31/2022   Acquired stenosis of both external ear canals 12/17/2021   Excessive cerumen in both ear canals 12/17/2021   Sensorineural hearing loss (SNHL), bilateral 12/17/2021   Arthralgia of left ankle 04/20/2021   Arthritis of both knees 11/15/2017   Aftercare 11/15/2017   Hx of adenomatous colonic polyps 07/28/2015   Degenerative disc disease, lumbar sacral 11/13/2012   Elevated blood pressure reading 11/13/2012   Allergic rhinitis 09/14/2006    Immunization History  Administered Date(s) Administered   Fluad Quad(high Dose 65+) 09/14/2021   Hep A / Hep B 01/25/2018, 02/27/2018, 08/10/2018   Influenza Split 08/07/2012, 08/23/2014   Influenza Whole 08/14/2001, 09/10/2007, 08/16/2010, 08/15/2011   Influenza, High Dose Seasonal PF 08/23/2017, 08/10/2018, 09/02/2019, 10/01/2020   Influenza,inj,Quad PF,6+ Mos 08/16/2016   Influenza-Unspecified 08/14/2013, 08/14/2017   PFIZER Comirnaty(Gray Top)Covid-19 Tri-Sucrose Vaccine 04/23/2021   PFIZER(Purple Top)SARS-COV-2 Vaccination 12/03/2019, 12/25/2019, 08/21/2020   Pfizer Covid-19 Vaccine Bivalent Booster 4yr & up 10/13/2021   Pneumococcal Conjugate-13 09/30/2016   Pneumococcal Polysaccharide-23 11/15/2011, 09/18/2018   Pneumococcal-Unspecified 07/15/2016   Td  03/13/2009   Tdap 01/25/2018  Zoster Recombinat (Shingrix) 03/22/2017, 12/11/2017   Zoster, Live 05/28/2012   Patient reports her sister is not doing well. They have stopped treatments for her cancer and are just monitoring her. She was in the hospital for a little while and she wasn't smoking as much because she was with her but is still smoking about 4-5 cigs/day. She is not ready to quit based on current stressors.  Patient does admit she talks to her brother and nephews and has a few close girlfriends that she can rely on during this time. They are all available when she needs to talk to someone.  Patient denies any changes with activity level or diet that may have caused the increase in BP readings. She has been checking first thing in the morning prior to coffee and cigarettes and is taking her amlodipine at night.  Conditions to be addressed/monitored:  Osteoarthritis, Tobacco use, Allergic Rhinitis and elevated blood pressure and pre-diabetes  Conditions addressed this visit: Tobacco use, hypertension  Care Plan : CCM Pharmacy Care Plan  Updates made by Viona Gilmore, [place] since 06/03/2022 12:00 AM     Problem: Problem: Hypertension, tobacco use, allergic rhinitis, arthritis, prediabetes      Long-Range Goal: Patient-Specific Goal   Start Date: 02/02/2021  Expected End Date: 02/02/2022  Recent Progress: On track  Priority: High  Note:   Current Barriers:  Unable to independently monitor therapeutic efficacy Unable to maintain control of blood pressure  Pharmacist Clinical Goal(s):  Patient will achieve adherence to monitoring guidelines and medication adherence to achieve therapeutic efficacy maintain control of blood pressure as evidenced by home and office readings  through collaboration with PharmD and provider.    Interventions: 1:1 collaboration with Panosh, [name], MD regarding development and update of comprehensive plan of care as evidenced by provider  attestation and co-signature Inter-disciplinary care team collaboration (see longitudinal plan of care) Comprehensive medication review performed; medication list updated in electronic medical record  Hypertension (BP goal <130/80) -Uncontrolled -Current treatment: Amlodipine 5 mg 1 tablet daily - PM - Appropriate, Query effective, Safe, Accessible -Medications previously tried: none  -Current home readings: 123/84, 129/84, 137/97, 120/82, 116/78, 133/86, 108/93, 116/76; checking daily at different times (sister's arm cuff)  -Current dietary habits: does not cook with salt and doesn't eat red meat; eats mostly seafood, chicken or [place]; eats lots of vegetables and some fruit; not eating out more  -Current exercise habits: walking some; limited with taking care of her sister -Denies hypotensive/hypertensive symptoms -Educated on Daily salt intake goal < 2300 mg; Exercise goal of 150 minutes per week; Importance of home blood pressure monitoring; -Counseled to monitor BP at home as directed, document, and provide log at future appointments -Counseled on diet and exercise extensively Recommended increasing amlodipine to 7.5 mg.  Pre-diabetes (A1c goal <6.5%) -Controlled -Current medications: No medications -Medications previously tried: none  -Current home glucose readings fasting glucose: does not check post prandial glucose: does not check -Denies hypoglycemic/hyperglycemic symptoms -Current meal patterns:  breakfast: n/a  lunch: n/a  dinner: n/a snacks: n/a drinks: n/a -Current exercise: n/a -Educated on A1c and blood sugar goals; Exercise goal of 150 minutes per week; Carbohydrate counting and/or plate method -Counseled to check feet daily and get yearly eye exams -Counseled on diet and exercise extensively  Tobacco use (Goal quit smoking) -Uncontrolled -Previous quit attempts: patches (too much nicotine), gum (flavor), quit cold [place] for about 6 months (6-7 years ago)   -Current treatment  No medications -Patient smokes  After 30 minutes of waking -Patient triggers include:  habit and with breakfast -On a scale of 1-10, reports MOTIVATION to quit is 7 -On a scale of 1-10, reports CONFIDENCE in quitting is 7 -Counseled to allow lozenge to dissolve and absorb in cheek pocket, rather than swallow, to reduce GI side effects. Provided contact information for [place] Quit Line (1-800-QUIT-NOW) and encouraged patient to reach out to this group for support. -Counseled on using distractions to stop herself from picking up a cigarette and putting chewing gum on top of her cigarettes in her bag to give an added layer of distraction Recommended setting a goal of 1 less cigarette per day per month to see if she can slowly taper off and setting them aside in a plastic bag for each day. Educated on using the 2 mg dose of nicotine lozenges if she decides to start  Allergic rhinitis (Goal: minimize symptoms) -Controlled -Current treatment  fexofenadine (Allegra) 11m, 1 tablet once daily  - Appropriate, Effective, Safe, Accessible cetirizine (Zyrtec) 138m 1 tablet once daily (alternates antihistamines- does not take together)  - Appropriate, Effective, Safe, Accessible fluticasone (Flonase) 5075m act nasal spray, 2 sprays into both nostrils every night - Appropriate, Effective, Safe, Accessible -Medications previously tried: none  -Recommended to continue current medication Patient has been going to ENT every 3-4 months.  Arthritis (Goal: minimize pain) -Controlled -Current treatment  diclofenac 1% gel, apply 2 grams four times daily (uses as needed) - Appropriate, Effective, Safe, Accessible -Medications previously tried: none  -Recommended to continue current medication  Health Maintenance -Vaccine gaps: none -Current therapy:  Docusate 100 mg 1 capsule every 12 hours Refresh dry eye as needed Multivitamin 1 tablet daily Vitamin C 500 mg 1 capsule daily Antifungal  solution apply as needed to toenails Triamcinolone 0.1% ointment apply twice weekly Vitamin D 5000 every other day -Educated on Cost vs benefit of each product must be carefully weighed by individual consumer -Patient is satisfied with current therapy and denies issues -Recommended to continue current medication  Patient Goals/Self-Care Activities Patient will:  - take medications as prescribed check blood pressure as directed, document, and provide at future appointments target a minimum of 150 minutes of moderate intensity exercise weekly  Follow Up Plan: The care management team will reach out to the patient again over the next 30 days.         Medication Assistance: None required.  Patient affirms current coverage meets needs.  Compliance/Adherence/Medication fill history: Care Gaps: BP- 124/72 04/04/22  Star-Rating Drugs: None  [facility]:  [facility] [place]: [phone]: [phone] [address]: [phone]: [phone] pill box? No - puts all of her medicines on her dresser Pt endorses 95% compliance  We discussed: Current pharmacy is preferred with insurance plan and patient is satisfied with pharmacy services Patient decided to: Continue current medication management strategy  Care Plan and Follow Up Patient Decision:  Patient agrees to Care Plan and Follow-up.  Plan: Telephone follow up appointment with care management team member scheduled for:  5 months  MadJeni SallesharmD BCAThomasboroarmacist [place] [phone]

## 2022-06-03 ENCOUNTER — Ambulatory Visit (INDEPENDENT_AMBULATORY_CARE_PROVIDER_SITE_OTHER): Payer: Medicare HMO | Admitting: Pharmacist

## 2022-06-03 DIAGNOSIS — I10 Essential (primary) hypertension: Secondary | ICD-10-CM

## 2022-06-03 DIAGNOSIS — F17200 Nicotine dependence, unspecified, uncomplicated: Secondary | ICD-10-CM

## 2022-06-03 NOTE — Patient Instructions (Signed)
Hi Eniya,  It was great talking to you again! I will let you know what Dr. Regis Bill wants to do with your blood pressure.  Please reach out to me if you have any questions or need anything!  Best, Maddie  Jeni Salles, PharmD, [place] [facility] [place]   Visit Information   Goals Addressed   None    Patient Care Plan: CCM Pharmacy Care Plan     Problem Identified: Problem: Hypertension, tobacco use, allergic rhinitis, arthritis, prediabetes      Long-Range Goal: Patient-Specific Goal   Start Date: 02/02/2021  Expected End Date: 02/02/2022  Recent Progress: On track  Priority: High  Note:   Current Barriers:  Unable to independently monitor therapeutic efficacy Unable to maintain control of blood pressure  Pharmacist Clinical Goal(s):  Patient will achieve adherence to monitoring guidelines and medication adherence to achieve therapeutic efficacy maintain control of blood pressure as evidenced by home and office readings  through collaboration with PharmD and provider.    Interventions: 1:1 collaboration with Panosh, [name], MD regarding development and update of comprehensive plan of care as evidenced by provider attestation and co-signature Inter-disciplinary care team collaboration (see longitudinal plan of care) Comprehensive medication review performed; medication list updated in electronic medical record  Hypertension (BP goal <130/80) -Uncontrolled -Current treatment: Amlodipine 5 mg 1 tablet daily - PM - Appropriate, Query effective, Safe, Accessible -Medications previously tried: none  -Current home readings: 123/84, 129/84, 137/97, 120/82, 116/78, 133/86, 108/93, 116/76; checking daily at different times (sister's arm cuff)  -Current dietary habits: does not cook with salt and doesn't eat red meat; eats mostly seafood, chicken or [place]; eats lots of vegetables and some fruit; not eating out more  -Current  exercise habits: walking some; limited with taking care of her sister -Denies hypotensive/hypertensive symptoms -Educated on Daily salt intake goal < 2300 mg; Exercise goal of 150 minutes per week; Importance of home blood pressure monitoring; -Counseled to monitor BP at home as directed, document, and provide log at future appointments -Counseled on diet and exercise extensively Recommended increasing amlodipine to 7.5 mg.  Pre-diabetes (A1c goal <6.5%) -Controlled -Current medications: No medications -Medications previously tried: none  -Current home glucose readings fasting glucose: does not check post prandial glucose: does not check -Denies hypoglycemic/hyperglycemic symptoms -Current meal patterns:  breakfast: n/a  lunch: n/a  dinner: n/a snacks: n/a drinks: n/a -Current exercise: n/a -Educated on A1c and blood sugar goals; Exercise goal of 150 minutes per week; Carbohydrate counting and/or plate method -Counseled to check feet daily and get yearly eye exams -Counseled on diet and exercise extensively  Tobacco use (Goal quit smoking) -Uncontrolled -Previous quit attempts: patches (too much nicotine), gum (flavor), quit cold [place] for about 6 months (6-7 years ago)  -Current treatment  No medications -Patient smokes After 30 minutes of waking -Patient triggers include:  habit and with breakfast -On a scale of 1-10, reports MOTIVATION to quit is 7 -On a scale of 1-10, reports CONFIDENCE in quitting is 7 -Counseled to allow lozenge to dissolve and absorb in cheek pocket, rather than swallow, to reduce GI side effects. Provided contact information for [place] Quit Line (1-800-QUIT-NOW) and encouraged patient to reach out to this group for support. -Counseled on using distractions to stop herself from picking up a cigarette and putting chewing gum on top of her cigarettes in her bag to give an added layer of distraction Recommended setting a goal of 1 less cigarette per day per  month to see if she can slowly taper off and setting them aside in a plastic bag for each day. Educated on using the 2 mg dose of nicotine lozenges if she decides to start  Allergic rhinitis (Goal: minimize symptoms) -Controlled -Current treatment  fexofenadine (Allegra) '60mg'$ , 1 tablet once daily  - Appropriate, Effective, Safe, Accessible cetirizine (Zyrtec) '10mg'$ , 1 tablet once daily (alternates antihistamines- does not take together)  - Appropriate, Effective, Safe, Accessible fluticasone (Flonase) 9mg/ act nasal spray, 2 sprays into both nostrils every night - Appropriate, Effective, Safe, Accessible -Medications previously tried: none  -Recommended to continue current medication Patient has been going to ENT every 3-4 months.  Arthritis (Goal: minimize pain) -Controlled -Current treatment  diclofenac 1% gel, apply 2 grams four times daily (uses as needed) - Appropriate, Effective, Safe, Accessible -Medications previously tried: none  -Recommended to continue current medication  Health Maintenance -Vaccine gaps: none -Current therapy:  Docusate 100 mg 1 capsule every 12 hours Refresh dry eye as needed Multivitamin 1 tablet daily Vitamin C 500 mg 1 capsule daily Antifungal solution apply as needed to toenails Triamcinolone 0.1% ointment apply twice weekly Vitamin D 5000 every other day -Educated on Cost vs benefit of each product must be carefully weighed by individual consumer -Patient is satisfied with current therapy and denies issues -Recommended to continue current medication  Patient Goals/Self-Care Activities Patient will:  - take medications as prescribed check blood pressure as directed, document, and provide at future appointments target a minimum of 150 minutes of moderate intensity exercise weekly  Follow Up Plan: The care management team will reach out to the patient again over the next 30 days.         Patient verbalizes understanding of instructions and  care plan provided today and agrees to view in [place] status and patient understanding of how to access instructions and care plan via MyChart confirmed with patient.    The pharmacy team will reach out to the patient again over the next 30 days.   MViona Gilmore [facility]St [facility]

## 2022-06-10 ENCOUNTER — Telehealth: Payer: Self-pay | Admitting: Pharmacist

## 2022-06-10 NOTE — Telephone Encounter (Signed)
Called patient to follow up from [facility] telephone call. Patient will take 1.5 tablets (7.5 mg) of amlodipine daily and will continue checking BP readings. Will follow up in 2 weeks for readings and if a new prescription is needed at that time.  Patient verbalized her understanding and updated her medication list with the change.

## 2022-06-10 NOTE — Telephone Encounter (Signed)
-----   Message from Burnis Medin, MD sent at 06/05/2022  8:42 PM EDT ----- Ok to have her try 7.5 mg per day   and let me know if she will proceed with plan  (send message for refills if needed ) WP ----- Message ----- From: Viona Gilmore, [facility] Sent: 06/03/2022   9:53 AM EDT To: Burnis Medin, MD  Hi,  Here are Ms. Meetze recent BP readings: 123/84, 129/84, 137/97, 120/82, 116/78, 133/86, 108/93, 116/76. She said in the past she has seen some > 130 but it seems like her diastolic is what is more consistently on the higher end (80-90s). What do you think about bumping up her amlodipine just to 7.5 mg to see if that helps? I don't think she is at risk of having hypotension at this point and hasn't had any very low readings.  Let me know! Maddie

## 2022-06-13 DIAGNOSIS — I10 Essential (primary) hypertension: Secondary | ICD-10-CM | POA: Diagnosis not present

## 2022-06-13 DIAGNOSIS — F17200 Nicotine dependence, unspecified, uncomplicated: Secondary | ICD-10-CM

## 2022-06-22 ENCOUNTER — Telehealth: Payer: Self-pay | Admitting: Pharmacist

## 2022-06-22 NOTE — Chronic Care Management (AMB) (Signed)
    Chronic Care Management Pharmacy Assistant   Name: Debra Woodward  MRN: [id number] DOB: 06-Dec-1950  Reason for Encounter: Disease State   Conditions to be addressed/monitored: HTN  Recent office visits:  None   Recent consult visits:  None  Hospital visits:  None in previous 6 months  Medications: Outpatient Encounter Medications as of 06/22/2022  Medication Sig   amLODipine (NORVASC) 5 MG tablet Take 1 tablet (5 mg total) by mouth daily. (Patient taking differently: Take 7.5 mg by mouth daily.)   Ascorbic Acid (VITAMIN C) 500 MG CAPS Take 1 capsule by mouth daily.   aspirin 81 MG EC tablet 1 by mouth daily or 3 d per week.   cetirizine (ZYRTEC) 10 MG tablet Take 10 mg by mouth daily. Patient alternates with Allegra   Cholecalciferol (VITAMIN D3) 100000 UNIT/GM POWD Take by mouth.   desonide (DESOWEN) 0.05 % cream desonide 0.05 % topical cream  APPLY CREAM TOPICALLY TWICE DAILY TO ITCHY AREA ON FACE NOT IN THE EYE   docusate sodium (COLACE) 100 MG capsule Take 1 capsule (100 mg total) by mouth every 12 (twelve) hours.   EPINEPHrine (EPIPEN 2-PAK) 0.3 mg/0.3 mL IJ SOAJ injection Inject 0.3 mg into the muscle as needed for anaphylaxis.   fexofenadine (ALLEGRA) 60 MG tablet Take 60 mg by mouth daily. Patient alternates with Zyrtec   fluticasone (FLONASE) 50 MCG/ACT nasal spray USE 2 SPRAY(S) IN EACH NOSTRIL ONCE DAILY AT  NIGHT   Glycerin-Polysorbate 80 (REFRESH DRY EYE THERAPY OP) Apply to eye.   Multiple Vitamin (MULTIVITAMIN) tablet Take 1 tablet by mouth daily.   NONFORMULARY OR COMPOUNDED ITEM Apply 1-2 g topically daily. [facility]: Nail laquer   ondansetron (ZOFRAN) 4 MG tablet    triamcinolone ointment (KENALOG) 0.1 % Apply small amt twice per day x 14 days then twice weekly   VITAMIN D PO Take 5,000 Int'l Units by mouth every other day.   No facility-administered encounter medications on file as of 06/22/2022.   Notes: Call to patient per Debra Woodward to  see how her pressures have been since she last spoke with the pharmacist. Patient reports that despite her sister passing last week her blood pressures have remained under 90 for the diastolic no lower than 80. She was unable to provide specific readings at this time. She further reports she called for a refill on her amlodipine that she will be picking up on today from her pharmacy. Advised her that she would have a new script called in with the 7.5 mg dose that she will not have to cut once she is finished with what she is to pick up on today. She was I agreement  Care Gaps: Flu Vaccine - Overdue Bp - 124/72 04/04/22 AWV- 8/22   [place] Clinical Pharmacist Assistant [phone]

## 2022-06-23 ENCOUNTER — Telehealth: Payer: Self-pay | Admitting: Internal Medicine

## 2022-06-23 NOTE — Telephone Encounter (Signed)
Left message for patient to call back and schedule Medicare Annual Wellness Visit (AWV) either virtually or in office. Left  my Debra Woodward number [phone]   Last AWV ;06/30/21  please schedule at anytime with [facility] Nurse Health Advisor 1 or 2

## 2022-06-29 ENCOUNTER — Encounter: Payer: Self-pay | Admitting: Podiatry

## 2022-06-29 ENCOUNTER — Ambulatory Visit: Payer: Medicare HMO | Admitting: Podiatry

## 2022-06-29 DIAGNOSIS — B351 Tinea unguium: Secondary | ICD-10-CM

## 2022-06-29 DIAGNOSIS — M79675 Pain in left toe(s): Secondary | ICD-10-CM | POA: Diagnosis not present

## 2022-06-29 DIAGNOSIS — M79674 Pain in right toe(s): Secondary | ICD-10-CM | POA: Diagnosis not present

## 2022-07-01 MED ORDER — AMLODIPINE BESYLATE 5 MG PO TABS: ORAL_TABLET | ORAL | 1 refills | Status: DC

## 2022-07-01 NOTE — Addendum Note (Signed)
Addended byEncarnacion Slates on: 07/01/2022 10:31 AM   Modules accepted: Orders

## 2022-07-01 NOTE — Telephone Encounter (Signed)
Please send in  amlodipine 5 mg   , take 7.5 mg per day  disp 90 days worth refill x 1( 6 mos worth)

## 2022-07-04 ENCOUNTER — Ambulatory Visit (INDEPENDENT_AMBULATORY_CARE_PROVIDER_SITE_OTHER): Payer: Medicare HMO

## 2022-07-04 VITALS — BP 122/64 | HR 73 | Temp 99.1°F | Ht 64.75 in | Wt 191.2 lb

## 2022-07-04 DIAGNOSIS — Z0000 Encounter for general adult medical examination without abnormal findings: Secondary | ICD-10-CM

## 2022-07-04 NOTE — Progress Notes (Signed)
  Subjective:  Patient ID: Debra Woodward, female    DOB: November 12, 1951,  MRN: [id number]  Debra Woodward presents to clinic today for painful elongated mycotic toenails 1-5 bilaterally which are tender when wearing enclosed shoe gear. Pain is relieved with periodic professional debridement.  She has been using the compounded topical antifungal solution from [place] for over a year.  She states she lost her sister since her last visit.   New problem(s): None.   PCP is Panosh, [name], MD , and last visit was  Apr 04, 2022  Allergies  Allergen Reactions   Bee Venom Hives   Prednisone Other (See Comments)    hallucinations    Review of Systems: Negative except as noted in the HPI.  Objective: No changes noted in today's physical examination. Debra Woodward is a pleasant 71 y.o. female, WD, WN in NAD. AAO x 3.  Vascular Examination: Vascular status intact b/l with palpable pedal pulses. Pedal hair present b/l. CFT <3 seconds b/l. No edema. No pain with calf compression b/l. Skin temperature gradient WNL b/l.   Neurological Examination: Sensation grossly intact b/l with 10 gram monofilament. Vibratory sensation intact b/l.   Dermatological Examination: Pedal skin with normal turgor, texture and tone b/l. Distal 50% of toenails 1-5 b/l thick, discolored, elongated with subungual debris and pain on dorsal palpation. No hyperkeratotic lesions noted b/l.   Musculoskeletal Examination: Muscle strength 5/5 to b/l LE. Limited joint ROM to the right ankle. Patient ambulates independent of any assistive aids.  Radiographs: None  Last A1c:      Latest Ref Rng & Units 12/29/2021   12:29 PM 09/22/2021   12:01 PM  Hemoglobin A1C  Hemoglobin-A1c 4.0 - 5.6 % 6.4  6.7    Assessment/Plan: 1. Pain due to onychomycosis of toenails of both feet   -Patient was evaluated and treated. All patient's and/or POA's questions/concerns answered on today's visit. -Discontinue topical antifungal  therapy from [place]. Start Vick's Vapor Rub to toenails once daily. -Patient to continue soft, supportive shoe gear daily. -Toenails 1-5 b/l were debrided in length and girth with sterile nail nippers and dremel without iatrogenic bleeding.  -Patient/POA to call should there be question/concern in the interim.   Return in about 3 months (around 09/29/2022).  Debra Woodward, DPM

## 2022-07-04 NOTE — Progress Notes (Signed)
Subjective:   Debra Woodward is a 71 y.o. female who presents for Medicare Annual (Subsequent) preventive examination.  Review of Systems     Cardiac Risk Factors include: advanced age (>2mn, >>58women);hypertension;smoking/ tobacco exposure     Objective:    Today's Vitals   07/04/22 0922  BP: 122/64  Pulse: 73  Temp: 99.1 F (37.3 C)  TempSrc: Oral  SpO2: 98%  Weight: 191 lb 3.2 oz (86.7 kg)  Height: 5' 4.75" (1.645 m)   Body mass index is 32.06 kg/m.     07/04/2022    9:47 AM 06/30/2021    3:26 PM 06/22/2020    2:10 PM 06/26/2019    8:20 AM 08/22/2018   10:28 AM 07/09/2015    9:45 AM  Advanced Directives  Does Patient Have a Medical Advance Directive? Yes Yes Yes Yes Yes Yes  Type of [place] will   Living will;Healthcare Power of Attorney  Does patient want to make changes to medical advance directive? No - Patient declined  No - Patient declined     Copy of [place] Chart? No - copy requested No - copy requested No - copy requested     Would patient like information on creating a medical advance directive? No - Patient declined         Current Medications (verified) Outpatient Encounter Medications as of 07/04/2022  Medication Sig   amLODipine (NORVASC) 5 MG tablet Take 1.5 tablet (7.5 mg total) by mouth daily   Ascorbic Acid (VITAMIN C) 500 MG CAPS Take 1 capsule by mouth daily.   aspirin 81 MG EC tablet 1 by mouth daily or 3 d per week.   cetirizine (ZYRTEC) 10 MG tablet Take 10 mg by mouth daily. Patient alternates with Allegra   Cholecalciferol (VITAMIN D3) 100000 UNIT/GM POWD Take by mouth.   desonide (DESOWEN) 0.05 % cream desonide 0.05 % topical cream  APPLY CREAM TOPICALLY TWICE DAILY TO ITCHY AREA ON FACE NOT IN THE EYE   diclofenac Sodium (VOLTAREN) 1 % GEL APPLY 2 GRAMS TOPICALLY TO AFFECTED AREA 4 TIMES  DAILY   docusate sodium (COLACE) 100 MG capsule Take 1 capsule (100 mg total) by mouth every 12 (twelve) hours.   EPINEPHrine (EPIPEN 2-PAK) 0.3 mg/0.3 mL IJ SOAJ injection Inject 0.3 mg into the muscle as needed for anaphylaxis.   fexofenadine (ALLEGRA) 60 MG tablet Take 60 mg by mouth daily. Patient alternates with Zyrtec   fluticasone (FLONASE) 50 MCG/ACT nasal spray USE 2 SPRAY(S) IN EACH NOSTRIL ONCE DAILY AT  NIGHT   Glycerin-Polysorbate 80 (REFRESH DRY EYE THERAPY OP) Apply to eye.   Multiple Vitamin (MULTIVITAMIN) tablet Take 1 tablet by mouth daily.   NONFORMULARY OR COMPOUNDED ITEM Apply 1-2 g topically daily. [facility]: Nail laquer   ondansetron (ZOFRAN) 4 MG tablet Take 1 tablet by mouth every 8 (eight) hours as needed.   triamcinolone ointment (KENALOG) 0.1 % Apply small amt twice per day x 14 days then twice weekly   VITAMIN D PO Take 5,000 Int'l Units by mouth every other day.   No facility-administered encounter medications on file as of 07/04/2022.    Allergies (verified) Bee venom and Prednisone   History: Past Medical History:  Diagnosis Date   ALLERGIC RHINITIS 09/14/2006   Allergy    Fibroid    Hx of adenomatous colonic polyps 07/28/2015   Seasonal allergies  Past Surgical History:  Procedure Laterality Date   ABDOMINAL HYSTERECTOMY  1996   BREAST EXCISIONAL BIOPSY Left 1983   No scar seen   BREAST EXCISIONAL BIOPSY Right 1987   NO scar seen   BREAST SURGERY Bilateral 1983, 1996   benign   CARPAL TUNNEL RELEASE Right 2008   COLONOSCOPY  07/23/2015   CYSTECTOMY Bilateral 1983, 1988   breast; benign   KNEE ARTHROSCOPY Right 2010   TOE SURGERY Bilateral    TOTAL KNEE ARTHROPLASTY Right 2011   Family History  Problem Relation Age of Onset   Stroke Mother    Diabetes Mother    Diabetes Father    Hypertension Sister    Esophageal cancer Paternal Grandfather 11   Colon cancer Neg Hx    Stomach cancer Neg Hx    Rectal cancer Neg Hx    Breast  cancer Neg Hx    Colon polyps Neg Hx    Social History   Socioeconomic History   Marital status: Single    Spouse name: Not on file   Number of children: Not on file   Years of education: Not on file   Highest education level: Bachelor's degree (e.g., BA, AB, BS)  Occupational History   Not on file  Tobacco Use   Smoking status: Every Day    Packs/day: 0.25    Types: Cigarettes   Smokeless tobacco: Never  Vaping Use   Vaping Use: Never used  Substance and Sexual Activity   Alcohol use: Yes    Alcohol/week: 2.0 standard drinks of alcohol    Types: 2 Glasses of wine per week    Comment: social   Drug use: No   Sexual activity: Yes    Birth control/protection: Surgical    Comment: HYST  Other Topics Concern   Not on file  Social History Narrative   Going to [facility] of 1   No pets       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    Social Determinants of Health   Financial Resource Strain: Low Risk  (07/04/2022)   Overall Financial Resource Strain (CARDIA)    Difficulty of Paying Living Expenses: Not very hard  Food Insecurity: Unknown (07/04/2022)   Hunger Vital Sign    Worried About Running Out of Food in the Last Year: Patient refused    [place] in the Last Year: Patient refused  Transportation Needs: No Transportation Needs (07/04/2022)   PRAPARE - Hydrologist (Medical): No    Lack of Transportation (Non-Medical): No  Physical Activity: Insufficiently Active (07/04/2022)   Exercise Vital Sign    Days of Exercise per Week: 1 day    Minutes of Exercise per Session: 10 min  Stress: No Stress Concern Present (07/04/2022)   [place]    Feeling of Stress : Only a little  Social Connections: Moderately Integrated (07/04/2022)   Social Connection and Isolation Panel [NHANES]    Frequency of Communication with Friends and Family: More than three times a week     Frequency of Social Gatherings with Friends and Family: Once a week    Attends Religious Services: More than 4 times per year    Active Member of [employer] or Organizations: Yes    Attends Music therapist: More than 4 times per year    Marital Status: Divorced    Tobacco  Counseling Ready to quit: Yes Counseling given: Yes   Clinical Intake:  Pre-visit preparation completed: Yes  Pain : No/denies pain     BMI - recorded: 32.61 Nutritional Status: BMI > 30  Obese Nutritional Risks: None Diabetes: No  How often do you need to have someone help you when you read instructions, pamphlets, or other written materials from your doctor or pharmacy?: 1 - Never  Diabetic?  No  Interpreter Needed?: No   Activities of Daily Living    07/04/2022    9:38 AM 06/29/2022    1:10 PM  In your present state of health, do you have any difficulty performing the following activities:  Hearing? 1 0  Comment Wears hearing aids   Vision? 0 0  Difficulty concentrating or making decisions? 0 0  Walking or climbing stairs? 0 0  Dressing or bathing? 0 0  Doing errands, shopping? 0 0  Preparing Food and eating ? N N  Using the Toilet? N N  In the past six months, have you accidently leaked urine? N N  Do you have problems with loss of bowel control? N N  Managing your Medications? N N  Managing your Finances? N N  Housekeeping or managing your Housekeeping? N N    Patient Care Team: Panosh, [name], MD as PCP - General (Internal Medicine) Huel Cote, NP (Inactive) as Nurse Practitioner (Obstetrics and Gynecology) Viona Gilmore, [facility] as Pharmacist (Pharmacist)  Indicate any recent Medical Services you may have received from other than [facility] providers in the past year (date may be approximate).     Assessment:   This is a routine wellness examination for Debra Woodward.  Hearing/Vision screen Hearing Screening - Comments:: Wears hearing aids Vision Screening - Comments:: Wears  glasses. Followed by Dr Herbert Deaner  Dietary issues and exercise activities discussed: Exercise limited by: None identified   Goals Addressed               This Visit's Progress     Lose weight (pt-stated)        Goal I'm reaching for is 165 lbs       Depression Screen    07/04/2022    9:36 AM 04/04/2022    3:40 PM 12/29/2021   11:32 AM 06/30/2021    3:28 PM 06/30/2021    3:24 PM 06/22/2020    2:13 PM 09/23/2019    3:51 PM  PHQ 2/9 Scores  PHQ - 2 Score 0 0 0 0 0 0 0  PHQ- 9 Score  5 6   0     Fall Risk    07/04/2022    9:39 AM 06/29/2022    1:10 PM 04/04/2022    3:40 PM 12/29/2021   11:31 AM 12/26/2021    8:28 PM  [place] in the past year? 0 0  1 1  Number falls in past yr: 0 0 0 0 0  Injury with Fall? 0 0 0 0 0  Risk for fall due to : No Fall Risks  History of fall(s)    Follow up   Falls evaluation completed      FALL RISK PREVENTION PERTAINING TO THE HOME:  Any stairs in or around the home? Yes  If so, are there any without handrails? No  Home free of loose throw rugs in walkways, pet beds, electrical cords, etc? Yes  Adequate lighting in your home to reduce risk of falls? Yes   ASSISTIVE DEVICES UTILIZED TO  PREVENT FALLS:  Life alert? No  Use of a cane, walker or w/c? No  Grab bars in the bathroom? No  Shower chair or bench in shower? Yes Elevated toilet seat or a handicapped toilet? No   TIMED UP AND GO:  Was the test performed? Yes .  Length of time to ambulate 10 feet: 10 sec.   Gait steady and fast without use of assistive device  Cognitive Function:        07/04/2022    9:47 AM  6CIT Screen  What Year? 0 points  What month? 0 points  What time? 0 points  Count back from 20 0 points  Months in reverse 0 points  Repeat phrase 0 points  Total Score 0 points    Immunizations Immunization History  Administered Date(s) Administered   Fluad Quad(high Dose 65+) 09/14/2021   Hep A / Hep B 01/25/2018, 02/27/2018, 08/10/2018    Influenza Split 08/07/2012, 08/23/2014   Influenza Whole 08/14/2001, 09/10/2007, 08/16/2010, 08/15/2011   Influenza, High Dose Seasonal PF 08/23/2017, 08/10/2018, 09/02/2019, 10/01/2020   Influenza,inj,Quad PF,6+ Mos 08/16/2016   Influenza-Unspecified 08/14/2013, 08/14/2017   PFIZER Comirnaty(Gray Top)Covid-19 Tri-Sucrose Vaccine 04/23/2021   PFIZER(Purple Top)SARS-COV-2 Vaccination 12/03/2019, 12/25/2019, 08/21/2020   Pfizer Covid-19 Vaccine Bivalent Booster 42yr & up 10/13/2021   Pneumococcal Conjugate-13 09/30/2016   Pneumococcal Polysaccharide-23 11/15/2011, 09/18/2018   Pneumococcal-Unspecified 07/15/2016   Td 03/13/2009   Tdap 01/25/2018   Zoster Recombinat (Shingrix) 03/22/2017, 12/11/2017   Zoster, Live 05/28/2012    TDAP status: Up to date  Flu Vaccine status: Up to date  Pneumococcal vaccine status: Up to date  Covid-19 vaccine status: Completed vaccines  Qualifies for Shingles Vaccine? Yes   Zostavax completed Yes   Shingrix Completed?: Yes  Screening Tests Health Maintenance  Topic Date Due   INFLUENZA VACCINE  06/14/2022   COVID-19 Vaccine (6 - Pfizer risk series) 07/20/2022 (Originally 12/08/2021)   MAMMOGRAM  03/01/2023   COLONOSCOPY (Pts 45-496yrInsurance coverage will need to be confirmed)  10/16/2023   TETANUS/TDAP  01/26/2028   Pneumonia Vaccine 71Years old  Completed   DEXA SCAN  Completed   Hepatitis C Screening  Completed   Zoster Vaccines- Shingrix  Completed   HPV VACCINES  Aged Out    Health Maintenance  Health Maintenance Due  Topic Date Due   INFLUENZA VACCINE  06/14/2022    Colorectal cancer screening: Type of screening: Colonoscopy. Completed 10/15/18. Repeat every 5 years  Mammogram status: Completed 02/28/22. Repeat every year  Bone Density status: Completed 04/14/20. Results reflect: Bone density results: NORMAL. Repeat every   years.  Lung Cancer Screening: (Low Dose CT Chest recommended if Age 941-80ears, 30 pack-year  currently smoking OR have quit w/in 15years.) does qualify.   Lung Cancer Screening Referral: Patient deferred  Additional Screening:  Hepatitis C Screening: does qualify; Completed 03/06/13  Vision Screening: Recommended annual ophthalmology exams for early detection of glaucoma and other disorders of the eye. Is the patient up to date with their annual eye exam?  Yes  Who is the provider or what is the name of the office in which the patient attends annual eye exams? Dr HeHerbert Deanerf pt is not established with a provider, would they like to be referred to a provider to establish care? No .   Dental Screening: Recommended annual dental exams for proper oral hygiene  Community Resource Referral / Chronic Care Management:   CRR required this visit?  No   CCM required this visit?  No      Plan:     I have personally reviewed and noted the following in the patient's chart:   Medical and social history Use of alcohol, tobacco or illicit drugs  Current medications and supplements including opioid prescriptions. Patient is not currently taking opioid prescriptions. Functional ability and status Nutritional status Physical activity Advanced directives List of other physicians Hospitalizations, surgeries, and ER visits in previous 12 months Vitals Screenings to include cognitive, depression, and falls Referrals and appointments  In addition, I have reviewed and discussed with patient certain preventive protocols, quality metrics, and best practice recommendations. A written personalized care plan for preventive services as well as general preventive health recommendations were provided to patient.     Criselda Peaches, LPN   [id number]   Nurse Notes: None

## 2022-07-04 NOTE — Patient Instructions (Addendum)
Debra Woodward , Thank you for taking time to come for your Medicare Wellness Visit. I appreciate your ongoing commitment to your health goals. Please review the following plan we discussed and let me know if I can assist you in the future.   These are the goals we discussed:  Goals       Lose weight (pt-stated)      Goal I'm reaching for is 165 lbs      Weight (lb) < 170 lb (77.1 kg)      Will substitute other healthier snacks for nuts x 1 week and see how it goes       Weight (lb) < 170 lb (77.1 kg)        This is a list of the screening recommended for you and due dates:  Health Maintenance  Topic Date Due   Flu Shot  06/14/2022   COVID-19 Vaccine (6 - Pfizer risk series) 07/20/2022*   Mammogram  03/01/2023   Colon Cancer Screening  10/16/2023   Tetanus Vaccine  01/26/2028   Pneumonia Vaccine  Completed   DEXA scan (bone density measurement)  Completed   Hepatitis C Screening: USPSTF Recommendation to screen - Ages 44-79 yo.  Completed   Zoster (Shingles) Vaccine  Completed   HPV Vaccine  Aged Out  *Topic was postponed. The date shown is not the original due date.   Advanced directives: Yes  Conditions/risks identified: None  Next appointment: Follow up in one year for your annual wellness visit     Preventive Care 65 Years and Older, Female Preventive care refers to lifestyle choices and visits with your health care provider that can promote health and wellness. What does preventive care include? A yearly physical exam. This is also called an annual well check. Dental exams once or twice a year. Routine eye exams. Ask your health care provider how often you should have your eyes checked. Personal lifestyle choices, including: Daily care of your teeth and gums. Regular physical activity. Eating a healthy diet. Avoiding tobacco and drug use. Limiting alcohol use. Practicing safe sex. Taking low-dose aspirin every day. Taking vitamin and mineral supplements as  recommended by your health care provider. What happens during an annual well check? The services and screenings done by your health care provider during your annual well check will depend on your age, overall health, lifestyle risk factors, and family history of disease. Counseling  Your health care provider may ask you questions about your: Alcohol use. Tobacco use. Drug use. Emotional well-being. Home and relationship well-being. Sexual activity. Eating habits. History of falls. Memory and ability to understand (cognition). Work and work Statistician. Reproductive health. Screening  You may have the following tests or measurements: Height, weight, and BMI. Blood pressure. Lipid and cholesterol levels. These may be checked every 5 years, or more frequently if you are over 77 years old. Skin check. Lung cancer screening. You may have this screening every year starting at age 28 if you have a 30-pack-year history of smoking and currently smoke or have quit within the past 15 years. Fecal occult blood test (FOBT) of the stool. You may have this test every year starting at age 68. Flexible sigmoidoscopy or colonoscopy. You may have a sigmoidoscopy every 5 years or a colonoscopy every 10 years starting at age 27. Hepatitis C blood test. Hepatitis B blood test. Sexually transmitted disease (STD) testing. Diabetes screening. This is done by checking your blood sugar (glucose) after you have not eaten for a  while (fasting). You may have this done every 1-3 years. Bone density scan. This is done to screen for osteoporosis. You may have this done starting at age 74. Mammogram. This may be done every 1-2 years. Talk to your health care provider about how often you should have regular mammograms. Talk with your health care provider about your test results, treatment options, and if necessary, the need for more tests. Vaccines  Your health care provider may recommend certain vaccines, such  as: Influenza vaccine. This is recommended every year. Tetanus, diphtheria, and acellular pertussis (Tdap, Td) vaccine. You may need a Td booster every 10 years. Zoster vaccine. You may need this after age 30. Pneumococcal 13-valent conjugate (PCV13) vaccine. One dose is recommended after age 65. Pneumococcal polysaccharide (PPSV23) vaccine. One dose is recommended after age 63. Talk to your health care provider about which screenings and vaccines you need and how often you need them. This information is not intended to replace advice given to you by your health care provider. Make sure you discuss any questions you have with your health care provider. Document Released: 11/27/2015 Document Revised: 07/20/2016 Document Reviewed: 09/01/2015 Elsevier Interactive Patient Education  2017 [place] Prevention in the Home Falls can cause injuries. They can happen to people of all ages. There are many things you can do to make your home safe and to help prevent falls. What can I do on the outside of my home? Regularly fix the edges of walkways and driveways and fix any cracks. Remove anything that might make you trip as you walk through a door, such as a raised step or threshold. Trim any bushes or trees on the path to your home. Use bright outdoor lighting. Clear any walking paths of anything that might make someone trip, such as rocks or tools. Regularly check to see if handrails are loose or broken. Make sure that both sides of any steps have handrails. Any raised decks and porches should have guardrails on the edges. Have any leaves, snow, or ice cleared regularly. Use sand or salt on walking paths during winter. Clean up any spills in your garage right away. This includes oil or grease spills. What can I do in the bathroom? Use night lights. Install grab bars by the toilet and in the tub and shower. Do not use towel bars as grab bars. Use non-skid mats or decals in the tub or  shower. If you need to sit down in the shower, use a plastic, non-slip stool. Keep the floor dry. Clean up any water that spills on the floor as soon as it happens. Remove soap buildup in the tub or shower regularly. Attach bath mats securely with double-sided non-slip rug tape. Do not have throw rugs and other things on the floor that can make you trip. What can I do in the bedroom? Use night lights. Make sure that you have a light by your bed that is easy to reach. Do not use any sheets or blankets that are too big for your bed. They should not hang down onto the floor. Have a firm chair that has side arms. You can use this for support while you get dressed. Do not have throw rugs and other things on the floor that can make you trip. What can I do in the kitchen? Clean up any spills right away. Avoid walking on wet floors. Keep items that you use a lot in easy-to-reach places. If you need to reach something above you,  use a strong step stool that has a grab bar. Keep electrical cords out of the way. Do not use floor polish or wax that makes floors slippery. If you must use wax, use non-skid floor wax. Do not have throw rugs and other things on the floor that can make you trip. What can I do with my stairs? Do not leave any items on the stairs. Make sure that there are handrails on both sides of the stairs and use them. Fix handrails that are broken or loose. Make sure that handrails are as long as the stairways. Check any carpeting to make sure that it is firmly attached to the stairs. Fix any carpet that is loose or worn. Avoid having throw rugs at the top or bottom of the stairs. If you do have throw rugs, attach them to the floor with carpet tape. Make sure that you have a light switch at the top of the stairs and the bottom of the stairs. If you do not have them, ask someone to add them for you. What else can I do to help prevent falls? Wear shoes that: Do not have high heels. Have  rubber bottoms. Are comfortable and fit you well. Are closed at the toe. Do not wear sandals. If you use a stepladder: Make sure that it is fully opened. Do not climb a closed stepladder. Make sure that both sides of the stepladder are locked into place. Ask someone to hold it for you, if possible. Clearly mark and make sure that you can see: Any grab bars or handrails. First and last steps. Where the edge of each step is. Use tools that help you move around (mobility aids) if they are needed. These include: Canes. Walkers. Scooters. Crutches. Turn on the lights when you go into a dark area. Replace any light bulbs as soon as they burn out. Set up your furniture so you have a clear path. Avoid moving your furniture around. If any of your floors are uneven, fix them. If there are any pets around you, be aware of where they are. Review your medicines with your doctor. Some medicines can make you feel dizzy. This can increase your chance of falling. Ask your doctor what other things that you can do to help prevent falls. This information is not intended to replace advice given to you by your health care provider. Make sure you discuss any questions you have with your health care provider. Document Released: 08/27/2009 Document Revised: 04/07/2016 Document Reviewed: 12/05/2014 Elsevier Interactive Patient Education  2017 [employer].

## 2022-07-26 ENCOUNTER — Other Ambulatory Visit: Payer: Self-pay | Admitting: Physician Assistant

## 2022-07-26 DIAGNOSIS — K1121 Acute sialoadenitis: Secondary | ICD-10-CM | POA: Diagnosis not present

## 2022-07-26 DIAGNOSIS — K1122 Acute recurrent sialoadenitis: Secondary | ICD-10-CM | POA: Insufficient documentation

## 2022-07-26 DIAGNOSIS — H903 Sensorineural hearing loss, bilateral: Secondary | ICD-10-CM | POA: Diagnosis not present

## 2022-08-10 ENCOUNTER — Ambulatory Visit: Payer: Medicare HMO | Admitting: Podiatry

## 2022-08-12 ENCOUNTER — Telehealth: Payer: Self-pay | Admitting: Internal Medicine

## 2022-08-12 NOTE — Telephone Encounter (Signed)
Pt called to ask if MD thought it was safe for Pt to get an RSV shot, being that she is a heavy smoker?  Pt also would like to know if she should get this shot by itself, or can she take it with other vaccines?  Pt was informed that MD is on vacation until 08/24/22.  Please advise.

## 2022-08-12 NOTE — Telephone Encounter (Signed)
Spoke to [employer], Np verbally that it was okay to still get the vaccine. PT notified of update and verbalized understanding.

## 2022-08-18 ENCOUNTER — Other Ambulatory Visit: Payer: Self-pay | Admitting: Internal Medicine

## 2022-08-22 ENCOUNTER — Ambulatory Visit
Admission: RE | Admit: 2022-08-22 | Discharge: 2022-08-22 | Disposition: A | Discharge status: Home / Self Care | Payer: Medicare HMO | Source: Ambulatory Visit | Attending: Physician Assistant | Admitting: Physician Assistant

## 2022-08-22 DIAGNOSIS — K1121 Acute sialoadenitis: Secondary | ICD-10-CM

## 2022-08-22 DIAGNOSIS — R221 Localized swelling, mass and lump, neck: Secondary | ICD-10-CM | POA: Diagnosis not present

## 2022-08-22 MED ORDER — IOPAMIDOL (ISOVUE-300) INJECTION 61%
75.0000 mL | Freq: Once | INTRAVENOUS | Status: AC | PRN
  Administered 2022-08-22: 75 mL via INTRAVENOUS

## 2022-08-29 ENCOUNTER — Telehealth: Payer: Self-pay | Admitting: Internal Medicine

## 2022-08-29 NOTE — Telephone Encounter (Signed)
Pt called to ask that her prescription for the  amLODipine (NORVASC) 5 MG tablet be called in as soon as possible via:  [place], [place] Phone:  [phone]  Fax:  [phone]     Pt is stating that she is already out because she was instructed to take one and a half pills and [facility] is telling her she has to wait until 09/05/22.   Pt states she is completely out and needs this medication as soon as possible.

## 2022-08-30 MED ORDER — AMLODIPINE BESYLATE 5 MG PO TABS: ORAL_TABLET | ORAL | 1 refills | Status: DC

## 2022-08-30 NOTE — Telephone Encounter (Signed)
Rx sent 

## 2022-09-19 DIAGNOSIS — H6123 Impacted cerumen, bilateral: Secondary | ICD-10-CM | POA: Diagnosis not present

## 2022-09-19 DIAGNOSIS — K1122 Acute recurrent sialoadenitis: Secondary | ICD-10-CM | POA: Diagnosis not present

## 2022-10-03 ENCOUNTER — Ambulatory Visit: Payer: Medicare HMO | Admitting: Podiatry

## 2022-10-03 ENCOUNTER — Encounter: Payer: Self-pay | Admitting: Podiatry

## 2022-10-03 DIAGNOSIS — M79675 Pain in left toe(s): Secondary | ICD-10-CM

## 2022-10-03 DIAGNOSIS — M79674 Pain in right toe(s): Secondary | ICD-10-CM

## 2022-10-03 DIAGNOSIS — B351 Tinea unguium: Secondary | ICD-10-CM

## 2022-10-03 NOTE — Progress Notes (Signed)
  Subjective:  Patient ID: Debra Woodward, female    DOB: 06/26/1951,  MRN: [id number]  Debra Woodward presents to clinic today for painful thick toenails that are difficult to trim. Pain interferes with ambulation. Aggravating factors include wearing enclosed shoe gear. Pain is relieved with periodic professional debridement. Patient states she has been using Vick's Vapor Rub as instructed and thinks appearance of toenails has improved. Chief Complaint  Patient presents with   Nail Problem    Routine foot care PCP-Panosh PCP VST-03/2022   New problem(s): None.   PCP is Panosh, [name], MD.  Allergies  Allergen Reactions   Bee Venom Hives   Prednisone Other (See Comments)    hallucinations    Review of Systems: Negative except as noted in the HPI.  Objective: No changes noted in today's physical examination.  Debra Woodward is a pleasant 71 y.o. female WD, WN in NAD. AAO x 3.  Vascular Examination: Vascular status intact b/l with palpable pedal pulses. Pedal hair present b/l. CFT <3 seconds b/l. No edema. No pain with calf compression b/l. Skin temperature gradient WNL b/l.   Neurological Examination: Sensation grossly intact b/l with 10 gram monofilament. Vibratory sensation intact b/l.   Dermatological Examination: Pedal skin with normal turgor, texture and tone b/l. Toenails appearance in color has improved. Distal 1/3 of toenails 1-5 b/l remain thick, discolored, elongated with subungual debris and pain on dorsal palpation. No hyperkeratotic lesions noted b/l.   Musculoskeletal Examination: Muscle strength 5/5 to b/l LE. Limited joint ROM to the right ankle. Patient ambulates independent of any assistive aids.  Radiographs: None Assessment/Plan: 1. Pain due to onychomycosis of toenails of both feet     No orders of the defined types were placed in this encounter.   -Consent given for treatment as described below: -Examined patient. -Continue Vick's Vapor Rub to  toenails once daily. -Mycotic toenails 1-5 bilaterally were debrided in length and girth with sterile nail nippers and dremel without incident. -Patient/POA to call should there be question/concern in the interim.   Return in about 3 months (around 01/03/2023).  Debra Woodward, DPM

## 2022-10-08 NOTE — Progress Notes (Signed)
Chief Complaint  Patient presents with   Annual Exam    HPI: Patient  Debra Woodward  71 y.o. comes in today for Preventive Health Care visit   BP:    local amlodipine .  ONLY TAKING 5 MG doing ok  Had  salivary gland eval dr Kathe Mariner for sialitis   neg scan   rx with antibiotic  and not taken yet.  Hx of partial parotidectomy.  Recent loss of brp  prev sister  effects sleep   Side pain  for 4 hours last pm  felt like gas.  No injury better with sleeping on it  no recurrance  [place] use of  albuterol inhaler in  past   in winter uses at times  per dr Leanne Chang. No current wheeze   6 cig per day Needs new derm dr taffeen retired Health Maintenance  Topic Date Due   COVID-19 Vaccine (7 - 2023-24 season) 11/27/2022   MAMMOGRAM  03/01/2023   Medicare Annual Wellness ([place])  07/05/2023   COLONOSCOPY (Pts 45-41yr Insurance coverage will need to be confirmed)  10/16/2023   Pneumonia Vaccine 71 Years old  Completed   INFLUENZA VACCINE  Completed   DEXA SCAN  Completed   Hepatitis C Screening  Completed   Zoster Vaccines- Shingrix  Completed   HPV VACCINES  Aged Out   Health Maintenance Review LIFESTYLE:  Exercise:   sometimes  Tobacco/ETS:  4 per day .  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   ROS:  G  REST of 12 system review negative except as per HPI   Past Medical History:  Diagnosis Date   ALLERGIC RHINITIS 09/14/2006   Allergy    Fibroid    Hx of adenomatous colonic polyps 07/28/2015   Seasonal allergies     Past Surgical History:  Procedure Laterality Date   ABDOMINAL HYSTERECTOMY  1996   BREAST EXCISIONAL BIOPSY Left 1983   No scar seen   BREAST EXCISIONAL BIOPSY Right 1987   NO scar seen   BREAST SURGERY Bilateral 1983, 1996   benign   CARPAL TUNNEL RELEASE Right 2008   COLONOSCOPY  07/23/2015   CYSTECTOMY Bilateral 1983, 1988   breast;  benign   KNEE ARTHROSCOPY Right 2010   TOE SURGERY Bilateral    TOTAL KNEE ARTHROPLASTY Right 2011    Family History  Problem Relation Age of Onset   Stroke Mother    Diabetes Mother    Diabetes Father    Hypertension Sister    Esophageal cancer Paternal Grandfather 538  Colon cancer Neg Hx    Stomach cancer Neg Hx    Rectal cancer Neg Hx    Breast cancer Neg Hx    Colon polyps Neg Hx     Social History   Socioeconomic History   Marital status: Single    Spouse name: Not on file   Number of children: Not on file   Years of education: Not on file   Highest education level: Bachelor's degree (e.g., BA, AB, BS)  Occupational History   Not on file  Tobacco Use   Smoking status: Every Day    Packs/day: 0.25    Types: Cigarettes   Smokeless tobacco: Never  Vaping Use   Vaping Use: Never used  Substance and Sexual Activity   Alcohol use: Yes  Alcohol/week: 2.0 standard drinks of alcohol    Types: 2 Glasses of wine per week    Comment: social   Drug use: No   Sexual activity: Yes    Birth control/protection: Surgical    Comment: HYST  Other Topics Concern   Not on file  Social History Narrative   Going to [facility] of 1   No pets       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    Social Determinants of Health   Financial Resource Strain: Low Risk  (07/04/2022)   Overall Financial Resource Strain (CARDIA)    Difficulty of Paying Living Expenses: Not very hard  Food Insecurity: Unknown (07/04/2022)   Hunger Vital Sign    Worried About Running Out of Food in the Last Year: Patient refused    [place] in the Last Year: Patient refused  Transportation Needs: No Transportation Needs (07/04/2022)   PRAPARE - Hydrologist (Medical): No    Lack of Transportation (Non-Medical): No  Physical Activity: Insufficiently Active (07/04/2022)   Exercise Vital Sign    Days of Exercise per Week: 1 day    Minutes of Exercise per Session:  10 min  Stress: No Stress Concern Present (07/04/2022)   [place]    Feeling of Stress : Only a little  Social Connections: Moderately Integrated (07/04/2022)   Social Connection and Isolation Panel [NHANES]    Frequency of Communication with Friends and Family: More than three times a week    Frequency of Social Gatherings with Friends and Family: Once a week    Attends Religious Services: More than 4 times per year    Active Member of [employer] or Organizations: Yes    Attends Music therapist: More than 4 times per year    Marital Status: Divorced    Outpatient Medications Prior to Visit  Medication Sig Dispense Refill   amLODipine (NORVASC) 5 MG tablet Take 1 tablet by mouth once daily 90 tablet 1   Ascorbic Acid (VITAMIN C) 500 MG CAPS Take 1 capsule by mouth daily.     aspirin 81 MG EC tablet 1 by mouth daily or 3 d per week. 30 tablet 12   cetirizine (ZYRTEC) 10 MG tablet Take 10 mg by mouth daily. Patient alternates with Allegra     desonide (DESOWEN) 0.05 % cream as needed.     diclofenac Sodium (VOLTAREN) 1 % GEL as needed.     docusate sodium (COLACE) 100 MG capsule Take 1 capsule (100 mg total) by mouth every 12 (twelve) hours. 30 capsule 0   EPINEPHrine (EPIPEN 2-PAK) 0.3 mg/0.3 mL IJ SOAJ injection Inject 0.3 mg into the muscle as needed for anaphylaxis. 1 each 1   fexofenadine (ALLEGRA) 60 MG tablet Take 60 mg by mouth daily. Patient alternates with Zyrtec     fluticasone (FLONASE) 50 MCG/ACT nasal spray USE 2 SPRAY(S) IN EACH NOSTRIL ONCE DAILY AT  NIGHT 16 g 6   Glycerin-Polysorbate 80 (REFRESH DRY EYE THERAPY OP) Apply to eye.     Multiple Vitamin (MULTIVITAMIN) tablet Take 1 tablet by mouth daily.     NONFORMULARY OR COMPOUNDED ITEM Apply 1-2 g topically daily. [facility]: Nail laquer 120 each 3   ondansetron (ZOFRAN) 4 MG tablet Take 1 tablet by mouth every 8 (eight) hours as needed.      triamcinolone ointment (  KENALOG) 0.1 % Apply small amt twice per day x 14 days then twice weekly 30 g 1   VITAMIN D PO Take 5,000 Int'l Units by mouth every other day.     clindamycin (CLEOCIN) 300 MG capsule Take 300 mg by mouth 3 (three) times daily. (Patient not taking: Reported on 10/10/2022)     Cholecalciferol (VITAMIN D3) 100000 UNIT/GM POWD Take by mouth.     No facility-administered medications prior to visit.     EXAM:  BP 128/72 (BP Location: Left Arm, Patient Position: Sitting, Cuff Size: Large)   Pulse 79   Temp 97.9 F (36.6 C) (Oral)   Ht 5' 3.25" (1.607 m)   Wt 189 lb 3.2 oz (85.8 kg)   SpO2 94%   BMI 33.25 kg/m   Body mass index is 33.25 kg/m. Wt Readings from Last 3 Encounters:  10/10/22 189 lb 3.2 oz (85.8 kg)  07/04/22 191 lb 3.2 oz (86.7 kg)  04/04/22 188 lb 9.6 oz (85.5 kg)    Physical Exam: Vital signs reviewed BTD:HRCB is a well-developed well-nourished alert cooperative    who appearsr stated age in no acute distress.  HEENT: normocephalic atraumatic , Eyes: PERRL EOM's full, conjunctiva clear, Nares: paten,t no deformity discharge or tenderness., Ears: no deformity EAC's clear TMs with normal landmarks. Mouth: clear OP, no lesions, edema.  Moist mucous membranes. Dentition in adequate repair. NECK: supple without masses, thyromegaly or bruits. CHEST/PULM:  Clear to auscultation and percussion breath sounds equal no wheeze , rales or rhonchi. No chest wall deformities or tenderness. Breast: normal by inspection . No dimpling, discharge, masses, tenderness or discharge . CV: PMI is nondisplaced, S1 S2 no gallops, murmurs, rubs. Peripheral pulses are full without delay.No JVD .  ABDOMEN: Bowel sounds normal nontender  No guard or rebound, no hepato splenomegal no CVA tenderness.   Extremtities:  No clubbing cyanosis or edema, no acute joint swelling or redness no focal atrophy NEURO:  Oriented x3, cranial nerves 3-12 appear to be intact, no obvious  focal weakness,gait within normal limits no abnormal reflexes or asymmetrical SKIN: No acute rashes normal turgor, color, no bruising or petechiae. Left foot  round keratotic leions left lateral foot  PSYCH: Oriented, good eye contact, no obvious depression anxiety, cognition and judgment appear normal. LN: no cervical axillary denopathy  Lab Results  Component Value Date   WBC 7.1 10/10/2022   HGB 14.0 10/10/2022   HCT 41.3 10/10/2022   PLT 468.0 (H) 10/10/2022   GLUCOSE 117 (H) 10/10/2022   CHOL 164 10/10/2022   TRIG 107.0 10/10/2022   HDL 68.40 10/10/2022   LDLCALC 75 10/10/2022   ALT 12 10/10/2022   AST 19 10/10/2022   NA 137 10/10/2022   K 4.5 10/10/2022   CL 102 10/10/2022   CREATININE 0.87 10/10/2022   BUN 12 10/10/2022   CO2 28 10/10/2022   TSH 2.49 09/22/2021   INR 0.94 09/07/2010   HGBA1C 6.6 (H) 10/10/2022    BP Readings from Last 3 Encounters:  10/10/22 128/72  07/04/22 122/64  04/04/22 124/72    Lab plan  reviewed with patient   update is fasting   ASSESSMENT AND PLAN:  Discussed the following assessment and plan:    ICD-10-CM   1. Visit for preventive health examination  Z00.00     2. Medication management  U38.453 Basic metabolic panel    CBC with Differential/Platelet    Hepatic function panel    Lipid panel    Hemoglobin A1c  Hemoglobin A1c    Lipid panel    Hepatic function panel    CBC with Differential/Platelet    Basic metabolic panel    3. Essential hypertension  D98 Basic metabolic panel    CBC with Differential/Platelet    Hepatic function panel    Lipid panel    Hemoglobin A1c    Hemoglobin A1c    Lipid panel    Hepatic function panel    CBC with Differential/Platelet    Basic metabolic panel    4. Tobacco use disorder  F17.200     5. Hyperglycemia  P38.2 Basic metabolic panel    CBC with Differential/Platelet    Hepatic function panel    Lipid panel    Hemoglobin A1c    Hemoglobin A1c    Lipid panel    Hepatic  function panel    CBC with Differential/Platelet    Basic metabolic panel    6. Hypertensive retinopathy, unspecified laterality  N05.397 Basic metabolic panel    CBC with Differential/Platelet    Hepatic function panel    Lipid panel    Hemoglobin A1c    Hemoglobin A1c    Lipid panel    Hepatic function panel    CBC with Differential/Platelet    Basic metabolic panel    7. Screening for HIV (human immunodeficiency virus)  Z11.4 HIV antibody (with reflex)    8. Bereavement  Z63.4     Bp seems controlled  Yest still work on [employer] tobacco . Albuterol as used in past   but fu if sx  Is utd on immunizations  See derm about the skin area  or follow  Return for depending on results 6-12 month.  Patient Care Team: Adilen Pavelko, [name], MD as PCP - General (Internal Medicine) Huel Cote, NP (Inactive) as Nurse Practitioner (Obstetrics and Gynecology) Viona Gilmore, [facility] as Pharmacist (Pharmacist) Patient Instructions  Good to see you today . Bp is good so ok to stay on 5 mg amlodipine.   Continue to work on [employer] on your family loss.  Gentle Attention  to   yourself .  Will send in albuterol inhaler to  mail away.  Lab today .    [name]. Devany Aja M.D.

## 2022-10-10 ENCOUNTER — Encounter: Payer: Self-pay | Admitting: Internal Medicine

## 2022-10-10 ENCOUNTER — Ambulatory Visit (INDEPENDENT_AMBULATORY_CARE_PROVIDER_SITE_OTHER): Payer: Medicare HMO | Admitting: Internal Medicine

## 2022-10-10 VITALS — BP 128/72 | HR 79 | Temp 97.9°F | Ht 63.25 in | Wt 189.2 lb

## 2022-10-10 DIAGNOSIS — R739 Hyperglycemia, unspecified: Secondary | ICD-10-CM | POA: Diagnosis not present

## 2022-10-10 DIAGNOSIS — I10 Essential (primary) hypertension: Secondary | ICD-10-CM | POA: Diagnosis not present

## 2022-10-10 DIAGNOSIS — Z114 Encounter for screening for human immunodeficiency virus [HIV]: Secondary | ICD-10-CM | POA: Diagnosis not present

## 2022-10-10 DIAGNOSIS — Z0000 Encounter for general adult medical examination without abnormal findings: Secondary | ICD-10-CM

## 2022-10-10 DIAGNOSIS — Z634 Disappearance and death of family member: Secondary | ICD-10-CM | POA: Diagnosis not present

## 2022-10-10 DIAGNOSIS — Z79899 Other long term (current) drug therapy: Secondary | ICD-10-CM

## 2022-10-10 DIAGNOSIS — F17200 Nicotine dependence, unspecified, uncomplicated: Secondary | ICD-10-CM

## 2022-10-10 DIAGNOSIS — H35039 Hypertensive retinopathy, unspecified eye: Secondary | ICD-10-CM | POA: Diagnosis not present

## 2022-10-10 LAB — CBC WITH DIFFERENTIAL/PLATELET
Basophils Absolute: 0 K/uL (ref 0.0–0.1)
Basophils Relative: 0.4 % (ref 0.0–3.0)
Eosinophils Absolute: 0.2 K/uL (ref 0.0–0.7)
Eosinophils Relative: 2.6 % (ref 0.0–5.0)
HCT: 41.3 % (ref 36.0–46.0)
Hemoglobin: 14 g/dL (ref 12.0–15.0)
Lymphocytes Relative: 30.8 % (ref 12.0–46.0)
Lymphs Abs: 2.2 K/uL (ref 0.7–4.0)
MCHC: 33.9 g/dL (ref 30.0–36.0)
MCV: 96.9 fl (ref 78.0–100.0)
Monocytes Absolute: 0.8 K/uL (ref 0.1–1.0)
Monocytes Relative: 11.2 % (ref 3.0–12.0)
Neutro Abs: 3.9 K/uL (ref 1.4–7.7)
Neutrophils Relative %: 55 % (ref 43.0–77.0)
Platelets: 468 K/uL — ABNORMAL HIGH (ref 150.0–400.0)
RBC: 4.26 Mil/uL (ref 3.87–5.11)
RDW: 14.9 % (ref 11.5–15.5)
WBC: 7.1 K/uL (ref 4.0–10.5)

## 2022-10-10 LAB — BASIC METABOLIC PANEL WITH GFR
BUN: 12 mg/dL (ref 6–23)
CO2: 28 meq/L (ref 19–32)
Calcium: 10.2 mg/dL (ref 8.4–10.5)
Chloride: 102 meq/L (ref 96–112)
Creatinine, Ser: 0.87 mg/dL (ref 0.40–1.20)
GFR: 67.21 mL/min
Glucose, Bld: 117 mg/dL — ABNORMAL HIGH (ref 70–99)
Potassium: 4.5 meq/L (ref 3.5–5.1)
Sodium: 137 meq/L (ref 135–145)

## 2022-10-10 LAB — HEMOGLOBIN A1C: Hgb A1c MFr Bld: 6.6 % — ABNORMAL HIGH (ref 4.6–6.5)

## 2022-10-10 LAB — LIPID PANEL
Cholesterol: 164 mg/dL (ref 0–200)
HDL: 68.4 mg/dL
LDL Cholesterol: 75 mg/dL (ref 0–99)
NonHDL: 95.9
Total CHOL/HDL Ratio: 2
Triglycerides: 107 mg/dL (ref 0.0–149.0)
VLDL: 21.4 mg/dL (ref 0.0–40.0)

## 2022-10-10 LAB — HEPATIC FUNCTION PANEL
ALT: 12 U/L (ref 0–35)
AST: 19 U/L (ref 0–37)
Albumin: 4.5 g/dL (ref 3.5–5.2)
Alkaline Phosphatase: 92 U/L (ref 39–117)
Bilirubin, Direct: 0.1 mg/dL (ref 0.0–0.3)
Total Bilirubin: 0.5 mg/dL (ref 0.2–1.2)
Total Protein: 7.5 g/dL (ref 6.0–8.3)

## 2022-10-10 MED ORDER — ALBUTEROL SULFATE HFA 108 (90 BASE) MCG/ACT IN AERS: 1.0000 | INHALATION_SPRAY | Freq: Four times a day (QID) | RESPIRATORY_TRACT | 1 refills | Status: AC | PRN

## 2022-10-10 NOTE — Patient Instructions (Addendum)
Good to see you today . Bp is good so ok to stay on 5 mg amlodipine.   Continue to work on [employer] on your family loss.  Gentle Attention  to   yourself .  Will send in albuterol inhaler to  mail away.  Lab today .

## 2022-10-11 LAB — HIV ANTIBODY (ROUTINE TESTING W REFLEX): HIV 1&2 Ab, 4th Generation: NONREACTIVE

## 2022-10-25 DIAGNOSIS — H04123 Dry eye syndrome of bilateral lacrimal glands: Secondary | ICD-10-CM | POA: Diagnosis not present

## 2022-10-25 DIAGNOSIS — H40033 Anatomical narrow angle, bilateral: Secondary | ICD-10-CM | POA: Diagnosis not present

## 2022-10-25 DIAGNOSIS — H2513 Age-related nuclear cataract, bilateral: Secondary | ICD-10-CM | POA: Diagnosis not present

## 2022-10-25 DIAGNOSIS — H40023 Open angle with borderline findings, high risk, bilateral: Secondary | ICD-10-CM | POA: Diagnosis not present

## 2022-11-03 ENCOUNTER — Ambulatory Visit (INDEPENDENT_AMBULATORY_CARE_PROVIDER_SITE_OTHER): Payer: Medicare HMO | Admitting: Nurse Practitioner

## 2022-11-03 ENCOUNTER — Encounter: Payer: Self-pay | Admitting: Nurse Practitioner

## 2022-11-03 VITALS — BP 110/72 | HR 72 | Resp 16

## 2022-11-03 DIAGNOSIS — L292 Pruritus vulvae: Secondary | ICD-10-CM

## 2022-11-03 DIAGNOSIS — N898 Other specified noninflammatory disorders of vagina: Secondary | ICD-10-CM | POA: Diagnosis not present

## 2022-11-03 LAB — WET PREP FOR TRICH, YEAST, CLUE

## 2022-11-03 MED ORDER — FLUCONAZOLE 150 MG PO TABS: 150.0000 mg | ORAL_TABLET | Freq: Once | ORAL | 0 refills | Status: AC

## 2022-11-03 NOTE — Progress Notes (Signed)
   Acute Office Visit  Subjective:    Patient ID: Debra Woodward, female    DOB: 1950/12/16, 71 y.o.   MRN: [id number]   HPI 71 y.o. presents today for vulvar itching x 4-5 days. Little bit of white discharge. Finishing up antibiotic course for left ear infection. Going out of town tomorrow and is worried about an infection.    Review of Systems  Constitutional: Negative.   Genitourinary:  Positive for vaginal discharge and vaginal pain (Itching).       Objective:    Physical Exam Constitutional:      Appearance: Normal appearance.  Genitourinary:    General: Normal vulva.     Vagina: Normal. No vaginal discharge or erythema.     BP 110/72   Pulse 72   Resp 16  Wt Readings from Last 3 Encounters:  10/10/22 189 lb 3.2 oz (85.8 kg)  07/04/22 191 lb 3.2 oz (86.7 kg)  04/04/22 188 lb 9.6 oz (85.5 kg)        Patient informed chaperone available to be present for breast and/or pelvic exam. Patient has requested no chaperone to be present. Patient has been advised what will be completed during breast and pelvic exam.   Wet prep negative  Assessment & Plan:   Problem List Items Addressed This Visit   None Visit Diagnoses     Vulvar itching    -  Primary   Relevant Medications   fluconazole (DIFLUCAN) 150 MG tablet   Other Relevant Orders   WET PREP FOR [place], YEAST, CLUE      Plan: Negative wet prep and exam. Diflucan 150 mg x 1 provided since she is going out of town. Has triamcinolone ointment at home and will use BID x 7 days.      [place], 12:14 PM 11/03/2022

## 2023-01-17 ENCOUNTER — Other Ambulatory Visit: Payer: Self-pay | Admitting: Internal Medicine

## 2023-01-17 DIAGNOSIS — Z1231 Encounter for screening mammogram for malignant neoplasm of breast: Secondary | ICD-10-CM

## 2023-01-23 ENCOUNTER — Other Ambulatory Visit: Payer: Self-pay | Admitting: Internal Medicine

## 2023-01-25 ENCOUNTER — Encounter: Payer: Self-pay | Admitting: Internal Medicine

## 2023-01-25 ENCOUNTER — Ambulatory Visit: Payer: Medicare HMO | Admitting: Podiatry

## 2023-01-25 ENCOUNTER — Encounter: Payer: Self-pay | Admitting: Podiatry

## 2023-01-25 VITALS — BP 148/85

## 2023-01-25 DIAGNOSIS — M79675 Pain in left toe(s): Secondary | ICD-10-CM

## 2023-01-25 DIAGNOSIS — M79674 Pain in right toe(s): Secondary | ICD-10-CM | POA: Diagnosis not present

## 2023-01-25 DIAGNOSIS — B351 Tinea unguium: Secondary | ICD-10-CM | POA: Diagnosis not present

## 2023-01-25 NOTE — Progress Notes (Signed)
  Subjective:  Patient ID: Debra Woodward, female    DOB: 1951-03-27,  MRN: [id number]  Debra Woodward presents to clinic today for painful elongated mycotic toenails 1-5 bilaterally which are tender when wearing enclosed shoe gear. Pain is relieved with periodic professional debridement.   Patient states she started using leftover compounded topical antifungal medication from [place]. Chief Complaint  Patient presents with   Nail Problem    RFC PCP-Panosh PCP VST-09/2022   New problem(s): None.   PCP is Panosh, [name], MD.  Allergies  Allergen Reactions   Bee Venom Hives   Prednisone Other (See Comments)    hallucinations   Review of Systems: Negative except as noted in the HPI.  Objective: No changes noted in today's physical examination. Vitals:   01/25/23 0953  BP: (!) 148/85    Debra Woodward is a pleasant 72 y.o. female WD, WN in NAD. AAO x 3. Vascular Examination: Vascular status intact b/l with palpable pedal pulses. Pedal hair present b/l. CFT <3 seconds b/l. No edema. No pain with calf compression b/l. Skin temperature gradient WNL b/l.   Neurological Examination: Sensation grossly intact b/l with 10 gram monofilament. Vibratory sensation intact b/l.   Dermatological Examination: Pedal skin with normal turgor, texture and tone b/l. Toenails appearance in color has improved. Distal 1/3 of toenails 1-5 b/l remain thick, discolored, elongated with subungual debris and pain on dorsal palpation. No hyperkeratotic lesions noted b/l.   Musculoskeletal Examination: Muscle strength 5/5 to b/l LE. Limited joint ROM to the right ankle. Patient ambulates independent of any assistive aids.  Radiographs: None  Assessment/Plan: 1. Pain due to onychomycosis of toenails of both feet     -Patient was evaluated and treated. All patient's and/or POA's questions/concerns answered on today's visit. -Patient to continue soft, supportive shoe gear daily. -Toenails 1-5  b/l were debrided in length and girth with sterile nail nippers and dremel without iatrogenic bleeding.  -Patient/POA to call should there be question/concern in the interim.   Return in about 3 months (around 04/27/2023).  Marzetta Board, DPM

## 2023-02-22 DIAGNOSIS — M25552 Pain in left hip: Secondary | ICD-10-CM | POA: Diagnosis not present

## 2023-02-28 DIAGNOSIS — M25552 Pain in left hip: Secondary | ICD-10-CM | POA: Diagnosis not present

## 2023-03-07 ENCOUNTER — Ambulatory Visit
Admission: RE | Admit: 2023-03-07 | Discharge: 2023-03-07 | Disposition: A | Discharge status: Home / Self Care | Payer: Medicare HMO | Source: Ambulatory Visit | Attending: Internal Medicine | Admitting: Internal Medicine

## 2023-03-07 DIAGNOSIS — Z1231 Encounter for screening mammogram for malignant neoplasm of breast: Secondary | ICD-10-CM | POA: Diagnosis not present

## 2023-03-22 ENCOUNTER — Telehealth: Payer: Self-pay | Admitting: Internal Medicine

## 2023-03-22 DIAGNOSIS — D229 Melanocytic nevi, unspecified: Secondary | ICD-10-CM

## 2023-03-22 NOTE — Telephone Encounter (Signed)
Pt has skin tags and moles.  As per Pt, these skin tags and moles are growing and changing shape.   Pt is requesting a referral - Preferably to:  [facility] says Referral needs to first be sent to:   [facility] Clinical Documents with a referral request  Fax:  [phone]   Or online [email or web address]

## 2023-03-23 DIAGNOSIS — H938X3 Other specified disorders of ear, bilateral: Secondary | ICD-10-CM | POA: Diagnosis not present

## 2023-03-23 DIAGNOSIS — K1120 Sialoadenitis, unspecified: Secondary | ICD-10-CM | POA: Diagnosis not present

## 2023-03-23 NOTE — Telephone Encounter (Signed)
Attempted to reach pt. Left a detail message and to call us back if have any questions.

## 2023-03-23 NOTE — Telephone Encounter (Signed)
A referral is placed 

## 2023-03-23 NOTE — Telephone Encounter (Signed)
Ok to refer.

## 2023-04-05 NOTE — Telephone Encounter (Signed)
Pt states she contacted [facility] and was informed they do not have a referral for her.  Please advise.

## 2023-04-11 ENCOUNTER — Encounter: Payer: Self-pay | Admitting: Internal Medicine

## 2023-05-01 DIAGNOSIS — H04123 Dry eye syndrome of bilateral lacrimal glands: Secondary | ICD-10-CM | POA: Diagnosis not present

## 2023-05-01 DIAGNOSIS — H40033 Anatomical narrow angle, bilateral: Secondary | ICD-10-CM | POA: Diagnosis not present

## 2023-05-01 DIAGNOSIS — H40023 Open angle with borderline findings, high risk, bilateral: Secondary | ICD-10-CM | POA: Diagnosis not present

## 2023-06-06 ENCOUNTER — Ambulatory Visit: Payer: Medicare HMO | Admitting: Podiatry

## 2023-06-06 DIAGNOSIS — M79674 Pain in right toe(s): Secondary | ICD-10-CM | POA: Diagnosis not present

## 2023-06-06 DIAGNOSIS — M79675 Pain in left toe(s): Secondary | ICD-10-CM | POA: Diagnosis not present

## 2023-06-06 DIAGNOSIS — B351 Tinea unguium: Secondary | ICD-10-CM | POA: Diagnosis not present

## 2023-06-11 ENCOUNTER — Encounter: Payer: Self-pay | Admitting: Podiatry

## 2023-06-11 NOTE — Progress Notes (Unsigned)
  Subjective:  Patient ID: Debra Woodward, female    DOB: 12/16/1950,  MRN: [id number]  72 y.o. female presents to clinic with  Chief Complaint  Patient presents with   NAIL CARE    RFC   painful thick toenails that are difficult to trim. Pain interferes with ambulation. Aggravating factors include wearing enclosed shoe gear. Pain is relieved with periodic professional debridement.  New problem(s): None   PCP is Panosh, Neta Mends, MD.  Allergies  Allergen Reactions   Bee Venom Hives   Prednisone Other (See Comments)    hallucinations    Review of Systems: Negative except as noted in the HPI.   Objective:  Debra Woodward is a pleasant 72 y.o. female WD, WN in NAD.Marland Kitchen  Vascular Examination: Vascular status intact b/l with palpable pedal pulses. CFT immediate b/l. No edema. No pain with calf compression b/l. Skin temperature gradient WNL b/l. No cyanosis or clubbing noted b/l LE.  Neurological Examination: Sensation grossly intact b/l with 10 gram monofilament. Vibratory sensation intact b/l.   Dermatological Examination: Pedal skin with normal turgor, texture and tone b/l. Toenails 1-5 b/l thick, discolored, elongated with subungual debris and pain on dorsal palpation. No hyperkeratotic lesions noted b/l.   Musculoskeletal Examination: Muscle strength 5/5 to b/l LE. {[id number]:23600}  Radiographs: None  Last A1c:      Latest Ref Rng & Units 10/10/2022   11:00 AM  Hemoglobin A1C  Hemoglobin-A1c 4.6 - 6.5 % 6.6      Assessment:   1. Pain due to onychomycosis of toenails of both feet     Plan:  {[id number]::"-Patient/POA to call should there be question/concern in the interim."}  Return in about 3 months (around 09/06/2023).  Debra Woodward, DPM

## 2023-06-14 ENCOUNTER — Encounter (INDEPENDENT_AMBULATORY_CARE_PROVIDER_SITE_OTHER): Payer: Self-pay

## 2023-06-21 ENCOUNTER — Encounter: Payer: Self-pay | Admitting: Nurse Practitioner

## 2023-06-21 ENCOUNTER — Ambulatory Visit: Payer: Medicare HMO | Admitting: Nurse Practitioner

## 2023-06-21 VITALS — BP 136/84 | HR 88 | Wt 192.0 lb

## 2023-06-21 DIAGNOSIS — Z113 Encounter for screening for infections with a predominantly sexual mode of transmission: Secondary | ICD-10-CM | POA: Diagnosis not present

## 2023-06-21 DIAGNOSIS — N898 Other specified noninflammatory disorders of vagina: Secondary | ICD-10-CM | POA: Diagnosis not present

## 2023-06-21 LAB — WET PREP FOR TRICH, YEAST, CLUE

## 2023-06-21 NOTE — Progress Notes (Signed)
   Acute Office Visit  Subjective:    Patient ID: Debra Woodward, female    DOB: 05-07-1951, 72 y.o.   MRN: [id number]   HPI 72 y.o. presents today for vaginal discharge and itching x 1 week. In wet bathing suits recently and used different detergent. Unsure if she is worrying herself or if she has an infection. Would like STD screening today.  No LMP recorded. Patient has had a hysterectomy.    Review of Systems  Constitutional: Negative.   Genitourinary:  Positive for vaginal discharge and vaginal pain (External itching).       Objective:    Physical Exam Constitutional:      Appearance: Normal appearance.  Genitourinary:    General: Normal vulva.     Vagina: Vaginal discharge present. No erythema.     BP 136/84   Pulse 88   Wt 192 lb (87.1 kg)   SpO2 100%   BMI 33.74 kg/m  Wt Readings from Last 3 Encounters:  06/21/23 192 lb (87.1 kg)  10/10/22 189 lb 3.2 oz (85.8 kg)  07/04/22 191 lb 3.2 oz (86.7 kg)        Patient informed chaperone available to be present for breast and/or pelvic exam. Patient has requested no chaperone to be present. Patient has been advised what will be completed during breast and pelvic exam.   Wet prep negative for pathogens  Assessment & Plan:   Problem List Items Addressed This Visit   None Visit Diagnoses     Vaginal discharge    -  Primary   Relevant Orders   WET PREP FOR [place], YEAST, CLUE   Screening examination for STD (sexually transmitted disease)       Relevant Orders   C. trachomatis/N. gonorrhoeae RNA      Plan: Wet prep negative. GC/CT pending.      Olivia Mackie DNP, 9:08 AM 06/21/2023

## 2023-06-22 DIAGNOSIS — L821 Other seborrheic keratosis: Secondary | ICD-10-CM | POA: Diagnosis not present

## 2023-07-06 ENCOUNTER — Telehealth (INDEPENDENT_AMBULATORY_CARE_PROVIDER_SITE_OTHER): Payer: Medicare HMO | Admitting: Family Medicine

## 2023-07-06 ENCOUNTER — Encounter: Payer: Self-pay | Admitting: Family Medicine

## 2023-07-06 VITALS — BP 130/89 | HR 76 | Ht 63.25 in | Wt 192.0 lb

## 2023-07-06 DIAGNOSIS — Z0000 Encounter for general adult medical examination without abnormal findings: Secondary | ICD-10-CM | POA: Diagnosis not present

## 2023-07-06 NOTE — Progress Notes (Signed)
PATIENT CHECK-IN and HEALTH RISK ASSESSMENT QUESTIONNAIRE:  -completed by phone/video for upcoming Medicare Preventive Visit  Pre-Visit Check-in: 1)Vitals (height, wt, BP, etc) - record in vitals section for visit on day of visit Request home vitals (wt, BP, etc.) and enter into vitals, THEN update Vital Signs SmartPhrase below at the top of the HPI. See below.  2)Review and Update Medications, Allergies PMH, Surgeries, Social history in Epic 3)Hospitalizations in the last year with date/reason? No  4)Review and Update Care Team (patient's specialists) in Epic 5) Complete PHQ9 in Epic  6) Complete Fall Screening in Epic 7)Review all Health Maintenance Due and order under PCP if not done.  8)Medicare Wellness Questionnaire: Answer theses question about your habits: Do you drink alcohol? No If yes, how many drinks do you have a day? Have you ever smoked? Yes    How many packs a day do/did you smoke? 6 per day, she wants to quit but is concerned about weight Do you use smokeless tobacco?No Do you use an illicit drugs?No Do you exercises? Yes IF so, what type and how many days/minutes per week?2 days per week for 30 minutes Are you sexually active? Yes Number of partners?1 Typical breakfast: Varies  Typical lunch: Varies Typical dinner: Salad, [place] burger, Vegetables Typical snacks: Fruit, Cheese and crackers, Chips  Beverages:  Water, juice  Answer theses question about you: Can you perform most household chores? Yes Do you find it hard to follow a conversation in a noisy room? No Do you often ask people to speak up or repeat themselves? No Do you feel that you have a problem with memory? No Do you balance your checkbook and or bank acounts? Yes Do you feel safe at home? Yes  Last dentist visit? 1 month ago  Do you need assistance with any of the following: Please note if so No  Driving?  Feeding yourself?  Getting from bed to chair?  Getting to the toilet?  Bathing or  showering?  Dressing yourself?  Managing money?  Climbing a flight of stairs  Preparing meals?  Do you have Advanced Directives in place (Living Will, Healthcare Power or Attorney)? Yes   Last eye Exam and location? 3 months ago, [facility]   Do you currently use prescribed or non-prescribed narcotic or opioid pain medications? No  Do you have a history or close family history of breast, ovarian, tubal or peritoneal cancer or a family member with BRCA (breast cancer susceptibility 1 and 2) gene mutations? Unknown   Request home vitals (wt, BP, etc.) and enter into vitals, THEN update Vital Signs SmartPhrase below at the top of the HPI. See below.   Nurse/Assistant Credentials/time stamp: MG 4:30 PM   ----------------------------------------------------------------------------------------------------------------------------------------------------------------------------------------------------------------------  Vital Signs: Vital signs are patient reported.   MEDICARE ANNUAL PREVENTIVE VISIT WITH PROVIDER: (Welcome to Medicare, initial annual wellness or annual wellness exam)  Virtual Visit via Video Note:  I connected with Dennard Schaumann on 07/06/23 by and verified that I am speaking with the correct person using two identifiers.  Location patient: home Location provider:work or home office Persons participating in the virtual visit: patient, provider  Concerns and/or follow up today: no concerns   See HM section in Epic for other details of completed HM.    ROS: negative for report of fevers, unintentional weight loss, vision changes, vision loss, hearing loss or change, chest pain, sob, hemoptysis, melena, hematochezia, hematuria, falls, bleeding or bruising, thoughts of suicide or self harm, memory loss  Patient-completed extensive  health risk assessment - reviewed and discussed with the patient: See Health Risk Assessment completed with patient prior to the visit  either above or in recent phone note. This was reviewed in detailed with the patient today and appropriate recommendations, orders and referrals were placed as needed per Summary below and patient instructions.   Review of Medical History: -PMH, PSH, Family History and current specialty and care providers reviewed and updated and listed below   Patient Care Team: Panosh, Neta Mends, MD as PCP - General (Internal Medicine) Harrington Challenger, NP (Inactive) as Nurse Practitioner (Obstetrics and Gynecology) Verner Chol, [facility] (Inactive) as Pharmacist (Pharmacist)   Past Medical History:  Diagnosis Date   ALLERGIC RHINITIS 09/14/2006   Allergy    Fibroid    Hx of adenomatous colonic polyps 07/28/2015   Seasonal allergies     Past Surgical History:  Procedure Laterality Date   ABDOMINAL HYSTERECTOMY  1996   BREAST EXCISIONAL BIOPSY Left 1983   No scar seen   BREAST EXCISIONAL BIOPSY Right 1987   NO scar seen   BREAST SURGERY Bilateral 1983, 1996   benign   CARPAL TUNNEL RELEASE Right 2008   COLONOSCOPY  07/23/2015   CYSTECTOMY Bilateral 1983, 1988   breast; benign   KNEE ARTHROSCOPY Right 2010   TOE SURGERY Bilateral    TOTAL KNEE ARTHROPLASTY Right 2011    Social History   Socioeconomic History   Marital status: Single    Spouse name: Not on file   Number of children: Not on file   Years of education: Not on file   Highest education level: Bachelor's degree (e.g., BA, AB, BS)  Occupational History   Not on file  Tobacco Use   Smoking status: Every Day    Current packs/day: 0.25    Types: Cigarettes   Smokeless tobacco: Never  Vaping Use   Vaping status: Never Used  Substance and Sexual Activity   Alcohol use: Not Currently    Comment: social   Drug use: No   Sexual activity: Yes    Partners: Male    Birth control/protection: Surgical    Comment: HYST  Other Topics Concern   Not on file  Social History Narrative   Going to [employer] [facility]   No pets        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    Social Determinants of Health   Financial Resource Strain: Low Risk  (07/06/2023)   Overall Financial Resource Strain (CARDIA)    Difficulty of Paying Living Expenses: Not hard at all  Food Insecurity: No Food Insecurity (07/06/2023)   Hunger Vital Sign    Worried About Running Out of Food in the Last Year: Never true    Ran Out of Food in the Last Year: Never true  Transportation Needs: No Transportation Needs (07/06/2023)   PRAPARE - Administrator, Civil Service (Medical): No    Lack of Transportation (Non-Medical): No  Physical Activity: Insufficiently Active (07/06/2023)   Exercise Vital Sign    Days of Exercise per Week: 2 days    Minutes of Exercise per Session: 30 min  Stress: No Stress Concern Present (07/06/2023)   [employer] of Occupational Health - Occupational Stress Questionnaire    Feeling of Stress : Not at all  Social Connections: Moderately Integrated (07/06/2023)   Social Connection and Isolation Panel [NHANES]    Frequency of Communication with Friends and Family:  More than three times a week    Frequency of Social Gatherings with Friends and Family: Twice a week    Attends Religious Services: More than 4 times per year    Active Member of [employer] or Organizations: Yes    Attends Engineer, structural: More than 4 times per year    Marital Status: Divorced  Intimate Partner Violence: Not At Risk (07/06/2023)   Humiliation, Afraid, Rape, and Kick questionnaire    Fear of Current or Ex-Partner: No    Emotionally Abused: No    Physically Abused: No    Sexually Abused: No    Family History  Problem Relation Age of Onset   Stroke Mother    Diabetes Mother    Diabetes Father    Hypertension Sister    Cancer Sister    Esophageal cancer Paternal Grandfather 76    Current Outpatient Medications on File Prior to Visit  Medication Sig Dispense Refill   albuterol (VENTOLIN HFA) 108 (90 Base)  MCG/ACT inhaler Inhale 1-2 puffs into the lungs every 6 (six) hours as needed for wheezing or shortness of breath. 3 each 1   amLODipine (NORVASC) 5 MG tablet TAKE 1 TABLET EVERY DAY 90 tablet 3   Ascorbic Acid (VITAMIN C) 500 MG CAPS Take 1 capsule by mouth daily.     aspirin 81 MG EC tablet 1 by mouth daily or 3 d per week. 30 tablet 12   cetirizine (ZYRTEC) 10 MG tablet Take 10 mg by mouth daily. Patient alternates with Allegra     clindamycin (CLEOCIN) 300 MG capsule Take 300 mg by mouth 3 (three) times daily.     desonide (DESOWEN) 0.05 % cream as needed.     diclofenac Sodium (VOLTAREN) 1 % GEL as needed.     docusate sodium (COLACE) 100 MG capsule Take 1 capsule (100 mg total) by mouth every 12 (twelve) hours. 30 capsule 0   EPINEPHrine (EPIPEN 2-PAK) 0.3 mg/0.3 mL IJ SOAJ injection Inject 0.3 mg into the muscle as needed for anaphylaxis. 1 each 1   fexofenadine (ALLEGRA) 60 MG tablet Take 60 mg by mouth daily. Patient alternates with Zyrtec     fluticasone (FLONASE) 50 MCG/ACT nasal spray USE 2 SPRAY(S) IN EACH NOSTRIL ONCE DAILY AT  NIGHT 16 g 6   Glycerin-Polysorbate 80 (REFRESH DRY EYE THERAPY OP) Apply to eye.     Multiple Vitamin (MULTIVITAMIN) tablet Take 1 tablet by mouth daily.     ondansetron (ZOFRAN) 4 MG tablet Take 1 tablet by mouth every 8 (eight) hours as needed.     triamcinolone ointment (KENALOG) 0.1 % Apply small amt twice per day x 14 days then twice weekly 30 g 1   VITAMIN D PO Take 5,000 Int'l Units by mouth every other day.     No current facility-administered medications on file prior to visit.    Allergies  Allergen Reactions   Bee Venom Hives   Prednisone Other (See Comments)    hallucinations       Physical Exam Vitals requested from patient and listed below if patient had equipment and was able to obtain at home for this virtual visit: Vitals:   07/06/23 1610  BP: 130/89  Pulse: 76   Estimated body mass index is 33.74 kg/m as calculated from  the following:   Height as of this encounter: 5' 3.25" (1.607 m).   Weight as of this encounter: 192 lb (87.1 kg).  EKG (optional): deferred due to virtual visit  GENERAL: alert, oriented, no acute distress detected, full vision exam deferred due to pandemic and/or virtual encounter  HEENT: atraumatic, conjunttiva clear, no obvious abnormalities on inspection of external nose and ears  NECK: normal movements of the head and neck  LUNGS: on inspection no signs of respiratory distress, breathing rate appears normal, no obvious gross SOB, gasping or wheezing  CV: no obvious cyanosis  MS: moves all visible extremities without noticeable abnormality  PSYCH/NEURO: pleasant and cooperative, no obvious depression or anxiety, speech and thought processing grossly intact, Cognitive function grossly intact  Flowsheet Row Video Visit from 07/06/2023 in [facility] [place]  PHQ-9 Total Score 0           07/06/2023    4:12 PM 10/10/2022   10:12 AM 07/04/2022    9:36 AM 04/04/2022    3:40 PM 12/29/2021   11:32 AM  Depression screen PHQ 2/9  Decreased Interest 0 1 0 0 0  Down, Depressed, Hopeless 0 1 0 0 0  PHQ - 2 Score 0 2 0 0 0  Altered sleeping 0 2  1 1   Tired, decreased energy 0 1  1 1   Change in appetite 0 2  1 1   Feeling bad or failure about yourself  0 1  1 1   Trouble concentrating 0 1  1 1   Moving slowly or fidgety/restless 0 1  0 1  Suicidal thoughts 0 0  0 0  PHQ-9 Score 0 10  5 6   Difficult doing work/chores Not difficult at all Somewhat difficult  Not difficult at all        04/04/2022    3:40 PM 06/29/2022    1:10 PM 07/04/2022    9:39 AM 10/10/2022   10:13 AM 07/06/2023    4:15 PM  Fall Risk  Falls in the past year?  0 0 1 0  Was there an injury with Fall? 0 0 0 0 0  Fall Risk Category Calculator  0 0 2 0  Fall Risk Category (Retired)  Low Low Moderate   (RETIRED) Patient Fall Risk Level Low fall risk  Low fall risk Moderate fall risk    Patient at Risk for Falls Due to History of fall(s)  No Fall Risks Other (Comment) No Fall Risks  Fall risk Follow up Falls evaluation completed   Falls evaluation completed Falls evaluation completed     SUMMARY AND PLAN:  Encounter for Medicare annual wellness exam    Discussed applicable health maintenance/preventive health measures and advised and referred or ordered per patient preferences: -discussed vaccines due and she plans to get at pharmacy -discussed colonoscopy coming due this December and she plans to call GI, number provided  Health Maintenance  Topic Date Due   COVID-19 Vaccine (7 - 2023-24 season) 11/27/2022   INFLUENZA VACCINE  06/15/2023   Colonoscopy  10/16/2023   MAMMOGRAM  03/06/2024   Medicare Annual Wellness (AWV)  07/05/2024   DTaP/Tdap/Td (3 - Td or Tdap) 01/26/2028   Pneumonia Vaccine 46+ Years old  Completed   DEXA SCAN  Completed   Hepatitis C Screening  Completed   Zoster Vaccines- Shingrix  Completed   HPV VACCINES  Aged [employer] and counseling on the following was provided based on the above review of health and a plan/checklist for the patient, along with additional information discussed, was provided for the patient in the patient instructions :  -Provided counseling and plan for increased risk of falling if applicable  per above screening. Reviewed and demonstrated safe balance exercises that can be done at home to improve balance and discussed exercise guidelines for adults with include balance exercises at least 3 days per week.  -Advised and counseled on a healthy lifestyle - including the importance of a healthy diet, regular physical activity, social connections and stress management. -Reviewed patient's current diet. Advised and counseled on a whole foods based healthy diet. A summary of a healthy diet was provided in the Patient Instructions.  -reviewed patient's current physical activity level and discussed exercise guidelines for  adults. Discussed community resources and ideas for safe exercise at home to assist in meeting exercise guideline recommendations in a safe and healthy way.  -Advise yearly dental visits at minimum and regular eye exams -Advised and counseled on  risks/ tobacco use, risks of smoking and offered counseling/help, discussed her concern of weight ans smoking and foods to help balance metabolism. Discussed nicotine gum/lozenge for cravings. She seems to be considering. Quit line info in patient instructions.   Follow up: see patient instructions     Patient Instructions  I really enjoyed getting to talk with you today! I am available on Tuesdays and Thursdays for virtual visits if you have any questions or concerns, or if I can be of any further assistance.   CHECKLIST FROM ANNUAL WELLNESS VISIT:  -Follow up (please call to schedule if not scheduled after visit):   -yearly for annual wellness visit with primary care office  Here is a list of your preventive care/health maintenance measures and the plan for each if any are due:  PLAN For any measures below that may be due:  -can get the vaccines at the pharmacy  Health Maintenance  Topic Date Due   COVID-19 Vaccine (7 - 2023-24 season) 11/27/2022   INFLUENZA VACCINE  06/15/2023   Colonoscopy  10/16/2023   MAMMOGRAM  03/06/2024   Medicare Annual Wellness (AWV)  07/05/2024   DTaP/Tdap/Td (3 - Td or Tdap) 01/26/2028   Pneumonia Vaccine 44+ Years old  Completed   DEXA SCAN  Completed   Hepatitis C Screening  Completed   Zoster Vaccines- Shingrix  Completed   HPV VACCINES  Aged Out    -See a dentist at least yearly  -Get your eyes checked and then per your eye specialist's recommendations  -Other issues addressed today:  Smoking can use nicotine gum or lozenge for cravings as we discussed. Can call 1-800-Quit Now for help. Also, [place] has some programs to assist in quitting smoking.   -I have included below further information  regarding a healthy whole foods based diet, physical activity guidelines for adults, stress management and opportunities for social connections. I hope you find this information useful.   -----------------------------------------------------------------------------------------------------------------------------------------------------------------------------------------------------------------------------------------------------------  NUTRITION: -eat real food: lots of colorful vegetables (half the plate) and fruits -5-7 servings of vegetables and fruits per day (fresh or steamed is best), exp. 2 servings of vegetables with lunch and dinner and 2 servings of fruit per day. Berries and greens such as kale and collards are great choices.  -consume on a regular basis: whole grains (make sure first ingredient on label contains the word "whole"), fresh fruits, fish, nuts, seeds, healthy oils (such as olive oil, avocado oil, grape seed oil) -may eat small amounts of dairy and lean meat on occasion, but avoid processed meats such as ham, bacon, lunch meat, etc. -drink water -try to avoid fast food and pre-packaged foods, processed meat -most experts advise limiting sodium to < 2300mg   per day, should limit further is any chronic conditions such as high blood pressure, heart disease, diabetes, etc. The American Heart Association advised that < 1500mg  is is ideal -try to avoid foods that contain any ingredients with names you do not recognize  -try to avoid sugar/sweets (except for the natural sugar that occurs in fresh fruit) -try to avoid sweet drinks -try to avoid white rice, white bread, pasta (unless whole grain), white or yellow potatoes  EXERCISE GUIDELINES FOR ADULTS: -if you wish to increase your physical activity, do so gradually and with the approval of your doctor -STOP and seek medical care immediately if you have any chest pain, chest discomfort or trouble breathing when starting or  increasing exercise  -move and stretch your body, legs, feet and arms when sitting for long periods -Physical activity guidelines for optimal health in adults: -least 150 minutes per week of aerobic exercise (can talk, but not sing) once approved by your doctor, 20-30 minutes of sustained activity or two 10 minute episodes of sustained activity every day.  -resistance training at least 2 days per week if approved by your doctor -balance exercises 3+ days per week:   Stand somewhere where you have something sturdy to hold onto if you lose balance.    1) lift up on toes, start with 5x per day and work up to 20x   2) stand and lift on leg straight out to the side so that foot is a few inches of the floor, start with 5x each side and work up to 20x each side   3) stand on one foot, start with 5 seconds each side and work up to 20 seconds on each side  If you need ideas or help with getting more active:  -Silver sneakers https://tools.silversneakers.com  -Walk with a Doc: http://www.duncan-williams.com/  -try to include resistance (weight lifting/strength building) and balance exercises twice per week: or the following link for ideas: http://castillo-powell.com/  BuyDucts.dk  STRESS MANAGEMENT: -can try meditating, or just sitting quietly with deep breathing while intentionally relaxing all parts of your body for 5 minutes daily -if you need further help with stress, anxiety or depression please follow up with your primary doctor or contact the wonderful folks at [facility]: [phone]  SOCIAL CONNECTIONS: -options in [place] if you wish to engage in more social and exercise related activities:  -Silver sneakers https://tools.silversneakers.com  -Walk with a Doc: http://www.duncan-williams.com/  -Check out the [facility] Active Adults 50+ section on the [place] of [employer] (hiking  clubs, book clubs, cards and games, chess, exercise classes, aquatic classes and much more) - see the website for details: https://www.[place].gov/departments/parks-recreation/active-adults50  -YouTube has lots of exercise videos for different ages and abilities as well  -[facility] (a variety of indoor and outdoor inperson activities for adults). 6202978218. [address]  -Virtual Online Classes (a variety of topics): see seniorplanet.org or call [phone]  -consider volunteering at a school, hospice center, church, senior center or elsewhere           Terressa Koyanagi, DO

## 2023-07-06 NOTE — Patient Instructions (Addendum)
I really enjoyed getting to talk with you today! I am available on Tuesdays and Thursdays for virtual visits if you have any questions or concerns, or if I can be of any further assistance.   CHECKLIST FROM ANNUAL WELLNESS VISIT:  -Follow up (please call to schedule if not scheduled after visit):   -yearly for annual wellness visit with primary care office  Here is a list of your preventive care/health maintenance measures and the plan for each if any are due:  PLAN For any measures below that may be due:  -can get the vaccines at the pharmacy  Health Maintenance  Topic Date Due   COVID-19 Vaccine (7 - 2023-24 season) 11/27/2022   INFLUENZA VACCINE  06/15/2023   Colonoscopy  10/16/2023   MAMMOGRAM  03/06/2024   Medicare Annual Wellness (AWV)  07/05/2024   DTaP/Tdap/Td (3 - Td or Tdap) 01/26/2028   Pneumonia Vaccine 44+ Years old  Completed   DEXA SCAN  Completed   Hepatitis C Screening  Completed   Zoster Vaccines- Shingrix  Completed   HPV VACCINES  Aged Out    -See a dentist at least yearly  -Get your eyes checked and then per your eye specialist's recommendations  -Other issues addressed today:  Smoking can use nicotine gum or lozenge for cravings as we discussed. Can call 1-800-Quit Now for help. Also, [place] has some programs to assist in quitting smoking.   -I have included below further information regarding a healthy whole foods based diet, physical activity guidelines for adults, stress management and opportunities for social connections. I hope you find this information useful.   -----------------------------------------------------------------------------------------------------------------------------------------------------------------------------------------------------------------------------------------------------------  NUTRITION: -eat real food: lots of colorful vegetables (half the plate) and fruits -5-7 servings of vegetables and fruits per day  (fresh or steamed is best), exp. 2 servings of vegetables with lunch and dinner and 2 servings of fruit per day. Berries and greens such as kale and collards are great choices.  -consume on a regular basis: whole grains (make sure first ingredient on label contains the word "whole"), fresh fruits, fish, nuts, seeds, healthy oils (such as olive oil, avocado oil, grape seed oil) -may eat small amounts of dairy and lean meat on occasion, but avoid processed meats such as ham, bacon, lunch meat, etc. -drink water -try to avoid fast food and pre-packaged foods, processed meat -most experts advise limiting sodium to < 2300mg  per day, should limit further is any chronic conditions such as high blood pressure, heart disease, diabetes, etc. The American Heart Association advised that < 1500mg  is is ideal -try to avoid foods that contain any ingredients with names you do not recognize  -try to avoid sugar/sweets (except for the natural sugar that occurs in fresh fruit) -try to avoid sweet drinks -try to avoid white rice, white bread, pasta (unless whole grain), white or yellow potatoes  EXERCISE GUIDELINES FOR ADULTS: -if you wish to increase your physical activity, do so gradually and with the approval of your doctor -STOP and seek medical care immediately if you have any chest pain, chest discomfort or trouble breathing when starting or increasing exercise  -move and stretch your body, legs, feet and arms when sitting for long periods -Physical activity guidelines for optimal health in adults: -least 150 minutes per week of aerobic exercise (can talk, but not sing) once approved by your doctor, 20-30 minutes of sustained activity or two 10 minute episodes of sustained activity every day.  -resistance training at least 2 days per week if  approved by your doctor -balance exercises 3+ days per week:   Stand somewhere where you have something sturdy to hold onto if you lose balance.    1) lift up on toes,  start with 5x per day and work up to 20x   2) stand and lift on leg straight out to the side so that foot is a few inches of the floor, start with 5x each side and work up to 20x each side   3) stand on one foot, start with 5 seconds each side and work up to 20 seconds on each side  If you need ideas or help with getting more active:  -Silver sneakers https://tools.silversneakers.com  -Walk with a Doc: http://www.duncan-williams.com/  -try to include resistance (weight lifting/strength building) and balance exercises twice per week: or the following link for ideas: http://castillo-powell.com/  BuyDucts.dk  STRESS MANAGEMENT: -can try meditating, or just sitting quietly with deep breathing while intentionally relaxing all parts of your body for 5 minutes daily -if you need further help with stress, anxiety or depression please follow up with your primary doctor or contact the wonderful folks at [facility]: [phone]  SOCIAL CONNECTIONS: -options in [place] if you wish to engage in more social and exercise related activities:  -Silver sneakers https://tools.silversneakers.com  -Walk with a Doc: http://www.duncan-williams.com/  -Check out the [facility] Active Adults 50+ section on the [place] [employer] (hiking clubs, book clubs, cards and games, chess, exercise classes, aquatic classes and much more) - see the website for details: https://www.[place].gov/departments/parks-recreation/active-adults50  -YouTube has lots of exercise videos for different ages and abilities as well  -[facility] (a variety of indoor and outdoor inperson activities for adults). 6703118005. [address]  -Virtual Online Classes (a variety of topics): see seniorplanet.org or call [phone]  -consider volunteering at a school, hospice center, church, senior center or  elsewhere

## 2023-07-24 DIAGNOSIS — H6123 Impacted cerumen, bilateral: Secondary | ICD-10-CM | POA: Diagnosis not present

## 2023-08-15 ENCOUNTER — Emergency Department (HOSPITAL_BASED_OUTPATIENT_CLINIC_OR_DEPARTMENT_OTHER)
Admission: EM | Admit: 2023-08-15 | Discharge: 2023-08-15 | Disposition: A | Discharge status: Home / Self Care | Payer: Medicare HMO | Attending: Emergency Medicine | Admitting: Emergency Medicine

## 2023-08-15 ENCOUNTER — Emergency Department (HOSPITAL_BASED_OUTPATIENT_CLINIC_OR_DEPARTMENT_OTHER): Payer: Medicare HMO

## 2023-08-15 ENCOUNTER — Encounter (HOSPITAL_BASED_OUTPATIENT_CLINIC_OR_DEPARTMENT_OTHER): Payer: Self-pay | Admitting: Emergency Medicine

## 2023-08-15 ENCOUNTER — Other Ambulatory Visit (HOSPITAL_BASED_OUTPATIENT_CLINIC_OR_DEPARTMENT_OTHER): Payer: Self-pay

## 2023-08-15 ENCOUNTER — Other Ambulatory Visit: Payer: Self-pay

## 2023-08-15 DIAGNOSIS — M5136 Other intervertebral disc degeneration, lumbar region with discogenic back pain only: Secondary | ICD-10-CM | POA: Diagnosis not present

## 2023-08-15 DIAGNOSIS — Z7982 Long term (current) use of aspirin: Secondary | ICD-10-CM | POA: Insufficient documentation

## 2023-08-15 DIAGNOSIS — I700 Atherosclerosis of aorta: Secondary | ICD-10-CM | POA: Diagnosis not present

## 2023-08-15 DIAGNOSIS — K7689 Other specified diseases of liver: Secondary | ICD-10-CM | POA: Diagnosis not present

## 2023-08-15 DIAGNOSIS — M549 Dorsalgia, unspecified: Secondary | ICD-10-CM | POA: Diagnosis present

## 2023-08-15 DIAGNOSIS — R1084 Generalized abdominal pain: Secondary | ICD-10-CM | POA: Insufficient documentation

## 2023-08-15 DIAGNOSIS — R1032 Left lower quadrant pain: Secondary | ICD-10-CM | POA: Diagnosis not present

## 2023-08-15 DIAGNOSIS — M545 Low back pain, unspecified: Secondary | ICD-10-CM | POA: Diagnosis not present

## 2023-08-15 LAB — CBC WITH DIFFERENTIAL/PLATELET
Abs Immature Granulocytes: 0.04 10*3/uL (ref 0.00–0.07)
Basophils Absolute: 0 10*3/uL (ref 0.0–0.1)
Basophils Relative: 0 %
Eosinophils Absolute: 0 10*3/uL (ref 0.0–0.5)
Eosinophils Relative: 0 %
HCT: 41.4 % (ref 36.0–46.0)
Hemoglobin: 14 g/dL (ref 12.0–15.0)
Immature Granulocytes: 0 %
Lymphocytes Relative: 21 %
Lymphs Abs: 1.9 10*3/uL (ref 0.7–4.0)
MCH: 32.6 pg (ref 26.0–34.0)
MCHC: 33.8 g/dL (ref 30.0–36.0)
MCV: 96.5 fL (ref 80.0–100.0)
Monocytes Absolute: 1.5 10*3/uL — ABNORMAL HIGH (ref 0.1–1.0)
Monocytes Relative: 16 %
Neutro Abs: 5.9 10*3/uL (ref 1.7–7.7)
Neutrophils Relative %: 63 %
Platelets: 401 10*3/uL — ABNORMAL HIGH (ref 150–400)
RBC: 4.29 MIL/uL (ref 3.87–5.11)
RDW: 14.1 % (ref 11.5–15.5)
WBC: 9.4 10*3/uL (ref 4.0–10.5)
nRBC: 0 % (ref 0.0–0.2)

## 2023-08-15 LAB — COMPREHENSIVE METABOLIC PANEL
ALT: 13 U/L (ref 0–44)
AST: 32 U/L (ref 15–41)
Albumin: 4.6 g/dL (ref 3.5–5.0)
Alkaline Phosphatase: 75 U/L (ref 38–126)
Anion gap: 14 (ref 5–15)
BUN: 11 mg/dL (ref 8–23)
CO2: 21 mmol/L — ABNORMAL LOW (ref 22–32)
Calcium: 10.1 mg/dL (ref 8.9–10.3)
Chloride: 105 mmol/L (ref 98–111)
Creatinine, Ser: 0.77 mg/dL (ref 0.44–1.00)
GFR, Estimated: >60 mL/min (ref >60)
Glucose, Bld: 101 mg/dL — ABNORMAL HIGH (ref 70–99)
Potassium: 4.7 mmol/L (ref 3.5–5.1)
Sodium: 140 mmol/L (ref 135–145)
Total Bilirubin: 0.9 mg/dL (ref 0.3–1.2)
Total Protein: 7.9 g/dL (ref 6.5–8.1)

## 2023-08-15 LAB — LIPASE, BLOOD: Lipase: <10 U/L — ABNORMAL LOW (ref 11–51)

## 2023-08-15 MED ORDER — IOHEXOL 300 MG/ML  SOLN
100.0000 mL | Freq: Once | INTRAMUSCULAR | Status: AC | PRN
Start: 2023-08-15 — End: 2023-08-15
  Administered 2023-08-15: 100 mL via INTRAVENOUS

## 2023-08-15 MED ORDER — KETOROLAC TROMETHAMINE 15 MG/ML IJ SOLN
15.0000 mg | Freq: Once | INTRAMUSCULAR | Status: AC
  Administered 2023-08-15: 15 mg via INTRAVENOUS
  Filled 2023-08-15: qty 1

## 2023-08-15 MED ORDER — CYCLOBENZAPRINE HCL 10 MG PO TABS: 10.0000 mg | ORAL_TABLET | Freq: Two times a day (BID) | ORAL | 0 refills | Status: DC | PRN

## 2023-08-15 MED ORDER — ONDANSETRON HCL 4 MG/2ML IJ SOLN
4.0000 mg | Freq: Once | INTRAMUSCULAR | Status: AC
  Administered 2023-08-15: 4 mg via INTRAVENOUS
  Filled 2023-08-15: qty 2

## 2023-08-15 MED ORDER — CYCLOBENZAPRINE HCL 5 MG PO TABS
5.0000 mg | ORAL_TABLET | Freq: Once | ORAL | Status: AC
  Administered 2023-08-15: 5 mg via ORAL
  Filled 2023-08-15: qty 1

## 2023-08-15 MED ORDER — MORPHINE SULFATE (PF) 4 MG/ML IV SOLN
4.0000 mg | Freq: Once | INTRAVENOUS | Status: AC
  Administered 2023-08-15: 4 mg via INTRAVENOUS
  Filled 2023-08-15: qty 1

## 2023-08-15 MED ORDER — HYDROMORPHONE HCL 1 MG/ML IJ SOLN
1.0000 mg | Freq: Once | INTRAMUSCULAR | Status: DC
  Filled 2023-08-15: qty 1

## 2023-08-15 MED ORDER — DICYCLOMINE HCL 10 MG PO CAPS
10.0000 mg | ORAL_CAPSULE | Freq: Once | ORAL | Status: DC
  Filled 2023-08-15: qty 1

## 2023-08-15 MED ORDER — LIDOCAINE 5 % EX PTCH
1.0000 | MEDICATED_PATCH | CUTANEOUS | Status: DC
  Filled 2023-08-15: qty 1

## 2023-08-15 MED ORDER — LIDOCAINE 5 % EX PTCH: 1.0000 | MEDICATED_PATCH | CUTANEOUS | 0 refills | Status: AC

## 2023-08-15 NOTE — Discharge Instructions (Addendum)
CT scan was overall reassuring without any evidence of acute abnormality.  There was an incidental finding of a right sided ovarian cyst that was close to 5 cm in size.  Your pain was not in this vicinity so this is likely an incidental finding.  Please follow-up with your primary care provider for a repeat assessment within the next week.  I have prescribed Flexeril and pain medicine for you to take outpatient.  Take Tylenol and ibuprofen for pain control as well.

## 2023-08-15 NOTE — ED Notes (Signed)
Unsuccessful IV attempt, LT AC, LT hand. Labs collected. Pt tolerated well

## 2023-08-15 NOTE — ED Triage Notes (Signed)
Pt c/o LT side lower back pain x 3 days that "feels like a gas bubble". Took miralax last night. Denies n/v. Last bm today at 1220

## 2023-08-15 NOTE — ED Provider Notes (Signed)
[place] EMERGENCY DEPARTMENT AT [facility] Provider Note   CSN: [id number] Arrival date & time: 08/15/23  1257     History  Chief Complaint  Patient presents with   Back Pain    Debra Woodward is a 72 y.o. female.  This is a 72 year old female who presents emergency department today due to abdominal pain that has been ongoing for 48 hours.  Per the patient, she says that she started having pain on the left side of her abdomen beginning 2 days ago.  She felt as though she was constipated, and had a difficult time passing gas.  Says she had a bowel movement last night, but she continues to have pain.   Back Pain      Home Medications Prior to Admission medications   Medication Sig Start Date End Date Taking? Authorizing Provider  albuterol (VENTOLIN HFA) 108 (90 Base) MCG/ACT inhaler Inhale 1-2 puffs into the lungs every 6 (six) hours as needed for wheezing or shortness of breath. 10/10/22   Panosh, Neta Mends, MD  amLODipine (NORVASC) 5 MG tablet TAKE 1 TABLET EVERY DAY 01/23/23   Worthy Rancher B, FNP  Ascorbic Acid (VITAMIN C) 500 MG CAPS Take 1 capsule by mouth daily.    [provider]  aspirin 81 MG EC tablet 1 by mouth daily or 3 d per week. 10/13/21   Panosh, Neta Mends, MD  cetirizine (ZYRTEC) 10 MG tablet Take 10 mg by mouth daily. Patient alternates with Allegra    [provider]  clindamycin (CLEOCIN) 300 MG capsule Take 300 mg by mouth 3 (three) times daily. 07/26/22   [provider]  desonide (DESOWEN) 0.05 % cream as needed.    [provider]  diclofenac Sodium (VOLTAREN) 1 % GEL as needed.    [provider]  docusate sodium (COLACE) 100 MG capsule Take 1 capsule (100 mg total) by mouth every 12 (twelve) hours. 06/26/19   Khatri, Hina, PA-C  EPINEPHrine (EPIPEN 2-PAK) 0.3 mg/0.3 mL IJ SOAJ injection Inject 0.3 mg into the muscle as needed for anaphylaxis. 04/04/22   Panosh, Neta Mends, MD  fexofenadine (ALLEGRA) 60 MG  tablet Take 60 mg by mouth daily. Patient alternates with Zyrtec    [provider]  fluticasone (FLONASE) 50 MCG/ACT nasal spray USE 2 SPRAY(S) IN [facility] NOSTRIL ONCE DAILY AT  NIGHT 01/15/21   Drema Halon, MD  Glycerin-Polysorbate 80 (REFRESH DRY EYE THERAPY OP) Apply to eye.    [provider]  Multiple Vitamin (MULTIVITAMIN) tablet Take 1 tablet by mouth daily.    [provider]  ondansetron (ZOFRAN) 4 MG tablet Take 1 tablet by mouth every 8 (eight) hours as needed.    [provider]  triamcinolone ointment (KENALOG) 0.1 % Apply small amt twice per day x 14 days then twice weekly 11/23/20   Clarita Crane, NP  VITAMIN D PO Take 5,000 Int'l Units by mouth every other day.    [provider]      Allergies    Bee venom and Prednisone    Review of Systems   Review of Systems  Musculoskeletal:  Positive for back pain.    Physical Exam Updated Vital Signs BP (!) 150/84 (BP Location: Right Arm)   Pulse 90   Temp 98.5 F (36.9 C) (Oral)   Resp 20   Ht 5\' 5"  (1.651 m)   Wt 83.9 kg   SpO2 100%   BMI 30.79 kg/m  Physical Exam Vitals reviewed.  Constitutional:      Appearance: Normal appearance. She is not ill-appearing.  HENT:     Head: Normocephalic.  Cardiovascular:     Rate and Rhythm: Normal rate.  Pulmonary:     Effort: Pulmonary effort is normal.  Abdominal:     General: Abdomen is flat. There is no distension.     Palpations: Abdomen is soft.     Tenderness: There is abdominal tenderness. There is no guarding.  Musculoskeletal:        General: Normal range of motion.  Neurological:     Mental Status: She is alert.     Gait: Gait normal.     ED Results / Procedures / Treatments   Labs (all labs ordered are listed, but only abnormal results are displayed) Labs Reviewed  CBC WITH DIFFERENTIAL/PLATELET  COMPREHENSIVE METABOLIC PANEL  LIPASE, BLOOD    EKG None  Radiology No results  found.  Procedures Procedures    Medications Ordered in ED Medications  morphine (PF) 4 MG/ML injection 4 mg (has no administration in time range)  ondansetron (ZOFRAN) injection 4 mg (has no administration in time range)    ED Course/ Medical Decision Making/ A&P                                 Medical Decision Making 72 year old female here today for abdominal pain.  Differential diagnoses include bowel obstruction, constipation, diverticulitis, less likely mesenteric ischemia, less likely aortic pathology.  Plan -patient with tender at been, concern is for obstruction, versus acute intra-abdominal process.  Will obtain imaging of the patient's abdomen pelvis.  Analgesia provided.  CT imaging ordered.  Patient was signed out to Dr. Karene Fry pending CT imaging.  Amount and/or Complexity of Data Reviewed Labs: ordered. Radiology: ordered.  Risk Prescription drug management.           Final Clinical Impression(s) / ED Diagnoses Final diagnoses:  None    Rx / DC Orders ED Discharge Orders     None         Arletha Pili, DO 08/15/23 1417

## 2023-08-15 NOTE — ED Notes (Signed)
Discharge paperwork given and verbally understood. 

## 2023-08-15 NOTE — ED Provider Notes (Signed)
  Physical Exam  BP (!) 120/97 (BP Location: Right Arm)   Pulse 79   Temp 98.8 F (37.1 C) (Oral)   Resp 16   Ht 5\' 5"  (1.651 m)   Wt 83.9 kg   SpO2 100%   BMI 30.79 kg/m     Procedures  Procedures  ED Course / MDM    Medical Decision Making Amount and/or Complexity of Data Reviewed Labs: ordered. Radiology: ordered.  Risk Prescription drug management.   4F presenting with abdominal pain, CT to eval for obstruction. If constipated, likely DC home.   CT Abd Pel: IMPRESSION:  No acute findings.    Stable 4.9 cm benign-appearing right adnexal cyst, consistent with  benign etiology.    Pt still with 8/10 pain on reassessment. Ordered Dilaudid. With a 4.9cm cyst seen on CT, considered ovarian torsion however the patient's cyst is nowhere in the vicinity of where her pain is.  I evaluated the patient bedside.  She has left-sided low back pain, worse with twisting and movement, radiating to her left buttocks.  Suspect likely musculoskeletal strain versus sciatica from degenerative disc disease or spinal stenosis.  No recent falls or trauma.  The patient denies any fevers or chills.  No urinary symptoms.  She has no saddle anesthesia, no numbness or weakness in the lower extremities and is neurologically intact on exam.  As the patient continued to complain of 8 out of 10 pain, additional medications were ordered to include Flexeril, Toradol, lidocaine patch and Dilaudid.  Additionally, the patient felt like she had some gaseous distention in her abdomen and Bentyl was ordered.  She is moving her bowels and most recently had a bowel movement with the last 2 days.  No evidence of fecal impaction, bowel obstruction or other acute abnormality noted on CT imaging.  Following Toradol and Flexeril, the patient was feeling symptomatically improved.  Suspect likely musculoskeletal neck pain as etiology of her presentation.  No red flag symptoms, no urinary or fecal incontinence, saddle  anesthesia, focal numbness or weakness.  No recent falls or trauma.  She is neurologically intact on my exam.  Symptoms are consistent with likely degenerative disc disease with associated sciatica.  CT L-spine was added on to the patient's CT abdomen pelvis.  CT L Spine WO added-on: IMPRESSION:  1. Severe multilevel degenerative changes in the lumbar spine.  2. Alignment of the lumbar spine is unchanged since prior study,  likely degenerative.  3. Diffuse vertebral sclerosis is likely degenerative.    Patient feeling symptomatically improved on reassessment, overall stable for discharge and outpatient follow-up.       Ernie Avena, MD 08/15/23 2149

## 2023-08-23 ENCOUNTER — Encounter: Payer: Self-pay | Admitting: Internal Medicine

## 2023-09-12 ENCOUNTER — Encounter: Payer: Self-pay | Admitting: Podiatry

## 2023-09-12 ENCOUNTER — Ambulatory Visit: Payer: Medicare PPO | Admitting: Podiatry

## 2023-09-12 VITALS — Ht 65.0 in | Wt 185.0 lb

## 2023-09-12 DIAGNOSIS — M79675 Pain in left toe(s): Secondary | ICD-10-CM

## 2023-09-12 DIAGNOSIS — B351 Tinea unguium: Secondary | ICD-10-CM

## 2023-09-12 DIAGNOSIS — M79674 Pain in right toe(s): Secondary | ICD-10-CM | POA: Diagnosis not present

## 2023-09-17 NOTE — Progress Notes (Signed)
  Subjective:  Patient ID: Debra Woodward, female    DOB: December 25, 1950,  MRN: [id number]  Debra Woodward presents to clinic today for: painful elongated mycotic toenails 1-5 bilaterally which are tender when wearing enclosed shoe gear. Pain is relieved with periodic professional debridement.  Chief Complaint  Patient presents with   Nail Problem    RFC, Pt is not a diabetic last office visit was last year and PCP is Dr Fabian Sharp.    PCP is Panosh, Neta Mends, MD.  Allergies  Allergen Reactions   Bee Venom Hives   Prednisone Other (See Comments)    hallucinations    Review of Systems: Negative except as noted in the HPI.  Objective: No changes noted in today's physical examination. There were no vitals filed for this visit.  Debra Woodward is a pleasant 72 y.o. female in NAD. AAO x 3.  Vascular Examination: Capillary refill time <3 seconds b/l LE. Palpable pedal pulses b/l LE. Digital hair present b/l. No pedal edema b/l. Skin temperature gradient WNL b/l. No varicosities b/l. Marland Kitchen  Dermatological Examination: Pedal skin with normal turgor, texture and tone b/l. No open wounds. No interdigital macerations b/l. Toenails 1-5 b/l thickened, discolored, dystrophic with subungual debris. There is pain on palpation to dorsal aspect of nailplates. No corns, calluses nor porokeratotic lesions noted..  Neurological Examination: Protective sensation intact with 10 gram monofilament b/l LE. Vibratory sensation intact b/l LE.   Musculoskeletal Examination: Muscle strength 5/5 to all lower extremity muscle groups bilaterally. No pain, crepitus or joint limitation noted with ROM bilateral LE. No gross bony deformities bilaterally.     Latest Ref Rng & Units 10/10/2022   11:00 AM  Hemoglobin A1C  Hemoglobin-A1c 4.6 - 6.5 % 6.6    Assessment/Plan: 1. Pain due to onychomycosis of toenails of both feet     Patient was evaluated and treated. All patient's and/or POA's questions/concerns addressed on  today's visit. Toenails 1-5 debrided in length and girth without incident. Continue soft, supportive shoe gear daily. Report any pedal injuries to medical professional. Call office if there are any questions/concerns. -Patient/POA to call should there be question/concern in the interim.   Return in about 3 months (around 12/13/2023).  Freddie Breech, DPM

## 2023-10-04 ENCOUNTER — Encounter: Payer: Self-pay | Admitting: Internal Medicine

## 2023-10-11 ENCOUNTER — Encounter: Payer: Self-pay | Admitting: Internal Medicine

## 2023-10-16 ENCOUNTER — Encounter: Payer: Medicare HMO | Admitting: Internal Medicine

## 2023-10-17 NOTE — Progress Notes (Unsigned)
No chief complaint on file.   HPI: Patient  Debra Woodward  72 y.o. comes in today for Preventive Health Care visit   HT amlodipine Ent GYNE  Health Maintenance  Topic Date Due   INFLUENZA VACCINE  06/15/2023   COVID-19 Vaccine (7 - 2023-24 season) 07/16/2023   Colonoscopy  10/16/2023   MAMMOGRAM  03/06/2024   Medicare Annual Wellness (AWV)  07/05/2024   DTaP/Tdap/Td (3 - Td or Tdap) 01/26/2028   Pneumonia Vaccine 1+ Years old  Completed   DEXA SCAN  Completed   Hepatitis C Screening  Completed   Zoster Vaccines- Shingrix  Completed   HPV VACCINES  Aged Out   Health Maintenance Review LIFESTYLE:  Exercise:   Tobacco/ETS: Alcohol:  Sugar beverages: Sleep: Drug use: no HH of  Work:    ROS:  GEN/ HEENT: No fever, significant weight changes sweats headaches vision problems hearing changes, CV/ PULM; No chest pain shortness of breath cough, syncope,edema  change in exercise tolerance. GI /GU: No adominal pain, vomiting, change in bowel habits. No blood in the stool. No significant GU symptoms. SKIN/HEME: ,no acute skin rashes suspicious lesions or bleeding. No lymphadenopathy, nodules, masses.  NEURO/ PSYCH:  No neurologic signs such as weakness numbness. No depression anxiety. IMM/ Allergy: No unusual infections.  Allergy .   REST of 12 system review negative except as per HPI   Past Medical History:  Diagnosis Date   ALLERGIC RHINITIS 09/14/2006   Allergy    Fibroid    Hx of adenomatous colonic polyps 07/28/2015   Seasonal allergies     Past Surgical History:  Procedure Laterality Date   ABDOMINAL HYSTERECTOMY  1996   BREAST EXCISIONAL BIOPSY Left 1983   No scar seen   BREAST EXCISIONAL BIOPSY Right 1987   NO scar seen   BREAST SURGERY Bilateral 1983, 1996   benign   CARPAL TUNNEL RELEASE Right 2008   COLONOSCOPY  07/23/2015   CYSTECTOMY Bilateral 1983, 1988   breast; benign   JOINT REPLACEMENT     KNEE ARTHROSCOPY Right 2010   TOE SURGERY  Bilateral    TOTAL KNEE ARTHROPLASTY Right 2011    Family History  Problem Relation Age of Onset   Stroke Mother    Diabetes Mother    Diabetes Father    Hypertension Sister    Cancer Sister    Esophageal cancer Paternal Grandfather 84    Social History   Socioeconomic History   Marital status: Single    Spouse name: Not on file   Number of children: Not on file   Years of education: Not on file   Highest education level: Bachelor's degree (e.g., BA, AB, BS)  Occupational History   Not on file  Tobacco Use   Smoking status: Every Day    Current packs/day: 0.25    Types: Cigarettes   Smokeless tobacco: Never  Vaping Use   Vaping status: Never Used  Substance and Sexual Activity   Alcohol use: Not Currently    Comment: social   Drug use: No   Sexual activity: Yes    Partners: Male    Birth control/protection: Surgical    Comment: HYST  Other Topics Concern   Not on file  Social History Narrative   Going to [facility] of 1   No pets       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    Social Determinants of Health   Financial  Resource Strain: Low Risk  (07/06/2023)   Overall Financial Resource Strain (CARDIA)    Difficulty of Paying Living Expenses: Not hard at all  Food Insecurity: No Food Insecurity (07/06/2023)   Hunger Vital Sign    Worried About Running Out of Food in the Last Year: Never true    Ran Out of Food in the Last Year: Never true  Transportation Needs: No Transportation Needs (07/06/2023)   PRAPARE - Administrator, Civil Service (Medical): No    Lack of Transportation (Non-Medical): No  Physical Activity: Insufficiently Active (07/06/2023)   Exercise Vital Sign    Days of Exercise per Week: 2 days    Minutes of Exercise per Session: 30 min  Stress: No Stress Concern Present (07/06/2023)   [employer] of Occupational Health - Occupational Stress Questionnaire    Feeling of Stress : Not at all  Social Connections:  Moderately Integrated (07/06/2023)   Social Connection and Isolation Panel [NHANES]    Frequency of Communication with Friends and Family: More than three times a week    Frequency of Social Gatherings with Friends and Family: Twice a week    Attends Religious Services: More than 4 times per year    Active Member of [employer] or Organizations: Yes    Attends Engineer, structural: More than 4 times per year    Marital Status: Divorced    Outpatient Medications Prior to Visit  Medication Sig Dispense Refill   albuterol (VENTOLIN HFA) 108 (90 Base) MCG/ACT inhaler Inhale 1-2 puffs into the lungs every 6 (six) hours as needed for wheezing or shortness of breath. 3 each 1   amLODipine (NORVASC) 5 MG tablet TAKE 1 TABLET EVERY DAY 90 tablet 3   Ascorbic Acid (VITAMIN C) 500 MG CAPS Take 1 capsule by mouth daily.     aspirin 81 MG EC tablet 1 by mouth daily or 3 d per week. 30 tablet 12   cetirizine (ZYRTEC) 10 MG tablet Take 10 mg by mouth daily. Patient alternates with Allegra     clindamycin (CLEOCIN) 300 MG capsule Take 300 mg by mouth 3 (three) times daily.     cyclobenzaprine (FLEXERIL) 10 MG tablet Take 1 tablet (10 mg total) by mouth 2 (two) times daily as needed for muscle spasms. 20 tablet 0   desonide (DESOWEN) 0.05 % cream as needed.     diclofenac Sodium (VOLTAREN) 1 % GEL as needed.     docusate sodium (COLACE) 100 MG capsule Take 1 capsule (100 mg total) by mouth every 12 (twelve) hours. 30 capsule 0   EPINEPHrine (EPIPEN 2-PAK) 0.3 mg/0.3 mL IJ SOAJ injection Inject 0.3 mg into the muscle as needed for anaphylaxis. 1 each 1   fexofenadine (ALLEGRA) 60 MG tablet Take 60 mg by mouth daily. Patient alternates with Zyrtec     fluticasone (FLONASE) 50 MCG/ACT nasal spray USE 2 SPRAY(S) IN EACH NOSTRIL ONCE DAILY AT  NIGHT 16 g 6   Glycerin-Polysorbate 80 (REFRESH DRY EYE THERAPY OP) Apply to eye.     lidocaine (LIDODERM) 5 % Place 1 patch onto the skin daily. Remove & Discard patch  within 12 hours or as directed by MD 30 patch 0   Multiple Vitamin (MULTIVITAMIN) tablet Take 1 tablet by mouth daily.     ondansetron (ZOFRAN) 4 MG tablet Take 1 tablet by mouth every 8 (eight) hours as needed.     triamcinolone ointment (KENALOG) 0.1 % Apply small amt twice per day x  14 days then twice weekly 30 g 1   VITAMIN D PO Take 5,000 Int'l Units by mouth every other day.     No facility-administered medications prior to visit.     EXAM:  There were no vitals taken for this visit.  There is no height or weight on file to calculate BMI. Wt Readings from Last 3 Encounters:  09/12/23 185 lb (83.9 kg)  08/15/23 185 lb (83.9 kg)  07/06/23 192 lb (87.1 kg)    Physical Exam: Vital signs reviewed ZOX:WRUE is a well-developed well-nourished alert cooperative    who appearsr stated age in no acute distress.  HEENT: normocephalic atraumatic , Eyes: PERRL EOM's full, conjunctiva clear, Nares: paten,t no deformity discharge or tenderness., Ears: no deformity EAC's clear TMs with normal landmarks. Mouth: clear OP, no lesions, edema.  Moist mucous membranes. Dentition in adequate repair. NECK: supple without masses, thyromegaly or bruits. CHEST/PULM:  Clear to auscultation and percussion breath sounds equal no wheeze , rales or rhonchi. No chest wall deformities or tenderness. Breast: normal by inspection . No dimpling, discharge, masses, tenderness or discharge . CV: PMI is nondisplaced, S1 S2 no gallops, murmurs, rubs. Peripheral pulses are full without delay.No JVD .  ABDOMEN: Bowel sounds normal nontender  No guard or rebound, no hepato splenomegal no CVA tenderness.  No hernia. Extremtities:  No clubbing cyanosis or edema, no acute joint swelling or redness no focal atrophy NEURO:  Oriented x3, cranial nerves 3-12 appear to be intact, no obvious focal weakness,gait within normal limits no abnormal reflexes or asymmetrical SKIN: No acute rashes normal turgor, color, no bruising or  petechiae. PSYCH: Oriented, good eye contact, no obvious depression anxiety, cognition and judgment appear normal. LN: no cervical axillary  adenopathy  Lab Results  Component Value Date   WBC 9.4 08/15/2023   HGB 14.0 08/15/2023   HCT 41.4 08/15/2023   PLT 401 (H) 08/15/2023   GLUCOSE 101 (H) 08/15/2023   CHOL 164 10/10/2022   TRIG 107.0 10/10/2022   HDL 68.40 10/10/2022   LDLCALC 75 10/10/2022   ALT 13 08/15/2023   AST 32 08/15/2023   NA 140 08/15/2023   K 4.7 08/15/2023   CL 105 08/15/2023   CREATININE 0.77 08/15/2023   BUN 11 08/15/2023   CO2 21 (L) 08/15/2023   TSH 2.49 09/22/2021   INR 0.94 09/07/2010   HGBA1C 6.6 (H) 10/10/2022    BP Readings from Last 3 Encounters:  08/15/23 (!) 120/97  07/06/23 130/89  06/21/23 136/84    Lab results reviewed with patient   ASSESSMENT AND PLAN:  Discussed the following assessment and plan:    ICD-10-CM   1. Essential hypertension  I10     2. Visit for preventive health examination  Z00.00     3. Medication management  Z79.899      No follow-ups on file.  Patient Care Team: Samvel Zinn, Neta Mends, MD as PCP - General (Internal Medicine) Harrington Challenger, NP (Inactive) as Nurse Practitioner (Obstetrics and Gynecology) Verner Chol, [facility] (Inactive) as Pharmacist (Pharmacist) There are no Patient Instructions on file for this visit.  Neta Mends. Shineka Auble M.D.

## 2023-10-18 ENCOUNTER — Encounter: Payer: Self-pay | Admitting: Internal Medicine

## 2023-10-18 ENCOUNTER — Ambulatory Visit: Payer: Medicare PPO | Admitting: Internal Medicine

## 2023-10-18 VITALS — BP 120/74 | HR 73 | Temp 97.8°F | Ht 64.0 in | Wt 189.2 lb

## 2023-10-18 DIAGNOSIS — I10 Essential (primary) hypertension: Secondary | ICD-10-CM

## 2023-10-18 DIAGNOSIS — Z79899 Other long term (current) drug therapy: Secondary | ICD-10-CM | POA: Diagnosis not present

## 2023-10-18 DIAGNOSIS — R739 Hyperglycemia, unspecified: Secondary | ICD-10-CM

## 2023-10-18 DIAGNOSIS — H35039 Hypertensive retinopathy, unspecified eye: Secondary | ICD-10-CM

## 2023-10-18 DIAGNOSIS — N83209 Unspecified ovarian cyst, unspecified side: Secondary | ICD-10-CM | POA: Diagnosis not present

## 2023-10-18 DIAGNOSIS — Z0000 Encounter for general adult medical examination without abnormal findings: Secondary | ICD-10-CM | POA: Diagnosis not present

## 2023-10-18 LAB — LIPID PANEL
Cholesterol: 155 mg/dL (ref 0–200)
HDL: 60.5 mg/dL (ref >39.00)
LDL Cholesterol: 78 mg/dL (ref 0–99)
NonHDL: 94.59
Total CHOL/HDL Ratio: 3
Triglycerides: 83 mg/dL (ref 0.0–149.0)
VLDL: 16.6 mg/dL (ref 0.0–40.0)

## 2023-10-18 LAB — BASIC METABOLIC PANEL
BUN: 9 mg/dL (ref 6–23)
CO2: 30 meq/L (ref 19–32)
Calcium: 9.5 mg/dL (ref 8.4–10.5)
Chloride: 105 meq/L (ref 96–112)
Creatinine, Ser: 0.79 mg/dL (ref 0.40–1.20)
GFR: 74.92 mL/min (ref >60.00)
Glucose, Bld: 112 mg/dL — ABNORMAL HIGH (ref 70–99)
Potassium: 4.1 meq/L (ref 3.5–5.1)
Sodium: 141 meq/L (ref 135–145)

## 2023-10-18 LAB — HEMOGLOBIN A1C: Hgb A1c MFr Bld: 6.7 % — ABNORMAL HIGH (ref 4.6–6.5)

## 2023-10-18 LAB — TSH: TSH: 2.63 u[IU]/mL (ref 0.35–5.50)

## 2023-10-18 NOTE — Patient Instructions (Addendum)
Good to see you today .Marland Kitchen Make appt with your  gyne and I will also send a referral to them about your large ovarian cyst.  Lab today .  Still advise stop tobacco   Bp is  in range  continue. Medication.

## 2023-10-23 DIAGNOSIS — H6122 Impacted cerumen, left ear: Secondary | ICD-10-CM | POA: Diagnosis not present

## 2023-10-25 ENCOUNTER — Ambulatory Visit (INDEPENDENT_AMBULATORY_CARE_PROVIDER_SITE_OTHER): Payer: Medicare PPO | Admitting: Nurse Practitioner

## 2023-10-25 ENCOUNTER — Encounter: Payer: Self-pay | Admitting: Nurse Practitioner

## 2023-10-25 VITALS — BP 124/76 | HR 80 | Resp 14

## 2023-10-25 DIAGNOSIS — N83201 Unspecified ovarian cyst, right side: Secondary | ICD-10-CM

## 2023-10-25 DIAGNOSIS — N958 Other specified menopausal and perimenopausal disorders: Secondary | ICD-10-CM | POA: Diagnosis not present

## 2023-10-25 DIAGNOSIS — N9489 Other specified conditions associated with female genital organs and menstrual cycle: Secondary | ICD-10-CM

## 2023-10-25 NOTE — Progress Notes (Signed)
   Acute Office Visit  Subjective:    Patient ID: Debra Woodward, female    DOB: [birth year], 72 y.o.   MRN: [id number]   HPI 72 y.o. presents today for ovarian cyst. Sent by PCP for follow up on cyst seen on CT scan 08/15/2023. Cyst of right adnexa 4.9 x 4.4 cm, benign-appearing. Asymptomatic. CT scan performed for LLQ pain and left flank pain.   No LMP recorded. Patient has had a hysterectomy.    Review of Systems  Constitutional: Negative.   Genitourinary: Negative.        Objective:    Physical Exam Constitutional:      Appearance: Normal appearance.     BP 124/76   Pulse 80   Resp 14  Wt Readings from Last 3 Encounters:  10/18/23 189 lb 3.2 oz (85.8 kg)  09/12/23 185 lb (83.9 kg)  08/15/23 185 lb (83.9 kg)        Assessment & Plan:   Problem List Items Addressed This Visit   None Visit Diagnoses     Simple adnexal cyst greater than 1 cm in diameter in postmenopausal patient    -  Primary   Relevant Orders   US PELVIS TRANSVAGINAL NON-OB (TV ONLY)      Plan: Reviewed CT scan and benign appearing cyst seen on right ovary. Will repeat ultrasound.       Olivia Mackie DNP, 11:12 AM 10/25/2023

## 2023-10-29 NOTE — Progress Notes (Signed)
Hg A1c is about the same 6.7 early diabetic range  Thyroid and kidney function ok .  Make fu appt for 3 months or as needed ( we can check A1c then)

## 2023-11-01 ENCOUNTER — Ambulatory Visit (AMBULATORY_SURGERY_CENTER): Payer: Medicare PPO | Admitting: *Deleted

## 2023-11-01 VITALS — Ht 64.0 in | Wt 187.0 lb

## 2023-11-01 DIAGNOSIS — H5010 Unspecified exotropia: Secondary | ICD-10-CM | POA: Diagnosis not present

## 2023-11-01 DIAGNOSIS — H40033 Anatomical narrow angle, bilateral: Secondary | ICD-10-CM | POA: Diagnosis not present

## 2023-11-01 DIAGNOSIS — H40023 Open angle with borderline findings, high risk, bilateral: Secondary | ICD-10-CM | POA: Diagnosis not present

## 2023-11-01 DIAGNOSIS — Z800 Family history of malignant neoplasm of digestive organs: Secondary | ICD-10-CM

## 2023-11-01 DIAGNOSIS — Z860100 Personal history of colon polyps, unspecified: Secondary | ICD-10-CM

## 2023-11-01 DIAGNOSIS — H0520 Unspecified exophthalmos: Secondary | ICD-10-CM | POA: Diagnosis not present

## 2023-11-01 NOTE — Progress Notes (Signed)
Pt's name and DOB verified at the beginning of the pre-visit wit 2 identifiers  Pt denies any difficulty with ambulating,sitting, laying down or rolling side to side  Pt has no issues with ambulation   Pt has no issues moving head neck or swallowing  No egg or soy allergy known to patient   No issues known to pt with past sedation with any surgeries or procedures  Pt denies having issues being intubated   No FH of Malignant Hyperthermia  Pt is not on diet pills or shots  Pt is not on home 02   Pt is not on blood thinners    Pt has frequent issues with constipation RN instructed pt to use Miralax per bottles instructions a week before prep days. Pt states they will  Pt is not on dialysis  Pt denise any abnormal heart rhythms   Pt denies any upcoming cardiac testing  Pt encouraged to use to use Singlecare or Goodrx to reduce cost   Patient's chart reviewed by Cathlyn Parsons CNRA prior to pre-visit and patient appropriate for the [facility].  Pre-visit completed and red dot placed by patient's name on their procedure day (on provider's schedule).  .  Visit by phone  Pt states weight is 187 lb  Instructed pt why it is important to and  to call if they have any changes in health or new medications. Directed them to the # given and on instructions.     Instructions reviewed. Pt given both [facility] main # and MD on call # prior to instructions.  Pt states understanding. Instructed to review again prior to procedure. Pt states they will.   Instructions sent by mail with coupon and by My Chart    Coupon sent via text to mobile phone and pt verified they received it

## 2023-11-03 ENCOUNTER — Other Ambulatory Visit: Payer: Self-pay | Admitting: Ophthalmology

## 2023-11-03 DIAGNOSIS — H0520 Unspecified exophthalmos: Secondary | ICD-10-CM

## 2023-11-09 ENCOUNTER — Encounter: Payer: Self-pay | Admitting: Ophthalmology

## 2023-11-17 ENCOUNTER — Telehealth: Payer: Self-pay | Admitting: Internal Medicine

## 2023-11-17 NOTE — Telephone Encounter (Signed)
 CRM Routed  Landen T   11/17/2023 12:01 PM  Eva Chute Called On Behalf Of Patient Debra Woodward, Stating She Needs Authorization Documentation Faxed Over To [facility] Department [phone] In Regards To Sonogram Follow Up And Colonoscopy.   Please advise.

## 2023-11-20 ENCOUNTER — Encounter: Payer: Self-pay | Admitting: Internal Medicine

## 2023-11-20 NOTE — Progress Notes (Signed)
 [facility] History and Physical   Primary Care Physician:  Charlett Apolinar POUR, MD   Reason for Procedure:   Hx colon polyps  Plan:    colonoscopy     HPI: Debra Woodward is a 73 y.o. female w/ hx removal 4 diminutive adenomas in 2016, no polyps in 2019. Here for surveillance exam.   Past Medical History:  Diagnosis Date   ALLERGIC RHINITIS 09/14/2006   Allergy    Fibroid    Hx of adenomatous colonic polyps 07/28/2015   Hypertension    Seasonal allergies     Past Surgical History:  Procedure Laterality Date   ABDOMINAL HYSTERECTOMY  1996   BREAST EXCISIONAL BIOPSY Left 1983   No scar seen   BREAST EXCISIONAL BIOPSY Right 1987   NO scar seen   BREAST SURGERY Bilateral 1983, 1996   benign   CARPAL TUNNEL RELEASE Right 2008   COLONOSCOPY  07/23/2015   CYSTECTOMY Bilateral 1983, 1988   breast; benign   JOINT REPLACEMENT     KNEE ARTHROSCOPY Right 2010   TOE SURGERY Bilateral    TOTAL KNEE ARTHROPLASTY Right 2011    Prior to Admission medications   Medication Sig Start Date End Date Taking? Authorizing Provider  albuterol  (VENTOLIN  HFA) 108 (90 Base) MCG/ACT inhaler Inhale 1-2 puffs into the lungs every 6 (six) hours as needed for wheezing or shortness of breath. 10/10/22   Panosh, Wanda K, MD  amLODipine  (NORVASC ) 5 MG tablet TAKE 1 TABLET EVERY DAY 01/23/23   Webb, Padonda B, FNP  Ascorbic Acid (VITAMIN C) 500 MG CAPS Take 1 capsule by mouth daily.    [provider]  aspirin  81 MG EC tablet 1 by mouth daily or 3 d per week. 10/13/21   Panosh, Wanda K, MD  cetirizine  (ZYRTEC ) 10 MG tablet Take 10 mg by mouth as needed. Patient alternates with Allegra    [provider]  cyclobenzaprine  (FLEXERIL ) 10 MG tablet Take 1 tablet (10 mg total) by mouth 2 (two) times daily as needed for muscle spasms. Patient not taking: Reported on 11/01/2023 08/15/23   Jerrol Agent, MD  desonide  (DESOWEN ) 0.05 % cream as needed.    [provider]   diclofenac Sodium (VOLTAREN) 1 % GEL as needed.    [provider]  docusate sodium  (COLACE) 100 MG capsule Take 1 capsule (100 mg total) by mouth every 12 (twelve) hours. Patient taking differently: Take 100 mg by mouth every 12 (twelve) hours. PRN 06/26/19   Khatri, Hina, PA-C  EPINEPHrine  (EPIPEN  2-PAK) 0.3 mg/0.3 mL IJ SOAJ injection Inject 0.3 mg into the muscle as needed for anaphylaxis. 04/04/22   Panosh, Wanda K, MD  fexofenadine (ALLEGRA) 60 MG tablet Take 60 mg by mouth as needed. Patient alternates with Zyrtec     [provider]  fluticasone  (FLONASE ) 50 MCG/ACT nasal spray USE 2 SPRAY(S) IN EACH NOSTRIL ONCE DAILY AT  NIGHT Patient taking differently: as needed. USE 2 SPRAY(S) IN EACH NOSTRIL ONCE DAILY AT  NIGHT 01/15/21   Ethyl Lonni BRAVO, MD  Glycerin-Polysorbate 80 (REFRESH DRY EYE THERAPY OP) Apply to eye.    [provider]  lidocaine  (LIDODERM ) 5 % Place 1 patch onto the skin daily. Remove & Discard patch within 12 hours or as directed by MD 08/15/23   Jerrol Agent, MD  Multiple Vitamin (MULTIVITAMIN) tablet Take 1 tablet by mouth daily.    [provider]  ondansetron  (ZOFRAN ) 4 MG tablet Take 1 tablet by mouth every 8 (  eight) hours as needed.    [provider]  triamcinolone  ointment (KENALOG ) 0.1 % Apply small amt twice per day x 14 days then twice weekly 11/23/20   Melvenia Sor, NP  VITAMIN D  PO Take 5,000 Int'l Units by mouth every other day.    [provider]    Current Outpatient Medications  Medication Sig Dispense Refill   amLODipine  (NORVASC ) 5 MG tablet TAKE 1 TABLET EVERY DAY 90 tablet 3   albuterol  (VENTOLIN  HFA) 108 (90 Base) MCG/ACT inhaler Inhale 1-2 puffs into the lungs every 6 (six) hours as needed for wheezing or shortness of breath. 3 each 1   Ascorbic Acid (VITAMIN C) 500 MG CAPS Take 1 capsule by mouth daily.     aspirin  81 MG EC tablet 1 by mouth daily or 3 d per week. 30 tablet 12   cetirizine   (ZYRTEC ) 10 MG tablet Take 10 mg by mouth as needed. Patient alternates with Allegra     cyclobenzaprine  (FLEXERIL ) 10 MG tablet Take 1 tablet (10 mg total) by mouth 2 (two) times daily as needed for muscle spasms. (Patient not taking: Reported on 11/01/2023) 20 tablet 0   desonide  (DESOWEN ) 0.05 % cream as needed.     diclofenac Sodium (VOLTAREN) 1 % GEL as needed.     docusate sodium  (COLACE) 100 MG capsule Take 1 capsule (100 mg total) by mouth every 12 (twelve) hours. (Patient taking differently: Take 100 mg by mouth every 12 (twelve) hours. PRN) 30 capsule 0   EPINEPHrine  (EPIPEN  2-PAK) 0.3 mg/0.3 mL IJ SOAJ injection Inject 0.3 mg into the muscle as needed for anaphylaxis. (Patient not taking: Reported on 11/21/2023) 1 each 1   fexofenadine (ALLEGRA) 60 MG tablet Take 60 mg by mouth as needed. Patient alternates with Zyrtec      fluticasone  (FLONASE ) 50 MCG/ACT nasal spray USE 2 SPRAY(S) IN EACH NOSTRIL ONCE DAILY AT  NIGHT (Patient taking differently: as needed. USE 2 SPRAY(S) IN EACH NOSTRIL ONCE DAILY AT  NIGHT) 16 g 6   Glycerin-Polysorbate 80 (REFRESH DRY EYE THERAPY OP) Apply to eye.     lidocaine  (LIDODERM ) 5 % Place 1 patch onto the skin daily. Remove & Discard patch within 12 hours or as directed by MD 30 patch 0   Multiple Vitamin (MULTIVITAMIN) tablet Take 1 tablet by mouth daily.     ondansetron  (ZOFRAN ) 4 MG tablet Take 1 tablet by mouth every 8 (eight) hours as needed.     triamcinolone  ointment (KENALOG ) 0.1 % Apply small amt twice per day x 14 days then twice weekly 30 g 1   VITAMIN D  PO Take 5,000 Int'l Units by mouth every other day.     Current Facility-Administered Medications  Medication Dose Route Frequency Provider Last Rate Last Admin   0.9 %  sodium chloride  infusion  500 mL Intravenous Continuous Avram Lupita BRAVO, MD        Allergies as of 11/21/2023 - Review Complete 11/21/2023  Allergen Reaction Noted   Bee venom Hives 03/14/2017   Prednisone  Other (See Comments)  10/29/2011    Family History  Problem Relation Age of Onset   Stroke Mother    Diabetes Mother    Diabetes Father    Colon polyps Sister    Hypertension Sister    Cancer Sister    Esophageal cancer Paternal Grandfather 12   Colon cancer Neg Hx    Stomach cancer Neg Hx    Rectal cancer Neg Hx     Social  History   Socioeconomic History   Marital status: Single    Spouse name: Not on file   Number of children: Not on file   Years of education: Not on file   Highest education level: Bachelor's degree (e.g., BA, AB, BS)  Occupational History   Not on file  Tobacco Use   Smoking status: Every Day    Current packs/day: 0.25    Types: Cigarettes   Smokeless tobacco: Never  Vaping Use   Vaping status: Never Used  Substance and Sexual Activity   Alcohol use: Not Currently    Comment: social   Drug use: No   Sexual activity: Yes    Partners: Male    Birth control/protection: Surgical    Comment: HYST  Other Topics Concern   Not on file  Social History Narrative   Going to [facility] of 1   No pets       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    Social Drivers of Corporate Investment Banker Strain: Low Risk  (10/18/2023)   Overall Financial Resource Strain (CARDIA)    Difficulty of Paying Living Expenses: Not hard at all  Food Insecurity: No Food Insecurity (07/06/2023)   Hunger Vital Sign    Worried About Running Out of Food in the Last Year: Never true    Ran Out of Food in the Last Year: Never true  Transportation Needs: No Transportation Needs (07/06/2023)   PRAPARE - Administrator, Civil Service (Medical): No    Lack of Transportation (Non-Medical): No  Physical Activity: Sufficiently Active (10/18/2023)   Exercise Vital Sign    Days of Exercise per Week: 3 days    Minutes of Exercise per Session: 60 min  Stress: No Stress Concern Present (10/18/2023)   [employer] of Occupational Health - Occupational Stress Questionnaire    Feeling  of Stress : Not at all  Social Connections: Moderately Integrated (10/18/2023)   Social Connection and Isolation Panel [NHANES]    Frequency of Communication with Friends and Family: More than three times a week    Frequency of Social Gatherings with Friends and Family: Once a week    Attends Religious Services: More than 4 times per year    Active Member of [employer] or Organizations: Yes    Attends Engineer, Structural: More than 4 times per year    Marital Status: Divorced  Intimate Partner Violence: Not At Risk (07/06/2023)   Humiliation, Afraid, Rape, and Kick questionnaire    Fear of Current or Ex-Partner: No    Emotionally Abused: No    Physically Abused: No    Sexually Abused: No    Review of Systems:  All other review of systems negative except as mentioned in the HPI.  Physical Exam: Vital signs BP 127/72   Pulse 81   Temp (!) 97.1 F (36.2 C)   Ht 5' 4 (1.626 m)   Wt 187 lb (84.8 kg)   SpO2 95%   BMI 32.10 kg/m   General:   Alert,  Well-developed, well-nourished, pleasant and cooperative in NAD Lungs:  Clear throughout to auscultation.   Heart:  Regular rate and rhythm; no murmurs, clicks, rubs,  or gallops. Abdomen:  Soft, nontender and nondistended. Normal bowel sounds.   Neuro/Psych:  Alert and cooperative. Normal mood and affect. A and O x 3   @Shanie Mauzy  CHARLENA Commander, MD, [facility] [phone] (pager) 11/21/2023 2:12  PM@

## 2023-11-21 ENCOUNTER — Ambulatory Visit: Payer: Medicare HMO | Admitting: Internal Medicine

## 2023-11-21 ENCOUNTER — Encounter: Payer: Self-pay | Admitting: Internal Medicine

## 2023-11-21 VITALS — BP 151/89 | HR 71 | Temp 97.1°F | Resp 23 | Ht 64.0 in | Wt 187.0 lb

## 2023-11-21 DIAGNOSIS — K644 Residual hemorrhoidal skin tags: Secondary | ICD-10-CM | POA: Diagnosis not present

## 2023-11-21 DIAGNOSIS — K635 Polyp of colon: Secondary | ICD-10-CM | POA: Diagnosis not present

## 2023-11-21 DIAGNOSIS — I10 Essential (primary) hypertension: Secondary | ICD-10-CM | POA: Diagnosis not present

## 2023-11-21 DIAGNOSIS — Z860100 Personal history of colon polyps, unspecified: Secondary | ICD-10-CM

## 2023-11-21 DIAGNOSIS — Z860101 Personal history of adenomatous and serrated colon polyps: Secondary | ICD-10-CM

## 2023-11-21 DIAGNOSIS — D124 Benign neoplasm of descending colon: Secondary | ICD-10-CM

## 2023-11-21 DIAGNOSIS — K648 Other hemorrhoids: Secondary | ICD-10-CM | POA: Diagnosis not present

## 2023-11-21 DIAGNOSIS — D122 Benign neoplasm of ascending colon: Secondary | ICD-10-CM

## 2023-11-21 DIAGNOSIS — Z1211 Encounter for screening for malignant neoplasm of colon: Secondary | ICD-10-CM | POA: Diagnosis not present

## 2023-11-21 HISTORY — PX: OTHER SURGICAL HISTORY: SHX169

## 2023-11-21 MED ORDER — SODIUM CHLORIDE 0.9 % IV SOLN: 500.0000 mL | INTRAVENOUS | Status: DC

## 2023-11-21 NOTE — Patient Instructions (Addendum)
 I found and removed 2 small polyps. I will let you know pathology results and when/if  to have another routine colonoscopy by mail and/or My Chart.  I appreciate the opportunity to care for you. Lupita CHARLENA Commander, MD, [facility]  Handout provided on polyps.   YOU HAD AN ENDOSCOPIC PROCEDURE TODAY AT THE [facility]:   Refer to the procedure report that was given to you for any specific questions about what was found during the examination.  If the procedure report does not answer your questions, please call your gastroenterologist to clarify.  If you requested that your care partner not be given the details of your procedure findings, then the procedure report has been included in a sealed envelope for you to review at your convenience later.  YOU SHOULD EXPECT: Some feelings of bloating in the abdomen. Passage of more gas than usual.  Walking can help get rid of the air that was put into your GI tract during the procedure and reduce the bloating. If you had a lower endoscopy (such as a colonoscopy or flexible sigmoidoscopy) you may notice spotting of blood in your stool or on the toilet paper. If you underwent a bowel prep for your procedure, you may not have a normal bowel movement for a few days.  Please Note:  You might notice some irritation and congestion in your nose or some drainage.  This is from the oxygen used during your procedure.  There is no need for concern and it should clear up in a day or so.  SYMPTOMS TO REPORT IMMEDIATELY:  Following lower endoscopy (colonoscopy or flexible sigmoidoscopy):  Excessive amounts of blood in the stool  Significant tenderness or worsening of abdominal pains  Swelling of the abdomen that is new, acute  Fever of 100F or higher  For urgent or emergent issues, a gastroenterologist can be reached at any hour by calling [phone]. Do not use MyChart messaging for urgent concerns.    DIET:  We do recommend a small meal at first, but then  you may proceed to your regular diet.  Drink plenty of fluids but you should avoid alcoholic beverages for 24 hours.  ACTIVITY:  You should plan to take it easy for the rest of today and you should NOT DRIVE or use heavy machinery until tomorrow (because of the sedation medicines used during the test).    FOLLOW UP: Our staff will call the number listed on your records the next business day following your procedure.  We will call around 7:15- 8:00 am to check on you and address any questions or concerns that you may have regarding the information given to you following your procedure. If we do not reach you, we will leave a message.     If any biopsies were taken you will be contacted by phone or by letter within the next 1-3 weeks.  Please call us  at [phone] if you have not heard about the biopsies in 3 weeks.    SIGNATURES/CONFIDENTIALITY: You and/or your care partner have signed paperwork which will be entered into your electronic medical record.  These signatures attest to the fact that that the information above on your After Visit Summary has been reviewed and is understood.  Full responsibility of the confidentiality of this discharge information lies with you and/or your care-partner.

## 2023-11-21 NOTE — Progress Notes (Signed)
 Patient states there have been no changes to medical or surgical history since time of pre-visit.

## 2023-11-21 NOTE — Op Note (Signed)
 [facility] Patient Name: Debra Woodward Procedure Date: 11/21/2023 2:10 PM MRN: [id number] Endoscopist: Lupita FORBES Commander , MD, [id number] Age: 73 Referring MD:  Date of Birth: [birth year] Gender: Female Account #: [id number] Procedure:                Colonoscopy Indications:              Surveillance: Personal history of adenomatous                            polyps on last colonoscopy > 5 years ago, Last                            colonoscopy: 2019 Medicines:                Monitored Anesthesia Care Procedure:                Pre-Anesthesia Assessment:                           - Prior to the procedure, a History and Physical                            was performed, and patient medications and                            allergies were reviewed. The patient's tolerance of                            previous anesthesia was also reviewed. The risks                            and benefits of the procedure and the sedation                            options and risks were discussed with the patient.                            All questions were answered, and informed consent                            was obtained. Prior Anticoagulants: The patient has                            taken no anticoagulant or antiplatelet agents. ASA                            Grade Assessment: II - A patient with mild systemic                            disease. After reviewing the risks and benefits,                            the patient was deemed in satisfactory condition to  undergo the procedure.                           After obtaining informed consent, the colonoscope                            was passed under direct vision. Throughout the                            procedure, the patient's blood pressure, pulse, and                            oxygen saturations were monitored continuously. The                            PCF-HQ190L Colonoscope [id number] was introduced                             through the anus and advanced to the the cecum,                            identified by appendiceal orifice and ileocecal                            valve. The colonoscopy was somewhat difficult due                            to significant looping and retained fibrous                            material. Successful completion of the procedure                            was aided by applying abdominal pressure and                            lavage. The patient tolerated the procedure well.                            The quality of the bowel preparation was adequate.                            The ileocecal valve, appendiceal orifice, and                            rectum were photographed. The bowel preparation                            used was Miralax  via split dose instruction. Scope In: 2:22:51 PM Scope Out: 2:48:18 PM Scope Withdrawal Time: 0 hours 17 minutes 23 seconds  Total Procedure Duration: 0 hours 25 minutes 27 seconds  Findings:                 The perianal and digital rectal examinations were  normal.                           A 6 mm polyp was found in the descending colon. The                            polyp was sessile. The polyp was removed with a                            cold snare. Resection and retrieval were complete.                            Verification of patient identification for the                            specimen was done. Estimated blood loss was minimal.                           A 1 mm polyp was found in the ascending colon. The                            polyp was sessile. The polyp was removed with a                            cold biopsy forceps. Resection and retrieval were                            complete. Verification of patient identification                            for the specimen was done. Estimated blood loss was                            minimal.                            External and internal hemorrhoids were found.                           The exam was otherwise without abnormality on                            direct and retroflexion views. Complications:            No immediate complications. Estimated Blood Loss:     Estimated blood loss was minimal. Impression:               - One 6 mm polyp in the descending colon, removed                            with a cold snare. Resected and retrieved.                           - One 1 mm polyp in the ascending colon, removed  with a cold biopsy forceps. Resected and retrieved.                           - External and internal hemorrhoids.                           - The examination was otherwise normal on direct                            and retroflexion views.                           - Personal history of colonic polyps. 4 diminutive                            adenomas 2016, no polyps 2019 Recommendation:           - Patient has a contact number available for                            emergencies. The signs and symptoms of potential                            delayed complications were discussed with the                            patient. Return to normal activities tomorrow.                            Written discharge instructions were provided to the                            patient.                           - Resume previous diet.                           - Continue present medications.                           - Await pathology results.                           - Repeat colonoscopy is recommended. The                            colonoscopy date will be determined after pathology                            results from today's exam become available for                            review. Would switch preps and have better fiber  restriction. would also use adult scope to handle                            looping better. It took significant  lavage to                            achieve adequate prep so need to consider repeat                            exam. Lupita FORBES Commander, MD 11/21/2023 2:59:45 PM This report has been signed electronically.

## 2023-11-21 NOTE — Progress Notes (Signed)
 Vss nad trans to pacu

## 2023-11-22 ENCOUNTER — Telehealth: Payer: Self-pay

## 2023-11-22 NOTE — Telephone Encounter (Signed)
 No answer, left message to call if having any issues or concerns, B.Vale Mousseau RN

## 2023-11-22 NOTE — Telephone Encounter (Signed)
 Contacted [employer] and spoke to Niel, a represenative. To get more inform.   He states there was no documentation that someone had called. And ask for reference number. Inform him there was no code or reference number. He states he cannot help as there was no documentation for inform about authorization document. And advise to wait for someone to call back. [id number]

## 2023-11-23 ENCOUNTER — Ambulatory Visit (INDEPENDENT_AMBULATORY_CARE_PROVIDER_SITE_OTHER): Payer: Medicare HMO | Admitting: Nurse Practitioner

## 2023-11-23 ENCOUNTER — Encounter: Payer: Self-pay | Admitting: Nurse Practitioner

## 2023-11-23 ENCOUNTER — Ambulatory Visit (INDEPENDENT_AMBULATORY_CARE_PROVIDER_SITE_OTHER): Payer: Medicare HMO

## 2023-11-23 VITALS — BP 138/82 | HR 76

## 2023-11-23 DIAGNOSIS — N958 Other specified menopausal and perimenopausal disorders: Secondary | ICD-10-CM

## 2023-11-23 DIAGNOSIS — N9489 Other specified conditions associated with female genital organs and menstrual cycle: Secondary | ICD-10-CM | POA: Diagnosis not present

## 2023-11-23 NOTE — Progress Notes (Signed)
   Acute Office Visit  Subjective:    Patient ID: Debra Woodward, female    DOB: [birth year], 73 y.o.   MRN: [id number]   HPI 73 y.o. presents today for ultrasound. Referred by PCP for follow up on cyst seen on CT scan 08/15/2023. Cyst of right adnexa 4.9 x 4.4 cm, benign-appearing. Asymptomatic. CT scan performed for LLQ pain and left flank pain. S/P TAH.  No LMP recorded. Patient has had a hysterectomy.    Review of Systems  Constitutional: Negative.   Genitourinary: Negative.        Objective:    Physical Exam Constitutional:      Appearance: Normal appearance.   GU: Not indicated  BP 138/82   Pulse 76   SpO2 99%  Wt Readings from Last 3 Encounters:  11/21/23 187 lb (84.8 kg)  11/01/23 187 lb (84.8 kg)  10/18/23 189 lb 3.2 oz (85.8 kg)        Assessment & Plan:   Problem List Items Addressed This Visit   None Visit Diagnoses       Simple adnexal cyst less than 5 cm in diameter in premenopausal patient    -  Primary   Relevant Orders   Ovarian Malignancy Risk-ROMA      Vaginal ultrasound: Vaginal cuff within normal limits.  Cervix and uterus are surgically absent.  Right adnexa 5.6 x 4.6 x 4.5 cm simple avascular cyst (the right ovary is not seen separate from it).  Left ovary - 3 simple avascular cyst noted measuring 14 mm, 10 mm, 5 mm.  No free fluid.  Plan: Reviewed ultrasound findings of benign appearing cysts. Will check ROMA today. If normal, no follow up needed. Will monitor.   Return if symptoms worsen or fail to improve.    [name] DNP, 2:03 PM 11/23/2023

## 2023-11-24 ENCOUNTER — Other Ambulatory Visit: Payer: Medicare PPO

## 2023-11-27 LAB — SURGICAL PATHOLOGY

## 2023-11-28 ENCOUNTER — Encounter: Payer: Self-pay | Admitting: Internal Medicine

## 2023-11-28 ENCOUNTER — Telehealth: Payer: Self-pay | Admitting: *Deleted

## 2023-11-28 ENCOUNTER — Other Ambulatory Visit: Payer: Self-pay | Admitting: Family

## 2023-11-28 NOTE — Telephone Encounter (Signed)
 Call returned to patient. Patient left message requesting to discuss SHGM results.   Left groin discomfort started in 08/2024, asking if related to left ovarian cysts? States she was not aware cyst present on left overay until she reviewed AVS, or would have discussed in office.   Patient aware ROMA testing still pending, office will notify once completed and reveiwed by provider. Will provide update to Tiffany and f/u with any additional recommendations. Patient agreeable.   Routing to Tiffany to review.

## 2023-11-28 NOTE — Telephone Encounter (Signed)
 Ultrasound reviewed in completion with patient at her visit. Reassured small benign simple cysts on left ovary. Pain could be related to cysts rupturing. Heat and Ibuprofen or tylenol for pain. ROMA can take up to 2 weeks for results.

## 2023-11-28 NOTE — Telephone Encounter (Signed)
 Patient notified. Encounter closed

## 2023-11-29 LAB — OVARIAN MALIGNANCY RISK-ROMA
CA125: 15 U/mL (ref <35)
HE4, OVARIAN CANCER MONITORING: 48 pmol/L
ROMA Postmenopausal: 1.11 (ref <2.77)
ROMA Premenopausal: 0.68 (ref <1.31)

## 2023-12-11 ENCOUNTER — Ambulatory Visit
Admission: RE | Admit: 2023-12-11 | Discharge: 2023-12-11 | Disposition: A | Discharge status: Home / Self Care | Payer: Medicare HMO | Source: Ambulatory Visit | Attending: Ophthalmology | Admitting: Ophthalmology

## 2023-12-11 DIAGNOSIS — H0520 Unspecified exophthalmos: Secondary | ICD-10-CM | POA: Diagnosis not present

## 2023-12-13 ENCOUNTER — Encounter: Payer: Self-pay | Admitting: Nurse Practitioner

## 2023-12-20 ENCOUNTER — Ambulatory Visit: Payer: Medicare HMO | Admitting: Podiatry

## 2023-12-20 ENCOUNTER — Encounter: Payer: Self-pay | Admitting: Podiatry

## 2023-12-20 VITALS — Ht 64.0 in | Wt 187.0 lb

## 2023-12-20 DIAGNOSIS — M79675 Pain in left toe(s): Secondary | ICD-10-CM

## 2023-12-20 DIAGNOSIS — M79674 Pain in right toe(s): Secondary | ICD-10-CM | POA: Diagnosis not present

## 2023-12-20 DIAGNOSIS — B351 Tinea unguium: Secondary | ICD-10-CM | POA: Diagnosis not present

## 2023-12-28 ENCOUNTER — Other Ambulatory Visit: Payer: Self-pay | Admitting: Internal Medicine

## 2023-12-28 NOTE — Progress Notes (Signed)
  Subjective:  Patient ID: Debra Woodward, female    DOB: 05-25-1951,  MRN: [id number]  73 y.o. female presents painful, elongated thickened toenails x 10 which are symptomatic when wearing enclosed shoe gear. This interferes with his/her daily activities.  Chief Complaint  Patient presents with   RFC    She is here for a nail trim, PCP is dr, charlett and seen last year,    New problem(s): None   PCP is Panosh, Apolinar POUR, MD.  Allergies  Allergen Reactions   Bee Venom Hives   Prednisone  Other (See Comments)    hallucinations    Review of Systems: Negative except as noted in the HPI.   Objective:  Debra Woodward is a pleasant 73 y.o. female in NAD. AAO x 3.  Vascular Examination: Vascular status intact b/l with palpable pedal pulses. CFT immediate b/l. Pedal hair present. No edema. No pain with calf compression b/l. Skin temperature gradient WNL b/l. No varicosities noted. No cyanosis or clubbing noted.  Neurological Examination: Sensation grossly intact b/l with 10 gram monofilament. Vibratory sensation intact b/l.  Dermatological Examination: Pedal skin with normal turgor, texture and tone b/l. No open wounds nor interdigital macerations noted. Toenails 1-5 b/l thick, discolored, elongated with subungual debris and pain on dorsal palpation. No hyperkeratotic lesions noted b/l.   Musculoskeletal Examination: Muscle strength 5/5 to b/l LE.  No pain, crepitus noted b/l. No gross pedal deformities. Patient ambulates independently without assistive aids.   Radiographs: None  Last A1c:      Latest Ref Rng & Units 10/18/2023   10:22 AM  Hemoglobin A1C  Hemoglobin-A1c 4.6 - 6.5 % 6.7      Assessment:   1. Pain due to onychomycosis of toenails of both feet    Plan:  -Mycotic toenails 1-5 bilaterally were debrided in length and girth with sterile nail nippers and dremel. Pinpoint bleeding of right great toe addressed with Lumicain Hemostatic Solution, cleansed with alcohol.  Triple antibiotic ointment applied. No further treatment required by patient/caregiver. -Patient/POA to call should there be question/concern in the interim.  Return in about 4 months (around 04/18/2024).  Debra Woodward, DPM      [place] LOCATION: [address] [place] [address]                   Office [phone]   [facility] LOCATION: [address] [place], [place] [address] Office [phone]

## 2023-12-28 NOTE — Telephone Encounter (Unsigned)
Copied from CRM [id number]. Topic: Clinical - Medication Refill >> Dec 28, 2023  1:52 PM Clayton Bibles wrote: Most Recent Primary Care Visit:  Provider: Berniece Andreas K  Department: [facility]  Visit Type: PHYSICAL  Date: 10/18/2023  Medication: amLODipine (NORVASC) 5 MG tablet  Has the patient contacted their pharmacy? Yes (Agent: If no, request that the patient contact the pharmacy for the refill. If patient does not wish to contact the pharmacy document the reason why and proceed with request.) (Agent: If yes, when and what did the pharmacy advise?) Pharmacy needs refill order from doctor  Is this the correct pharmacy for this prescription? Yes [facility] If no, delete pharmacy and type the correct one.  This is the patient's preferred pharmacy:   [facility] - [place], [place] - [address] [place] [address] Phone: [phone] Fax: [phone]  Has the prescription been filled recently? No  Is the patient out of the medication? No - She has 9 pills left  Has the patient been seen for an appointment in the last year OR does the patient have an upcoming appointment? Yes  Can we respond through MyChart? Yes  Agent: Please be advised that Rx refills may take up to 3 business days. We ask that you follow-up with your pharmacy.

## 2023-12-29 MED ORDER — AMLODIPINE BESYLATE 5 MG PO TABS: ORAL_TABLET | ORAL | 3 refills | Status: AC

## 2024-01-02 DIAGNOSIS — H6993 Unspecified Eustachian tube disorder, bilateral: Secondary | ICD-10-CM | POA: Diagnosis not present

## 2024-01-02 DIAGNOSIS — M25552 Pain in left hip: Secondary | ICD-10-CM | POA: Diagnosis not present

## 2024-01-02 DIAGNOSIS — M26609 Unspecified temporomandibular joint disorder, unspecified side: Secondary | ICD-10-CM | POA: Diagnosis not present

## 2024-01-17 DIAGNOSIS — M25552 Pain in left hip: Secondary | ICD-10-CM | POA: Diagnosis not present

## 2024-01-18 ENCOUNTER — Encounter: Payer: Self-pay | Admitting: Internal Medicine

## 2024-01-18 ENCOUNTER — Ambulatory Visit (INDEPENDENT_AMBULATORY_CARE_PROVIDER_SITE_OTHER): Payer: Medicare PPO | Admitting: Internal Medicine

## 2024-01-18 VITALS — BP 132/82 | HR 77 | Temp 98.0°F | Ht 64.0 in | Wt 190.2 lb

## 2024-01-18 DIAGNOSIS — R739 Hyperglycemia, unspecified: Secondary | ICD-10-CM

## 2024-01-18 DIAGNOSIS — I10 Essential (primary) hypertension: Secondary | ICD-10-CM

## 2024-01-18 DIAGNOSIS — Z79899 Other long term (current) drug therapy: Secondary | ICD-10-CM

## 2024-01-18 LAB — POCT GLYCOSYLATED HEMOGLOBIN (HGB A1C): Hemoglobin A1C: 6.4 % — AB (ref 4.0–5.6)

## 2024-01-18 NOTE — Progress Notes (Signed)
 Chief Complaint  Patient presents with   Medical Management of Chronic Issues    Pt is here for her 3 months follow up and check A1c. Pt reports she fasted and has not taken her BP med since yesterday.     HPI: Debra Woodward 73 y.o. come in for Chronic disease management   fu elevated A1c  since then she has  owrked on avoiding processed cares although not much activity for various reasons  is under eval for r groin plevic ? Hoip pain  and   large ovarian cyst   Had mri per otho also wants opion about hormonal therapy  if would help any thing    . No new problem otherwise  Bp has been ok didn't take meds today cause coming in fasting   PV 12 24  ROS: See pertinent positives and negatives per HPI.  Past Medical History:  Diagnosis Date   ALLERGIC RHINITIS 09/14/2006   Allergy    Fibroid    Hx of adenomatous colonic polyps 07/28/2015   Hypertension    Seasonal allergies     Family History  Problem Relation Age of Onset   Stroke Mother    Diabetes Mother    Diabetes Father    Colon polyps Sister    Hypertension Sister    Cancer Sister    Esophageal cancer Paternal Grandfather 29   Colon cancer Neg Hx    Stomach cancer Neg Hx    Rectal cancer Neg Hx     Social History   Socioeconomic History   Marital status: Single    Spouse name: Not on file   Number of children: Not on file   Years of education: Not on file   Highest education level: Bachelor's degree (e.g., BA, AB, BS)  Occupational History   Not on file  Tobacco Use   Smoking status: Every Day    Current packs/day: 0.25    Types: Cigarettes   Smokeless tobacco: Never  Vaping Use   Vaping status: Never Used  Substance and Sexual Activity   Alcohol use: Not Currently    Comment: social   Drug use: No   Sexual activity: Yes    Partners: Male    Birth control/protection: Surgical    Comment: HYST  Other Topics Concern   Not on file  Social History Narrative   Going to [facility] of [facility]   No pets        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    Social Drivers of Corporate investment banker Strain: Low Risk  (01/16/2024)   Overall Financial Resource Strain (CARDIA)    Difficulty of Paying Living Expenses: Not very hard  Food Insecurity: No Food Insecurity (01/16/2024)   Hunger Vital Sign    Worried About Running Out of Food in the Last Year: Never true    Ran Out of Food in the Last Year: Never true  Transportation Needs: No Transportation Needs (01/16/2024)   PRAPARE - Administrator, Civil Service (Medical): No    Lack of Transportation (Non-Medical): No  Physical Activity: Insufficiently Active (01/16/2024)   Exercise Vital Sign    Days of Exercise per Week: 2 days    Minutes of Exercise per Session: 30 min  Stress: No Stress Concern Present (01/16/2024)   [employer] of Occupational Health - Occupational Stress Questionnaire    Feeling of Stress : Only a little  Social  Connections: Moderately Integrated (01/16/2024)   Social Connection and Isolation Panel [NHANES]    Frequency of Communication with Friends and Family: More than three times a week    Frequency of Social Gatherings with Friends and Family: Once a week    Attends Religious Services: More than 4 times per year    Active Member of [employer] or Organizations: Yes    Attends Engineer, structural: More than 4 times per year    Marital Status: Divorced    Outpatient Medications Prior to Visit  Medication Sig Dispense Refill   albuterol (VENTOLIN HFA) 108 (90 Base) MCG/ACT inhaler Inhale 1-2 puffs into the lungs every 6 (six) hours as needed for wheezing or shortness of breath. 3 each 1   amLODipine (NORVASC) 5 MG tablet TAKE 1 TABLET EVERY DAY 90 tablet 3   Ascorbic Acid (VITAMIN C) 500 MG CAPS Take 1 capsule by mouth daily.     aspirin 81 MG EC tablet 1 by mouth daily or 3 d per week. 30 tablet 12   cetirizine (ZYRTEC) 10 MG tablet Take 10 mg by mouth as needed. Patient alternates with Allegra      cyclobenzaprine (FLEXERIL) 10 MG tablet Take 1 tablet (10 mg total) by mouth 2 (two) times daily as needed for muscle spasms. 20 tablet 0   desonide (DESOWEN) 0.05 % cream as needed.     diclofenac Sodium (VOLTAREN) 1 % GEL as needed.     docusate sodium (COLACE) 100 MG capsule Take 1 capsule (100 mg total) by mouth every 12 (twelve) hours. (Patient taking differently: Take 100 mg by mouth every 12 (twelve) hours. PRN) 30 capsule 0   EPINEPHrine (EPIPEN 2-PAK) 0.3 mg/0.3 mL IJ SOAJ injection Inject 0.3 mg into the muscle as needed for anaphylaxis. 1 each 1   fexofenadine (ALLEGRA) 60 MG tablet Take 60 mg by mouth as needed. Patient alternates with Zyrtec     fluticasone (FLONASE) 50 MCG/ACT nasal spray USE 2 SPRAY(S) IN EACH NOSTRIL ONCE DAILY AT  NIGHT (Patient taking differently: as needed. USE 2 SPRAY(S) IN EACH NOSTRIL ONCE DAILY AT  NIGHT) 16 g 6   Glycerin-Polysorbate 80 (REFRESH DRY EYE THERAPY OP) Apply to eye.     ipratropium (ATROVENT) 0.06 % nasal spray 2 sprays in each nostril twice daily     lidocaine (LIDODERM) 5 % Place 1 patch onto the skin daily. Remove & Discard patch within 12 hours or as directed by MD 30 patch 0   Multiple Vitamin (MULTIVITAMIN) tablet Take 1 tablet by mouth daily.     ondansetron (ZOFRAN) 4 MG tablet Take 1 tablet by mouth every 8 (eight) hours as needed.     triamcinolone ointment (KENALOG) 0.1 % Apply small amt twice per day x 14 days then twice weekly (Patient taking differently: as needed. Apply small amt twice per day x 14 days then twice weekly) 30 g 1   VITAMIN D PO Take 5,000 Int'l Units by mouth every other day.     No facility-administered medications prior to visit.     EXAM:  BP 132/82 (BP Location: Left Arm, Patient Position: Sitting, Cuff Size: Large)   Pulse 77   Temp 98 F (36.7 C) (Oral)   Ht 5\' 4"  (1.626 m)   Wt 190 lb 3.2 oz (86.3 kg)   SpO2 96%   BMI 32.65 kg/m   Body mass index is 32.65 kg/m.  GENERAL: vitals reviewed  and listed above, alert, oriented, appears well hydrated  and in no acute distress HEENT: atraumatic, conjunctiva  clear, no obvious abnormalities on inspection of external nose and ears CV:  MS: moves all extremities without noticeable focal  abnormality ambulatory   PSYCH: pleasant and cooperative, no obvious depression or anxiety Lab Results  Component Value Date   WBC 9.4 08/15/2023   HGB 14.0 08/15/2023   HCT 41.4 08/15/2023   PLT 401 (H) 08/15/2023   GLUCOSE 112 (H) 10/18/2023   CHOL 155 10/18/2023   TRIG 83.0 10/18/2023   HDL 60.50 10/18/2023   LDLCALC 78 10/18/2023   ALT 13 08/15/2023   AST 32 08/15/2023   NA 141 10/18/2023   K 4.1 10/18/2023   CL 105 10/18/2023   CREATININE 0.79 10/18/2023   BUN 9 10/18/2023   CO2 30 10/18/2023   TSH 2.63 10/18/2023   INR 0.94 09/07/2010   HGBA1C 6.4 (A) 01/18/2024   BP Readings from Last 3 Encounters:  01/18/24 132/82  11/23/23 138/82  11/21/23 (!) 151/89   Wt Readings from Last 3 Encounters:  01/18/24 190 lb 3.2 oz (86.3 kg)  12/20/23 187 lb (84.8 kg)  11/21/23 187 lb (84.8 kg)   Record revewi   ASSESSMENT AND PLAN:  Discussed the following assessment and plan:  Hyperglycemia - Plan: POC HgB A1c  Essential hypertension  Medication management  A1c better than last   No meds for now  Continue attention to lifestyle intervention healthy eating and activity   Plan fy in 4-5 months and follow .  Ovarian cysts   getting more opinion .  Dsic  counsel about clarifying needed info for specialists .  -Patient advised to return or notify health care team  if  new concerns arise.  Patient Instructions  Sugar is improved .  Continue lifestyle intervention healthy eating and exercise .  We can add medication but suggest Continue lifestyle intervention healthy eating and activity . .   BP is ok today  continue medication.    Neta Mends. Shanikia Kernodle M.D.

## 2024-01-18 NOTE — Patient Instructions (Addendum)
 Sugar is improved .  Continue lifestyle intervention healthy eating and exercise .  We can add medication but suggest Continue lifestyle intervention healthy eating and activity . .   BP is ok today  continue medication.

## 2024-01-23 ENCOUNTER — Other Ambulatory Visit: Payer: Self-pay | Admitting: Internal Medicine

## 2024-01-23 DIAGNOSIS — Z1231 Encounter for screening mammogram for malignant neoplasm of breast: Secondary | ICD-10-CM

## 2024-01-31 ENCOUNTER — Telehealth: Payer: Self-pay | Admitting: Internal Medicine

## 2024-01-31 DIAGNOSIS — N83209 Unspecified ovarian cyst, unspecified side: Secondary | ICD-10-CM

## 2024-01-31 DIAGNOSIS — M1612 Unilateral primary osteoarthritis, left hip: Secondary | ICD-10-CM | POA: Diagnosis not present

## 2024-01-31 NOTE — Telephone Encounter (Unsigned)
 Copied from CRM [id number]. Topic: Referral - Request for Referral >> Jan 31, 2024  1:40 PM Adele Barthel wrote: Did the patient discuss referral with their provider in the last year? Yes (If No - schedule appointment) (If Yes - send message)  Appointment offered? No  Type of order/referral and detailed reason for visit: Gynecology, for 5mm fibroid cyst seen on MRI with [facility]. Was advised to follow up concerning results  Preference of office, provider, location: Dr. Marcelle Overlie, specializes in hormones and patient would like second opinion  If referral order, have you been seen by this specialty before? Yes (If Yes, this issue or another issue? When? Where?  Can we respond through MyChart? Yes

## 2024-02-02 NOTE — Telephone Encounter (Signed)
 Ok to do referral as requested for second opinion    she did  allude this during last  visit but was unaware we needed to do the referral

## 2024-02-06 NOTE — Telephone Encounter (Signed)
A referral is placed.  

## 2024-03-07 ENCOUNTER — Ambulatory Visit
Admission: RE | Admit: 2024-03-07 | Discharge: 2024-03-07 | Disposition: A | Discharge status: Home / Self Care | Source: Ambulatory Visit | Attending: Internal Medicine | Admitting: Internal Medicine

## 2024-03-07 DIAGNOSIS — Z1231 Encounter for screening mammogram for malignant neoplasm of breast: Secondary | ICD-10-CM | POA: Diagnosis not present

## 2024-03-12 DIAGNOSIS — R1900 Intra-abdominal and pelvic swelling, mass and lump, unspecified site: Secondary | ICD-10-CM | POA: Diagnosis not present

## 2024-03-18 ENCOUNTER — Encounter: Payer: Self-pay | Admitting: Internal Medicine

## 2024-03-21 ENCOUNTER — Ambulatory Visit: Payer: Medicare HMO | Admitting: Podiatry

## 2024-03-21 ENCOUNTER — Encounter: Payer: Self-pay | Admitting: Podiatry

## 2024-03-21 DIAGNOSIS — M79675 Pain in left toe(s): Secondary | ICD-10-CM | POA: Diagnosis not present

## 2024-03-21 DIAGNOSIS — B351 Tinea unguium: Secondary | ICD-10-CM | POA: Diagnosis not present

## 2024-03-21 DIAGNOSIS — M79674 Pain in right toe(s): Secondary | ICD-10-CM

## 2024-03-21 NOTE — Progress Notes (Signed)
  Subjective:  Patient ID: Debra Woodward, female    DOB: 1951-01-11,  MRN: [id number]  73 y.o. female presents to clinic with  painful elongated mycotic toenails 1-5 bilaterally which are tender when wearing enclosed shoe gear. Pain is relieved with periodic professional debridement.  Chief Complaint  Patient presents with   Nail Problem    "Trim my toenails."     New problem(s): None   PCP is Panosh, Joaquim Muir, MD.  Allergies  Allergen Reactions   Bee Venom Hives   Prednisone  Other (See Comments)    hallucinations   Epinephrine  Other (See Comments)    Review of Systems: Negative except as noted in the HPI.   Objective:  Debra Woodward is a pleasant 73 y.o. female in NAD. AAO x 3.  Vascular Examination: Vascular status intact b/l with palpable pedal pulses. CFT immediate b/l. No edema. No pain with calf compression b/l. Skin temperature gradient WNL b/l. No ischemia or gangrene noted b/l LE. No cyanosis or clubbing noted b/l LE.  Neurological Examination: Sensation grossly intact b/l with 10 gram monofilament. Vibratory sensation intact b/l.   Dermatological Examination: Pedal skin with normal turgor, texture and tone b/l. Toenails 1-5 b/l thick, discolored, elongated with subungual debris and pain on dorsal palpation. No corns, calluses nor porokeratotic lesions noted.  Musculoskeletal Examination: Muscle strength 5/5 to b/l LE. No pain, crepitus or joint limitation noted with ROM bilateral LE. No gross bony deformities bilaterally.  Radiographs: None  Last A1c:      Latest Ref Rng & Units 01/18/2024    9:18 AM 10/18/2023   10:22 AM  Hemoglobin A1C  Hemoglobin-A1c 4.0 - 5.6 % 6.4  6.7    Assessment:   1. Pain due to onychomycosis of toenails of both feet    Plan:  Patient was evaluated and treated. All patient's and/or POA's questions/concerns addressed on today's visit. Toenails 1-5 debrided in length and girth without incident. Continue soft, supportive shoe gear  daily. Report any pedal injuries to medical professional. Call office if there are any questions/concerns. -Patient/POA to call should there be question/concern in the interim.  Return in about 3 months (around 06/21/2024).  Debra Woodward, DPM      [facility] LOCATION: [address], [place] [address]                   Office [phone]   [facility] LOCATION: [address] [place], [place] [address] Office [phone]

## 2024-03-25 DIAGNOSIS — H6993 Unspecified Eustachian tube disorder, bilateral: Secondary | ICD-10-CM | POA: Diagnosis not present

## 2024-03-25 DIAGNOSIS — R0982 Postnasal drip: Secondary | ICD-10-CM | POA: Diagnosis not present

## 2024-03-27 DIAGNOSIS — M1612 Unilateral primary osteoarthritis, left hip: Secondary | ICD-10-CM | POA: Diagnosis not present

## 2024-04-10 ENCOUNTER — Telehealth: Payer: Self-pay

## 2024-04-10 NOTE — Telephone Encounter (Signed)
 Sure

## 2024-04-10 NOTE — Telephone Encounter (Signed)
 Contacted pt to schedule an appointment for surgical clearance from [facility]   Pt has upcoming appt on 7/8 for 22m follow up.   Okay to do at the same visit?  Please advise.

## 2024-04-11 NOTE — Telephone Encounter (Signed)
 Noted. Pt will come at appt for surgery clearance and f/u.

## 2024-05-01 DIAGNOSIS — R1900 Intra-abdominal and pelvic swelling, mass and lump, unspecified site: Secondary | ICD-10-CM | POA: Diagnosis not present

## 2024-05-16 DIAGNOSIS — H40023 Open angle with borderline findings, high risk, bilateral: Secondary | ICD-10-CM | POA: Diagnosis not present

## 2024-05-21 ENCOUNTER — Encounter: Payer: Self-pay | Admitting: Internal Medicine

## 2024-05-21 ENCOUNTER — Ambulatory Visit (INDEPENDENT_AMBULATORY_CARE_PROVIDER_SITE_OTHER): Admitting: Internal Medicine

## 2024-05-21 VITALS — BP 126/76 | HR 73 | Temp 98.1°F | Ht 64.0 in | Wt 189.0 lb

## 2024-05-21 DIAGNOSIS — R739 Hyperglycemia, unspecified: Secondary | ICD-10-CM

## 2024-05-21 DIAGNOSIS — H35039 Hypertensive retinopathy, unspecified eye: Secondary | ICD-10-CM

## 2024-05-21 DIAGNOSIS — I10 Essential (primary) hypertension: Secondary | ICD-10-CM | POA: Diagnosis not present

## 2024-05-21 DIAGNOSIS — M1612 Unilateral primary osteoarthritis, left hip: Secondary | ICD-10-CM | POA: Diagnosis not present

## 2024-05-21 DIAGNOSIS — Z79899 Other long term (current) drug therapy: Secondary | ICD-10-CM

## 2024-05-21 DIAGNOSIS — N949 Unspecified condition associated with female genital organs and menstrual cycle: Secondary | ICD-10-CM | POA: Diagnosis not present

## 2024-05-21 DIAGNOSIS — F17200 Nicotine dependence, unspecified, uncomplicated: Secondary | ICD-10-CM

## 2024-05-21 DIAGNOSIS — F17210 Nicotine dependence, cigarettes, uncomplicated: Secondary | ICD-10-CM | POA: Diagnosis not present

## 2024-05-21 DIAGNOSIS — Z01818 Encounter for other preprocedural examination: Secondary | ICD-10-CM

## 2024-05-21 LAB — POCT GLYCOSYLATED HEMOGLOBIN (HGB A1C): Hemoglobin A1C: 6.3 % — AB (ref 4.0–5.6)

## 2024-05-21 NOTE — Progress Notes (Signed)
 Chief Complaint  Patient presents with   Medical Management of Chronic Issues    Pt reports she is here for surgical clearance? She spoke to [facility] with [facility] this morning. That they fax the form over. She will have meet with PA next week.     HPI: Debra Woodward 73 y.o. come in for pre operative evaluation  for   Hip surgery   TKA Dr Ernie for  June 18 2024 for osteoarthritis of  hip. Limiting her activity   and gait.  NO hx of  cardiac pulmonary symptoms  bleeding  or neurologic problems. No dx of OSA. Is  under surveillance care from [facility] for ovarian cysts.  HT :taking amlodipine   Tobacco pack per week.  Hard to stop. But aware and trying.  Has had recent  eye check  and ok.  Hx of allergy to bee stings  : has epipen   Rx and benadryl.  No current wheezing hx ( has albuterol  on hand ( in case had episode over decades ago)  ROS: See pertinent positives and negatives per HPI. 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 12/17/2021   Excessive cerumen in both ear canals 12/17/2021   Sensorineural hearing loss (SNHL), bilateral 12/17/2021   Arthritis of both knees 11/15/2017   Hx of adenomatous colonic polyps 07/28/2015   Degenerative disc disease, lumbar sacral 11/13/2012   Elevated blood pressure reading 11/13/2012   Allergic rhinitis 09/14/2006     Past Medical History:  Diagnosis Date   ALLERGIC RHINITIS 09/14/2006   Allergy    Arthralgia of left ankle 04/20/2021   Fibroid    Hx of adenomatous colonic polyps 07/28/2015   Hypertension    Impacted cerumen of left ear 04/14/2022   Seasonal allergies     Family History  Problem Relation Age of Onset   Stroke Mother    Diabetes Mother    Diabetes Father    Colon polyps Sister    Hypertension Sister    Cancer Sister    Esophageal cancer Paternal Grandfather 58   Colon cancer  Neg Hx    Stomach cancer Neg Hx    Rectal cancer Neg Hx    Breast cancer Neg Hx    BRCA 1/2 Neg Hx     Social History   Socioeconomic History   Marital status: Single    Spouse name: Not on file   Number of children: Not on file   Years of education: Not on file   Highest education level: Bachelor's degree (e.g., BA, AB, BS)  Occupational History   Not on file  Tobacco Use   Smoking status: Every Day    Current packs/day: 0.35    Types: Cigarettes   Smokeless tobacco: Never  Vaping Use   Vaping status: Never Used  Substance and Sexual Activity   Alcohol use: Not Currently    Comment: social   Drug use: No   Sexual activity: Yes    Partners: Male    Birth control/protection: Surgical    Comment: HYST  Other Topics Concern   Not on file  Social History Narrative   Going to [facility] of 1   No pets       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    Social Drivers of Corporate investment banker Strain:  Low Risk  (05/18/2024)   Overall Financial Resource Strain (CARDIA)    Difficulty of Paying Living Expenses: Not very hard  Food Insecurity: No Food Insecurity (05/18/2024)   Hunger Vital Sign    Worried About Running Out of Food in the Last Year: Never true    Ran Out of Food in the Last Year: Never true  Transportation Needs: No Transportation Needs (05/18/2024)   PRAPARE - Administrator, Civil Service (Medical): No    Lack of Transportation (Non-Medical): No  Physical Activity: Sufficiently Active (05/18/2024)   Exercise Vital Sign    Days of Exercise per Week: 2 days    Minutes of Exercise per Session: 120 min  Stress: No Stress Concern Present (05/18/2024)   [employer] of Occupational Health - Occupational Stress Questionnaire    Feeling of Stress: Only a little  Social Connections: Moderately Integrated (05/18/2024)   Social Connection and Isolation Panel    Frequency of Communication with Friends and Family: More than three times a week     Frequency of Social Gatherings with Friends and Family: Once a week    Attends Religious Services: More than 4 times per year    Active Member of [employer] or Organizations: Yes    Attends Engineer, structural: More than 4 times per year    Marital Status: Divorced    Outpatient Medications Prior to Visit  Medication Sig Dispense Refill   albuterol  (VENTOLIN  HFA) 108 (90 Base) MCG/ACT inhaler Inhale 1-2 puffs into the lungs every 6 (six) hours as needed for wheezing or shortness of breath. 3 each 1   amLODipine  (NORVASC ) 5 MG tablet TAKE 1 TABLET EVERY DAY 90 tablet 3   Ascorbic Acid (VITAMIN C) 500 MG CAPS Take 1 capsule by mouth daily.     aspirin 81 MG EC tablet 1 by mouth daily or 3 d per week. 30 tablet 12   cetirizine  (ZYRTEC ) 10 MG tablet Take 10 mg by mouth as needed. Patient alternates with Allegra     desonide  (DESOWEN ) 0.05 % cream as needed.     diclofenac Sodium (VOLTAREN) 1 % GEL as needed.     docusate sodium  (COLACE) 100 MG capsule Take 1 capsule (100 mg total) by mouth every 12 (twelve) hours. 30 capsule 0   EPINEPHrine  (EPIPEN  2-PAK) 0.3 mg/0.3 mL IJ SOAJ injection Inject 0.3 mg into the muscle as needed for anaphylaxis. 1 each 1   fexofenadine (ALLEGRA) 60 MG tablet Take 60 mg by mouth as needed. Patient alternates with Zyrtec      fluticasone  (FLONASE ) 50 MCG/ACT nasal spray USE 2 SPRAY(S) IN EACH NOSTRIL ONCE DAILY AT  NIGHT (Patient taking differently: as needed. USE 2 SPRAY(S) IN EACH NOSTRIL ONCE DAILY AT  NIGHT) 16 g 6   Glycerin-Polysorbate 80 (REFRESH DRY EYE THERAPY OP) Apply to eye.     ipratropium (ATROVENT) 0.06 % nasal spray 2 sprays in each nostril twice daily     lidocaine  (LIDODERM ) 5 % Place 1 patch onto the skin daily. Remove & Discard patch within 12 hours or as directed by MD 30 patch 0   Multiple Vitamin (MULTIVITAMIN) tablet Take 1 tablet by mouth daily.     VITAMIN D  PO Take 5,000 Int'l Units by mouth every other day.     cyclobenzaprine   (FLEXERIL ) 10 MG tablet Take 1 tablet (10 mg total) by mouth 2 (two) times daily as needed for muscle spasms. (Patient not taking: Reported on 05/21/2024) 20 tablet 0  ondansetron  (ZOFRAN ) 4 MG tablet Take 1 tablet by mouth every 8 (eight) hours as needed. (Patient not taking: Reported on 05/21/2024)     triamcinolone  ointment (KENALOG ) 0.1 % Apply small amt twice per day x 14 days then twice weekly (Patient not taking: Reported on 05/21/2024) 30 g 1   No facility-administered medications prior to visit.     EXAM:  BP 126/76 (BP Location: Left Arm, Patient Position: Sitting, Cuff Size: Large)   Pulse 73   Temp 98.1 F (36.7 C) (Oral)   Ht 5' 4 (1.626 m)   Wt 189 lb (85.7 kg)   SpO2 96%   BMI 32.44 kg/m   Body mass index is 32.44 kg/m.  GENERAL: vitals reviewed and listed above, alert, oriented, appears well hydrated and in no acute distress HEENT: atraumatic, conjunctiva  clear, no obvious abnormalities on inspection of external nose and ears tmx clear  OP : no lesion edema or exudate  NECK: no obvious masses on inspection palpation  LUNGS: clear to auscultation bilaterally, no wheezes, rales or rhonchi, good air movement CV: HRRR, no clubbing cyanosis or  peripheral edema nl cap refill  Abdomen:  Sof,t normal bowel sounds without hepatosplenomegaly, no guarding rebound or masses no CVA tenderness MS: moves all extremities without noticeable focal  abnormality mild limp favoring left hip when walking but independent  gait.  Neuro : non focal  no  motor abnormality.  PSYCH: pleasant and cooperative, no obvious depression or anxiety Lab Results  Component Value Date   WBC 9.4 08/15/2023   HGB 14.0 08/15/2023   HCT 41.4 08/15/2023   PLT 401 (H) 08/15/2023   GLUCOSE 112 (H) 10/18/2023   CHOL 155 10/18/2023   TRIG 83.0 10/18/2023   HDL 60.50 10/18/2023   LDLCALC 78 10/18/2023   ALT 13 08/15/2023   AST 32 08/15/2023   NA 141 10/18/2023   K 4.1 10/18/2023   CL 105 10/18/2023    CREATININE 0.79 10/18/2023   BUN 9 10/18/2023   CO2 30 10/18/2023   TSH 2.63 10/18/2023   INR 0.94 09/07/2010   HGBA1C 6.3 (A) 05/21/2024   BP Readings from Last 3 Encounters:  05/21/24 126/76  01/18/24 132/82  11/23/23 138/82    ASSESSMENT AND PLAN:  Discussed the following assessment and plan:  Arthritis of left hip  Pre-operative clearance  Medication management  Hyperglycemia - Plan: POC HgB A1c  Essential hypertension  Hypertensive retinopathy, unspecified laterality  Tobacco use disorder  Adnexal cyst\  Stable conditions for planned surgery . Hypertension controlled  Hyperglycemia prediabetes ( dm) is stable ( peaked A1c 6.7 now 6.3  )  Expectant management.  Medical is optimized for surgery  ( x tobacco use) . Form completed to be faxed and will forward note to dr Lauren' office  -Patient advised to return or notify health care team  if  new concerns arise.  Patient Instructions  Will send  form for   clearance for surgery.   Still advise to stop tobacco.   Good luck with the surgery.   Plan  December visit as planned.   Tremane Spurgeon K. Twilia Yaklin M.D.

## 2024-05-21 NOTE — Patient Instructions (Addendum)
 Will send  form for   clearance for surgery.   Still advise to stop tobacco.   Good luck with the surgery.   Plan  December visit as planned.

## 2024-06-03 NOTE — Patient Instructions (Signed)
 SURGICAL WAITING ROOM VISITATION Patients having surgery or a procedure may have no more than 2 support people in the waiting area - these visitors may rotate in the visitor waiting room.   If the patient needs to stay at the hospital during part of their recovery, the visitor guidelines for inpatient rooms apply.  PRE-OP VISITATION  Pre-op nurse will coordinate an appropriate time for 1 support person to accompany the patient in pre-op.  This support person may not rotate.  This visitor will be contacted when the time is appropriate for the visitor to come back in the pre-op area.  Please refer to the [facility] website for the visitor guidelines for Inpatients (after your surgery is over and you are in a regular room).  You are not required to quarantine at this time prior to your surgery. However, you must do this: Hand Hygiene often Do NOT share personal items Notify your provider if you are in close contact with someone who has COVID or you develop fever 100.4 or greater, new onset of sneezing, cough, sore throat, shortness of breath or body aches.  If you test positive for Covid or have been in contact with anyone that has tested positive in the last 10 days please notify you surgeon.    Your procedure is scheduled on:  TUESDAY  June 18, 2024  Report to [facility] Main Entrance: [address] entrance where the [employer] is available.   Report to admitting at: 11:00 AM  Call this number if you have any questions or problems the morning of surgery [phone]  Do not eat food after Midnight the night prior to your surgery/procedure.  After Midnight you may have the following liquids until   10:30 AM DAY OF SURGERY  Clear Liquid Diet Water Black Coffee (sugar ok, NO MILK/CREAM OR CREAMERS)  Tea (sugar ok, NO MILK/CREAM OR CREAMERS) regular and decaf                             Plain Jell-O  with no fruit (NO RED)                                           Fruit ices  (not with fruit pulp, NO RED)                                     Popsicles (NO RED)                                                                  Juice: NO CITRUS JUICES: only apple, WHITE grape, WHITE cranberry Sports drinks like Gatorade or Powerade (NO RED)                   The day of surgery:  Drink ONE (1) Pre-Surgery G2 at  10:30 AM the morning of surgery. Drink in one sitting. Do not sip.  This drink was given to you during your hospital pre-op appointment visit. Nothing else to drink after completing the Pre-Surgery G2 :  No candy, chewing gum or throat lozenges.    FOLLOW ANY ADDITIONAL PRE OP INSTRUCTIONS YOU RECEIVED FROM YOUR SURGEON'S OFFICE!!!   Oral Hygiene is also important to reduce your risk of infection.        Remember - BRUSH YOUR TEETH THE MORNING OF SURGERY WITH YOUR REGULAR TOOTHPASTE  Do NOT smoke after Midnight the night before surgery.  STOP TAKING all Vitamins, Herbs and supplements 1 week before your  surgery.   ASPIRIN - Stop taking 7 days before your surgery  Last Dose will be taken on 06-10-2024  Take ONLY these medicines the morning of surgery with A SIP OF WATER: Amlodipine , You may use your nasal sprays and inhaler if needed. Please bring your Albuterol  Inhaler with you on the day of surgery.                    You may not have any metal on your body including hair pins, jewelry, and body piercing  Do not wear make-up, lotions, powders, perfumes or deodorant  Do not wear nail polish including gel and S&S, artificial / acrylic nails, or any other type of covering on natural nails including finger and toenails. If you have artificial nails, gel coating, etc., that needs to be removed by a nail salon, Please have this removed prior to surgery. Not doing so may mean that your surgery could be cancelled or delayed if the Surgeon or anesthesia staff feels like they are unable to monitor you safely.   Do not shave 48 hours prior to surgery to avoid  nicks in your skin which may contribute to postoperative infections.   Contacts, Hearing Aids, dentures or bridgework may not be worn into surgery. DENTURES WILL BE REMOVED PRIOR TO SURGERY PLEASE DO NOT APPLY Poly grip OR ADHESIVES!!!  You may bring a small overnight bag with you on the day of surgery, only pack items that are not valuable. [facility] IS NOT RESPONSIBLE   FOR VALUABLES THAT ARE LOST OR STOLEN.   Do not bring your home medications to the hospital. The Pharmacy will dispense medications listed on your medication list to you during your admission in the Hospital.  Please read over the following fact sheets you were given: IF YOU HAVE QUESTIONS ABOUT YOUR PRE-OP INSTRUCTIONS, PLEASE CALL [phone].     Pre-operative 5 CHG Bath Instructions   You can play a key role in reducing the risk of infection after surgery. Your skin needs to be as free of germs as possible. You can reduce the number of germs on your skin by washing with CHG (chlorhexidine gluconate) soap before surgery. CHG is an antiseptic soap that kills germs and continues to kill germs even after washing.   DO NOT use if you have an allergy to chlorhexidine/CHG or antibacterial soaps. If your skin becomes reddened or irritated, stop using the CHG and notify one of our RNs at [phone]  Please shower with the CHG soap starting 4 days before surgery using the following schedule: START SHOWERS ON   FRIDAY   June 14, 2024  Please keep in mind the following:  DO NOT shave, including legs and underarms, starting the day of your first shower.   You may shave your face at any point before/day of surgery.   Place clean sheets on your bed the day you start using CHG soap. Use a clean washcloth (not used since being washed) for each shower. DO  NOT sleep with pets once you start using the CHG.   CHG Shower Instructions:  If you choose to wash your hair and private area, wash first with your normal shampoo/soap.  After you use shampoo/soap, rinse your hair and body thoroughly to remove shampoo/soap residue.  Turn the water OFF and apply about 3 tablespoons (45 ml) of CHG soap to a CLEAN washcloth.  Apply CHG soap ONLY FROM YOUR NECK DOWN TO YOUR TOES (washing for 3-5 minutes)  DO NOT use CHG soap on face, private areas, open wounds, or sores.  Pay special attention to the area where your surgery is being performed.  If you are having back surgery, having someone wash your back for you may be helpful.  Wait 2 minutes after CHG soap is applied, then you may rinse off the CHG soap.  Pat dry with a clean towel  Put on clean clothes/pajamas   If you choose to wear lotion, please use ONLY the CHG-compatible lotions on the back of this paper.     Additional instructions for the day of surgery: DO NOT APPLY any lotions, deodorants, cologne, or perfumes.   Put on clean/comfortable clothes.  Brush your teeth.  Ask your nurse before applying any prescription medications to the skin.      CHG Compatible Lotions   Aveeno Moisturizing lotion  Cetaphil Moisturizing Cream  Cetaphil Moisturizing Lotion  Clairol Herbal Essence Moisturizing Lotion, Dry Skin  Clairol Herbal Essence Moisturizing Lotion, Extra Dry Skin  Clairol Herbal Essence Moisturizing Lotion, Normal Skin  Curel Age Defying Therapeutic Moisturizing Lotion with Alpha Hydroxy  Curel Extreme Care Body Lotion  Curel Soothing Hands Moisturizing Hand Lotion  Curel Therapeutic Moisturizing Cream, Fragrance-Free  Curel Therapeutic Moisturizing Lotion, Fragrance-Free  Curel Therapeutic Moisturizing Lotion, Original Formula  Eucerin Daily Replenishing Lotion  Eucerin Dry Skin Therapy Plus Alpha Hydroxy Crme  Eucerin Dry Skin Therapy Plus Alpha Hydroxy Lotion  Eucerin Original  Crme  Eucerin Original Lotion  Eucerin Plus Crme Eucerin Plus Lotion  Eucerin TriLipid Replenishing Lotion  Keri Anti-Bacterial Hand Lotion  Keri Deep Conditioning Original Lotion Dry Skin Formula Softly Scented  Keri Deep Conditioning Original Lotion, Fragrance Free Sensitive Skin Formula  Keri Lotion Fast Absorbing Fragrance Free Sensitive Skin Formula  Keri Lotion Fast Absorbing Softly Scented Dry Skin Formula  Keri Original Lotion  Keri Skin Renewal Lotion Keri Silky Smooth Lotion  Keri Silky Smooth Sensitive Skin Lotion  Nivea Body Creamy Conditioning Oil  Nivea Body Extra Enriched Lotion  Nivea Body Original Lotion  Nivea Body Sheer Moisturizing Lotion Nivea Crme  Nivea Skin Firming Lotion  NutraDerm 30 Skin Lotion  NutraDerm Skin Lotion  NutraDerm Therapeutic Skin Cream  NutraDerm Therapeutic Skin Lotion  ProShield Protective Hand Cream  Provon moisturizing lotion   FAILURE TO FOLLOW THESE INSTRUCTIONS MAY RESULT IN THE CANCELLATION OF YOUR SURGERY  PATIENT SIGNATURE_________________________________  NURSE SIGNATURE__________________________________  ________________________________________________________________________        Nasario Exon    An incentive spirometer is a tool that can help keep your lungs clear and active. This tool measures how well you are filling your lungs with each breath.  Taking long deep breaths may help reverse or decrease the chance of developing breathing (pulmonary) problems (especially infection) following: A long period of time when you are unable to move or be active. BEFORE THE PROCEDURE  If the spirometer includes an indicator to show your best effort, your nurse or respiratory therapist will set it to a desired goal. If possible, sit up straight or lean slightly forward. Try not to slouch. Hold the incentive spirometer in an upright position. INSTRUCTIONS FOR USE  Sit on the edge of your bed if possible, or sit up  as far as you can in bed or on a chair. Hold the incentive spirometer in an upright position. Breathe out normally. Place the mouthpiece in your mouth and seal your lips tightly around it. Breathe in slowly and as deeply as possible, raising the piston or the ball toward the top of the column. Hold your breath for 3-5 seconds or for as long as possible. Allow the piston or ball to fall to the bottom of the column. Remove the mouthpiece from your mouth and breathe out normally. Rest for a few seconds and repeat Steps 1 through 7 at least 10 times every 1-2 hours when you are awake. Take your time and take a few normal breaths between deep breaths. The spirometer may include an indicator to show your best effort. Use the indicator as a goal to work toward during each repetition. After each set of 10 deep breaths, practice coughing to be sure your lungs are clear. If you have an incision (the cut made at the time of surgery), support your incision when coughing by placing a pillow or rolled up towels firmly against it. Once you are able to get out of bed, walk around indoors and cough well. You may stop using the incentive spirometer when instructed by your caregiver.  RISKS AND COMPLICATIONS Take your time so you do not get dizzy or light-headed. If you are in pain, you may need to take or ask for pain medication before doing incentive spirometry. It is harder to take a deep breath if you are having pain. AFTER USE Rest and breathe slowly and easily. It can be helpful to keep track of a log of your progress. Your caregiver can provide you with a simple table to help with this. If you are using the spirometer at home, follow these instructions: SEEK MEDICAL CARE IF:  You are having difficultly using the spirometer. You have trouble using the spirometer as often as instructed. Your pain medication is not giving enough relief while using the spirometer. You develop fever of 100.5 F (38.1 C) or  higher.                                                                                                    SEEK IMMEDIATE MEDICAL CARE IF:  You cough up bloody sputum that had not been present before. You develop fever of 102 F (38.9 C) or greater. You develop worsening pain at or near the incision site. MAKE SURE YOU:  Understand these instructions. Will watch your  condition. Will get help right away if you are not doing well or get worse. Document Released: 03/13/2007 Document Revised: 01/23/2012 Document Reviewed: 05/14/2007 [facility] Patient Information 2014 [place], [place].       WHAT IS A BLOOD TRANSFUSION? Blood Transfusion Information  A transfusion is the replacement of blood or some of its parts. Blood is made up of multiple cells which provide different functions. Red blood cells carry oxygen and are used for blood loss replacement. White blood cells fight against infection. Platelets control bleeding. Plasma helps clot blood. Other blood products are available for specialized needs, such as hemophilia or other clotting disorders. BEFORE THE TRANSFUSION  Who gives blood for transfusions?  Healthy volunteers who are fully evaluated to make sure their blood is safe. This is blood bank blood. Transfusion therapy is the safest it has ever been in the practice of medicine. Before blood is taken from a donor, a complete history is taken to make sure that person has no history of diseases nor engages in risky social behavior (examples are intravenous drug use or sexual activity with multiple partners). The donor's travel history is screened to minimize risk of transmitting infections, such as malaria. The donated blood is tested for signs of infectious diseases, such as HIV and hepatitis. The blood is then tested to be sure it is compatible with you in order to minimize the chance of a transfusion reaction. If you or a relative donates blood, this is often done in anticipation of  surgery and is not appropriate for emergency situations. It takes many days to process the donated blood. RISKS AND COMPLICATIONS Although transfusion therapy is very safe and saves many lives, the main dangers of transfusion include:  Getting an infectious disease. Developing a transfusion reaction. This is an allergic reaction to something in the blood you were given. Every precaution is taken to prevent this. The decision to have a blood transfusion has been considered carefully by your caregiver before blood is given. Blood is not given unless the benefits outweigh the risks. AFTER THE TRANSFUSION Right after receiving a blood transfusion, you will usually feel much better and more energetic. This is especially true if your red blood cells have gotten low (anemic). The transfusion raises the level of the red blood cells which carry oxygen, and this usually causes an energy increase. The nurse administering the transfusion will monitor you carefully for complications. HOME CARE INSTRUCTIONS  No special instructions are needed after a transfusion. You may find your energy is better. Speak with your caregiver about any limitations on activity for underlying diseases you may have. SEEK MEDICAL CARE IF:  Your condition is not improving after your transfusion. You develop redness or irritation at the intravenous (IV) site. SEEK IMMEDIATE MEDICAL CARE IF:  Any of the following symptoms occur over the next 12 hours: Shaking chills. You have a temperature by mouth above 102 F (38.9 C), not controlled by medicine. Chest, back, or muscle pain. People around you feel you are not acting correctly or are confused. Shortness of breath or difficulty breathing. Dizziness and fainting. You get a rash or develop hives. You have a decrease in urine output. Your urine turns a dark color or changes to pink, red, or brown. Any of the following symptoms occur over the next 10 days: You have a temperature by  mouth above 102 F (38.9 C), not controlled by medicine. Shortness of breath. Weakness after normal activity. The white part of the eye turns yellow (jaundice). You  have a decrease in the amount of urine or are urinating less often. Your urine turns a dark color or changes to pink, red, or brown. Document Released: 10/28/2000 Document Revised: 01/23/2012 Document Reviewed: 06/16/2008 [facility] Patient Information 2014 [place], [place].  _______________________________________________________________________         If you would like to see a video about joint replacement:   [email or web address]

## 2024-06-03 NOTE — Progress Notes (Signed)
 COVID Vaccine received:  []  No [x]  Yes Date of any COVID positive Test in last 90 days:  PCP - Apolinar Eastern, MD [phone] (Work)   clearance in 05-21-24 Epic note and scanned to [employer].  Cardiologist -   Chest x-ray -  EKG -  do DOS Stress Test -  ECHO -  Cardiac Cath -  CT Coronary Calcium score:   Pacemaker / ICD device [x]  No []  Yes   Spinal Cord Stimulator:[x]  No []  Yes       History of Sleep Apnea? [x]  No []  Yes   CPAP used?- [x]  No []  Yes    Does the patient monitor blood sugar?   []  N/A   []  No []  Yes  Patient has: []  NO Hx DM   [x]  Pre-DM   []  DM1  []   DM2 Last A1c was: 6.3   on 05-21-24  No meds      Blood Thinner / Instructions: none Aspirin Instructions: ASA 81 mg  ERAS Protocol Ordered: []  No  [x]  Yes PRE-SURGERY []  ENSURE  [x]  G2   Patient is to be NPO after: 01030  Dental hx: []  Dentures:  []  N/A      []  Bridge or Partial:                   []  Loose or Damaged teeth:   Comments: Patient was given the 5 CHG shower / bath instructions for THA surgery along with 2 bottles of the CHG soap. Patient will start this on:   06-14-24           Activity level: Able to walk up 2 flights of stairs without becoming significantly short of breath or having chest pain?  []  No   []    Yes   Anesthesia review: HTN, smoker, Pre-DM, HOH-   Patient denies any S&S of respiratory illness or Covid - no shortness of breath, fever, cough or chest pain at PAT appointment.  Patient verbalized understanding and agreement to the Pre-Surgical Instructions that were given to them at this PAT appointment. Patient was also educated of the need to review these PAT instructions again prior to her surgery.I reviewed the appropriate phone numbers to call if they have any and questions or concerns.

## 2024-06-05 ENCOUNTER — Encounter (HOSPITAL_COMMUNITY)
Admission: RE | Admit: 2024-06-05 | Discharge: 2024-06-05 | Disposition: A | Discharge status: Home / Self Care | Source: Ambulatory Visit | Attending: Orthopedic Surgery | Admitting: Orthopedic Surgery

## 2024-06-05 ENCOUNTER — Other Ambulatory Visit: Payer: Self-pay

## 2024-06-05 ENCOUNTER — Encounter (HOSPITAL_COMMUNITY): Payer: Self-pay

## 2024-06-05 VITALS — BP 127/76 | HR 68 | Temp 98.1°F | Resp 20 | Ht 64.5 in | Wt 190.0 lb

## 2024-06-05 DIAGNOSIS — Z01818 Encounter for other preprocedural examination: Secondary | ICD-10-CM | POA: Insufficient documentation

## 2024-06-05 DIAGNOSIS — M1612 Unilateral primary osteoarthritis, left hip: Secondary | ICD-10-CM | POA: Insufficient documentation

## 2024-06-05 DIAGNOSIS — I10 Essential (primary) hypertension: Secondary | ICD-10-CM | POA: Insufficient documentation

## 2024-06-05 HISTORY — DX: Anxiety disorder, unspecified: F41.9

## 2024-06-05 HISTORY — DX: Prediabetes: R73.03

## 2024-06-05 HISTORY — DX: Other specified postprocedural states: Z98.890

## 2024-06-05 HISTORY — DX: Unspecified osteoarthritis, unspecified site: M19.90

## 2024-06-05 HISTORY — DX: Unspecified asthma, uncomplicated: J45.909

## 2024-06-05 LAB — BASIC METABOLIC PANEL WITH GFR
Anion gap: 9 (ref 5–15)
BUN: 12 mg/dL (ref 8–23)
CO2: 24 mmol/L (ref 22–32)
Calcium: 10 mg/dL (ref 8.9–10.3)
Chloride: 108 mmol/L (ref 98–111)
Creatinine, Ser: 0.69 mg/dL (ref 0.44–1.00)
GFR, Estimated: >60 mL/min (ref >60)
Glucose, Bld: 97 mg/dL (ref 70–99)
Potassium: 4 mmol/L (ref 3.5–5.1)
Sodium: 141 mmol/L (ref 135–145)

## 2024-06-05 LAB — SURGICAL PCR SCREEN
MRSA, PCR: NEGATIVE
Staphylococcus aureus: NEGATIVE

## 2024-06-05 LAB — TYPE AND SCREEN
ABO/RH(D): O POS
Antibody Screen: NEGATIVE

## 2024-06-05 LAB — CBC
HCT: 39.5 % (ref 36.0–46.0)
Hemoglobin: 13.1 g/dL (ref 12.0–15.0)
MCH: 32.6 pg (ref 26.0–34.0)
MCHC: 33.2 g/dL (ref 30.0–36.0)
MCV: 98.3 fL (ref 80.0–100.0)
Platelets: 445 K/uL — ABNORMAL HIGH (ref 150–400)
RBC: 4.02 MIL/uL (ref 3.87–5.11)
RDW: 14.6 % (ref 11.5–15.5)
WBC: 6.9 K/uL (ref 4.0–10.5)
nRBC: 0 % (ref 0.0–0.2)

## 2024-06-18 ENCOUNTER — Ambulatory Visit (HOSPITAL_COMMUNITY): Payer: Self-pay | Admitting: Anesthesiology

## 2024-06-18 ENCOUNTER — Ambulatory Visit (HOSPITAL_BASED_OUTPATIENT_CLINIC_OR_DEPARTMENT_OTHER): Payer: Self-pay | Admitting: Anesthesiology

## 2024-06-18 ENCOUNTER — Encounter (HOSPITAL_COMMUNITY)
Admission: RE | Disposition: A | Discharge status: Home / Self Care | Payer: Self-pay | Source: Home / Self Care | Attending: Orthopedic Surgery

## 2024-06-18 ENCOUNTER — Other Ambulatory Visit: Payer: Self-pay

## 2024-06-18 ENCOUNTER — Observation Stay (HOSPITAL_COMMUNITY)

## 2024-06-18 ENCOUNTER — Ambulatory Visit (HOSPITAL_COMMUNITY)

## 2024-06-18 ENCOUNTER — Encounter (HOSPITAL_COMMUNITY): Payer: Self-pay | Admitting: Orthopedic Surgery

## 2024-06-18 ENCOUNTER — Observation Stay (HOSPITAL_COMMUNITY)
Admission: RE | Admit: 2024-06-18 | Discharge: 2024-06-19 | Disposition: A | Discharge status: Home / Self Care | Attending: Orthopedic Surgery | Admitting: Orthopedic Surgery

## 2024-06-18 DIAGNOSIS — F17210 Nicotine dependence, cigarettes, uncomplicated: Secondary | ICD-10-CM | POA: Diagnosis not present

## 2024-06-18 DIAGNOSIS — Z96642 Presence of left artificial hip joint: Secondary | ICD-10-CM | POA: Diagnosis not present

## 2024-06-18 DIAGNOSIS — M1612 Unilateral primary osteoarthritis, left hip: Principal | ICD-10-CM | POA: Insufficient documentation

## 2024-06-18 DIAGNOSIS — F419 Anxiety disorder, unspecified: Secondary | ICD-10-CM | POA: Diagnosis not present

## 2024-06-18 DIAGNOSIS — Z96651 Presence of right artificial knee joint: Secondary | ICD-10-CM | POA: Diagnosis not present

## 2024-06-18 DIAGNOSIS — J45909 Unspecified asthma, uncomplicated: Secondary | ICD-10-CM | POA: Diagnosis not present

## 2024-06-18 DIAGNOSIS — Z79899 Other long term (current) drug therapy: Secondary | ICD-10-CM | POA: Diagnosis not present

## 2024-06-18 DIAGNOSIS — F17200 Nicotine dependence, unspecified, uncomplicated: Secondary | ICD-10-CM

## 2024-06-18 DIAGNOSIS — I10 Essential (primary) hypertension: Secondary | ICD-10-CM | POA: Insufficient documentation

## 2024-06-18 DIAGNOSIS — M25552 Pain in left hip: Secondary | ICD-10-CM | POA: Diagnosis present

## 2024-06-18 DIAGNOSIS — Z471 Aftercare following joint replacement surgery: Secondary | ICD-10-CM | POA: Diagnosis not present

## 2024-06-18 DIAGNOSIS — Z7982 Long term (current) use of aspirin: Secondary | ICD-10-CM | POA: Insufficient documentation

## 2024-06-18 HISTORY — PX: TOTAL HIP ARTHROPLASTY: SHX124

## 2024-06-18 SURGERY — ARTHROPLASTY, HIP, TOTAL, ANTERIOR APPROACH
Anesthesia: Monitor Anesthesia Care | Site: Hip | Laterality: Left

## 2024-06-18 MED ORDER — OXYCODONE HCL 5 MG PO TABS: 5.0000 mg | ORAL_TABLET | Freq: Once | ORAL | Status: DC | PRN

## 2024-06-18 MED ORDER — POLYETHYLENE GLYCOL 3350 17 G PO PACK
17.0000 g | PACK | Freq: Two times a day (BID) | ORAL | Status: DC
  Administered 2024-06-18 – 2024-06-19 (×2): 17 g via ORAL
  Filled 2024-06-18 (×2): qty 1

## 2024-06-18 MED ORDER — METHOCARBAMOL 500 MG PO TABS
ORAL_TABLET | ORAL | Status: AC
  Filled 2024-06-18: qty 1

## 2024-06-18 MED ORDER — SODIUM CHLORIDE (PF) 0.9 % IJ SOLN
INTRAMUSCULAR | Status: DC | PRN
  Administered 2024-06-18: 61 mL

## 2024-06-18 MED ORDER — MIDAZOLAM HCL 5 MG/5ML IJ SOLN
INTRAMUSCULAR | Status: DC | PRN
Start: 2024-06-18 — End: 2024-06-18
  Administered 2024-06-18 (×2): 1 mg via INTRAVENOUS

## 2024-06-18 MED ORDER — ACETAMINOPHEN 500 MG PO TABS
1000.0000 mg | ORAL_TABLET | Freq: Four times a day (QID) | ORAL | Status: DC
  Administered 2024-06-18 – 2024-06-19 (×3): 1000 mg via ORAL
  Filled 2024-06-18 (×3): qty 2

## 2024-06-18 MED ORDER — METOCLOPRAMIDE HCL 5 MG/ML IJ SOLN: 5.0000 mg | Freq: Three times a day (TID) | INTRAMUSCULAR | Status: DC | PRN

## 2024-06-18 MED ORDER — ONDANSETRON HCL 4 MG/2ML IJ SOLN: 4.0000 mg | Freq: Once | INTRAMUSCULAR | Status: DC | PRN

## 2024-06-18 MED ORDER — ORAL CARE MOUTH RINSE: 15.0000 mL | Freq: Once | OROMUCOSAL | Status: AC

## 2024-06-18 MED ORDER — MENTHOL 3 MG MT LOZG: 1.0000 | LOZENGE | OROMUCOSAL | Status: DC | PRN

## 2024-06-18 MED ORDER — ONDANSETRON HCL 4 MG/2ML IJ SOLN: 4.0000 mg | Freq: Four times a day (QID) | INTRAMUSCULAR | Status: DC | PRN

## 2024-06-18 MED ORDER — MIDAZOLAM HCL 2 MG/2ML IJ SOLN
INTRAMUSCULAR | Status: AC
  Filled 2024-06-18: qty 2

## 2024-06-18 MED ORDER — ALBUTEROL SULFATE (2.5 MG/3ML) 0.083% IN NEBU: 2.5000 mg | INHALATION_SOLUTION | Freq: Four times a day (QID) | RESPIRATORY_TRACT | Status: DC | PRN

## 2024-06-18 MED ORDER — DEXAMETHASONE SODIUM PHOSPHATE 10 MG/ML IJ SOLN
8.0000 mg | Freq: Once | INTRAMUSCULAR | Status: AC
  Administered 2024-06-18: 10 mg via INTRAVENOUS

## 2024-06-18 MED ORDER — CHLORHEXIDINE GLUCONATE 0.12 % MT SOLN
15.0000 mL | Freq: Once | OROMUCOSAL | Status: AC
  Administered 2024-06-18: 15 mL via OROMUCOSAL

## 2024-06-18 MED ORDER — CEFAZOLIN SODIUM-DEXTROSE 2-4 GM/100ML-% IV SOLN
2.0000 g | Freq: Four times a day (QID) | INTRAVENOUS | Status: AC
  Administered 2024-06-18 – 2024-06-19 (×2): 2 g via INTRAVENOUS
  Filled 2024-06-18 (×2): qty 100

## 2024-06-18 MED ORDER — PROPOFOL 500 MG/50ML IV EMUL
INTRAVENOUS | Status: DC | PRN
  Administered 2024-06-18: 50 ug/kg/min via INTRAVENOUS
  Administered 2024-06-18: 30 mg via INTRAVENOUS

## 2024-06-18 MED ORDER — DEXAMETHASONE SODIUM PHOSPHATE 10 MG/ML IJ SOLN
10.0000 mg | Freq: Once | INTRAMUSCULAR | Status: AC
  Administered 2024-06-19: 10 mg via INTRAVENOUS
  Filled 2024-06-18: qty 1

## 2024-06-18 MED ORDER — TRANEXAMIC ACID-NACL 1000-0.7 MG/100ML-% IV SOLN
1000.0000 mg | Freq: Once | INTRAVENOUS | Status: AC
  Administered 2024-06-18: 1000 mg via INTRAVENOUS
  Filled 2024-06-18: qty 100

## 2024-06-18 MED ORDER — FENTANYL CITRATE PF 50 MCG/ML IJ SOSY: 25.0000 ug | PREFILLED_SYRINGE | INTRAMUSCULAR | Status: DC | PRN

## 2024-06-18 MED ORDER — POVIDONE-IODINE 10 % EX SWAB: 2.0000 | Freq: Once | CUTANEOUS | Status: DC

## 2024-06-18 MED ORDER — STERILE WATER FOR IRRIGATION IR SOLN
Status: DC | PRN
  Administered 2024-06-18: 2000 mL

## 2024-06-18 MED ORDER — CEFAZOLIN SODIUM-DEXTROSE 2-4 GM/100ML-% IV SOLN
2.0000 g | INTRAVENOUS | Status: AC
Start: 2024-06-18 — End: 2024-06-18
  Administered 2024-06-18: 2 g via INTRAVENOUS
  Filled 2024-06-18: qty 100

## 2024-06-18 MED ORDER — BISACODYL 10 MG RE SUPP
10.0000 mg | Freq: Every day | RECTAL | Status: DC | PRN
Start: 2024-06-18 — End: 2024-06-19

## 2024-06-18 MED ORDER — METHOCARBAMOL 1000 MG/10ML IJ SOLN: 500.0000 mg | Freq: Four times a day (QID) | INTRAMUSCULAR | Status: DC | PRN

## 2024-06-18 MED ORDER — HYDROMORPHONE HCL 1 MG/ML IJ SOLN: 0.5000 mg | INTRAMUSCULAR | Status: DC | PRN

## 2024-06-18 MED ORDER — DEXAMETHASONE SODIUM PHOSPHATE 10 MG/ML IJ SOLN
INTRAMUSCULAR | Status: AC
  Filled 2024-06-18: qty 2

## 2024-06-18 MED ORDER — AMLODIPINE BESYLATE 5 MG PO TABS
5.0000 mg | ORAL_TABLET | Freq: Every day | ORAL | Status: DC
Start: 2024-06-19 — End: 2024-06-19
  Administered 2024-06-19: 5 mg via ORAL
  Filled 2024-06-18: qty 1

## 2024-06-18 MED ORDER — PHENYLEPHRINE HCL-NACL 20-0.9 MG/250ML-% IV SOLN
INTRAVENOUS | Status: AC
Start: 2024-06-18 — End: 2024-06-19
  Filled 2024-06-18: qty 250

## 2024-06-18 MED ORDER — KETOROLAC TROMETHAMINE 15 MG/ML IJ SOLN
7.5000 mg | Freq: Four times a day (QID) | INTRAMUSCULAR | Status: AC
  Administered 2024-06-18 – 2024-06-19 (×2): 7.5 mg via INTRAVENOUS
  Filled 2024-06-18 (×2): qty 1

## 2024-06-18 MED ORDER — ALUM & MAG HYDROXIDE-SIMETH 200-200-20 MG/5ML PO SUSP: 30.0000 mL | ORAL | Status: DC | PRN

## 2024-06-18 MED ORDER — METOCLOPRAMIDE HCL 5 MG PO TABS: 5.0000 mg | ORAL_TABLET | Freq: Three times a day (TID) | ORAL | Status: DC | PRN

## 2024-06-18 MED ORDER — BUPIVACAINE IN DEXTROSE 0.75-8.25 % IT SOLN
INTRATHECAL | Status: DC | PRN
  Administered 2024-06-18: 1.8 mL via INTRATHECAL

## 2024-06-18 MED ORDER — MEPERIDINE HCL 25 MG/ML IJ SOLN: 6.2500 mg | INTRAMUSCULAR | Status: DC | PRN

## 2024-06-18 MED ORDER — PROPOFOL 1000 MG/100ML IV EMUL
INTRAVENOUS | Status: AC
  Filled 2024-06-18: qty 300

## 2024-06-18 MED ORDER — ONDANSETRON HCL 4 MG/2ML IJ SOLN
INTRAMUSCULAR | Status: DC | PRN
  Administered 2024-06-18: 4 mg via INTRAVENOUS

## 2024-06-18 MED ORDER — METHOCARBAMOL 500 MG PO TABS
500.0000 mg | ORAL_TABLET | Freq: Four times a day (QID) | ORAL | Status: DC | PRN
  Administered 2024-06-18: 500 mg via ORAL

## 2024-06-18 MED ORDER — OXYCODONE HCL 5 MG/5ML PO SOLN: 5.0000 mg | Freq: Once | ORAL | Status: DC | PRN

## 2024-06-18 MED ORDER — SENNA 8.6 MG PO TABS
2.0000 | ORAL_TABLET | Freq: Every day | ORAL | Status: DC
  Administered 2024-06-18: 17.2 mg via ORAL
  Filled 2024-06-18: qty 2

## 2024-06-18 MED ORDER — PHENYLEPHRINE 80 MCG/ML (10ML) SYRINGE FOR IV PUSH (FOR BLOOD PRESSURE SUPPORT)
PREFILLED_SYRINGE | INTRAVENOUS | Status: AC
  Filled 2024-06-18: qty 10

## 2024-06-18 MED ORDER — ACETAMINOPHEN 500 MG PO TABS
1000.0000 mg | ORAL_TABLET | Freq: Once | ORAL | Status: AC
  Administered 2024-06-18: 1000 mg via ORAL
  Filled 2024-06-18: qty 2

## 2024-06-18 MED ORDER — PHENOL 1.4 % MT LIQD
1.0000 | OROMUCOSAL | Status: DC | PRN
Start: 2024-06-18 — End: 2024-06-19

## 2024-06-18 MED ORDER — OXYCODONE HCL 5 MG PO TABS
5.0000 mg | ORAL_TABLET | ORAL | Status: DC | PRN
  Administered 2024-06-19: 5 mg via ORAL
  Filled 2024-06-18: qty 1

## 2024-06-18 MED ORDER — SUCCINYLCHOLINE CHLORIDE 200 MG/10ML IV SOSY
PREFILLED_SYRINGE | INTRAVENOUS | Status: AC
Start: 2024-06-18 — End: 2024-06-18
  Filled 2024-06-18: qty 10

## 2024-06-18 MED ORDER — GLYCOPYRROLATE 0.2 MG/ML IJ SOLN
INTRAMUSCULAR | Status: AC
  Filled 2024-06-18: qty 1

## 2024-06-18 MED ORDER — DIPHENHYDRAMINE HCL 12.5 MG/5ML PO ELIX: 12.5000 mg | ORAL_SOLUTION | ORAL | Status: DC | PRN

## 2024-06-18 MED ORDER — FENTANYL CITRATE (PF) 100 MCG/2ML IJ SOLN
INTRAMUSCULAR | Status: AC
Start: 2024-06-18 — End: 2024-06-18
  Filled 2024-06-18: qty 2

## 2024-06-18 MED ORDER — LACTATED RINGERS IV SOLN: INTRAVENOUS | Status: DC

## 2024-06-18 MED ORDER — 0.9 % SODIUM CHLORIDE (POUR BTL) OPTIME
TOPICAL | Status: DC | PRN
  Administered 2024-06-18: 1000 mL

## 2024-06-18 MED ORDER — ROCURONIUM BROMIDE 10 MG/ML (PF) SYRINGE
PREFILLED_SYRINGE | INTRAVENOUS | Status: AC
  Filled 2024-06-18: qty 10

## 2024-06-18 MED ORDER — ONDANSETRON HCL 4 MG PO TABS: 4.0000 mg | ORAL_TABLET | Freq: Four times a day (QID) | ORAL | Status: DC | PRN

## 2024-06-18 MED ORDER — KETOROLAC TROMETHAMINE 30 MG/ML IJ SOLN
INTRAMUSCULAR | Status: AC
  Filled 2024-06-18: qty 1

## 2024-06-18 MED ORDER — KETOROLAC TROMETHAMINE 15 MG/ML IJ SOLN
INTRAMUSCULAR | Status: AC
  Filled 2024-06-18: qty 1

## 2024-06-18 MED ORDER — LIDOCAINE HCL (PF) 2 % IJ SOLN
INTRAMUSCULAR | Status: AC
  Filled 2024-06-18: qty 15

## 2024-06-18 MED ORDER — GLYCOPYRROLATE 0.2 MG/ML IJ SOLN
INTRAMUSCULAR | Status: DC | PRN
  Administered 2024-06-18: .2 mg via INTRAVENOUS

## 2024-06-18 MED ORDER — TRANEXAMIC ACID-NACL 1000-0.7 MG/100ML-% IV SOLN
1000.0000 mg | INTRAVENOUS | Status: AC
Start: 2024-06-18 — End: 2024-06-18
  Administered 2024-06-18: 1000 mg via INTRAVENOUS
  Filled 2024-06-18: qty 100

## 2024-06-18 MED ORDER — SODIUM CHLORIDE (PF) 0.9 % IJ SOLN
INTRAMUSCULAR | Status: AC
  Filled 2024-06-18: qty 30

## 2024-06-18 MED ORDER — PHENYLEPHRINE HCL-NACL 20-0.9 MG/250ML-% IV SOLN
INTRAVENOUS | Status: DC | PRN
Start: 2024-06-18 — End: 2024-06-18
  Administered 2024-06-18: 50 ug/min via INTRAVENOUS

## 2024-06-18 MED ORDER — ONDANSETRON HCL 4 MG/2ML IJ SOLN
INTRAMUSCULAR | Status: AC
  Filled 2024-06-18: qty 4

## 2024-06-18 MED ORDER — TRAMADOL HCL 50 MG PO TABS: 50.0000 mg | ORAL_TABLET | Freq: Four times a day (QID) | ORAL | Status: DC | PRN

## 2024-06-18 MED ORDER — SODIUM CHLORIDE 0.9 % IV SOLN: INTRAVENOUS | Status: DC

## 2024-06-18 MED ORDER — BUPIVACAINE-EPINEPHRINE (PF) 0.25% -1:200000 IJ SOLN
INTRAMUSCULAR | Status: AC
  Filled 2024-06-18: qty 30

## 2024-06-18 MED ORDER — ASPIRIN 81 MG PO CHEW
81.0000 mg | CHEWABLE_TABLET | Freq: Two times a day (BID) | ORAL | Status: DC
  Administered 2024-06-18 – 2024-06-19 (×2): 81 mg via ORAL
  Filled 2024-06-18 (×2): qty 1

## 2024-06-18 MED ORDER — PHENYLEPHRINE 80 MCG/ML (10ML) SYRINGE FOR IV PUSH (FOR BLOOD PRESSURE SUPPORT)
PREFILLED_SYRINGE | INTRAVENOUS | Status: DC | PRN
Start: 2024-06-18 — End: 2024-06-18
  Administered 2024-06-18: 80 ug via INTRAVENOUS

## 2024-06-18 SURGICAL SUPPLY — 38 items
BAG COUNTER SPONGE SURGICOUNT (BAG) IMPLANT
BAG ZIPLOCK 12X15 (MISCELLANEOUS) ×1 IMPLANT
BLADE SAG 18X100X1.27 (BLADE) ×1 IMPLANT
COVER PERINEAL POST (MISCELLANEOUS) ×1 IMPLANT
COVER SURGICAL LIGHT HANDLE (MISCELLANEOUS) ×1 IMPLANT
CUP ACETBLR 52 OD PINNACLE (Hips) IMPLANT
DERMABOND ADVANCED .7 DNX12 (GAUZE/BANDAGES/DRESSINGS) ×1 IMPLANT
DRAPE FOOT SWITCH (DRAPES) ×1 IMPLANT
DRAPE STERI IOBAN 125X83 (DRAPES) ×1 IMPLANT
DRAPE U-SHAPE 47X51 STRL (DRAPES) ×2 IMPLANT
DRESSING AQUACEL AG SP 3.5X10 (GAUZE/BANDAGES/DRESSINGS) ×1 IMPLANT
DRSG AQUACEL AG ADV 3.5X10 (GAUZE/BANDAGES/DRESSINGS) IMPLANT
DURAPREP 26ML APPLICATOR (WOUND CARE) ×1 IMPLANT
ELECT REM PT RETURN 15FT ADLT (MISCELLANEOUS) ×1 IMPLANT
FEMORAL STEM 12/14 TPR SZ4 HIP (Orthopedic Implant) IMPLANT
GLOVE BIO SURGEON STRL SZ 6 (GLOVE) ×1 IMPLANT
GLOVE BIOGEL PI IND STRL 6.5 (GLOVE) ×1 IMPLANT
GLOVE BIOGEL PI IND STRL 7.5 (GLOVE) ×1 IMPLANT
GLOVE ORTHO TXT STRL SZ7.5 (GLOVE) ×2 IMPLANT
GOWN STRL REUS W/ TWL LRG LVL3 (GOWN DISPOSABLE) ×2 IMPLANT
HEAD CERAMIC DELTA 36 PLUS 1.5 (Hips) IMPLANT
HOLDER FOLEY CATH W/STRAP (MISCELLANEOUS) ×1 IMPLANT
KIT TURNOVER KIT A (KITS) ×1 IMPLANT
LINER NEUTRAL 52X36MM PLUS 4 (Liner) IMPLANT
MANIFOLD NEPTUNE II (INSTRUMENTS) ×1 IMPLANT
NDL SAFETY ECLIPSE 18X1.5 (NEEDLE) ×1 IMPLANT
PACK ANTERIOR HIP CUSTOM (KITS) ×1 IMPLANT
PENCIL SMOKE EVACUATOR (MISCELLANEOUS) ×1 IMPLANT
SCREW 6.5MMX25MM (Screw) IMPLANT
SUT MNCRL AB 4-0 PS2 18 (SUTURE) ×1 IMPLANT
SUT VIC AB 1 CT1 36 (SUTURE) ×3 IMPLANT
SUT VIC AB 2-0 CT1 TAPERPNT 27 (SUTURE) ×2 IMPLANT
SUTURE STRATFX 0 PDS 27 VIOLET (SUTURE) ×1 IMPLANT
SYR 3ML LL SCALE MARK (SYRINGE) ×1 IMPLANT
TOWEL GREEN STERILE FF (TOWEL DISPOSABLE) ×1 IMPLANT
TRAY FOLEY MTR SLVR 16FR STAT (SET/KITS/TRAYS/PACK) ×1 IMPLANT
TUBE SUCTION HIGH CAP CLEAR NV (SUCTIONS) ×1 IMPLANT
WATER STERILE IRR 1000ML POUR (IV SOLUTION) ×1 IMPLANT

## 2024-06-18 NOTE — Transfer of Care (Signed)
 Immediate Anesthesia Transfer of Care Note  Patient: Debra Woodward  Procedure(s) Performed: ARTHROPLASTY, HIP, TOTAL, ANTERIOR APPROACH (Left: Hip)  Patient Location: PACU  Anesthesia Type:Spinal  Level of Consciousness: awake, alert , and oriented  Airway & Oxygen Therapy: Patient Spontanous Breathing and Patient connected to face mask oxygen  Post-op Assessment: Report given to RN and Post -op Vital signs reviewed and stable  Post vital signs: Reviewed and stable  Last Vitals:  Vitals Value Taken Time  BP 89/60 06/18/24 14:52  Temp    Pulse 62 06/18/24 14:56  Resp 13 06/18/24 14:56  SpO2 85 % 06/18/24 14:56  Vitals shown include unfiled device data.  Last Pain:  Vitals:   06/18/24 1122  TempSrc:   PainSc: 7       Patients Stated Pain Goal: 6 (06/18/24 1122)  Complications: No notable events documented.

## 2024-06-18 NOTE — Anesthesia Preprocedure Evaluation (Addendum)
 Anesthesia Evaluation  Patient identified by MRN, date of birth, ID band Patient awake    Reviewed: Allergy & Precautions, H&P , NPO status , Patient's Chart, lab work & pertinent test results  History of Anesthesia Complications (+) PONV and history of anesthetic complications  Airway Mallampati: II  TM Distance: >3 FB Neck ROM: Full    Dental no notable dental hx. (+) Teeth Intact, Dental Advisory Given, Caps   Pulmonary neg pulmonary ROS, asthma , Current Smoker   Pulmonary exam normal breath sounds clear to auscultation       Cardiovascular Exercise Tolerance: Good hypertension, Pt. on medications negative cardio ROS Normal cardiovascular exam Rhythm:Regular Rate:Normal     Neuro/Psych   Anxiety     negative neurological ROS  negative psych ROS   GI/Hepatic negative GI ROS, Neg liver ROS,,,  Endo/Other  negative endocrine ROS    Renal/GU negative Renal ROS  negative genitourinary   Musculoskeletal negative musculoskeletal ROS (+) Arthritis ,    Abdominal   Peds negative pediatric ROS (+)  Hematology negative hematology ROS (+)   Anesthesia Other Findings   Reproductive/Obstetrics negative OB ROS                              Anesthesia Physical Anesthesia Plan  ASA: 3  Anesthesia Plan: MAC and Spinal   Post-op Pain Management: Minimal or no pain anticipated   Induction: Intravenous  PONV Risk Score and Plan: 2 and Ondansetron , Dexamethasone  and Propofol  infusion  Airway Management Planned: Simple Face Mask, Natural Airway and Nasal Cannula  Additional Equipment: None  Intra-op Plan:   Post-operative Plan:   Informed Consent: I have reviewed the patients History and Physical, chart, labs and discussed the procedure including the risks, benefits and alternatives for the proposed anesthesia with the patient or authorized representative who has indicated his/her  understanding and acceptance.       Plan Discussed with: Anesthesiologist and CRNA  Anesthesia Plan Comments: (  )         Anesthesia Quick Evaluation

## 2024-06-18 NOTE — Op Note (Signed)
 NAME:  Debra Woodward NO.: [id number]      MEDICAL RECORD NO.: [id number]      FACILITY:  [facility]      PHYSICIAN:  [name]  DATE OF BIRTH:  05-May-1951     DATE OF PROCEDURE:  06/18/2024                                 OPERATIVE REPORT         PREOPERATIVE DIAGNOSIS: Left  hip osteoarthritis.      POSTOPERATIVE DIAGNOSIS:  Left hip osteoarthritis.      PROCEDURE:  Left total hip replacement through an anterior approach   utilizing DePuy THR system, component size 52 mm pinnacle cup, a size 36+4 neutral   Altrex liner, a size 4 Hi Actis stem with a 36+1.5 delta ceramic   ball.      SURGEON:  Donnice JONETTA. Car, M.D.      ASSISTANT:  Randine Ricks, PA-C     ANESTHESIA:  Spinal.      SPECIMENS:  None.      COMPLICATIONS:  None.      BLOOD LOSS:  500 cc     DRAINS:  None.      INDICATION OF THE PROCEDURE:  Debra Woodward is a 73 y.o. female who had   presented to office for evaluation of left hip pain.  Radiographs revealed   progressive degenerative changes with bone-on-bone   articulation of the  hip joint, including subchondral cystic changes and osteophytes.  The patient had painful limited range of   motion significantly affecting their overall quality of life and function.  The patient was failing to    respond to conservative measures including medications and/or injections and activity modification and at this point was ready   to proceed with more definitive measures.  Consent was obtained for   benefit of pain relief.  Specific risks of infection, DVT, component   failure, dislocation, neurovascular injury, and need for revision surgery were reviewed in the office.     PROCEDURE IN DETAIL:  The patient was brought to operative theater.   Once adequate anesthesia, preoperative antibiotics, 2 gm of Ancef , 1 gm of Tranexamic Acid , and 10 mg of Decadron  were administered, the patient was positioned supine on the [employer]  table.  Once the patient was safely positioned with adequate padding of boney prominences we predraped out the hip, and used fluoroscopy to confirm orientation of the pelvis.      The left hip was then prepped and draped from proximal iliac crest to   mid thigh with a shower curtain technique.      Time-out was performed identifying the patient, planned procedure, and the appropriate extremity.     An incision was then made 2 cm lateral to the   anterior superior iliac spine extending over the orientation of the   tensor fascia lata muscle and sharp dissection was carried down to the   fascia of the muscle.      The fascia was then incised.  The muscle belly was identified and swept   laterally and retractor placed along the superior neck.  Following   cauterization of the circumflex vessels and removing some pericapsular   fat, a second cobra retractor was placed on the inferior neck.  A T-capsulotomy was made along the line of the   superior neck to the trochanteric fossa, then extended proximally and   distally.  Tag sutures were placed and the retractors were then placed   intracapsular.  We then identified the trochanteric fossa and   orientation of my neck cut and then made a neck osteotomy with the femur on traction.  The femoral   head was removed without difficulty or complication.  Traction was let   off and retractors were placed posterior and anterior around the   acetabulum.      The labrum and foveal tissue were debrided.  I began reaming with a 45 mm   reamer and reamed up to 51 mm reamer with good bony bed preparation and a 52 mm  cup was chosen.  The final 52 mm Pinnacle cup was then impacted under fluoroscopy to confirm the depth of penetration and orientation with respect to   Abduction and forward flexion.  A screw was placed into the ilium followed by the hole eliminator.  The final   36+4 neutral Altrex liner was impacted with good visualized rim fit.  The cup was  positioned anatomically within the acetabular portion of the pelvis.      At this point, the femur was rolled to 100 degrees.  Further capsule was   released off the inferior aspect of the femoral neck.  I then   released the superior capsule proximally.  With the leg in a neutral position the hook was placed laterally   along the femur under the vastus lateralis origin and elevated manually and then held in position using the hook attachment on the bed.  The leg was then extended and adducted with the leg rolled to 100   degrees of external rotation.  Retractors were placed along the medial calcar and posteriorly over the greater trochanter.  Once the proximal femur was fully   exposed, I used a box osteotome to set orientation.  I then began   broaching with the starting chili pepper broach and passed this by hand and then broached up to 4.  With the 4 broach in place I chose a high offset neck and did several trial reductions.  The offset was appropriate, leg lengths   appeared to be equal best matched with the +1.5 head ball trial confirmed radiographically.   Given these findings, I went ahead and dislocated the hip, repositioned all   retractors and positioned the right hip in the extended and abducted position.  The final 4 Hi Actis stem was   chosen and it was impacted down to the level of neck cut.  Based on this   and the trial reductions, a final 36+1.5 delta ceramic ball was chosen and   impacted onto a clean and dry trunnion, and the hip was reduced.  The   hip had been irrigated throughout the case again at this point.  I did   reapproximate the superior capsular leaflet to the anterior leaflet   using #1 Vicryl.  The fascia of the   tensor fascia lata muscle was then reapproximated using #1 Vicryl and #0 Stratafix sutures.  The   remaining wound was closed with 2-0 Vicryl and running 4-0 Monocryl.   The hip was cleaned, dried, and dressed sterilely using Dermabond and   Aquacel  dressing.  The patient was then brought   to recovery room in stable condition tolerating the procedure well.    Randine Ricks, PA-C  was present for the entirety of the case involved from   preoperative positioning, perioperative retractor management, general   facilitation of the case, as well as primary wound closure as assistant.            Donnice CORDOBA Ernie, M.D.        06/18/2024 11:59 AM

## 2024-06-18 NOTE — Anesthesia Procedure Notes (Signed)
 Spinal  Patient location during procedure: OR Start time: 06/18/2024 1:10 PM End time: 06/18/2024 1:17 PM Reason for block: surgical anesthesia Staffing Anesthesiologist: Mallory Manus, MD Performed by: Mallory Manus, MD Authorized by: Mallory Manus, MD   Preanesthetic Checklist Completed: patient identified, IV checked, site marked, risks and benefits discussed, surgical consent, monitors and equipment checked, pre-op evaluation and timeout performed Spinal Block Patient position: sitting Prep: DuraPrep Patient monitoring: heart rate, cardiac monitor, continuous pulse ox and blood pressure Approach: midline Location: L3-4 Injection technique: single-shot Needle Needle type: Sprotte  Needle gauge: 24 G Needle length: 9 cm Assessment Sensory level: T4 Events: CSF return Additional Notes Multiple attempts at several levels secondary to spondylosis.

## 2024-06-18 NOTE — Anesthesia Postprocedure Evaluation (Signed)
 Anesthesia Post Note  Patient: Special educational needs teacher  Procedure(s) Performed: ARTHROPLASTY, HIP, TOTAL, ANTERIOR APPROACH (Left: Hip)     Patient location during evaluation: PACU Anesthesia Type: MAC, Regional and Spinal Level of consciousness: oriented and awake and alert Pain management: pain level controlled Vital Signs Assessment: post-procedure vital signs reviewed and stable Respiratory status: spontaneous breathing, respiratory function stable and patient connected to nasal cannula oxygen Cardiovascular status: blood pressure returned to baseline and stable Postop Assessment: no headache, no backache and no apparent nausea or vomiting Anesthetic complications: no   No notable events documented.  Last Vitals:  Vitals:   06/18/24 1104 06/18/24 1452  BP: (!) 164/89   Pulse: 76 65  Resp: 18 12  Temp: 36.7 C   SpO2: 96% 99%    Last Pain:  Vitals:   06/18/24 1122  TempSrc:   PainSc: 7                  Danice Dippolito

## 2024-06-18 NOTE — Discharge Instructions (Signed)
 Dr. Donnice Car Total Joint Specialist [facility] [address] [place], [place] [address] [phone]  Pain Regimen Instructions:  - Take tylenol  1000 mg every 6-8 hours around the clock  - Take the muscle relaxant around the clock  - Then take 5 mg (1 tablet) of oxycodone  every 4 hours as needed for severe pain  - Ice and elevate your leg as often as you can  Do not take over the counter pain medication until instructed by us .  If this is not controlling your pain, or you need refills - call our office at [phone] or send a message via the [employer] portal.   Take the stool softeners provided until you are having regular bowel movements, and then you may discontinue.    INSTRUCTIONS AFTER JOINT REPLACEMENT   Remove items at home which could result in a fall. This includes throw rugs or furniture in walking pathways ICE to the affected joint every three hours while awake for 30 minutes at a time, for at least the first 3-5 days, and then as needed for pain and swelling.  Continue to use ice for pain and swelling. You may notice swelling that will progress down to the foot and ankle.  This is normal after surgery.  Elevate your leg when you are not up walking on it.   Continue to use the breathing machine you got in the hospital (incentive spirometer) which will help keep your temperature down.  It is common for your temperature to cycle up and down following surgery, especially at night when you are not up moving around and exerting yourself.  The breathing machine keeps your lungs expanded and your temperature down.   DIET:  As you were doing prior to hospitalization, we recommend a well-balanced diet.  DRESSING / WOUND CARE / SHOWERING  Keep the surgical dressing until follow up.  The dressing is water  proof, so you can shower without any extra covering.  IF THE DRESSING FALLS OFF or the wound gets wet inside, change the dressing with sterile  gauze.  Please use good hand washing techniques before changing the dressing.  Do not use any lotions or creams on the incision until instructed by your surgeon.    ACTIVITY  Increase activity slowly as tolerated, but follow the weight bearing instructions below.   No driving for 6 weeks or until further direction given by your physician.  You cannot drive while taking narcotics.  No lifting or carrying greater than 10 lbs. until further directed by your surgeon. Avoid periods of inactivity such as sitting longer than an hour when not asleep. This helps prevent blood clots.  You may return to work once you are authorized by your doctor.     WEIGHT BEARING   Weight bearing as tolerated with assist device (walker, cane, etc) as directed, use it as long as suggested by your surgeon or therapist, typically at least 4-6 weeks.   EXERCISES  Results after joint replacement surgery are often greatly improved when you follow the exercise, range of motion and muscle strengthening exercises prescribed by your doctor. Safety measures are also important to protect the joint from further injury. Any time any of these exercises cause you to have increased pain or swelling, decrease what you are doing until you are comfortable again and then slowly increase them. If you have problems or questions, call your caregiver or physical therapist for advice.   Rehabilitation is important following a joint replacement. After  just a few days of immobilization, the muscles of the leg can become weakened and shrink (atrophy).  These exercises are designed to build up the tone and strength of the thigh and leg muscles and to improve motion. Often times heat used for twenty to thirty minutes before working out will loosen up your tissues and help with improving the range of motion but do not use heat for the first two weeks following surgery (sometimes heat can increase post-operative swelling).   These exercises can be  done on a training (exercise) mat, on the floor, on a table or on a bed. Use whatever works the best and is most comfortable for you.    Use music or television while you are exercising so that the exercises are a pleasant break in your day. This will make your life better with the exercises acting as a break in your routine that you can look forward to.   Perform all exercises about fifteen times, three times per day or as directed.  You should exercise both the operative leg and the other leg as well.  Exercises include:   Quad Sets - Tighten up the muscle on the front of the thigh (Quad) and hold for 5-10 seconds.   Straight Leg Raises - With your knee straight (if you were given a brace, keep it on), lift the leg to 60 degrees, hold for 3 seconds, and slowly lower the leg.  Perform this exercise against resistance later as your leg gets stronger.  Leg Slides: Lying on your back, slowly slide your foot toward your buttocks, bending your knee up off the floor (only go as far as is comfortable). Then slowly slide your foot back down until your leg is flat on the floor again.  Angel Wings: Lying on your back spread your legs to the side as far apart as you can without causing discomfort.  Hamstring Strength:  Lying on your back, push your heel against the floor with your leg straight by tightening up the muscles of your buttocks.  Repeat, but this time bend your knee to a comfortable angle, and push your heel against the floor.  You may put a pillow under the heel to make it more comfortable if necessary.   A rehabilitation program following joint replacement surgery can speed recovery and prevent re-injury in the future due to weakened muscles. Contact your doctor or a physical therapist for more information on knee rehabilitation.    CONSTIPATION  Constipation is defined medically as fewer than three stools per week and severe constipation as less than one stool per week.  Even if you have a regular  bowel pattern at home, your normal regimen is likely to be disrupted due to multiple reasons following surgery.  Combination of anesthesia, postoperative narcotics, change in appetite and fluid intake all can affect your bowels.   YOU MUST use at least one of the following options; they are listed in order of increasing strength to get the job done.  They are all available over the counter, and you may need to use some, POSSIBLY even all of these options:    Drink plenty of fluids (prune juice may be helpful) and high fiber foods Colace 100 mg by mouth twice a day  Senokot for constipation as directed and as needed Dulcolax (bisacodyl ), take with full glass of water   Miralax  (polyethylene glycol) once or twice a day as needed.  If you have tried all these things and are unable to  have a bowel movement in the first 3-4 days after surgery call either your surgeon or your primary doctor.    If you experience loose stools or diarrhea, hold the medications until you stool forms back up.  If your symptoms do not get better within 1 week or if they get worse, check with your doctor.  If you experience the worst abdominal pain ever or develop nausea or vomiting, please contact the office immediately for further recommendations for treatment.   ITCHING:  If you experience itching with your medications, try taking only a single pain pill, or even half a pain pill at a time.  You can also use Benadryl  over the counter for itching or also to help with sleep.   TED HOSE STOCKINGS:  Use stockings on both legs until for at least 2 weeks or as directed by physician office. They may be removed at night for sleeping.  MEDICATIONS:  See your medication summary on the "After Visit Summary" that nursing will review with you.  You may have some home medications which will be placed on hold until you complete the course of blood thinner medication.  It is important for you to complete the blood thinner medication as  prescribed.  PRECAUTIONS:  If you experience chest pain or shortness of breath - call 911 immediately for transfer to the hospital emergency department.   If you develop a fever greater that 101 F, purulent drainage from wound, increased redness or drainage from wound, foul odor from the wound/dressing, or calf pain - CONTACT YOUR SURGEON.                                                   FOLLOW-UP APPOINTMENTS:  If you do not already have a post-op appointment, please call the office for an appointment to be seen by your surgeon.  Guidelines for how soon to be seen are listed in your "After Visit Summary", but are typically between 1-4 weeks after surgery.  OTHER INSTRUCTIONS:   Knee Replacement:  Do not place pillow under knee, focus on keeping the knee straight while resting. CPM instructions: 0-90 degrees, 2 hours in the morning, 2 hours in the afternoon, and 2 hours in the evening. Place foam block, curve side up under heel at all times except when in CPM or when walking.  DO NOT modify, tear, cut, or change the foam block in any way.  POST-OPERATIVE OPIOID TAPER INSTRUCTIONS: It is important to wean off of your opioid medication as soon as possible. If you do not need pain medication after your surgery it is ok to stop day one. Opioids include: Codeine, Hydrocodone(Norco, Vicodin), Oxycodone (Percocet, oxycontin ) and hydromorphone  amongst others.  Long term and even short term use of opiods can cause: Increased pain response Dependence Constipation Depression Respiratory depression And more.  Withdrawal symptoms can include Flu like symptoms Nausea, vomiting And more Techniques to manage these symptoms Hydrate well Eat regular healthy meals Stay active Use relaxation techniques(deep breathing, meditating, yoga) Do Not substitute Alcohol to help with tapering If you have been on opioids for less than two weeks and do not have pain than it is ok to stop all together.  Plan to  wean off of opioids This plan should start within one week post op of your joint replacement. Maintain the same interval or time between  taking each dose and first decrease the dose.  Cut the total daily intake of opioids by one tablet each day Next start to increase the time between doses. The last dose that should be eliminated is the evening dose.   MAKE SURE YOU:  Understand these instructions.  Get help right away if you are not doing well or get worse.    Thank you for letting us  be a part of your medical care team.  It is a privilege we respect greatly.  We hope these instructions will help you stay on track for a fast and full recovery!

## 2024-06-18 NOTE — Interval H&P Note (Signed)
 History and Physical Interval Note:  06/18/2024 11:07 AM  Debra Woodward  has presented today for surgery, with the diagnosis of Left hip osteoarthritis.  The various methods of treatment have been discussed with the patient and family. After consideration of risks, benefits and other options for treatment, the patient has consented to  Procedure(s): ARTHROPLASTY, HIP, TOTAL, ANTERIOR APPROACH (Left) as a surgical intervention.  The patient's history has been reviewed, patient examined, no change in status, stable for surgery.  I have reviewed the patient's chart and labs.  Questions were answered to the patient's satisfaction.     [name]

## 2024-06-18 NOTE — Plan of Care (Signed)

## 2024-06-18 NOTE — H&P (Signed)
 TOTAL HIP ADMISSION H&P  Patient is admitted for left total hip arthroplasty.  Therapy Plans: HEP Disposition: Home with daughter Planned DVT Prophylaxis: aspirin  81mg  BID DME needed: walker PCP: Dr. Charlett - clearance received TXA: IV Allergies: prednisone , epinephrine , bee venom Anesthesia Concerns: wants room temperature ginger ale immediately upon waking up BMI: 32.3 Last HgbA1c: Not diabetic   Other: - history of right TKA 14 years ago with Dr. Ernie - did well - tylenol , celebrex , tramadol /oxycodone , robaxin  -- wants sent to [facility] - No hx of VTE or cancer   Subjective:  Chief Complaint: left hip pain  HPI: Debra Woodward, 73 y.o. female, has a history of pain and functional disability in the left hip(s) due to arthritis and patient has failed non-surgical conservative treatments for greater than 12 weeks to include NSAID's and/or analgesics and activity modification.  Onset of symptoms was gradual starting 2 years ago with gradually worsening course since that time.The patient noted no past surgery on the left hip(s).  Patient currently rates pain in the left hip at 8 out of 10 with activity. Patient has worsening of pain with activity and weight bearing, pain that interfers with activities of daily living, and pain with passive range of motion. Patient has evidence of joint space narrowing by imaging studies. This condition presents safety issues increasing the risk of falls.   There is no current active infection.  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 12/17/2021   Excessive cerumen in both ear canals 12/17/2021   Sensorineural hearing loss (SNHL), bilateral 12/17/2021   Arthritis of both knees 11/15/2017   Hx of adenomatous colonic polyps 07/28/2015   Degenerative disc disease, lumbar sacral 11/13/2012   Elevated blood pressure  reading 11/13/2012   Allergic rhinitis 09/14/2006   Past Medical History:  Diagnosis Date   ALLERGIC RHINITIS 09/14/2006   Allergy    Anxiety    Arthralgia of left ankle 04/20/2021   Arthritis    Asthma    Fibroid    Hx of adenomatous colonic polyps 07/28/2015   Hypertension    Impacted cerumen of left ear 04/14/2022   PONV (postoperative nausea and vomiting)    Pre-diabetes    Seasonal allergies     Past Surgical History:  Procedure Laterality Date   ABDOMINAL HYSTERECTOMY  1996   BREAST EXCISIONAL BIOPSY Left 1983   No scar seen   BREAST EXCISIONAL BIOPSY Right 1987   NO scar seen   BREAST SURGERY Bilateral 1983, 1996   benign   CARPAL TUNNEL RELEASE Right 2008   COLONOSCOPY  07/23/2015   colonoscopy  11/21/2023   q52yrs   CYSTECTOMY Bilateral 1983, 1988   breast; benign   KNEE ARTHROSCOPY Right 2010   TOE SURGERY Bilateral    TOTAL KNEE ARTHROPLASTY Right 2011   TUBAL LIGATION  1996    No current facility-administered medications for this encounter.   Current Outpatient Medications  Medication Sig Dispense Refill Last Dose/Taking   albuterol  (VENTOLIN  HFA) 108 (90 Base) MCG/ACT inhaler Inhale 1-2 puffs into the lungs every 6 (six) hours as needed for wheezing or shortness of breath. 3 each 1 Taking As Needed   amLODipine  (NORVASC ) 5 MG tablet TAKE 1 TABLET EVERY DAY 90 tablet 3 Taking   Ascorbic Acid (VITAMIN C) 500 MG CAPS Take 500 mg by mouth daily.   Taking   aspirin  81  MG EC tablet Take 81 mg by mouth daily. 30 tablet 12 Taking   cetirizine  (ZYRTEC ) 10 MG tablet Take 10 mg by mouth daily as needed for allergies.   Taking As Needed   Cholecalciferol (VITAMIN D -3) 125 MCG (5000 UT) TABS Take 5,000 Units by mouth every other day.   Taking   CINNAMON PO Take 1 capsule by mouth daily.   Taking   desonide  (DESOWEN ) 0.05 % cream Apply 1 Application topically daily as needed (irritation).   Taking As Needed   diclofenac Sodium (VOLTAREN) 1 % GEL Apply 2 g topically  daily as needed (pain).   Taking As Needed   EPINEPHrine  (EPIPEN  2-PAK) 0.3 mg/0.3 mL IJ SOAJ injection Inject 0.3 mg into the muscle as needed for anaphylaxis. 1 each 1 Taking As Needed   fexofenadine (ALLEGRA) 180 MG tablet Take 180 mg by mouth daily as needed for allergies.   Taking As Needed   fluticasone  (FLONASE ) 50 MCG/ACT nasal spray USE 2 SPRAY(S) IN EACH NOSTRIL ONCE DAILY AT  NIGHT (Patient taking differently: Place 2 sprays into both nostrils daily as needed for allergies.) 16 g 6 Taking Differently   ipratropium (ATROVENT) 0.06 % nasal spray Place 2 sprays into both nostrils in the morning and at bedtime.   Taking   lidocaine  (LIDODERM ) 5 % Place 1 patch onto the skin daily. Remove & Discard patch within 12 hours or as directed by MD (Patient taking differently: Place 1 patch onto the skin daily as needed (pain). Remove & Discard patch within 12 hours or as directed by MD) 30 patch 0 Taking Differently   Multiple Vitamin (MULTIVITAMIN) tablet Take 1 tablet by mouth daily.   Taking   ondansetron  (ZOFRAN ) 4 MG tablet Take 4 mg by mouth every 8 (eight) hours as needed for vomiting or nausea.   Taking As Needed   cyclobenzaprine  (FLEXERIL ) 10 MG tablet Take 1 tablet (10 mg total) by mouth 2 (two) times daily as needed for muscle spasms. (Patient not taking: No sig reported) 20 tablet 0    docusate sodium  (COLACE) 100 MG capsule Take 1 capsule (100 mg total) by mouth every 12 (twelve) hours. (Patient not taking: Reported on 05/31/2024) 30 capsule 0 Not Taking   triamcinolone  ointment (KENALOG ) 0.1 % Apply small amt twice per day x 14 days then twice weekly (Patient not taking: No sig reported) 30 g 1    Allergies  Allergen Reactions   Bee Venom Hives   Prednisone  Other (See Comments)    hallucinations   Beef-Derived Drug Products     Doesn't eat any type of red meat    Social History   Tobacco Use   Smoking status: Every Day    Current packs/day: 0.35    Types: Cigarettes   Smokeless  tobacco: Never  Substance Use Topics   Alcohol use: Not Currently    Comment: social    Family History  Problem Relation Age of Onset   Stroke Mother    Diabetes Mother    Diabetes Father    Colon polyps Sister    Hypertension Sister    Cancer Sister    Esophageal cancer Paternal Grandfather 23   Colon cancer Neg Hx    Stomach cancer Neg Hx    Rectal cancer Neg Hx    Breast cancer Neg Hx    BRCA 1/2 Neg Hx      Review of Systems  Constitutional:  Negative for chills and fever.  Respiratory:  Negative for  cough and shortness of breath.   Cardiovascular:  Negative for chest pain.  Gastrointestinal:  Negative for nausea and vomiting.  Musculoskeletal:  Positive for arthralgias.     Objective:  Physical Exam Well nourished and well developed. General: Alert and oriented x3, cooperative and pleasant, no acute distress.  Musculoskeletal: Left hip exam: Range of motion assessment of the left hip reveals limitation with hip flexion internal rotation to 10 degrees with reproducible groin pain with pelvic tilting, external rotation close to 30 degrees No loss of strength Active hip flexion without external rotation contracture Neurovascular intact in the left lower extremity without lower extremity edema, erythema or calf tenderness     Vital signs in last 24 hours:    Labs:   Estimated body mass index is 32.11 kg/m as calculated from the following:   Height as of 06/05/24: 5' 4.5 (1.638 m).   Weight as of 06/05/24: 86.2 kg.   Imaging Review Plain radiographs demonstrate severe degenerative joint disease of the left hip(s). The bone quality appears to be adequate for age and reported activity level.      Assessment/Plan:  End stage arthritis, left hip(s)  The patient history, physical examination, clinical judgement of the provider and imaging studies are consistent with end stage degenerative joint disease of the left hip(s) and total hip arthroplasty is  deemed medically necessary. The treatment options including medical management, injection therapy, arthroscopy and arthroplasty were discussed at length. The risks and benefits of total hip arthroplasty were presented and reviewed. The risks due to aseptic loosening, infection, stiffness, dislocation/subluxation,  thromboembolic complications and other imponderables were discussed.  The patient acknowledged the explanation, agreed to proceed with the plan and consent was signed. Patient is being admitted for inpatient treatment for surgery, pain control, PT, OT, prophylactic antibiotics, VTE prophylaxis, progressive ambulation and ADL's and discharge planning.The patient is planning to be discharged home.    Patient's anticipated LOS is less than 2 midnights, meeting these requirements: - Younger than 28 - Lives within 1 hour of care - Has a competent adult at home to recover with post-op recover - NO history of  - Chronic pain requiring opiods  - Diabetes  - Coronary Artery Disease  - Heart failure  - Heart attack  - Stroke  - DVT/VTE  - Cardiac arrhythmia  - Respiratory Failure/COPD  - Renal failure  - Anemia  - Advanced Liver disease  Rosina Calin, PA-C Orthopedic Surgery [facility] [place] [phone]

## 2024-06-18 NOTE — Care Plan (Signed)
 Ortho Bundle Case Management Note  Patient Details  Name: Debra Woodward MRN: [id number] Date of Birth: 1951-04-16  LT THA on 06/18/24  DCP: Home with daughter DME: RW, ordered through [facility] PT: HEP                   DME Arranged:  Walker rolling DME Agency:  [facility]  HH Arranged:    HH Agency:     Additional Comments: Please contact me with any questions of if this plan should need to change.  Burnard Dross, Case Manager [facility] [phone]  Ext. [phone]   06/18/2024, 12:54 PM

## 2024-06-19 ENCOUNTER — Encounter (HOSPITAL_COMMUNITY): Payer: Self-pay | Admitting: Orthopedic Surgery

## 2024-06-19 DIAGNOSIS — Z79899 Other long term (current) drug therapy: Secondary | ICD-10-CM | POA: Diagnosis not present

## 2024-06-19 DIAGNOSIS — Z96651 Presence of right artificial knee joint: Secondary | ICD-10-CM | POA: Diagnosis not present

## 2024-06-19 DIAGNOSIS — I10 Essential (primary) hypertension: Secondary | ICD-10-CM | POA: Diagnosis not present

## 2024-06-19 DIAGNOSIS — Z7982 Long term (current) use of aspirin: Secondary | ICD-10-CM | POA: Diagnosis not present

## 2024-06-19 DIAGNOSIS — J45909 Unspecified asthma, uncomplicated: Secondary | ICD-10-CM | POA: Diagnosis not present

## 2024-06-19 DIAGNOSIS — M1612 Unilateral primary osteoarthritis, left hip: Secondary | ICD-10-CM | POA: Diagnosis not present

## 2024-06-19 DIAGNOSIS — F17210 Nicotine dependence, cigarettes, uncomplicated: Secondary | ICD-10-CM | POA: Diagnosis not present

## 2024-06-19 LAB — CBC
HCT: 26.2 % — ABNORMAL LOW (ref 36.0–46.0)
Hemoglobin: 8.5 g/dL — ABNORMAL LOW (ref 12.0–15.0)
MCH: 31.6 pg (ref 26.0–34.0)
MCHC: 32.4 g/dL (ref 30.0–36.0)
MCV: 97.4 fL (ref 80.0–100.0)
Platelets: 363 K/uL (ref 150–400)
RBC: 2.69 MIL/uL — ABNORMAL LOW (ref 3.87–5.11)
RDW: 14.5 % (ref 11.5–15.5)
WBC: 18.5 K/uL — ABNORMAL HIGH (ref 4.0–10.5)
nRBC: 0 % (ref 0.0–0.2)

## 2024-06-19 LAB — BASIC METABOLIC PANEL WITH GFR
Anion gap: 11 (ref 5–15)
BUN: 17 mg/dL (ref 8–23)
CO2: 22 mmol/L (ref 22–32)
Calcium: 8.9 mg/dL (ref 8.9–10.3)
Chloride: 102 mmol/L (ref 98–111)
Creatinine, Ser: 0.93 mg/dL (ref 0.44–1.00)
GFR, Estimated: >60 mL/min (ref >60)
Glucose, Bld: 186 mg/dL — ABNORMAL HIGH (ref 70–99)
Potassium: 4.1 mmol/L (ref 3.5–5.1)
Sodium: 135 mmol/L (ref 135–145)

## 2024-06-19 MED ORDER — ASPIRIN 81 MG PO CHEW: 81.0000 mg | CHEWABLE_TABLET | Freq: Two times a day (BID) | ORAL | Status: AC

## 2024-06-19 NOTE — Plan of Care (Signed)

## 2024-06-19 NOTE — Care Management Obs Status (Signed)
 MEDICARE OBSERVATION STATUS NOTIFICATION   Patient Details  Name: Debra Woodward MRN: [id number] Date of Birth: 1951/03/26   Medicare Observation Status Notification Given:  Yes    Alfonse JONELLE Rex, RN 06/19/2024, 9:44 AM

## 2024-06-19 NOTE — TOC Transition Note (Signed)
 Transition of Care [facility]) - Discharge Note   Patient Details  Name: Debra Woodward MRN: [id number] Date of Birth: 31-Dec-1950  Transition of [facility] CM/SW Contact:  Alfonse JONELLE Rex, RN Phone Number: 06/19/2024, 10:36 AM   Clinical Narrative:   Met with patient at bedside to review dc therapy and home equipment needs, pt confirmed HEP, RW delivered to bedside by [facility]. MOON completed. No TOC needs.     Final next level of care: Home/Self Care Barriers to Discharge: No Barriers Identified   Patient Goals and CMS Choice Patient states their goals for this hospitalization and ongoing recovery are:: return home          Discharge Placement                       Discharge Plan and Services Additional resources added to the After Visit Summary for                  DME Arranged: Walker rolling DME Agency: [facility]                  Social Drivers of Health (SDOH) Interventions SDOH Screenings   Food Insecurity: Unknown (06/18/2024)  Housing: Low Risk  (06/18/2024)  Transportation Needs: No Transportation Needs (06/18/2024)  Utilities: Not At Risk (06/18/2024)  Alcohol Screen: Low Risk  (05/18/2024)  Depression (PHQ2-9): Medium Risk (10/18/2023)  Financial Resource Strain: Low Risk  (05/18/2024)  Physical Activity: Sufficiently Active (05/18/2024)  Social Connections: Moderately Integrated (06/18/2024)  Stress: No Stress Concern Present (05/18/2024)  Tobacco Use: High Risk (06/18/2024)  Health Literacy: Adequate Health Literacy (07/06/2023)     Readmission Risk Interventions     No data to display

## 2024-06-19 NOTE — Progress Notes (Signed)
   Subjective: 1 Day Post-Op Procedure(s) (LRB): ARTHROPLASTY, HIP, TOTAL, ANTERIOR APPROACH (Left) Patient reports pain as mild.   Patient seen in rounds with Dr. Ernie. Patient is resting in bed on exam this morning. No acute events overnight. Foley catheter removed. Patient  has not been up with PT yet.  We will start therapy today.   Objective: Vital signs in last 24 hours: Temp:  [97.5 F (36.4 C)-98.9 F (37.2 C)] 98.9 F (37.2 C) (08/06 0539) Pulse Rate:  [53-83] 83 (08/06 0539) Resp:  [12-20] 18 (08/06 0539) BP: (89-164)/(60-89) 125/73 (08/06 0539) SpO2:  [96 %-100 %] 97 % (08/06 0539) Weight:  [86.2 kg] 86.2 kg (08/05 1122)  Intake/Output from previous day:  Intake/Output Summary (Last 24 hours) at 06/19/2024 0736 Last data filed at 06/19/2024 0600 Gross per 24 hour  Intake 1880 ml  Output 2800 ml  Net -920 ml     Intake/Output this shift: No intake/output data recorded.  Labs: Recent Labs    06/19/24 0356  HGB 8.5*   Recent Labs    06/19/24 0356  WBC 18.5*  RBC 2.69*  HCT 26.2*  PLT 363   Recent Labs    06/19/24 0356  NA 135  K 4.1  CL 102  CO2 22  BUN 17  CREATININE 0.93  GLUCOSE 186*  CALCIUM 8.9   No results for input(s): LABPT, INR in the last 72 hours.  Exam: General - Patient is Alert and Oriented Extremity - Neurologically intact Sensation intact distally Intact pulses distally Dorsiflexion/Plantar flexion intact Dressing - dressing C/D/I Motor Function - intact, moving foot and toes well on exam.   Past Medical History:  Diagnosis Date   ALLERGIC RHINITIS 09/14/2006   Allergy    Anxiety    Arthralgia of left ankle 04/20/2021   Arthritis    Asthma    Fibroid    Hx of adenomatous colonic polyps 07/28/2015   Hypertension    Impacted cerumen of left ear 04/14/2022   PONV (postoperative nausea and vomiting)    Pre-diabetes    Seasonal allergies     Assessment/Plan: 1 Day Post-Op Procedure(s) (LRB): ARTHROPLASTY,  HIP, TOTAL, ANTERIOR APPROACH (Left) Principal Problem:   S/P total left hip arthroplasty  Estimated body mass index is 31.14 kg/m as calculated from the following:   Height as of this encounter: 5' 5.5 (1.664 m).   Weight as of this encounter: 86.2 kg. Advance diet Up with therapy D/C IV fluids  DVT Prophylaxis - Aspirin  Weight bearing as tolerated.  Hgb stable at 8.5 this AM  She had all post op meds delivered via [facility] prior to surgery and has at home   Plan is to go Home after hospital stay. Plan for discharge today after meeting goals with therapy. Follow up in the office in 2 weeks.   Rosina Calin, PA-C Orthopedic Surgery [phone] 06/19/2024, 7:36 AM

## 2024-06-19 NOTE — Evaluation (Signed)
 Physical Therapy Evaluation Patient Details Name: Rosio Woodward MRN: [id number] DOB: 1950-12-23 Today's Date: 06/19/2024  History of Present Illness  73 yo female presents to therapy s/p L THA, anterior approach on 06/18/2024 due to failure of conservative measures. Pt PMH includes but is not limited to: HTN, B HOH, DDD lumbar and sacral regions, anxiety, asthma, acute recurrent sialoadenitis,  and R TKA.  Clinical Impression  Pt is s/p THA resulting in the deficits listed below (see PT Problem List). At baseline, pt independent and lives alone. Her daughter will be staying with her at d/c and a walker has been delivered to her room.  Today, pt very motivated and eager to return home.  She rated pain at 0/10.  Pt ambulated 78' with RW and performed stairs similar to home set-up. Pt demonstrates safe gait & transfers in order to return home from PT perspective once discharged by MD.  While in hospital, will continue to benefit from PT for skilled therapy to advance mobility and exercises.         If plan is discharge home, recommend the following: A little help with walking and/or transfers;A little help with bathing/dressing/bathroom;Assistance with cooking/housework;Help with stairs or ramp for entrance   Can travel by private vehicle        Equipment Recommendations Rolling walker (2 wheels)  Recommendations for Other Services       Functional Status Assessment Patient has had a recent decline in their functional status and demonstrates the ability to make significant improvements in function in a reasonable and predictable amount of time.     Precautions / Restrictions Precautions Precautions: Fall Restrictions Weight Bearing Restrictions Per Provider Order: Yes LLE Weight Bearing Per Provider Order: Weight bearing as tolerated      Mobility  Bed Mobility Overal bed mobility: Needs Assistance Bed Mobility: Supine to Sit     Supine to sit: HOB elevated, Supervision      General bed mobility comments: self assist L LE with bil UE    Transfers Overall transfer level: Needs assistance Equipment used: Rolling walker (2 wheels) Transfers: Sit to/from Stand Sit to Stand: Supervision, Contact guard assist           General transfer comment: CGA progressed to supervision; STS x 3 during session (bed, chair, toielt)    Ambulation/Gait Ambulation/Gait assistance: Supervision, Contact guard assist Gait Distance (Feet): 80 Feet Assistive device: Rolling walker (2 wheels) Gait Pattern/deviations: Step-to pattern, Decreased stride length, Decreased weight shift to left Gait velocity: decreased     General Gait Details: CGA progressed to supervision; initial cues for RW proximity and sequence with good carryover  Stairs Stairs: Yes Stairs assistance: Contact guard assist Stair Management: Forwards, Step to pattern Number of Stairs: 4 General stair comments: Started with bil rails and progressed to cane and HHA; tolerated well  Wheelchair Mobility     Tilt Bed    Modified Rankin (Stroke Patients Only)       Balance Overall balance assessment: Needs assistance Sitting-balance support: No upper extremity supported Sitting balance-Leahy Scale: Good     Standing balance support: Bilateral upper extremity supported, No upper extremity supported Standing balance-Leahy Scale: Fair Standing balance comment: Steady wtih RW to ambulate; could stand wtihout UE support                             Pertinent Vitals/Pain Pain Assessment Pain Assessment: No/denies pain    Home Living Family/patient expects to  be discharged to:: Private residence Living Arrangements: Alone Available Help at Discharge: Family;Available 24 hours/day (dtr staying several days) Type of Home: House Home Access: Stairs to enter Entrance Stairs-Rails: None Entrance Stairs-Number of Steps: 2   Home Layout: One level Home Equipment: Tub bench;Cane - single  point;Toilet riser      Prior Function Prior Level of Function : Independent/Modified Independent;Driving             Mobility Comments: Could ambulate in community without AD       Extremity/Trunk Assessment        Lower Extremity Assessment Lower Extremity Assessment: LLE deficits/detail;RLE deficits/detail RLE Deficits / Details: ROM WFL; MMT 5/5 LLE Deficits / Details: Expected post op changes; ROM WFL ; MMT ankle 5/5, knee 3/5, hip 2/5    Cervical / Trunk Assessment Cervical / Trunk Assessment: Normal  Communication        Cognition Arousal: Alert Behavior During Therapy: WFL for tasks assessed/performed   PT - Cognitive impairments: No apparent impairments                       PT - Cognition Comments: pleasant and motivated         Cueing       General Comments General comments (skin integrity, edema, etc.): VSS  Educated on safe ice use, no pivots, car transfers, and TED hose during day. Also, encouraged walking every 1-2 hours during day for 5-10 mins. Educated on HEP with focus on mobility the first week. Discussed doing exercises within pain control and if pain increasing could decreased ROM, reps, and stop exercises as needed. Encouraged to perform ankle pumps frequently for blood flow.     Exercises Total Joint Exercises Ankle Circles/Pumps: AROM, Both, 10 reps, Supine Quad Sets: AROM, Both, 10 reps, Supine Heel Slides: AAROM, Left, 5 reps, Supine Hip ABduction/ADduction: AAROM, Left, 5 reps, Supine Long Arc Quad: AROM, Left, 10 reps, Seated Other Exercises Other Exercises: Standing AROM with RW and CGA L LE x 10: marching, hip ext, hip abd, h-s curl   Assessment/Plan    PT Assessment Patient needs continued PT services  PT Problem List Decreased strength;Decreased mobility;Decreased range of motion;Decreased activity tolerance;Decreased balance;Decreased knowledge of use of DME       PT Treatment Interventions DME  instruction;Therapeutic exercise;Gait training;Stair training;Functional mobility training;Therapeutic activities;Patient/family education;Modalities;Balance training    PT Goals (Current goals can be found in the Care Plan section)  Acute Rehab PT Goals Patient Stated Goal: return home PT Goal Formulation: With patient Time For Goal Achievement: 07/03/24 Potential to Achieve Goals: Good    Frequency 7X/week     Co-evaluation               AM-PAC PT 6 Clicks Mobility  Outcome Measure Help needed turning from your back to your side while in a flat bed without using bedrails?: A Little Help needed moving from lying on your back to sitting on the side of a flat bed without using bedrails?: A Little Help needed moving to and from a bed to a chair (including a wheelchair)?: A Little Help needed standing up from a chair using your arms (e.g., wheelchair or bedside chair)?: A Little Help needed to walk in hospital room?: A Little Help needed climbing 3-5 steps with a railing? : A Little 6 Click Score: 18    End of Session Equipment Utilized During Treatment: Gait belt Activity Tolerance: Patient tolerated treatment well Patient left: with call  bell/phone within reach;in chair Nurse Communication: Mobility status PT Visit Diagnosis: Other abnormalities of gait and mobility (R26.89);Muscle weakness (generalized) (M62.81)    Time: 8985-8951 PT Time Calculation (min) (ACUTE ONLY): 34 min   Charges:   PT Evaluation $PT Eval Low Complexity: 1 Low PT Treatments $Gait Training: 8-22 mins PT General Charges $$ ACUTE PT VISIT: 1 Visit         Debra, PT Acute Rehab [facility] [phone]   Debra Woodward 06/19/2024, 11:49 AM

## 2024-06-19 NOTE — Progress Notes (Signed)
 Discharge instructions given to patient and all questions were answered.

## 2024-06-21 NOTE — Discharge Summary (Signed)
 Patient ID: Debra Woodward MRN: [id number] DOB/AGE: [birth year] 73 y.o.  Admit date: 06/18/2024 Discharge date: 06/19/2024  Admission Diagnoses:  Left hip osteoarthritis  Discharge Diagnoses:  Principal Problem:   S/P total left hip arthroplasty   Past Medical History:  Diagnosis Date   ALLERGIC RHINITIS 09/14/2006   Allergy    Anxiety    Arthralgia of left ankle 04/20/2021   Arthritis    Asthma    Fibroid    Hx of adenomatous colonic polyps 07/28/2015   Hypertension    Impacted cerumen of left ear 04/14/2022   PONV (postoperative nausea and vomiting)    Pre-diabetes    Seasonal allergies     Surgeries: Procedure(s): ARTHROPLASTY, HIP, TOTAL, ANTERIOR APPROACH on 06/18/2024   Consultants:   Discharged Condition: Improved  Hospital Course: Kylani Wires is an 73 y.o. female who was admitted 06/18/2024 for operative treatment ofS/P total left hip arthroplasty. Patient has severe unremitting pain that affects sleep, daily activities, and work/hobbies. After pre-op clearance the patient was taken to the operating room on 06/18/2024 and underwent  Procedure(s): ARTHROPLASTY, HIP, TOTAL, ANTERIOR APPROACH.    Patient was given perioperative antibiotics:  Anti-infectives (From admission, onward)    Start     Dose/Rate Route Frequency Ordered Stop   06/18/24 1930  ceFAZolin  (ANCEF ) IVPB 2g/100 mL premix        2 g 200 mL/hr over 30 Minutes Intravenous Every 6 hours 06/18/24 1723 06/19/24 0112   06/18/24 1115  ceFAZolin  (ANCEF ) IVPB 2g/100 mL premix        2 g 200 mL/hr over 30 Minutes Intravenous On call to O.R. 06/18/24 1102 06/18/24 1324        Patient was given sequential compression devices, early ambulation, and chemoprophylaxis to prevent DVT. Patient worked with PT and was meeting their goals regarding safe ambulation and transfers.  Patient benefited maximally from hospital stay and there were no complications.    Recent vital signs: No data found.   Recent  laboratory studies:  Recent Labs    06/19/24 0356  WBC 18.5*  HGB 8.5*  HCT 26.2*  PLT 363  NA 135  K 4.1  CL 102  CO2 22  BUN 17  CREATININE 0.93  GLUCOSE 186*  CALCIUM 8.9     Discharge Medications:   Allergies as of 06/19/2024       Reactions   Bee Venom Hives   Prednisone  Other (See Comments)   hallucinations   Beef-derived Drug Products    Doesn't eat any type of red meat        Medication List     STOP taking these medications    aspirin  EC 81 MG tablet Replaced by: aspirin  81 MG chewable tablet   cyclobenzaprine  10 MG tablet Commonly known as: FLEXERIL        TAKE these medications    albuterol  108 (90 Base) MCG/ACT inhaler Commonly known as: VENTOLIN  HFA Inhale 1-2 puffs into the lungs every 6 (six) hours as needed for wheezing or shortness of breath.   amLODipine  5 MG tablet Commonly known as: NORVASC  TAKE 1 TABLET EVERY DAY   aspirin  81 MG chewable tablet Chew 1 tablet (81 mg total) by mouth 2 (two) times daily for 28 days. Replaces: aspirin  EC 81 MG tablet   cetirizine  10 MG tablet Commonly known as: ZYRTEC  Take 10 mg by mouth daily as needed for allergies.   CINNAMON PO Take 1 capsule by mouth daily.   desonide  0.05 % cream Commonly known  as: DESOWEN  Apply 1 Application topically daily as needed (irritation).   diclofenac Sodium 1 % Gel Commonly known as: VOLTAREN Apply 2 g topically daily as needed (pain).   docusate sodium  100 MG capsule Commonly known as: COLACE Take 1 capsule (100 mg total) by mouth every 12 (twelve) hours.   EPINEPHrine  0.3 mg/0.3 mL Soaj injection Commonly known as: EpiPen  2-Pak Inject 0.3 mg into the muscle as needed for anaphylaxis.   fexofenadine 180 MG tablet Commonly known as: ALLEGRA Take 180 mg by mouth daily as needed for allergies.   fluticasone  50 MCG/ACT nasal spray Commonly known as: FLONASE  USE 2 SPRAY(S) IN EACH NOSTRIL ONCE DAILY AT  NIGHT What changed:  how much to take how to  take this when to take this reasons to take this additional instructions   ipratropium 0.06 % nasal spray Commonly known as: ATROVENT Place 2 sprays into both nostrils in the morning and at bedtime.   lidocaine  5 % Commonly known as: Lidoderm  Place 1 patch onto the skin daily. Remove & Discard patch within 12 hours or as directed by MD What changed:  when to take this reasons to take this   multivitamin tablet Take 1 tablet by mouth daily.   ondansetron  4 MG tablet Commonly known as: ZOFRAN  Take 4 mg by mouth every 8 (eight) hours as needed for vomiting or nausea.   triamcinolone  ointment 0.1 % Commonly known as: KENALOG  Apply small amt twice per day x 14 days then twice weekly   Vitamin C 500 MG Caps Take 500 mg by mouth daily.   Vitamin D -3 125 MCG (5000 UT) Tabs Take 5,000 Units by mouth every other day.               Discharge Care Instructions  (From admission, onward)           Start     Ordered   06/19/24 0000  Change dressing       Comments: Maintain surgical dressing until follow up in the clinic. If the edges start to pull up, may reinforce with tape. If the dressing is no longer working, may remove and cover with gauze and tape, but must keep the area dry and clean.  Call with any questions or concerns.   06/19/24 0738            Diagnostic Studies: DG Pelvis Portable Result Date: 06/18/2024 CLINICAL DATA:  Status post hip arthroplasty. EXAM: PORTABLE PELVIS 1-2 VIEWS COMPARISON:  None Available. FINDINGS: Left hip arthroplasty in expected alignment. No periprosthetic lucency or fracture. Recent postsurgical change includes air and edema in the soft tissues. IMPRESSION: Left hip arthroplasty without immediate postoperative complication. Electronically Signed   By: Andrea Gasman M.D.   On: 06/18/2024 17:48   DG HIP UNILAT WITH PELVIS 1V RIGHT Result Date: 06/18/2024 CLINICAL DATA:  Elective surgery EXAM: DG HIP (WITH OR WITHOUT PELVIS) 1V  RIGHT COMPARISON:  None Available. FINDINGS: Two fluoroscopic spot views of the pelvis and left hip obtained in the operating room. Images during hip arthroplasty. Fluoroscopy time 12 seconds. Dose 1.8 mGy. IMPRESSION: Intraoperative fluoroscopy during hip arthroplasty. Electronically Signed   By: Andrea Gasman M.D.   On: 06/18/2024 15:01   DG C-Arm 1-60 Min-No Report Result Date: 06/18/2024 Fluoroscopy was utilized by the requesting physician.  No radiographic interpretation.   DG C-Arm 1-60 Min-No Report Result Date: 06/18/2024 Fluoroscopy was utilized by the requesting physician.  No radiographic interpretation.    Disposition: Discharge disposition: 01-Home  or Self Care       Discharge Instructions     Call MD / Call 911   Complete by: As directed    If you experience chest pain or shortness of breath, CALL 911 and be transported to the hospital emergency room.  If you develope a fever above 101 F, pus (white drainage) or increased drainage or redness at the wound, or calf pain, call your surgeon's office.   Change dressing   Complete by: As directed    Maintain surgical dressing until follow up in the clinic. If the edges start to pull up, may reinforce with tape. If the dressing is no longer working, may remove and cover with gauze and tape, but must keep the area dry and clean.  Call with any questions or concerns.   Constipation Prevention   Complete by: As directed    Drink plenty of fluids.  Prune juice may be helpful.  You may use a stool softener, such as Colace (over the counter) 100 mg twice a day.  Use MiraLax  (over the counter) for constipation as needed.   Diet - low sodium heart healthy   Complete by: As directed    Increase activity slowly as tolerated   Complete by: As directed    Weight bearing as tolerated with assist device (walker, cane, etc) as directed, use it as long as suggested by your surgeon or therapist, typically at least 4-6 weeks.   Post-operative  opioid taper instructions:   Complete by: As directed    POST-OPERATIVE OPIOID TAPER INSTRUCTIONS: It is important to wean off of your opioid medication as soon as possible. If you do not need pain medication after your surgery it is ok to stop day one. Opioids include: Codeine, Hydrocodone (Norco, Vicodin), Oxycodone (Percocet, oxycontin ) and hydromorphone  amongst others.  Long term and even short term use of opiods can cause: Increased pain response Dependence Constipation Depression Respiratory depression And more.  Withdrawal symptoms can include Flu like symptoms Nausea, vomiting And more Techniques to manage these symptoms Hydrate well Eat regular healthy meals Stay active Use relaxation techniques(deep breathing, meditating, yoga) Do Not substitute Alcohol to help with tapering If you have been on opioids for less than two weeks and do not have pain than it is ok to stop all together.  Plan to wean off of opioids This plan should start within one week post op of your joint replacement. Maintain the same interval or time between taking each dose and first decrease the dose.  Cut the total daily intake of opioids by one tablet each day Next start to increase the time between doses. The last dose that should be eliminated is the evening dose.      TED hose   Complete by: As directed    Use stockings (TED hose) for 2 weeks on both leg(s).  You may remove them at night for sleeping.        Follow-up Information     Ernie Cough, MD. Go on 07/03/2024.   Specialty: Orthopedic Surgery Why: You are scheduled for a post op appointment on Wednesday 07/03/24 at 8:00am Contact information: [address] [place] [address] [place] [address] [phone]                  Signed: Rosina JONELLE Calin 06/21/2024, 7:20 AM

## 2024-07-02 ENCOUNTER — Encounter: Payer: Self-pay | Admitting: Podiatry

## 2024-07-02 ENCOUNTER — Ambulatory Visit: Admitting: Podiatry

## 2024-07-02 DIAGNOSIS — M79675 Pain in left toe(s): Secondary | ICD-10-CM

## 2024-07-02 DIAGNOSIS — M79674 Pain in right toe(s): Secondary | ICD-10-CM

## 2024-07-02 DIAGNOSIS — B351 Tinea unguium: Secondary | ICD-10-CM | POA: Diagnosis not present

## 2024-07-07 NOTE — Progress Notes (Signed)
  Subjective:  Patient ID: Debra Woodward, female    DOB: 08-14-1951,  MRN: [id number]  Debra Woodward presents to clinic today for painful thick toenails that are difficult to trim. Pain interferes with ambulation. Aggravating factors include wearing enclosed shoe gear. Pain is relieved with periodic professional debridement.  Chief Complaint  Patient presents with   RFC    Rm15 Routine foot care/ Dr. Crawford last visit May 21 2024   New problem(s): None.   PCP is Panosh, Apolinar POUR, MD.  Allergies  Allergen Reactions   Bee Venom Hives   Prednisone  Other (See Comments)    hallucinations   Beef-Derived Drug Products     Doesn't eat any type of red meat    Review of Systems: Negative except as noted in the HPI.  Objective: No changes noted in today's physical examination. There were no vitals filed for this visit. Debra Woodward is a pleasant 73 y.o. female in NAD. AAO x 3.  Vascular Examination: Capillary refill time immediate b/l. Palpable pedal pulses. Pedal hair present b/l. Pedal edema absent. No pain with calf compression b/l. Skin temperature gradient WNL b/l. No cyanosis or clubbing b/l. No ischemia or gangrene noted b/l.   Neurological Examination: Sensation grossly intact b/l with 10 gram monofilament.   Dermatological Examination: Pedal skin with normal turgor, texture and tone b/l.  No open wounds. No interdigital macerations.   Toenails 1-5 b/l thick, discolored, elongated with subungual debris and pain on dorsal palpation.   No hyperkeratotic nor porokeratotic lesions.  Musculoskeletal Examination: Muscle strength 5/5 to all lower extremity muscle groups bilaterally. No pain, crepitus or joint limitation noted with ROM bilateral LE. No gross bony deformities bilaterally.  Radiographs: None  Last A1c:      Latest Ref Rng & Units 05/21/2024    9:04 AM 01/18/2024    9:18 AM 10/18/2023   10:22 AM  Hemoglobin A1C  Hemoglobin-A1c 4.0 - 5.6 % 6.3  6.4  6.7     Assessment/Plan: 1. Pain due to onychomycosis of toenails of both feet    Consent given for treatment. Patient examined. All patient's and/or POA's questions/concerns addressed on today's visit. Mycotic toenails 1-5 debrided in length and girth without incident. Continue soft, supportive shoe gear daily. Report any pedal injuries to medical professional. Call office if there are any quesitons/concerns. Return in about 3 months (around 10/02/2024).  Debra Woodward, DPM      [facility] LOCATION: [address] [place] [address]                   Office [phone]   [facility] LOCATION: [address] [place], [place] [address] Office [phone]

## 2024-07-23 DIAGNOSIS — Z974 Presence of external hearing-aid: Secondary | ICD-10-CM | POA: Diagnosis not present

## 2024-07-23 DIAGNOSIS — H6123 Impacted cerumen, bilateral: Secondary | ICD-10-CM | POA: Diagnosis not present

## 2024-08-05 DIAGNOSIS — Z471 Aftercare following joint replacement surgery: Secondary | ICD-10-CM | POA: Diagnosis not present

## 2024-08-05 DIAGNOSIS — Z96642 Presence of left artificial hip joint: Secondary | ICD-10-CM | POA: Diagnosis not present

## 2024-08-06 ENCOUNTER — Ambulatory Visit (INDEPENDENT_AMBULATORY_CARE_PROVIDER_SITE_OTHER): Admitting: Family Medicine

## 2024-08-06 DIAGNOSIS — Z0000 Encounter for general adult medical examination without abnormal findings: Secondary | ICD-10-CM

## 2024-08-06 NOTE — Patient Instructions (Signed)
 I really enjoyed getting to talk with you today! I am available on Tuesdays and Thursdays for virtual visits if you have any questions or concerns, or if I can be of any further assistance.   CHECKLIST FROM ANNUAL WELLNESS VISIT:  -Follow up (please call to schedule if not scheduled after visit):   -yearly for annual wellness visit with primary care office  Here is a list of your preventive care/health maintenance measures and the plan for each if any are due:  PLAN For any measures below that may be due:    1. Can get vaccines at the pharmacy. Please let us  know when you do so that we can update your record.  Health Maintenance  Topic Date Due   Influenza Vaccine  06/14/2024   COVID-19 Vaccine (8 - Pfizer risk 2024-25 season) 09/16/2024   Mammogram  03/07/2025   Medicare Annual Wellness (AWV)  08/06/2025   DTaP/Tdap/Td (3 - Td or Tdap) 01/26/2028   Colonoscopy  11/20/2028   Pneumococcal Vaccine: 50+ Years  Completed   DEXA SCAN  Completed   Hepatitis C Screening  Completed   Zoster Vaccines- Shingrix  Completed   HPV VACCINES  Aged Out   Meningococcal B Vaccine  Aged Out   Hepatitis B Vaccines 19-59 Average Risk  Discontinued    -See a dentist at least yearly  -Get your eyes checked and then per your eye specialist's recommendations  -Other issues addressed today:   1. Please quit smoking. Let us  know if you need any help.   -I have included below further information regarding a healthy whole foods based diet, physical activity guidelines for adults, stress management and opportunities for social connections. I hope you find this information useful.   -----------------------------------------------------------------------------------------------------------------------------------------------------------------------------------------------------------------------------------------------------------    NUTRITION: -eat real food: lots of colorful vegetables (half the  plate) and fruits -5-7 servings of vegetables and fruits per day (fresh or steamed is best), exp. 2 servings of vegetables with lunch and dinner and 2 servings of fruit per day. Berries and greens such as kale and collards are great choices.  -consume on a regular basis:  fresh fruits, fresh veggies, fish, nuts, seeds, healthy oils (such as olive oil, avocado oil), whole grains (make sure for bread/pasta/crackers/etc., that the first ingredient on label contains the word whole), legumes. -can eat small amounts of dairy and lean meat (no larger than the palm of your hand), but avoid processed meats such as ham, bacon, lunch meat, etc. -drink water  -try to avoid fast food and pre-packaged foods, processed meat, ultra processed foods/beverages (donuts, candy, etc.) -most experts advise limiting sodium to < 2300mg  per day, should limit further is any chronic conditions such as high blood pressure, heart disease, diabetes, etc. The American Heart Association advised that < 1500mg  is is ideal -try to avoid foods/beverages that contain any ingredients with names you do not recognize  -try to avoid foods/beverages  with added sugar or sweeteners/sweets  -try to avoid sweet drinks (including diet drinks): soda, juice, Gatorade, sweet tea, power drinks, diet drinks -try to avoid white rice, white bread, pasta (unless whole grain)  EXERCISE GUIDELINES FOR ADULTS: -if you wish to increase your physical activity, do so gradually and with the approval of your doctor -STOP and seek medical care immediately if you have any chest pain, chest discomfort or trouble breathing when starting or increasing exercise  -move and stretch your body, legs, feet and arms when sitting for long periods -Physical activity guidelines for optimal health in  adults: -get at least 150 minutes per week of moderate exercise (can talk, but not sing); this is about 20-30 minutes of sustained activity 5-7 days per week or two 10-15 minute  episodes of sustained activity 5-7 days per week -do some muscle building/resistance training/strength training at least 2 days per week  -balance exercises 3+ days per week:   Stand somewhere where you have something sturdy to hold onto if you lose balance    1) lift up on toes, then back down, start with 5x per day and work up to 20x   2) stand and lift one leg straight out to the side so that foot is a few inches of the floor, start with 5x each side and work up to 20x each side   3) stand on one foot, start with 5 seconds each side and work up to 20 seconds on each side  If you need ideas or help with getting more active:  -Silver sneakers https://tools.silversneakers.com  -Walk with a Doc: http://www.duncan-williams.com/  -try to include resistance (weight lifting/strength building) and balance exercises twice per week: or the following link for ideas: http://castillo-powell.com/  BuyDucts.dk  STRESS MANAGEMENT: -can try meditating, or just sitting quietly with deep breathing while intentionally relaxing all parts of your body for 5 minutes daily -if you need further help with stress, anxiety or depression please follow up with your primary doctor or contact the wonderful folks at [facility]: [phone]  SOCIAL CONNECTIONS: -options in [place] if you wish to engage in more social and exercise related activities:  -Silver sneakers https://tools.silversneakers.com  -Walk with a Doc: http://www.duncan-williams.com/  -Check out the [facility] Active Adults 50+ section on the [place] [employer] (hiking clubs, book clubs, cards and games, chess, exercise classes, aquatic classes and much more) - see the website for details: https://www.[place].gov/departments/parks-recreation/active-adults50  -YouTube has lots of exercise videos for different ages and abilities as  well  -[facility] (a variety of indoor and outdoor inperson activities for adults). [phone]. [address]  -Virtual Online Classes (a variety of topics): see seniorplanet.org or call [phone]  -consider volunteering at a school, hospice center, church, senior center or elsewhere

## 2024-08-06 NOTE — Progress Notes (Signed)
 PATIENT CHECK-IN and HEALTH RISK ASSESSMENT QUESTIONNAIRE:  -completed by phone/video for upcoming Medicare Preventive Visit  Pre-Visit Check-in: 1)Vitals (height, wt, BP, etc) - record in vitals section for visit on day of visit Request home vitals (wt, BP, etc.) and enter into vitals, THEN update Vital Signs SmartPhrase below at the top of the HPI. See below.  2)Review and Update Medications, Allergies PMH, Surgeries, Social history in Epic 3)Hospitalizations in the last year with date/reason? n  4)Review and Update Care Team (patient's specialists) in Epic 5) Complete PHQ9 in Epic  6) Complete Fall Screening in Epic 7)Review all Health Maintenance Due and order if not done.  Medicare Wellness Patient Questionnaire:  Answer theses question about your habits: How often do you have a drink containing alcohol? 1-2 times per month How many drinks containing alcohol do you have on a typical day when you are drinking? 1 drink How often do you have six or more drinks on one occasion?na Have you ever smoked?y Quit date if applicable? Na - current smoker  How many packs a day do/did you smoke? Down to about 2-3 a day Do you use smokeless tobacco?n Do you use an illicit drugs?n On average, how many days per week do you engage in moderate to strenuous exercise (like a brisk walk)?several; days a week On average, how many minutes do you engage in exercise at this level?120 - usually walking and leg exercises Typical diet: protein, veggies, some crackers - tries to avoid bread though, avoiding sweets Social connections: good, more than 3-4 times per week interacts with friends/family/etc Sleep: sporadic and not great sometimes currently after stopping pain medications for pain - now improving since stopped pain meds a few weeks ago  Answer theses question about your everyday activities: Can you perform most household chores?y Are you deaf or have significant trouble hearing?n Do you feel that  you have a problem with memory?n Do you feel safe at home?y Last dentist visit?regular dental visit 8. Do you have any difficulty performing your everyday activities?n Are you having any difficulty walking, taking medications on your own, and or difficulty managing daily home needs?n Do you have difficulty walking or climbing stairs?n Do you have difficulty dressing or bathing?n Do you have difficulty doing errands alone such as visiting a doctor's office or shopping?n Do you currently have any difficulty preparing food and eating?n Do you currently have any difficulty using the toilet?n Do you have any difficulty managing your finances?n Do you have any difficulties with housekeeping of managing your housekeeping?n   Do you have Advanced Directives in place (Living Will, Healthcare Power or Attorney)? y   Last eye Exam and location?[facility], goes yearly - went in July   Do you currently use prescribed or non-prescribed narcotic or opioid pain medications?n  Do you have a history or close family history of breast, ovarian, tubal or peritoneal cancer or a family member with BRCA (breast cancer susceptibility 1 and 2) gene mutations? See PMH/FH      ----------------------------------------------------------------------------------------------------------------------------------------------------------------------------------------------------------------------  Because this visit was a virtual/telehealth visit, some criteria may be missing or patient reported. Any vitals not documented were not able to be obtained and vitals that have been documented are patient reported.    MEDICARE ANNUAL PREVENTIVE VISIT WITH PROVIDER: (Welcome to Medicare, initial annual wellness or annual wellness exam)  Virtual Visit via Video Note  I connected with Eddie Sniff on 08/06/24 by a video enabled telemedicine application and verified that I am speaking with the  correct person using two  identifiers.  Location patient: home Location provider:work or home office Persons participating in the virtual visit: patient, provider  Concerns and/or follow up today: had hip replacement in August and is doing well, reports is recovering well. Back to exercising and everything.    See HM section in Epic for other details of completed HM.    ROS: negative for report of fevers, unintentional weight loss, vision changes, vision loss, hearing loss or change, chest pain, sob, hemoptysis, melena, hematochezia, hematuria, falls, bleeding or bruising, thoughts of suicide or self harm, memory loss  Patient-completed extensive health risk assessment - reviewed and discussed with the patient: See Health Risk Assessment completed with patient prior to the visit either above or in recent phone note. This was reviewed in detailed with the patient today and appropriate recommendations, orders and referrals were placed as needed per Summary below and patient instructions.   Review of Medical History: -PMH, PSH, Family History and current specialty and care providers reviewed and updated and listed below   Patient Care Team: Panosh, Apolinar POUR, MD as PCP - General (Internal Medicine) Neysa Inocente PARAS, NP (Inactive) as Nurse Practitioner (Obstetrics and Gynecology) Liane Sharyne MATSU, [facility] (Inactive) as Pharmacist (Pharmacist)   Past Medical History:  Diagnosis Date   ALLERGIC RHINITIS 09/14/2006   Allergy    Anxiety    Arthralgia of left ankle 04/20/2021   Arthritis    Asthma    Fibroid    Hx of adenomatous colonic polyps 07/28/2015   Hypertension    Impacted cerumen of left ear 04/14/2022   PONV (postoperative nausea and vomiting)    Pre-diabetes    Seasonal allergies     Past Surgical History:  Procedure Laterality Date   ABDOMINAL HYSTERECTOMY  1996   BREAST EXCISIONAL BIOPSY Left 1983   No scar seen   BREAST EXCISIONAL BIOPSY Right 1987   NO scar seen   BREAST SURGERY Bilateral 1983,  1996   benign   CARPAL TUNNEL RELEASE Right 2008   COLONOSCOPY  07/23/2015   colonoscopy  11/21/2023   q25yrs   CYSTECTOMY Bilateral 1983, 1988   breast; benign   KNEE ARTHROSCOPY Right 2010   TOE SURGERY Bilateral    TOTAL HIP ARTHROPLASTY Left 06/18/2024   Procedure: ARTHROPLASTY, HIP, TOTAL, ANTERIOR APPROACH;  Surgeon: Ernie Cough, MD;  Location: [facility];  Service: Orthopedics;  Laterality: Left;   TOTAL KNEE ARTHROPLASTY Right 2011   TUBAL LIGATION  1996    Social History   Socioeconomic History   Marital status: Single    Spouse name: Not on file   Number of children: Not on file   Years of education: Not on file   Highest education level: Bachelor's degree (e.g., BA, AB, BS)  Occupational History   Not on file  Tobacco Use   Smoking status: Every Day    Current packs/day: 0.35    Types: Cigarettes   Smokeless tobacco: Never  Vaping Use   Vaping status: Never Used  Substance and Sexual Activity   Alcohol use: Not Currently    Comment: social   Drug use: No   Sexual activity: Yes    Partners: Male    Birth control/protection: Surgical    Comment: HYST  Other Topics Concern   Not on file  Social History Narrative   [facility]   No pets       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    Social Drivers of Corporate investment banker Strain: Low Risk  (05/18/2024)   Overall Financial Resource Strain (CARDIA)    Difficulty of Paying Living Expenses: Not very hard  Food Insecurity: Unknown (06/18/2024)   Hunger Vital Sign    Worried About Running Out of Food in the Last Year: Not on file    Ran Out of Food in the Last Year: Never true  Transportation Needs: No Transportation Needs (06/18/2024)   PRAPARE - Administrator, Civil Service (Medical): No    Lack of Transportation (Non-Medical): No  Physical Activity: Sufficiently Active (05/18/2024)   Exercise Vital Sign    Days of Exercise per Week: 2 days    Minutes of Exercise per  Session: 120 min  Stress: No Stress Concern Present (05/18/2024)   [employer] of Occupational Health - Occupational Stress Questionnaire    Feeling of Stress: Only a little  Social Connections: Moderately Integrated (06/18/2024)   Social Connection and Isolation Panel    Frequency of Communication with Friends and Family: More than three times a week    Frequency of Social Gatherings with Friends and Family: Once a week    Attends Religious Services: More than 4 times per year    Active Member of [employer] or Organizations: Yes    Attends Engineer, structural: More than 4 times per year    Marital Status: Divorced  Intimate Partner Violence: Not At Risk (06/18/2024)   Humiliation, Afraid, Rape, and Kick questionnaire    Fear of Current or Ex-Partner: No    Emotionally Abused: No    Physically Abused: No    Sexually Abused: No    Family History  Problem Relation Age of Onset   Stroke Mother    Diabetes Mother    Diabetes Father    Colon polyps Sister    Hypertension Sister    Cancer Sister    Esophageal cancer Paternal Grandfather 23   Colon cancer Neg Hx    Stomach cancer Neg Hx    Rectal cancer Neg Hx    Breast cancer Neg Hx    BRCA 1/2 Neg Hx     Current Outpatient Medications on File Prior to Visit  Medication Sig Dispense Refill   albuterol  (VENTOLIN  HFA) 108 (90 Base) MCG/ACT inhaler Inhale 1-2 puffs into the lungs every 6 (six) hours as needed for wheezing or shortness of breath. 3 each 1   amLODipine  (NORVASC ) 5 MG tablet TAKE 1 TABLET EVERY DAY 90 tablet 3   Ascorbic Acid (VITAMIN C) 500 MG CAPS Take 500 mg by mouth daily.     cetirizine  (ZYRTEC ) 10 MG tablet Take 10 mg by mouth daily as needed for allergies.     Cholecalciferol (VITAMIN D -3) 125 MCG (5000 UT) TABS Take 5,000 Units by mouth every other day.     CINNAMON PO Take 1 capsule by mouth daily.     desonide  (DESOWEN ) 0.05 % cream Apply 1 Application topically daily as needed (irritation).      diclofenac Sodium (VOLTAREN) 1 % GEL Apply 2 g topically daily as needed (pain).     docusate sodium  (COLACE) 100 MG capsule Take 1 capsule (100 mg total) by mouth every 12 (twelve) hours. (Patient not taking: Reported on 07/02/2024) 30 capsule 0   EPINEPHrine  (EPIPEN  2-PAK) 0.3 mg/0.3 mL IJ SOAJ injection Inject 0.3 mg into the muscle as needed for anaphylaxis. 1 each 1   fexofenadine (ALLEGRA) 180 MG  tablet Take 180 mg by mouth daily as needed for allergies.     fluticasone  (FLONASE ) 50 MCG/ACT nasal spray USE 2 SPRAY(S) IN EACH NOSTRIL ONCE DAILY AT  NIGHT (Patient taking differently: Place 2 sprays into both nostrils daily as needed for allergies.) 16 g 6   ipratropium (ATROVENT) 0.06 % nasal spray Place 2 sprays into both nostrils in the morning and at bedtime.     lidocaine  (LIDODERM ) 5 % Place 1 patch onto the skin daily. Remove & Discard patch within 12 hours or as directed by MD (Patient taking differently: Place 1 patch onto the skin daily as needed (pain). Remove & Discard patch within 12 hours or as directed by MD) 30 patch 0   Multiple Vitamin (MULTIVITAMIN) tablet Take 1 tablet by mouth daily.     ondansetron  (ZOFRAN ) 4 MG tablet Take 4 mg by mouth every 8 (eight) hours as needed for vomiting or nausea.     triamcinolone  ointment (KENALOG ) 0.1 % Apply small amt twice per day x 14 days then twice weekly 30 g 1   No current facility-administered medications on file prior to visit.    Allergies  Allergen Reactions   Bee Venom Hives   Prednisone  Other (See Comments)    hallucinations   Beef-Derived Drug Products     Doesn't eat any type of red meat       Physical Exam Vitals requested from patient and listed below if patient had equipment and was able to obtain at home for this virtual visit: There were no vitals filed for this visit. Estimated body mass index is 31.14 kg/m as calculated from the following:   Height as of 06/18/24: 5' 5.5 (1.664 m).   Weight as of 06/18/24: 190  lb (86.2 kg).  EKG (optional): deferred due to virtual visit  GENERAL: alert, oriented, no acute distress detected, full vision exam deferred due to pandemic and/or virtual encounter   HEENT: atraumatic, conjunttiva clear, no obvious abnormalities on inspection of external nose and ears  NECK: normal movements of the head and neck  LUNGS: on inspection no signs of respiratory distress, breathing rate appears normal, no obvious gross SOB, gasping or wheezing  CV: no obvious cyanosis  MS: moves all visible extremities without noticeable abnormality  PSYCH/NEURO: pleasant and cooperative, no obvious depression or anxiety, speech and thought processing grossly intact, Cognitive function grossly intact  Flowsheet Row Office Visit from 10/18/2023 in [facility] [place]  PHQ-9 Total Score 9        08/06/2024    5:59 PM 10/18/2023    9:39 AM 07/06/2023    4:12 PM 10/10/2022   10:12 AM 07/04/2022    9:36 AM  Depression screen PHQ 2/9  Decreased Interest 0 1 0 1 0  Down, Depressed, Hopeless 0 1 0 1 0  PHQ - 2 Score 0 2 0 2 0  Altered sleeping  2 0 2   Tired, decreased energy  2 0 1   Change in appetite  2 0 2   Feeling bad or failure about yourself   0 0 1   Trouble concentrating  0 0 1   Moving slowly or fidgety/restless  1 0 1   Suicidal thoughts  0 0 0   PHQ-9 Score  9 0 10   Difficult doing work/chores  Not difficult at all Not difficult at all Somewhat difficult        07/06/2023    4:15 PM 10/18/2023  9:38 AM 01/16/2024    8:52 PM 08/02/2024    2:54 PM 08/06/2024    5:58 PM  Fall Risk  Falls in the past year? 0 0 0 1 0  Was there an injury with Fall? 0 0  0 0  Fall Risk Category Calculator 0 0  1  0  Patient at Risk for Falls Due to No Fall Risks No Fall Risks     Fall risk Follow up Falls evaluation completed Falls evaluation completed   Falls evaluation completed     Patient-reported     SUMMARY AND PLAN:  Encounter for Medicare annual  wellness exam  Discussed applicable health maintenance/preventive health measures and advised and referred or ordered per patient preferences: -discussed vaccines due recs and risks, she plans to do at the pharmacy, advised to let us  know when she does so that we can update her vaccine record Health Maintenance  Topic Date Due   Influenza Vaccine  06/14/2024   COVID-19 Vaccine (8 - Pfizer risk 2024-25 season) 09/16/2024   Mammogram  03/07/2025   Medicare Annual Wellness (AWV)  08/06/2025   DTaP/Tdap/Td (3 - Td or Tdap) 01/26/2028   Colonoscopy  11/20/2028   Pneumococcal Vaccine: 50+ Years  Completed   DEXA SCAN  Completed   Hepatitis C Screening  Completed   Zoster Vaccines- Shingrix  Completed   HPV VACCINES  Aged Out   Meningococcal B Vaccine  Aged Out   Hepatitis B Vaccines 19-59 Average Risk  Discontinued      Education and counseling on the following was provided based on the above review of health and a plan/checklist for the patient, along with additional information discussed, was provided for the patient in the patient instructions :   -Advised and counseled on a healthy lifestyle - including the importance of a healthy diet, regular physical activity, social connections and stress management. -Reviewed patient's current diet. Advised and counseled on a whole foods based healthy diet. A summary of a healthy diet was provided in the Patient Instructions.  -reviewed patient's current physical activity level and discussed exercise guidelines for adults. Discussed community resources and ideas for safe exercise at home to assist in meeting exercise guideline recommendations in a safe and healthy way.  -Advise yearly dental visits at minimum and regular eye exams -Advised and counseled on alcohol safe limits, risks/ tobacco use, risks of smoking and offered counseling/help - she is not interested in quitting fully currently. She is not sure of barrier. She is aware of risks. She  plans to continue to cut back. Discussed strategies, help, etc.   Follow up: see patient instructions     Patient Instructions  I really enjoyed getting to talk with you today! I am available on Tuesdays and Thursdays for virtual visits if you have any questions or concerns, or if I can be of any further assistance.   CHECKLIST FROM ANNUAL WELLNESS VISIT:  -Follow up (please call to schedule if not scheduled after visit):   -yearly for annual wellness visit with primary care office  Here is a list of your preventive care/health maintenance measures and the plan for each if any are due:  PLAN For any measures below that may be due:    1. Can get vaccines at the pharmacy. Please let us  know when you do so that we can update your record.  Health Maintenance  Topic Date Due   Influenza Vaccine  06/14/2024   COVID-19 Vaccine (8 - Pfizer risk 2024-25  season) 09/16/2024   Mammogram  03/07/2025   Medicare Annual Wellness (AWV)  08/06/2025   DTaP/Tdap/Td (3 - Td or Tdap) 01/26/2028   Colonoscopy  11/20/2028   Pneumococcal Vaccine: 50+ Years  Completed   DEXA SCAN  Completed   Hepatitis C Screening  Completed   Zoster Vaccines- Shingrix  Completed   HPV VACCINES  Aged Out   Meningococcal B Vaccine  Aged Out   Hepatitis B Vaccines 19-59 Average Risk  Discontinued    -See a dentist at least yearly  -Get your eyes checked and then per your eye specialist's recommendations  -Other issues addressed today:   1. Please quit smoking. Let us  know if you need any help.   -I have included below further information regarding a healthy whole foods based diet, physical activity guidelines for adults, stress management and opportunities for social connections. I hope you find this information useful.    -----------------------------------------------------------------------------------------------------------------------------------------------------------------------------------------------------------------------------------------------------------    NUTRITION: -eat real food: lots of colorful vegetables (half the plate) and fruits -5-7 servings of vegetables and fruits per day (fresh or steamed is best), exp. 2 servings of vegetables with lunch and dinner and 2 servings of fruit per day. Berries and greens such as kale and collards are great choices.  -consume on a regular basis:  fresh fruits, fresh veggies, fish, nuts, seeds, healthy oils (such as olive oil, avocado oil), whole grains (make sure for bread/pasta/crackers/etc., that the first ingredient on label contains the word whole), legumes. -can eat small amounts of dairy and lean meat (no larger than the palm of your hand), but avoid processed meats such as ham, bacon, lunch meat, etc. -drink water  -try to avoid fast food and pre-packaged foods, processed meat, ultra processed foods/beverages (donuts, candy, etc.) -most experts advise limiting sodium to < 2300mg  per day, should limit further is any chronic conditions such as high blood pressure, heart disease, diabetes, etc. The American Heart Association advised that < 1500mg  is is ideal -try to avoid foods/beverages that contain any ingredients with names you do not recognize  -try to avoid foods/beverages  with added sugar or sweeteners/sweets  -try to avoid sweet drinks (including diet drinks): soda, juice, Gatorade, sweet tea, power drinks, diet drinks -try to avoid white rice, white bread, pasta (unless whole grain)  EXERCISE GUIDELINES FOR ADULTS: -if you wish to increase your physical activity, do so gradually and with the approval of your doctor -STOP and seek medical care immediately if you have any chest pain, chest discomfort or trouble breathing when starting or  increasing exercise  -move and stretch your body, legs, feet and arms when sitting for long periods -Physical activity guidelines for optimal health in adults: -get at least 150 minutes per week of moderate exercise (can talk, but not sing); this is about 20-30 minutes of sustained activity 5-7 days per week or two 10-15 minute episodes of sustained activity 5-7 days per week -do some muscle building/resistance training/strength training at least 2 days per week  -balance exercises 3+ days per week:   Stand somewhere where you have something sturdy to hold onto if you lose balance    1) lift up on toes, then back down, start with 5x per day and work up to 20x   2) stand and lift one leg straight out to the side so that foot is a few inches of the floor, start with 5x each side and work up to 20x each side   3) stand on one foot, start with  5 seconds each side and work up to 20 seconds on each side  If you need ideas or help with getting more active:  -Silver sneakers https://tools.silversneakers.com  -Walk with a Doc: http://www.duncan-williams.com/  -try to include resistance (weight lifting/strength building) and balance exercises twice per week: or the following link for ideas: http://castillo-powell.com/  BuyDucts.dk  STRESS MANAGEMENT: -can try meditating, or just sitting quietly with deep breathing while intentionally relaxing all parts of your body for 5 minutes daily -if you need further help with stress, anxiety or depression please follow up with your primary doctor or contact the wonderful folks at [facility]: [phone]  SOCIAL CONNECTIONS: -options in [place] if you wish to engage in more social and exercise related activities:  -Silver sneakers https://tools.silversneakers.com  -Walk with a Doc: http://www.duncan-williams.com/  -Check out the [facility] Active Adults 50+  section on the [place] of [employer] (hiking clubs, book clubs, cards and games, chess, exercise classes, aquatic classes and much more) - see the website for details: https://www.[place].gov/departments/parks-recreation/active-adults50  -YouTube has lots of exercise videos for different ages and abilities as well  -[facility] (a variety of indoor and outdoor inperson activities for adults). [phone]. [address]  -Virtual Online Classes (a variety of topics): see seniorplanet.org or call [phone]  -consider volunteering at a school, hospice center, church, senior center or elsewhere            Chiquita JONELLE Cramp, DO

## 2024-08-12 ENCOUNTER — Telehealth: Payer: Self-pay | Admitting: Internal Medicine

## 2024-08-12 NOTE — Telephone Encounter (Unsigned)
 Copied from CRM [id number]. Topic: General - Other >> Aug 12, 2024  9:46 AM Donee H wrote: Reason for CRM: Patient called stating she needs to have some of her medical information updated on chart. She stated she had her RSV vaccine done in August of 2024. It doesn't show in her records. She did not give the actual day but states it was done in Aug. She would like for this to be updated in her records.

## 2024-08-15 NOTE — Telephone Encounter (Signed)
 It is updated! Pt had RSV on 06/28/2023.

## 2024-08-26 DIAGNOSIS — H269 Unspecified cataract: Secondary | ICD-10-CM | POA: Diagnosis not present

## 2024-08-26 DIAGNOSIS — I10 Essential (primary) hypertension: Secondary | ICD-10-CM | POA: Diagnosis not present

## 2024-08-26 DIAGNOSIS — M519 Unspecified thoracic, thoracolumbar and lumbosacral intervertebral disc disorder: Secondary | ICD-10-CM | POA: Diagnosis not present

## 2024-08-26 DIAGNOSIS — Z833 Family history of diabetes mellitus: Secondary | ICD-10-CM | POA: Diagnosis not present

## 2024-08-26 DIAGNOSIS — E669 Obesity, unspecified: Secondary | ICD-10-CM | POA: Diagnosis not present

## 2024-08-26 DIAGNOSIS — I700 Atherosclerosis of aorta: Secondary | ICD-10-CM | POA: Diagnosis not present

## 2024-08-26 DIAGNOSIS — M545 Low back pain, unspecified: Secondary | ICD-10-CM | POA: Diagnosis not present

## 2024-08-26 DIAGNOSIS — Z8249 Family history of ischemic heart disease and other diseases of the circulatory system: Secondary | ICD-10-CM | POA: Diagnosis not present

## 2024-08-26 DIAGNOSIS — M1990 Unspecified osteoarthritis, unspecified site: Secondary | ICD-10-CM | POA: Diagnosis not present

## 2024-08-26 DIAGNOSIS — J45909 Unspecified asthma, uncomplicated: Secondary | ICD-10-CM | POA: Diagnosis not present

## 2024-08-26 DIAGNOSIS — R739 Hyperglycemia, unspecified: Secondary | ICD-10-CM | POA: Diagnosis not present

## 2024-08-26 DIAGNOSIS — N959 Unspecified menopausal and perimenopausal disorder: Secondary | ICD-10-CM | POA: Diagnosis not present

## 2024-08-26 DIAGNOSIS — F419 Anxiety disorder, unspecified: Secondary | ICD-10-CM | POA: Diagnosis not present

## 2024-08-28 DIAGNOSIS — N83201 Unspecified ovarian cyst, right side: Secondary | ICD-10-CM | POA: Diagnosis not present

## 2024-10-15 ENCOUNTER — Ambulatory Visit: Admitting: Podiatry

## 2024-10-15 DIAGNOSIS — M79675 Pain in left toe(s): Secondary | ICD-10-CM | POA: Diagnosis not present

## 2024-10-15 DIAGNOSIS — M79674 Pain in right toe(s): Secondary | ICD-10-CM | POA: Diagnosis not present

## 2024-10-15 DIAGNOSIS — B351 Tinea unguium: Secondary | ICD-10-CM

## 2024-10-20 ENCOUNTER — Encounter: Payer: Self-pay | Admitting: Podiatry

## 2024-10-20 NOTE — Progress Notes (Signed)
  Subjective:  Patient ID: Debra Woodward, female    DOB: 1951-01-15,  MRN: [id number]  Debra Woodward presents to clinic today for painful mycotic toenails x 10 which interfere with daily activities. Pain is relieved with periodic professional debridement.  Chief Complaint  Patient presents with   Nail Problem    Thick painful toenails, 3 month follow up    New problem(s): None.   PCP is Panosh, Apolinar POUR, MD. [place] 05/21/24.  Allergies  Allergen Reactions   Bee Venom Hives   Prednisone  Other (See Comments)    hallucinations   Bovine (Beef) Protein-Containing Drug Products     Doesn't eat any type of red meat    Review of Systems: Negative except as noted in the HPI.  Objective: No changes noted in today's physical examination. There were no vitals filed for this visit. Debra Woodward is a pleasant 73 y.o. female in NAD. AAO x 3.  Vascular Examination: Capillary refill time immediate b/l. Palpable pedal pulses. Pedal hair present b/l. Pedal edema absent. No pain with calf compression b/l. Skin temperature gradient WNL b/l. No cyanosis or clubbing b/l. No ischemia or gangrene noted b/l.   Neurological Examination: Sensation grossly intact b/l with 10 gram monofilament.   Dermatological Examination: Pedal skin with normal turgor, texture and tone b/l.  No open wounds. No interdigital macerations.   Toenails 1-5 b/l thick, discolored, elongated with subungual debris and pain on dorsal palpation.   No hyperkeratotic nor porokeratotic lesions.  Musculoskeletal Examination: Muscle strength 5/5 to all lower extremity muscle groups bilaterally. No pain, crepitus or joint limitation noted with ROM bilateral LE. No gross bony deformities bilaterally.  Radiographs: None  Assessment/Plan: 1. Pain due to onychomycosis of toenails of both feet     Patient was evaluated and treated. All patient's and/or POA's questions/concerns addressed on today's visit. Toenails 1-5 b/l debrided in  length and girth without incident. Continue foot and shoe inspections daily. Monitor blood glucose per PCP/Endocrinologist's recommendations. Continue soft, supportive shoe gear daily. Report any pedal injuries to medical professional. Call office if there are any questions/concerns. -Patient/POA to call should there be question/concern in the interim.   Return in about 3 months (around 01/13/2025).  Delon LITTIE Merlin, DPM      [place] LOCATION: [address], [place] [address]                   Office [phone]   [facility] LOCATION: [address] [place], [place] [address] Office [phone]

## 2024-10-22 DIAGNOSIS — H6123 Impacted cerumen, bilateral: Secondary | ICD-10-CM | POA: Diagnosis not present

## 2024-11-27 LAB — OPHTHALMOLOGY REPORT-SCANNED

## 2025-02-04 ENCOUNTER — Ambulatory Visit: Admitting: Podiatry
